# Patient Record
Sex: Male | Born: 1945
Health system: Southern US, Community
[De-identification: ages and names within clinical notes are randomized; demographics above are authoritative.]

## PROBLEM LIST (undated history)

## (undated) DIAGNOSIS — M1990 Unspecified osteoarthritis, unspecified site: Secondary | ICD-10-CM

## (undated) DIAGNOSIS — G8929 Other chronic pain: Secondary | ICD-10-CM

## (undated) DIAGNOSIS — R7303 Prediabetes: Secondary | ICD-10-CM

## (undated) DIAGNOSIS — K3189 Other diseases of stomach and duodenum: Secondary | ICD-10-CM

## (undated) DIAGNOSIS — M542 Cervicalgia: Secondary | ICD-10-CM

## (undated) DIAGNOSIS — J449 Chronic obstructive pulmonary disease, unspecified: Secondary | ICD-10-CM

## (undated) DIAGNOSIS — E785 Hyperlipidemia, unspecified: Secondary | ICD-10-CM

## (undated) DIAGNOSIS — Z87891 Personal history of nicotine dependence: Secondary | ICD-10-CM

## (undated) DIAGNOSIS — K52831 Collagenous colitis: Secondary | ICD-10-CM

## (undated) DIAGNOSIS — K760 Fatty (change of) liver, not elsewhere classified: Secondary | ICD-10-CM

## (undated) DIAGNOSIS — K449 Diaphragmatic hernia without obstruction or gangrene: Secondary | ICD-10-CM

## (undated) DIAGNOSIS — I10 Essential (primary) hypertension: Secondary | ICD-10-CM

## (undated) DIAGNOSIS — I2510 Atherosclerotic heart disease of native coronary artery without angina pectoris: Secondary | ICD-10-CM

## (undated) DIAGNOSIS — IMO0002 Reserved for concepts with insufficient information to code with codable children: Secondary | ICD-10-CM

## (undated) DIAGNOSIS — M25512 Pain in left shoulder: Secondary | ICD-10-CM

## (undated) DIAGNOSIS — E669 Obesity, unspecified: Secondary | ICD-10-CM

## (undated) DIAGNOSIS — K219 Gastro-esophageal reflux disease without esophagitis: Secondary | ICD-10-CM

## (undated) DIAGNOSIS — E66812 Obesity, class 2: Secondary | ICD-10-CM

## (undated) HISTORY — DX: Fatty (change of) liver, not elsewhere classified: K76.0

## (undated) HISTORY — DX: Diaphragmatic hernia without obstruction or gangrene: K44.9

## (undated) HISTORY — DX: Essential (primary) hypertension: I10

## (undated) HISTORY — DX: Obesity, class 2: E66.812

## (undated) HISTORY — DX: Atherosclerotic heart disease of native coronary artery without angina pectoris: I25.10

## (undated) HISTORY — DX: Pain in left shoulder: M25.512

## (undated) HISTORY — PX: COLONOSCOPY W/ POLYPECTOMY: SHX1380

## (undated) HISTORY — DX: Personal history of nicotine dependence: Z87.891

## (undated) HISTORY — DX: Other chronic pain: G89.29

## (undated) HISTORY — DX: Collagenous colitis: K52.831

## (undated) HISTORY — DX: Gastro-esophageal reflux disease without esophagitis: K21.9

## (undated) HISTORY — PX: CARPAL TUNNEL RELEASE: SHX101

## (undated) HISTORY — DX: Prediabetes: R73.03

## (undated) HISTORY — DX: Reserved for concepts with insufficient information to code with codable children: IMO0002

## (undated) HISTORY — DX: Chronic obstructive pulmonary disease, unspecified: J44.9

## (undated) HISTORY — PX: TONSILLECTOMY: SHX5217

## (undated) HISTORY — DX: Hyperlipidemia, unspecified: E78.5

## (undated) HISTORY — PX: CATARACT EXTRACTION W/ INTRAOCULAR LENS IMPLANT: SHX1309

## (undated) HISTORY — DX: Obesity, unspecified: E66.9

---

## 1999-11-21 ENCOUNTER — Ambulatory Visit (HOSPITAL_COMMUNITY): Admission: RE | Admit: 1999-11-21 | Discharge: 1999-11-21 | Payer: Self-pay | Admitting: *Deleted

## 2000-10-21 DIAGNOSIS — K52831 Collagenous colitis: Secondary | ICD-10-CM

## 2000-10-21 HISTORY — DX: Collagenous colitis: K52.831

## 2002-02-23 ENCOUNTER — Encounter: Payer: Self-pay | Admitting: Emergency Medicine

## 2002-02-23 ENCOUNTER — Emergency Department (HOSPITAL_COMMUNITY): Admission: EM | Admit: 2002-02-23 | Discharge: 2002-02-23 | Payer: Self-pay | Admitting: Emergency Medicine

## 2005-08-30 ENCOUNTER — Ambulatory Visit: Payer: Self-pay | Admitting: Internal Medicine

## 2005-09-05 ENCOUNTER — Ambulatory Visit: Payer: Self-pay | Admitting: Internal Medicine

## 2006-04-22 ENCOUNTER — Ambulatory Visit: Payer: Self-pay | Admitting: Internal Medicine

## 2007-02-03 ENCOUNTER — Ambulatory Visit: Payer: Self-pay | Admitting: Internal Medicine

## 2007-02-03 LAB — CONVERTED CEMR LAB
ALT: 16 units/L (ref 0–40)
AST: 15 units/L (ref 0–37)
Albumin: 3.9 g/dL (ref 3.5–5.2)
Alkaline Phosphatase: 63 units/L (ref 39–117)
BUN: 23 mg/dL (ref 6–23)
Basophils Absolute: 0 10*3/uL (ref 0.0–0.1)
Basophils Relative: 0.6 % (ref 0.0–1.0)
Bilirubin Urine: NEGATIVE
Bilirubin, Direct: 0.1 mg/dL (ref 0.0–0.3)
CO2: 31 meq/L (ref 19–32)
Calcium: 9.3 mg/dL (ref 8.4–10.5)
Chloride: 104 meq/L (ref 96–112)
Cholesterol: 219 mg/dL (ref 0–200)
Creatinine, Ser: 0.9 mg/dL (ref 0.4–1.5)
Direct LDL: 161.2 mg/dL
Eosinophils Absolute: 0.1 10*3/uL (ref 0.0–0.6)
Eosinophils Relative: 1.8 % (ref 0.0–5.0)
GFR calc Af Amer: 111 mL/min
GFR calc non Af Amer: 92 mL/min
Glucose, Bld: 93 mg/dL (ref 70–99)
HCT: 42.6 % (ref 39.0–52.0)
HDL: 39.8 mg/dL (ref >39.0)
Hemoglobin, Urine: NEGATIVE
Hemoglobin: 14.6 g/dL (ref 13.0–17.0)
Ketones, ur: NEGATIVE mg/dL
Leukocytes, UA: NEGATIVE
Lymphocytes Relative: 26.7 % (ref 12.0–46.0)
MCHC: 34.2 g/dL (ref 30.0–36.0)
MCV: 85.9 fL (ref 78.0–100.0)
Monocytes Absolute: 0.4 10*3/uL (ref 0.2–0.7)
Monocytes Relative: 6.3 % (ref 3.0–11.0)
Neutro Abs: 4.4 10*3/uL (ref 1.4–7.7)
Neutrophils Relative %: 64.6 % (ref 43.0–77.0)
Nitrite: NEGATIVE
PSA: 0.6 ng/mL (ref 0.10–4.00)
Platelets: 252 10*3/uL (ref 150–400)
Potassium: 4.4 meq/L (ref 3.5–5.1)
RBC: 4.96 M/uL (ref 4.22–5.81)
RDW: 13.2 % (ref 11.5–14.6)
Sodium: 139 meq/L (ref 135–145)
Specific Gravity, Urine: >=1.03 (ref 1.000–1.03)
TSH: 1.58 microintl units/mL (ref 0.35–5.50)
Total Bilirubin: 0.5 mg/dL (ref 0.3–1.2)
Total CHOL/HDL Ratio: 5.5
Total Protein, Urine: NEGATIVE mg/dL
Total Protein: 7 g/dL (ref 6.0–8.3)
Triglycerides: 153 mg/dL — ABNORMAL HIGH (ref 0–149)
Urine Glucose: NEGATIVE mg/dL
Urobilinogen, UA: 0.2 (ref 0.0–1.0)
VLDL: 31 mg/dL (ref 0–40)
WBC: 6.7 10*3/uL (ref 4.5–10.5)
pH: 5.5 (ref 5.0–8.0)

## 2007-08-18 ENCOUNTER — Ambulatory Visit: Payer: Self-pay | Admitting: Internal Medicine

## 2007-08-18 LAB — CONVERTED CEMR LAB
ALT: 21 units/L (ref 0–53)
AST: 19 units/L (ref 0–37)
Albumin: 4.3 g/dL (ref 3.5–5.2)
Alkaline Phosphatase: 58 units/L (ref 39–117)
BUN: 18 mg/dL (ref 6–23)
Basophils Absolute: 0 10*3/uL (ref 0.0–0.1)
Basophils Relative: 0.6 % (ref 0.0–1.0)
Bilirubin Urine: NEGATIVE
Bilirubin, Direct: 0.1 mg/dL (ref 0.0–0.3)
CO2: 31 meq/L (ref 19–32)
Calcium: 9.5 mg/dL (ref 8.4–10.5)
Chloride: 102 meq/L (ref 96–112)
Cholesterol: 173 mg/dL (ref 0–200)
Creatinine, Ser: 0.8 mg/dL (ref 0.4–1.5)
Eosinophils Absolute: 0.1 10*3/uL (ref 0.0–0.6)
Eosinophils Relative: 2.4 % (ref 0.0–5.0)
GFR calc Af Amer: 128 mL/min
GFR calc non Af Amer: 106 mL/min
Glucose, Bld: 86 mg/dL (ref 70–99)
HCT: 43.8 % (ref 39.0–52.0)
HDL: 46.3 mg/dL (ref >39.0)
Hemoglobin, Urine: NEGATIVE
Hemoglobin: 15.2 g/dL (ref 13.0–17.0)
Ketones, ur: NEGATIVE mg/dL
LDL Cholesterol: 105 mg/dL — ABNORMAL HIGH (ref 0–99)
Leukocytes, UA: NEGATIVE
Lymphocytes Relative: 31.7 % (ref 12.0–46.0)
MCHC: 34.7 g/dL (ref 30.0–36.0)
MCV: 88.1 fL (ref 78.0–100.0)
Monocytes Absolute: 0.5 10*3/uL (ref 0.2–0.7)
Monocytes Relative: 8 % (ref 3.0–11.0)
Neutro Abs: 3.6 10*3/uL (ref 1.4–7.7)
Neutrophils Relative %: 57.3 % (ref 43.0–77.0)
Nitrite: NEGATIVE
PSA: 0.54 ng/mL (ref 0.10–4.00)
Platelets: 265 10*3/uL (ref 150–400)
Potassium: 4.7 meq/L (ref 3.5–5.1)
RBC: 4.98 M/uL (ref 4.22–5.81)
RDW: 13.2 % (ref 11.5–14.6)
Sodium: 138 meq/L (ref 135–145)
Specific Gravity, Urine: >=1.03 (ref 1.000–1.03)
TSH: 1.55 microintl units/mL (ref 0.35–5.50)
Total Bilirubin: 0.6 mg/dL (ref 0.3–1.2)
Total CHOL/HDL Ratio: 3.7
Total Protein, Urine: NEGATIVE mg/dL
Total Protein: 7.4 g/dL (ref 6.0–8.3)
Triglycerides: 109 mg/dL (ref 0–149)
Urine Glucose: NEGATIVE mg/dL
Urobilinogen, UA: 0.2 (ref 0.0–1.0)
VLDL: 22 mg/dL (ref 0–40)
WBC: 6.2 10*3/uL (ref 4.5–10.5)
pH: 5.5 (ref 5.0–8.0)

## 2007-08-21 ENCOUNTER — Ambulatory Visit: Payer: Self-pay | Admitting: Internal Medicine

## 2007-08-21 ENCOUNTER — Encounter: Payer: Self-pay | Admitting: Internal Medicine

## 2007-08-21 DIAGNOSIS — K219 Gastro-esophageal reflux disease without esophagitis: Secondary | ICD-10-CM | POA: Insufficient documentation

## 2007-08-21 DIAGNOSIS — I2510 Atherosclerotic heart disease of native coronary artery without angina pectoris: Secondary | ICD-10-CM | POA: Insufficient documentation

## 2007-08-21 DIAGNOSIS — I10 Essential (primary) hypertension: Secondary | ICD-10-CM | POA: Insufficient documentation

## 2007-08-21 DIAGNOSIS — E785 Hyperlipidemia, unspecified: Secondary | ICD-10-CM | POA: Insufficient documentation

## 2007-08-21 HISTORY — DX: Gastro-esophageal reflux disease without esophagitis: K21.9

## 2007-10-21 ENCOUNTER — Ambulatory Visit: Payer: Self-pay | Admitting: Internal Medicine

## 2009-03-23 ENCOUNTER — Encounter: Payer: Self-pay | Admitting: Internal Medicine

## 2009-03-24 ENCOUNTER — Ambulatory Visit: Payer: Self-pay | Admitting: Internal Medicine

## 2009-03-25 LAB — CONVERTED CEMR LAB
ALT: 30 units/L (ref 0–53)
AST: 25 units/L (ref 0–37)
Albumin: 4.1 g/dL (ref 3.5–5.2)
Alkaline Phosphatase: 82 units/L (ref 39–117)
BUN: 18 mg/dL (ref 6–23)
Basophils Absolute: 0.1 10*3/uL (ref 0.0–0.1)
Basophils Relative: 0.7 % (ref 0.0–3.0)
Bilirubin Urine: NEGATIVE
Bilirubin, Direct: 0.1 mg/dL (ref 0.0–0.3)
CO2: 31 meq/L (ref 19–32)
Calcium: 9.1 mg/dL (ref 8.4–10.5)
Chloride: 109 meq/L (ref 96–112)
Cholesterol: 178 mg/dL (ref 0–200)
Creatinine, Ser: 0.9 mg/dL (ref 0.4–1.5)
Eosinophils Absolute: 0.1 10*3/uL (ref 0.0–0.7)
Eosinophils Relative: 1.7 % (ref 0.0–5.0)
GFR calc non Af Amer: 91.32 mL/min (ref >60)
Glucose, Bld: 92 mg/dL (ref 70–99)
HCT: 40.6 % (ref 39.0–52.0)
HDL: 46.2 mg/dL (ref >39.00)
Hemoglobin, Urine: NEGATIVE
Hemoglobin: 14.4 g/dL (ref 13.0–17.0)
Ketones, ur: NEGATIVE mg/dL
LDL Cholesterol: 106 mg/dL — ABNORMAL HIGH (ref 0–99)
Leukocytes, UA: NEGATIVE
Lymphocytes Relative: 26.3 % (ref 12.0–46.0)
Lymphs Abs: 2.2 10*3/uL (ref 0.7–4.0)
MCHC: 35.6 g/dL (ref 30.0–36.0)
MCV: 85.7 fL (ref 78.0–100.0)
Monocytes Absolute: 0.8 10*3/uL (ref 0.1–1.0)
Monocytes Relative: 10.2 % (ref 3.0–12.0)
Neutro Abs: 5 10*3/uL (ref 1.4–7.7)
Neutrophils Relative %: 61.1 % (ref 43.0–77.0)
Nitrite: NEGATIVE
PSA: 0.52 ng/mL (ref 0.10–4.00)
Platelets: 209 10*3/uL (ref 150.0–400.0)
Potassium: 4.4 meq/L (ref 3.5–5.1)
RBC: 4.73 M/uL (ref 4.22–5.81)
RDW: 13.1 % (ref 11.5–14.6)
Sodium: 142 meq/L (ref 135–145)
Specific Gravity, Urine: >=1.03 (ref 1.000–1.030)
TSH: 1.11 microintl units/mL (ref 0.35–5.50)
Total Bilirubin: 0.8 mg/dL (ref 0.3–1.2)
Total CHOL/HDL Ratio: 4
Total Protein, Urine: NEGATIVE mg/dL
Total Protein: 7.5 g/dL (ref 6.0–8.3)
Triglycerides: 127 mg/dL (ref 0.0–149.0)
Urine Glucose: NEGATIVE mg/dL
Urobilinogen, UA: 0.2 (ref 0.0–1.0)
VLDL: 25.4 mg/dL (ref 0.0–40.0)
WBC: 8.2 10*3/uL (ref 4.5–10.5)
pH: 5 (ref 5.0–8.0)

## 2009-04-06 ENCOUNTER — Telehealth (INDEPENDENT_AMBULATORY_CARE_PROVIDER_SITE_OTHER): Payer: Self-pay | Admitting: *Deleted

## 2009-04-10 ENCOUNTER — Ambulatory Visit: Payer: Self-pay

## 2009-04-10 ENCOUNTER — Encounter: Payer: Self-pay | Admitting: Internal Medicine

## 2009-06-22 ENCOUNTER — Telehealth: Payer: Self-pay | Admitting: Internal Medicine

## 2009-10-19 ENCOUNTER — Ambulatory Visit: Payer: Self-pay | Admitting: Internal Medicine

## 2009-10-21 HISTORY — PX: COLONOSCOPY W/ POLYPECTOMY: SHX1380

## 2010-04-13 ENCOUNTER — Ambulatory Visit: Payer: Self-pay | Admitting: Internal Medicine

## 2010-04-13 LAB — CONVERTED CEMR LAB
ALT: 18 units/L (ref 0–53)
AST: 17 units/L (ref 0–37)
Albumin: 4.3 g/dL (ref 3.5–5.2)
Alkaline Phosphatase: 76 units/L (ref 39–117)
BUN: 21 mg/dL (ref 6–23)
Basophils Absolute: 0 10*3/uL (ref 0.0–0.1)
Basophils Relative: 0.6 % (ref 0.0–3.0)
Bilirubin Urine: NEGATIVE
Bilirubin, Direct: 0.1 mg/dL (ref 0.0–0.3)
CO2: 31 meq/L (ref 19–32)
Calcium: 9.1 mg/dL (ref 8.4–10.5)
Chloride: 106 meq/L (ref 96–112)
Cholesterol: 233 mg/dL — ABNORMAL HIGH (ref 0–200)
Creatinine, Ser: 0.9 mg/dL (ref 0.4–1.5)
Direct LDL: 173 mg/dL
Eosinophils Absolute: 0.1 10*3/uL (ref 0.0–0.7)
Eosinophils Relative: 1.7 % (ref 0.0–5.0)
GFR calc non Af Amer: 93.39 mL/min (ref >60)
Glucose, Bld: 94 mg/dL (ref 70–99)
HCT: 42.5 % (ref 39.0–52.0)
HDL: 45 mg/dL (ref >39.00)
Hemoglobin, Urine: NEGATIVE
Hemoglobin: 14.7 g/dL (ref 13.0–17.0)
Ketones, ur: NEGATIVE mg/dL
Leukocytes, UA: NEGATIVE
Lymphocytes Relative: 26.1 % (ref 12.0–46.0)
Lymphs Abs: 1.8 10*3/uL (ref 0.7–4.0)
MCHC: 34.6 g/dL (ref 30.0–36.0)
MCV: 86.8 fL (ref 78.0–100.0)
Monocytes Absolute: 0.5 10*3/uL (ref 0.1–1.0)
Monocytes Relative: 7.3 % (ref 3.0–12.0)
Neutro Abs: 4.5 10*3/uL (ref 1.4–7.7)
Neutrophils Relative %: 64.3 % (ref 43.0–77.0)
Nitrite: NEGATIVE
PSA: 0.68 ng/mL (ref 0.10–4.00)
Platelets: 237 10*3/uL (ref 150.0–400.0)
Potassium: 4.7 meq/L (ref 3.5–5.1)
RBC: 4.89 M/uL (ref 4.22–5.81)
RDW: 14.3 % (ref 11.5–14.6)
Sodium: 141 meq/L (ref 135–145)
Specific Gravity, Urine: >=1.03 (ref 1.000–1.030)
TSH: 1 microintl units/mL (ref 0.35–5.50)
Total Bilirubin: 0.6 mg/dL (ref 0.3–1.2)
Total CHOL/HDL Ratio: 5
Total Protein, Urine: NEGATIVE mg/dL
Total Protein: 7.7 g/dL (ref 6.0–8.3)
Triglycerides: 233 mg/dL — ABNORMAL HIGH (ref 0.0–149.0)
Urine Glucose: NEGATIVE mg/dL
Urobilinogen, UA: 0.2 (ref 0.0–1.0)
VLDL: 46.6 mg/dL — ABNORMAL HIGH (ref 0.0–40.0)
WBC: 7 10*3/uL (ref 4.5–10.5)
pH: 5.5 (ref 5.0–8.0)

## 2010-04-19 ENCOUNTER — Ambulatory Visit: Payer: Self-pay | Admitting: Internal Medicine

## 2010-04-19 DIAGNOSIS — H9190 Unspecified hearing loss, unspecified ear: Secondary | ICD-10-CM | POA: Insufficient documentation

## 2010-04-25 ENCOUNTER — Encounter (INDEPENDENT_AMBULATORY_CARE_PROVIDER_SITE_OTHER): Payer: Self-pay | Admitting: *Deleted

## 2010-05-11 ENCOUNTER — Encounter (INDEPENDENT_AMBULATORY_CARE_PROVIDER_SITE_OTHER): Payer: Self-pay | Admitting: *Deleted

## 2010-05-14 ENCOUNTER — Ambulatory Visit: Payer: Self-pay | Admitting: Internal Medicine

## 2010-05-30 ENCOUNTER — Telehealth: Payer: Self-pay | Admitting: Internal Medicine

## 2010-05-31 ENCOUNTER — Ambulatory Visit: Payer: Self-pay | Admitting: Internal Medicine

## 2010-05-31 LAB — HM COLONOSCOPY

## 2010-06-04 ENCOUNTER — Encounter: Payer: Self-pay | Admitting: Internal Medicine

## 2010-09-21 ENCOUNTER — Encounter: Payer: Self-pay | Admitting: Internal Medicine

## 2010-09-21 ENCOUNTER — Ambulatory Visit: Payer: Self-pay | Admitting: Internal Medicine

## 2010-09-25 ENCOUNTER — Telehealth: Payer: Self-pay | Admitting: Internal Medicine

## 2010-11-08 ENCOUNTER — Telehealth: Payer: Self-pay | Admitting: Family Medicine

## 2010-11-19 ENCOUNTER — Other Ambulatory Visit: Payer: Self-pay | Admitting: Family Medicine

## 2010-11-19 ENCOUNTER — Telehealth: Payer: Self-pay | Admitting: Family Medicine

## 2010-11-19 ENCOUNTER — Encounter: Payer: Self-pay | Admitting: Family Medicine

## 2010-11-19 ENCOUNTER — Ambulatory Visit
Admission: RE | Admit: 2010-11-19 | Discharge: 2010-11-19 | Payer: Self-pay | Source: Home / Self Care | Attending: Family Medicine | Admitting: Family Medicine

## 2010-11-19 LAB — HEPATIC FUNCTION PANEL
ALT: 17 U/L (ref 0–53)
AST: 17 U/L (ref 0–37)
Alkaline Phosphatase: 66 U/L (ref 39–117)
Bilirubin, Direct: 0.1 mg/dL (ref 0.0–0.3)
Total Bilirubin: 0.4 mg/dL (ref 0.3–1.2)

## 2010-11-19 LAB — LIPID PANEL: VLDL: 33.6 mg/dL (ref 0.0–40.0)

## 2010-11-19 LAB — BASIC METABOLIC PANEL
BUN: 18 mg/dL (ref 6–23)
GFR: 90.82 mL/min (ref >60.00)
Glucose, Bld: 91 mg/dL (ref 70–99)
Potassium: 4.3 mEq/L (ref 3.5–5.1)

## 2010-11-20 NOTE — Miscellaneous (Signed)
Summary: screening colon age--ch.  Clinical Lists Changes  Medications: Added new medication of MOVIPREP 100 GM  SOLR (PEG-KCL-NACL-NASULF-NA ASC-C) As directed - Signed Rx of MOVIPREP 100 GM  SOLR (PEG-KCL-NACL-NASULF-NA ASC-C) As directed;  #1 x 0;  Signed;  Entered by: Clide Cliff RN;  Authorized by: Iva Boop MD, [facility];  Method used: [facility]  [place] [address], [place], [place]  [address], Ph: [phone] or [phone], Fax: [phone] Observations: Added new observation of ALLERGY REV: Done (05/14/2010 14:55)    Prescriptions: MOVIPREP 100 GM  SOLR (PEG-KCL-NACL-NASULF-NA ASC-C) As directed  #1 x 0   Entered by:   Clide Cliff RN   Authorized by:   Iva Boop MD, [facility]   Signed by:   Clide Cliff RN on 05/14/2010   Method used:   [facility]        [facility]  [place] [address] (retail)       [address]       [place], [place]  [address]       Ph: [phone] or [phone]       Fax: [phone]   RxID:   [id number]

## 2010-11-20 NOTE — Progress Notes (Signed)
Summary: ? re prep  Phone Note Call from Patient Call back at Home Phone [phone]   Caller: Patient Call For: Leone Payor Reason for Call: Talk to Nurse Summary of Call: Patient is having procedure tomorrow and has questions regarding prep Initial call taken by: Tawni Levy,  May 30, 2010 12:10 PM  Follow-up for Phone Call        Phone call from pt.  He can not pick up the prep until later today.  He asked if he could add ice to the prep after it is mixed.  He said he knows he will drink it better cold than luke warm.  I told him it would be okay.  To make sure he gets the proper amount of water in the prep.  Pt states he understands, no further questions. Follow-up by: Eual Fines RN,  May 30, 2010 12:22 PM

## 2010-11-20 NOTE — Assessment & Plan Note (Signed)
Summary: FU--BP: 160/90--FLUCUATED--145/80-D/T---STC   Vital Signs:  Patient profile:   65 year old male Height:      72 inches Weight:      252.25 pounds BMI:     34.33 O2 Sat:      96 % on Room air Temp:     99.2 degrees F oral Pulse rate:   65 / minute BP sitting:   128 / 72  (left arm) Cuff size:   large  Vitals Entered By: Zella Ball Ewing CMA Duncan Dull) (September 21, 2010 9:39 AM)  O2 Flow:  Room air  CC: BP Elevated/RE   CC:  BP Elevated/RE.  History of Present Illness: here to f/u - BP at home has been 140 to 170, bottom number 74 to 98 daily for months; Pt denies  worsening sob, doe, wheezing, orthopnea, pnd, worsening LE edema, palps, dizziness or syncope Pt denies new neuro symptoms such as headache, facial or extremity weakness  Pt denies polydipsia, polyuria  Overall good compliance with meds, trying to follow low chol, diet, wt stable, little excercise however   had to stop the ASA due to twice weekly nose bleed , but no fever, headache, sinus pain, pressure or other d/c. No ST , cough.   also not sleeping well with waking up 3to 4 times per night;  but Denies worsening depressive symptoms, suicidal ideation, or panic.    also with several episodes of SSCP , the last 3 wks ago, mod to severe, with sweaty, sob , no n/v, palps or dizziness, no radiation, lasted , no recurrence, had some emotional stress at this time, since then non recurrence and no exertional pains.    also with recent mild occasinal myalgias, currentlyon simvastatin  Preventive Screening-Counseling & Management      Drug Use:  no.    Problems Prior to Update: 1)  Epistaxis, Recurrent  (ICD-784.7) 2)  Chest Pain  (ICD-786.50) 3)  Unspecified Hearing Loss  (ICD-389.9) 4)  Wheezing  (ICD-786.07) 5)  Chest Pain  (ICD-786.50) 6)  Family History Colonic Polyps  (ICD-V18.51) 7)  Acute Bronchitis  (ICD-466.0) 8)  Rotator Cuff Injury, Right Shoulder  (ICD-726.10) 9)  Preventive Health Care   (ICD-V70.0) 10)  Collagenous Colitis  (ICD-710.9) 11)  Coronary Artery Disease  (ICD-414.00) 12)  Gerd  (ICD-530.81) 13)  Hypertension  (ICD-401.9) 14)  Hyperlipidemia  (ICD-272.4) 15)  Family History Diabetes 1st Degree Relative  (ICD-V18.0) 16)  Family History of Cad Male 1st Degree Relative <50  (ICD-V17.3) 17)  Family History of Cad Male 1st Degree Relative <60  (ICD-V16.49)  Medications Prior to Update: 1)  Lisinopril 20 Mg Tabs (Lisinopril) .[name].. 1po  Once Daily 2)  Simvastatin 40 Mg  Tabs (Simvastatin) .[name].. 1 By Mouth Once Daily 3)  Ecotrin Low Strength 81 Mg  Tbec (Aspirin) .[name].. 1 Po Qd  Current Medications (verified): 1)  Lisinopril-Hydrochlorothiazide 20-12.5 Mg Tabs (Lisinopril-Hydrochlorothiazide) .[name].. 1 By Mouth Once Daily 2)  Lipitor 20 Mg Tabs (Atorvastatin Calcium) .[name].. 1po Once Daily  - Generic 3)  Ecotrin Low Strength 81 Mg  Tbec (Aspirin) .[name].. 1 Po Qd  Allergies (verified): 1)  ! Penicillin 2)  ! Sulfa  Past History:  Past Medical History: Last updated: 08/21/2007 Hyperlipidemia Hypertension GERD chronic left shoulder pain - ? 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   Head:  normocephalic and atraumatic.   Eyes:  vision  grossly intact, pupils equal, and pupils round.   Ears:  R ear normal and L ear normal.  after ear irrigation Nose:  no external deformity and no nasal discharge.   Mouth:  no gingival abnormalities and pharynx pink and moist.   Neck:  supple and no masses.   Lungs:  normal respiratory effort and normal breath sounds.   Heart:  normal rate and regular rhythm.   Abdomen:  soft, non-tender, and normal bowel sounds.   Msk:  no joint tenderness and no joint swelling.   Extremities:  no edema, no erythema  Neurologic:  cranial nerves II-XII intact and strength normal in all extremities.     Impression & Recommendations:  Problem # 1:  HYPERTENSION (ICD-401.9)  His updated medication list for this problem includes:    Lisinopril-hydrochlorothiazide 20-12.5 Mg Tabs (Lisinopril-hydrochlorothiazide) .[name]... 1 by mouth once daily  BP today: 128/72 Prior BP: 120/62 (04/19/2010)  Labs Reviewed: K+: 4.7 (04/13/2010) Creat: : 0.9 (04/13/2010)   Chol: 233 (04/13/2010)   HDL: 45.00 (04/13/2010)   LDL: 106 (03/24/2009)   TG: 233.0 (04/13/2010) treat as above, f/u any worsening signs or symptoms  - hct added for better control  Problem # 2:  CHEST PAIN (ICD-786.50) atypical , stable overall now, ecg reivewd, pt declines f/u stress test or card eval or cxr Orders: EKG w/ Interpretation ([address])  Problem # 3:  EPISTAXIS, RECURRENT (ICD-784.7)  to ent, then to re-start the asa hopefully after that  Orders: ENT Referral (ENT)  Problem # 4:  HYPERLIPIDEMIA (ICD-272.4)  His updated medication list for this problem includes:    Lipitor 20 Mg Tabs (Atorvastatin calcium) .[name]... 1po once daily  - generic ? recent myalgias related to simvastatin - ok to change, with f/u labs   Labs Reviewed: SGOT: 17 (04/13/2010)   SGPT: 18 (04/13/2010)   HDL:45.00 (04/13/2010), 46.20 (03/24/2009)  LDL:106 (03/24/2009), 105 (08/18/2007)  Chol:233 (04/13/2010), 178 (03/24/2009)  Trig:233.0 (04/13/2010), 127.0  (03/24/2009)  Complete Medication List: 1)  Lisinopril-hydrochlorothiazide 20-12.5 Mg Tabs (Lisinopril-hydrochlorothiazide) .[name].. 1 by mouth once daily 2)  Lipitor 20 Mg Tabs (Atorvastatin calcium) .[name].. 1po once daily  - generic 3)  Ecotrin Low Strength 81 Mg Tbec (Aspirin) .[name].. 1 po qd  Patient Instructions: 1)  stop the lisinopril 2)  start the lisinopril -HCT - 1 per day 3)  You will be contacted about the referral(s) to: ENT 4)  Your EKG was ok today 5)  Continue all previous medications as before this visit , except hold on the Aspirin for now , but you should start again after ENT treatment 6)  stop the simvastatin 7)  start the lipitor 20 mg per day 8)  Please schedule a follow-up appointment in 6 months for CPX with labs Prescriptions: LIPITOR 20 MG TABS (ATORVASTATIN CALCIUM) 1po once daily  - generic  #90 x 3   Entered and Authorized by:  Corwin Levins MD   Signed by:   Corwin Levins MD on 09/21/2010   Method used:   Print then Give to Patient   RxID:   [id number] LISINOPRIL-HYDROCHLOROTHIAZIDE 20-12.5 MG TABS (LISINOPRIL-HYDROCHLOROTHIAZIDE) 1 by mouth once daily  #90 x 3   Entered and Authorized by:   Corwin Levins MD   Signed by:   Corwin Levins MD on 09/21/2010   Method used:   Print then Give to Patient   RxID:   [id number]    Orders Added: 1)  EKG w/ Interpretation [[address]] 2)  ENT Referral [ENT] 3)  Est. Patient Level IV [01027]

## 2010-11-20 NOTE — Progress Notes (Signed)
Summary: Allen [name]   Phone Note Call from Patient   Summary of Call: Lost rx's at last office visit. Req to be called into local pharm & called back when completed. Initial call taken by: Lamar Sprinkles, CMA,  September 25, 2010 10:54 AM  Follow-up for Phone Call        Notified pt spoke with wife [facility] Follow-up by: Orlan Leavens RMA,  September 25, 2010 11:22 AM    Prescriptions: LIPITOR 20 MG TABS (ATORVASTATIN CALCIUM) 1po once daily  - generic  #90 x 3   Entered by:   Lamar Sprinkles, CMA   Authorized by:   Corwin Levins MD   Signed by:   Lamar Sprinkles, CMA on 09/25/2010   Method used:   [facility]        [facility]  [place] [address]       [address]       [place], [place]  [address]       Ph: [phone] or [phone]       Fax: [phone]   RxID:   [id number] LISINOPRIL-HYDROCHLOROTHIAZIDE 20-12.5 MG TABS (LISINOPRIL-HYDROCHLOROTHIAZIDE) 1 by mouth once daily  #90 x 3   Entered by:   Lamar Sprinkles, CMA   Authorized by:   Corwin Levins MD   Signed by:   Lamar Sprinkles, CMA on 09/25/2010   Method used:   [facility]        [facility]  [place] [address] [facility]       [address]       [place], [place]  [address]       Ph: [phone] or [phone]       Fax: [phone]   RxID:   [id number]

## 2010-11-20 NOTE — Procedures (Signed)
Summary: Colonoscopy  Patient: Allen [name] Note: All result statuses are Final unless otherwise noted.  Tests: (1) Colonoscopy (COL)   COL Colonoscopy           [facility]ONE      [facility]     [address]     [place], [place]  [address]           COLONOSCOPY PROCEDURE REPORT           PATIENT:  Allen, [name]  MR#:  [id number]     BIRTHDATE:  May 01, 1946, 63 yrs. old  GENDER:  male     ENDOSCOPIST:  Iva Boop, MD, [facility]     REF. BY:  Oliver Barre, M.D.     PROCEDURE DATE:  05/31/2010     PROCEDURE:  Colonoscopy with biopsy and snare polypectomy     ASA CLASS:  Class II     INDICATIONS:  Elevated Risk Screening, family history of colon     cancer father <65     MEDICATIONS:   Fentanyl 50 mcg IV, Versed 6 mg IV           DESCRIPTION OF PROCEDURE:   After the risks benefits and     alternatives of the procedure were thoroughly explained, informed     consent was obtained.  Digital rectal exam was performed and     revealed no abnormalities and normal prostate.   The [id number]     endoscope was introduced through the anus and advanced to the     cecum, which was identified by both the appendix and ileocecal     valve, without limitations.  The quality of the prep was     excellent, using MoviPrep.  The instrument was then slowly     withdrawn as the colon was fully examined.insertion: 1:55 minutes     Withdrawal: 9:37 minutes     <<PROCEDUREIMAGES>>           FINDINGS:  Two polyps were found in the rectum and sigmoid colon.     They were diminutive. The polyp was removed using cold biopsy     forceps. Polyp was snared without cautery. Retrieval was     successful. Moderate diverticulosis was found in the sigmoid     colon.  This was otherwise a normal examination of the colon.     Retroflexed views in the rectum revealed no abnormalities.    The     scope was then withdrawn from the patient and the procedure     completed.           COMPLICATIONS:  None  ENDOSCOPIC IMPRESSION:     1) Two diminutive polyps in the rectum and sigmoid colon -     removed     2) Moderate diverticulosis in the sigmoid colon     3) Otherwise normal examination, excellent prep           REPEAT EXAM:  In for Colonoscopy, pending biopsy results.           Iva Boop, MD, [facility]           CC:  Corwin Levins, MD     The Patient           n.     Rosalie Doctor:   Iva Boop at 05/31/2010 12:30 PM           Boykin Nearing, [phone]  Note: An exclamation mark (!) indicates a result that  was not dispersed into the flowsheet. Document Creation Date: 05/31/2010 12:31 PM _______________________________________________________________________  (1) Order result status: Final Collection or observation date-time: 05/31/2010 12:22 Requested date-time:  Receipt date-time:  Reported date-time:  Referring Physician:   Ordering Physician: Stan Head [id number]) Specimen Source:  Source: Launa Grill Order Number: [id number] Lab site:   Appended Document: Colonoscopy   Appended Document: Colonoscopy     Procedures Next Due Date:    Colonoscopy: 05/2015

## 2010-11-20 NOTE — Assessment & Plan Note (Signed)
Summary: 6 mos f/u #/cd   Vital Signs:  Patient profile:   65 year old male Height:      72 inches Weight:      248 pounds BMI:     33.76 O2 Sat:      97 % on Room air Temp:     98.4 degrees F oral Pulse rate:   65 / minute BP sitting:   120 / 62  (left arm) Cuff size:   large  Vitals Entered By: Zella Ball Ewing CMA (AAMA) (April 19, 2010 8:25 AM)  O2 Flow:  Room air  Preventive Care Screening     declines pneumonvax;    CC: 6 month followup/RE   CC:  6 month followup/RE.  History of Present Illness: here to f/u; had recent ST - [facility]  now improved;  unfort gained 20 lbs with worse diet and less active; not taking the asa and statin but states he will do  better;  does try to walk some with walk every other day; Pt denies  cp, sob, doe, wheezing, orthopnea, pnd, worsening LE edema, palps, dizziness or syncope .  No hypersomnia.    Problems Prior to Update: 1)  Unspecified Hearing Loss  (ICD-389.9) 2)  Wheezing  (ICD-786.07) 3)  Chest Pain  (ICD-786.50) 4)  Family History Colonic Polyps  (ICD-V18.51) 5)  Acute Bronchitis  (ICD-466.0) 6)  Rotator Cuff Injury, Right Shoulder  (ICD-726.10) 7)  Preventive Health Care  (ICD-V70.0) 8)  Collagenous Colitis  (ICD-710.9) 9)  Coronary Artery Disease  (ICD-414.00) 10)  Gerd  (ICD-530.81) 11)  Hypertension  (ICD-401.9) 12)  Hyperlipidemia  (ICD-272.4) 13)  Family History Diabetes 1st Degree Relative  (ICD-V18.0) 14)  Family History of Cad Male 1st Degree Relative <50  (ICD-V17.3) 15)  Family History of Cad Male 1st Degree Relative <60  (ICD-V16.49)  Medications Prior to Update: 1)  Lisinopril 20 Mg Tabs (Lisinopril) .[name].. 1po  Once Daily 2)  Simvastatin 40 Mg  Tabs (Simvastatin) .[name].. 1 By Mouth Once Daily 3)  Ecotrin Low Strength 81 Mg  Tbec (Aspirin) .[name].. 1 Po Qd 4)  Clarithromycin 500 Mg Tabs (Clarithromycin) .[name].. 1 By Mouth Two Times A Day 5)  Hydrocodone-Homatropine 5-1.5 Mg/60ml Syrp  (Hydrocodone-Homatropine) .[name].. 1 Tsp By Mouth Q 6 Hrs As Needed Cough  Current Medications (verified): 1)  Lisinopril 20 Mg Tabs (Lisinopril) .[name].. 1po  Once Daily 2)  Simvastatin 40 Mg  Tabs (Simvastatin) .[name].. 1 By Mouth Once Daily 3)  Ecotrin Low Strength 81 Mg  Tbec (Aspirin) .[name].. 1 Po Qd  Allergies (verified): 1)  ! Penicillin 2)  ! Sulfa  Past History:  Past Medical History: Last updated: 08/21/2007 Hyperlipidemia Hypertension GERD chronic left shoulder pain - ? 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   Head:  normocephalic and atraumatic.   Eyes:  vision grossly intact, pupils equal, and pupils round.   Ears:  R ear normal and L ear normal.  after ear irrigation Nose:  no external deformity and no nasal discharge.   Mouth:  no gingival abnormalities and pharynx pink and moist.   Neck:  supple and no masses.   Lungs:  normal respiratory effort and normal breath sounds.   Heart:  normal rate and regular rhythm.   Abdomen:  soft, non-tender, and normal bowel sounds.   Msk:  no joint tenderness and no joint swelling.     Extremities:  no edema, no erythema  Neurologic:  cranial nerves II-XII intact and strength normal in all extremities.   Skin:  color normal and no rashes.   Psych:  normally interactive and good eye contact.     Impression & Recommendations:  Problem # 1:  Preventive Health Care (ICD-V70.0)  Overall doing well, age appropriate education and counseling updated and referral for appropriate preventive services done unless declined, immunizations up to date or declined, diet counseling done if overweight, urged to quit smoking if smokes , most recent labs reviewed and current ordered if appropriate, ecg reviewed or declined (interpretation per ECG scanned in the EMR if done); information regarding Medicare Prevention requirements given if appropriate; speciality referrals updated as appropriate   Orders: EKG w/ Interpretation ([address]) Gastroenterology Referral (GI)  Problem # 2:  UNSPECIFIED HEARING LOSS (ICD-389.9) for bilat ear irrigation  Problem # 3:  HYPERLIPIDEMIA (ICD-272.4)  His updated medication list for this problem includes:    Simvastatin 40 Mg Tabs (Simvastatin) .[name]... 1 by mouth once daily  Labs Reviewed: SGOT: 17 (04/13/2010)   SGPT: 18 (04/13/2010)   HDL:45.00 (04/13/2010), 46.20 (03/24/2009)  LDL:106 (03/24/2009), 105 (08/18/2007)  Chol:233 (04/13/2010), 178 (03/24/2009)  Trig:233.0 (04/13/2010), 127.0 (03/24/2009) to re-start the statin , and asa   Problem # 4:  HYPERTENSION (ICD-401.9)  His updated medication list for this problem includes:    Lisinopril 20 Mg Tabs (Lisinopril) .[name]... 1po  once daily  BP today: 120/62 Prior BP: 122/70 (10/19/2009)  Labs Reviewed: K+: 4.7 (04/13/2010) Creat: : 0.9 (04/13/2010)   Chol: 233 (04/13/2010)   HDL: 45.00 (04/13/2010)   LDL: 106 (03/24/2009)   TG: 233.0 (04/13/2010) stable overall by hx and exam, ok to continue meds/tx as is   Complete Medication List: 1)  Lisinopril 20 Mg Tabs (Lisinopril) .[name].. 1po  once daily 2)   Simvastatin 40 Mg Tabs (Simvastatin) .[name].. 1 by mouth once daily 3)  Ecotrin Low Strength 81 Mg Tbec (Aspirin) .[name].. 1 po qd  Patient Instructions: 1)  Plesae remember to take all of your medications 2)  Continue all previous medications as before this visit  3)  Take an Aspirin every day - 81 mg - 1 per day - COATED only 4)  You will be contacted about the referral(s) to: colonoscopy 5)  Please schedule a follow-up appointment in 1 year or sooner if needed Prescriptions: SIMVASTATIN 40 MG  TABS (SIMVASTATIN) 1 by mouth once daily  #90 x 3   Entered and Authorized by:   Corwin Levins MD   Signed by:   Corwin Levins MD on 04/19/2010   Method used:   Print then Give to Patient   RxID:   [id number] LISINOPRIL 20 MG  TABS (LISINOPRIL) 1po  once daily  #90 x 3   Entered and Authorized by:   Corwin Levins MD   Signed by:   Corwin Levins MD on 04/19/2010   Method used:   Print then Give to Patient   RxID:   [id number]

## 2010-11-20 NOTE — Letter (Signed)
Summary: Patient Notice-Hyperplastic Polyps  [facility]  [address] [place], [place] [address]   Phone: [phone]  Fax: [phone]        June 04, 2010 MRN: [id number]    Allen [name] [address] [place], [place]  [address]    Dear [name],  I am pleased to inform you that the colon polyps removed during your recent colonoscopy were found to be hyperplastic.  These types of polyps are NOT pre-cancerous.  Since you have a significant family history of colon cancer, I recommend that you have a repeat colonoscopy examination in 5 years for routine colorectal cancer screening.  Should you develop new or worsening symptoms of abdominal pain, bowel habit changes or bleeding from the rectum or bowels, please schedule an evaluation with either your primary care physician or with me.  Please call us if you are having persistent problems or have questions about your condition that have not been fully answered at this time.   Sincerely,  Iva Boop MD, [facility] This letter has been electronically signed by your physician.  Appended Document: Patient Notice-Hyperplastic Polyps letter mailed

## 2010-11-20 NOTE — Letter (Signed)
Summary: [facility] Instructions  [facility]  [address] [place], [place] [address]   Phone: [phone]  Fax: [phone]       Allen [name]    November 15, 1945    MRN: [id number]        Procedure Day /Date:  05/31/10  Thursday     Arrival Time:  9:30am      Procedure Time:  10:30am     Location of Procedure:                    _x _  [facility] (4th Floor)   PREPARATION FOR COLONOSCOPY WITH MOVIPREP   Starting 5 days prior to your procedure _8/6/11 do not eat nuts, seeds, popcorn, corn, beans, peas,  salads, or any raw vegetables.  Do not take any fiber supplements (e.g. Metamucil, Citrucel, and Benefiber).  THE DAY BEFORE YOUR PROCEDURE         DATE:   05/30/10  DAY:  Wednesday  1.  Drink clear liquids the entire day-NO SOLID FOOD  2.  Do not drink anything colored red or purple.  Avoid juices with pulp.  No orange juice.  3.  Drink at least 64 oz. (8 glasses) of fluid/clear liquids during the day to prevent dehydration and help the prep work efficiently.  CLEAR LIQUIDS INCLUDE: Water Jello Ice Popsicles Tea (sugar ok, no milk/cream) Powdered fruit flavored drinks Coffee (sugar ok, no milk/cream) Gatorade Juice: apple, white grape, white cranberry  Lemonade Clear bullion, consomm, broth Carbonated beverages (any kind) Strained chicken noodle soup Hard Candy                             4.  In the morning, mix first dose of MoviPrep solution:    Empty 1 Pouch A and 1 Pouch B into the disposable container    Add lukewarm drinking water to the top line of the container. Mix to dissolve    Refrigerate (mixed solution should be used within 24 hrs)  5.  Begin drinking the prep at 5:00 p.m. The MoviPrep container is divided by 4 marks.   Every 15 minutes drink the solution down to the next mark (approximately 8 oz) until the full liter is complete.   6.  Follow completed prep with 16 oz of clear liquid of your choice (Nothing red or  purple).  Continue to drink clear liquids until bedtime.  7.  Before going to bed, mix second dose of MoviPrep solution:    Empty 1 Pouch A and 1 Pouch B into the disposable container    Add lukewarm drinking water to the top line of the container. Mix to dissolve    Refrigerate  THE DAY OF YOUR PROCEDURE      DATE:   05/31/10  DAY:  Thursday  Beginning at  5:30am  (5 hours before procedure):         1. Every 15 minutes, drink the solution down to the next mark (approx 8 oz) until the full liter is complete.  2. Follow completed prep with 16 oz. of clear liquid of your choice.    3. You may drink clear liquids until  8:30am  (2 HOURS BEFORE PROCEDURE).   MEDICATION INSTRUCTIONS  Unless otherwise instructed, you should take regular prescription medications with a small sip of water   as early as possible the morning of your procedure.  OTHER INSTRUCTIONS  You will need a responsible adult at least 63 years of age to accompany you and drive you home.   This person must remain in the waiting room during your procedure.  Wear loose fitting clothing that is easily removed.  Leave jewelry and other valuables at home.  However, you may wish to bring a book to read or  an iPod/MP3 player to listen to music as you wait for your procedure to start.  Remove all body piercing jewelry and leave at home.  Total time from sign-in until discharge is approximately 2-3 hours.  You should go home directly after your procedure and rest.  You can resume normal activities the  day after your procedure.  The day of your procedure you should not:   Drive   Make legal decisions   Operate machinery   Drink alcohol   Return to work  You will receive specific instructions about eating, activities and medications before you leave.    The above instructions have been reviewed and explained to me by   Clide Cliff, RN_______________________    I fully understand and  can verbalize these instructions _____________________________ Date _________

## 2010-11-20 NOTE — Letter (Signed)
Summary: Previsit letter  [facility]  [address] [place], [place] [address]   Phone: [phone]  Fax: [phone]       04/25/2010 MRN: [id number]  Allen [name] [address] [place], [place]  [address]  Dear Mr. FOGEL,  Welcome to the Gastroenterology Division at [facility].    You are scheduled to see a nurse for your pre-procedure visit on 05-14-10 at 2:30p.m. on the 3rd floor at [facility], [address]  We ask that you try to arrive at our office 15 minutes prior to your appointment time to allow for check-in.  Your nurse visit will consist of discussing your medical and surgical history, your immediate family medical history, and your medications.    Please bring a complete list of all your medications or, if you prefer, bring the medication bottles and we will list them.  We will need to be aware of both prescribed and over the counter drugs.  We will need to know exact dosage information as well.  If you are on blood thinners (Coumadin, Plavix, Aggrenox, Ticlid, etc.) please call our office today/prior to your appointment, as we need to consult with your physician about holding your medication.   Please be prepared to read and sign documents such as consent forms, a financial agreement, and acknowledgement forms.  If necessary, and with your consent, a friend or relative is welcome to sit-in on the nurse visit with you.  Please bring your insurance card so that we may make a copy of it.  If your insurance requires a referral to see a specialist, please bring your referral form from your primary care physician.  No co-pay is required for this nurse visit.     If you cannot keep your appointment, please call [phone] to cancel or reschedule prior to your appointment date.  This allows [place] the opportunity to schedule an appointment for another patient in need of care.    Thank you for choosing [facility] for your medical  needs.  We appreciate the opportunity to care for you.  Please visit [place] at our website  to learn more about our practice.                     Sincerely.                                                                                                                   The Gastroenterology Division

## 2010-11-22 NOTE — Progress Notes (Signed)
Summary: PCP Change  Phone Note Call from Patient Call back at [phone]   Summary of Call: Patient wants to change PCP from Dr. Oliver Barre to Dr. Nicoletta Ba because of distance.  Initial call taken by: Georga Bora,  November 08, 2010 12:24 PM  Follow-up for Phone Call        yes Follow-up by: Michell Heinrich M.D.,  November 08, 2010 1:02 PM  Additional Follow-up for Phone Call Additional follow up Details #1::        yes Additional Follow-up by: Corwin Levins MD,  November 08, 2010 1:59 PM    Additional Follow-up for Phone Call Additional follow up Details #2::    Left patient a message to schedule appt.  Follow-up by: Georga Bora,  November 09, 2010 12:05 PM

## 2010-11-28 NOTE — Progress Notes (Signed)
Summary: lab results  Phone Note Other Incoming   Summary of Call: Please notify: his blood work today was excellent.  Continue lipitor like he's doing and low cholesterol/low fat diet.  Take new bp med I gave him today and f/u as instructed.  Initial call taken by: Michell Heinrich M.D.,  November 19, 2010 2:34 PM  Follow-up for Phone Call        lab results reviewed with patient and wife.  Encouraged low fat/low cholesterol diet, exercise.  Continue lipitor as prescribed, start new BP and follow up as instructed at visit earlier today. Follow-up by: Francee Piccolo CMA Duncan Dull),  November 19, 2010 4:24 PM
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
## 2010-12-17 ENCOUNTER — Ambulatory Visit: Payer: Self-pay | Admitting: Family Medicine

## 2010-12-24 ENCOUNTER — Encounter: Payer: Self-pay | Admitting: Family Medicine

## 2010-12-24 ENCOUNTER — Ambulatory Visit (INDEPENDENT_AMBULATORY_CARE_PROVIDER_SITE_OTHER): Payer: Self-pay | Admitting: Family Medicine

## 2010-12-24 DIAGNOSIS — J069 Acute upper respiratory infection, unspecified: Secondary | ICD-10-CM

## 2010-12-24 DIAGNOSIS — I10 Essential (primary) hypertension: Secondary | ICD-10-CM

## 2010-12-24 DIAGNOSIS — E785 Hyperlipidemia, unspecified: Secondary | ICD-10-CM

## 2010-12-26 ENCOUNTER — Encounter: Payer: Self-pay | Admitting: Family Medicine

## 2010-12-27 ENCOUNTER — Telehealth: Payer: Self-pay | Admitting: Family Medicine

## 2010-12-27 DIAGNOSIS — F32A Depression, unspecified: Secondary | ICD-10-CM | POA: Insufficient documentation

## 2010-12-27 DIAGNOSIS — F419 Anxiety disorder, unspecified: Secondary | ICD-10-CM | POA: Insufficient documentation

## 2010-12-27 DIAGNOSIS — F329 Major depressive disorder, single episode, unspecified: Secondary | ICD-10-CM | POA: Insufficient documentation

## 2010-12-31 ENCOUNTER — Ambulatory Visit: Payer: BC Managed Care – PPO | Admitting: Professional

## 2011-01-01 NOTE — Progress Notes (Signed)
Summary: Appt notification  Phone Note Other Incoming   Summary of Call: Pt called and requested referral to [facility].  Order submitted. Initial call taken by: Michell Heinrich M.D.,  December 27, 2010 8:28 AM  Follow-up for Phone Call        Victorino Dike from [facility] has notified patient .3.8.2012. Follow-up by: Georga Bora,  December 27, 2010 12:34 PM  New Problems: DEPRESSION (ICD-311)   New Problems: DEPRESSION (ICD-311)
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
## 2011-01-01 NOTE — Miscellaneous (Signed)
  Clinical Lists Changes  Observations: Added new observation of PAST MED HX: Hyperlipidemia Hypertension GERD chronic left shoulder pain - ? etiology Coronary artery disease: per pt report cath @2001  showed one vessel dz (approx 40% occl).  In records, stress testing 03/2009 NEG for ischemia, +hypertensive bp response. 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 Hiatal hernia Collagenous colitis 2002.  Diverticulosis and 2 hyperplastic polyps 05/2010 Cataracts OU: lens implant in left eye (12/24/2010 9:26) Added new observation of PRIMARY MD: Nicoletta Ba, MD (12/24/2010 9:26)       Past History:  Past Medical History: Hyperlipidemia Hypertension GERD chronic left shoulder pain - ? etiology Coronary artery disease: per pt report cath @2001  showed one vessel dz (approx 40% occl).  In records, stress testing 03/2009 NEG for ischemia, +hypertensive bp response. 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 Hiatal hernia Collagenous colitis 2002.  Diverticulosis and 2 hyperplastic polyps 05/2010 Cataracts OU: lens implant in left eye

## 2011-01-07 ENCOUNTER — Ambulatory Visit: Payer: BC Managed Care – PPO | Admitting: Professional

## 2011-02-13 ENCOUNTER — Ambulatory Visit: Payer: BC Managed Care – PPO | Admitting: Licensed Clinical Social Worker

## 2011-02-18 ENCOUNTER — Ambulatory Visit: Payer: BC Managed Care – PPO | Admitting: Licensed Clinical Social Worker

## 2011-03-08 NOTE — Cardiovascular Report (Signed)
[place]  Patient:    Allen [name]                       MRN: [id number] Proc. Date: 11/21/99 Adm. Date:  [id number] Disc. Date: [id number] Attending:  Alric Quan CC:         Veneda Melter, M.D. [facility]             Corwin Levins, M.D. [facility]                        Cardiac Catheterization  PROCEDURES PERFORMED: 1. Left heart catheterization. 2. Left ventriculogram. 3. Intravascular ultrasound of the right coronary artery. 4. Perclose closure of the right femoral artery.  DIAGNOSES: 1. Mild coronary artery disease by angiogram. 2. Normal left ventricular systolic function.  INDICATIONS:  Mr. Legrand is a 65 year old white male with multiple cardiac risk factors who presents with a two-month history of substernal chest discomfort. he patient underwent a Cardiolite stress test showing ischemic in the inferior wall. He presents now for left heart catheterization.  TECHNIQUE:  After informed consent was obtained, the patient was brought to the  cardiac catheterization lab where both groins were sterilely prepped and draped. Lidocaine 1% was used to infiltrate the right groin and 6 French sheath placed nto the right femoral artery using the modified Seldinger technique.  The 6 French L4 and JR4 catheters were then used to engage the left and right coronary arteries. Selective angiography performed in various projections using manual injections f contrast.  A 6 French pigtail catheter was then advanced to the left ventricle nd the left ventriculogram performed using power injections of contrast.  INITIAL FINDINGS: 1. Left main:  The left main trunk is a large vessel with mild irregularities. 2. Left anterior descending:  This is a large caliber vessel that provides a    large diagonal branch in the proximal segment.  The LAD has mild irregularities    of 20-30% in proximal segment. 3. Ramus intermedius:  This is a small caliber  vessel that provides the    anterolateral wall.  This vessel had mild irregularities. 4. Left circumflex artery:  This is a large caliber vessel that provides a small    first and a large second marginal branch in the mid section.  The proximal    AV circumflex had mild irregularities of 20-30%. 5. Right coronary artery:  Right coronary artery is dominant.  This is a large    caliber vessel that provides the posterior descending artery as well as two    posterior ventricular branches in its terminal segment.  The proximal RCA    has moderate stenosis of 30-40% at the proximal bend.  The mid section of the    RCA has moderate diffuse disease narrowing of 30%.  Mild irregularities are    noted in the posterior descending artery and posterior ventricular branches.   LEFT VENTRICULOGRAM:  Normal end-systolic and end-diastolic dimensions. Overall, left ventricular function is well preserved.  Ejection fraction of greater than  65%.  No mitral regurgitation.  LV pressure is 132/15, aortic 135/72, lvedp equals 23.  These findings were reviewed with the patient with the understanding we would pursue medical management for his coronary disease.  Given his history of hyperlipidemia he was previously consented for enrollment in the REVERSAL study and the patient wished to participated in this.  We thus prepared for intravascular  ultrasound  of the right coronary artery.  A 6 [place] guide catheter was used to engage the right coronary artery and 0.014 inch Hi-Torque Floppy wire advanced n the distal RCA.  The IVUS catheter was introduced after the administration of intracoronary nitroglycerin and automated pullback performed.  Initial images showed a significant amount of nonuniform rotational distortion which could not be corrected despite manual flexion of the catheter and repositioning.  This IVUS catheter as well as the guide were withdrawn and a 6 [place] [employer] JR4 guide catheter  used to engage the right coronary artery.  A new IVUS catheter was introduced and repeat intravascular ultrasound performed of the RCA without incident.  The patient had mild chest discomfort with insertion of the IVUS, which resolved at the termination of the case.  This pain was unlike his previous pain. Repeat angiography showed no evidence of distal vessel damage and TIMI-3 flow through the right coronary artery.  The guide catheter was then removed.  A Perclose suture closure device was deployed to the right femoral artery until adequate hemostasis was achieved.  The patient tolerated the procedure well and was transferred to the floor in stable condition. DD:  11/21/99 TD:  11/21/99 Job: [id number]

## 2011-07-10 ENCOUNTER — Encounter: Payer: BC Managed Care – PPO | Admitting: Family Medicine

## 2011-07-24 ENCOUNTER — Encounter: Payer: BC Managed Care – PPO | Admitting: Family Medicine

## 2011-08-08 ENCOUNTER — Encounter: Payer: Self-pay | Admitting: Family Medicine

## 2011-08-08 ENCOUNTER — Ambulatory Visit (INDEPENDENT_AMBULATORY_CARE_PROVIDER_SITE_OTHER): Payer: BC Managed Care – PPO | Admitting: Family Medicine

## 2011-08-08 VITALS — BP 150/70 | HR 65 | Temp 98.3°F | Ht 71.0 in | Wt 260.0 lb

## 2011-08-08 DIAGNOSIS — Z125 Encounter for screening for malignant neoplasm of prostate: Secondary | ICD-10-CM

## 2011-08-08 DIAGNOSIS — R079 Chest pain, unspecified: Secondary | ICD-10-CM

## 2011-08-08 DIAGNOSIS — Z0000 Encounter for general adult medical examination without abnormal findings: Secondary | ICD-10-CM | POA: Insufficient documentation

## 2011-08-08 DIAGNOSIS — Z23 Encounter for immunization: Secondary | ICD-10-CM

## 2011-08-08 DIAGNOSIS — I2510 Atherosclerotic heart disease of native coronary artery without angina pectoris: Secondary | ICD-10-CM

## 2011-08-08 DIAGNOSIS — J209 Acute bronchitis, unspecified: Secondary | ICD-10-CM | POA: Insufficient documentation

## 2011-08-08 HISTORY — DX: Encounter for general adult medical examination without abnormal findings: Z00.00

## 2011-08-08 HISTORY — DX: Chest pain, unspecified: R07.9

## 2011-08-08 HISTORY — DX: Encounter for screening for malignant neoplasm of prostate: Z12.5

## 2011-08-08 LAB — CBC WITH DIFFERENTIAL/PLATELET
Basophils Absolute: 0.1 10*3/uL (ref 0.0–0.1)
Eosinophils Relative: 1.4 % (ref 0.0–5.0)
HCT: 40 % (ref 39.0–52.0)
Hemoglobin: 13.4 g/dL (ref 13.0–17.0)
Lymphs Abs: 1.9 10*3/uL (ref 0.7–4.0)
MCV: 89.2 fl (ref 78.0–100.0)
Monocytes Absolute: 0.6 10*3/uL (ref 0.1–1.0)
Monocytes Relative: 5.8 % (ref 3.0–12.0)
Neutro Abs: 7.3 10*3/uL (ref 1.4–7.7)
Platelets: 224 10*3/uL (ref 150.0–400.0)
RDW: 14.3 % (ref 11.5–14.6)

## 2011-08-08 LAB — COMPREHENSIVE METABOLIC PANEL
Alkaline Phosphatase: 82 U/L (ref 39–117)
CO2: 31 mEq/L (ref 19–32)
Creatinine, Ser: 1 mg/dL (ref 0.4–1.5)
GFR: 84.11 mL/min (ref >60.00)
Glucose, Bld: 101 mg/dL — ABNORMAL HIGH (ref 70–99)
Sodium: 144 mEq/L (ref 135–145)
Total Bilirubin: 0.6 mg/dL (ref 0.3–1.2)
Total Protein: 6.9 g/dL (ref 6.0–8.3)

## 2011-08-08 LAB — LIPID PANEL
HDL: 39.7 mg/dL (ref >39.00)
Triglycerides: 144 mg/dL (ref 0.0–149.0)
VLDL: 28.8 mg/dL (ref 0.0–40.0)

## 2011-08-08 LAB — PSA: PSA: 0.62 ng/mL (ref 0.10–4.00)

## 2011-08-08 MED ORDER — LISINOPRIL-HYDROCHLOROTHIAZIDE 20-25 MG PO TABS: 1.0000 | ORAL_TABLET | Freq: Every day | ORAL | Status: DC

## 2011-08-08 NOTE — Assessment & Plan Note (Signed)
Reviewed age and gender appropriate health maintenance issues (prudent diet, regular exercise, health risks of tobacco and excessive alcohol, use of seatbelts, fire alarms in home, use of sunscreen).  Also reviewed age and gender appropriate health screening as well as vaccine recommendations. Flu vaccine IM today. Will change to generic lisinopril/hctz 40/25, 1 qd -as a more affordable bp med that he can hopefully stay compliant with. Encouraged compliance with lipitor.  Continue ASA 81mg  qd.  Check FLP, CMET, CBC, and TSH today.  PSA today. Reassured him about current viral URI/bronchitis that seems to be resolving appropriately.

## 2011-08-08 NOTE — Patient Instructions (Signed)
Avoid OTC cold med formulas that have decongestants listed as an ingredient. Try robitussin DM or mucinex DM over the counter for your recent cough.

## 2011-08-08 NOTE — Progress Notes (Signed)
Office Note 08/08/2011  CC:  Chief Complaint  Patient presents with  . Annual Exam    chest pain worse with stress, congestion    HPI:  Allen [name] is a 65 y.o. White male who is here for CPE. Admits to lots of noncompliance with bp med b/c of financial issues, trying to make med last.  Says that despite med noncompliance much of the time, his home bp checks are consistently 140-150 over 80s.  Also minimally compliant with diet and lipitor.  Does not exercise at all.  Last 5d has had cough, mild, worse in hs and am, initially he was hoarse but this part resolved.  No ST, no SOB, no wheezing.  No fevers.  Also has occasional pain in left side of chest, more near anterior axillary line, like a pulling ache, always spontaneous/unprovoked, sometimes noticed more when stressed out.  No exertional CP.  No additional sx's like SOB, diaphoresis, nausea, palpitations, or light headedness.    Past Medical History  Diagnosis Date  . Hyperlipidemia   . Hypertension   . GERD (gastroesophageal reflux disease)   . CAD (coronary artery disease)     per pt report cth @ 2001 showed one vessel dz (approx 40% occl).  In records, stress testing 03/2009 NEG  for ischemia, +hypertensive bp response  . Chronic left shoulder pain   . Spinal cord trauma     c-spine  . Hiatal hernia   . Collagenous colitis 2002  . Cataract     bilateral, lens inplant in left eye    Past Surgical History  Procedure Date  . Tonsillectomy   . Carpal tunnel release   . Cataract extraction w/ intraocular lens implant     left  . Colonoscopy w/ polypectomy 2011    mild diverticulosis and 2 hyperplastic polyps (rpt 10 yrs)    Family History  Problem Relation Age of Onset  . Cancer Mother     uterine  . Colon polyps Father     History   Social History  . Marital Status: Married    Spouse Name: N/A    Number of Children: N/A  . Years of Education: N/A   Occupational History  . Not on file.    Social History Main Topics  . Smoking status: Former Smoker -- 35 years    Quit date: 10/21/1997  . Smokeless tobacco: Never Used  . Alcohol Use: No  . Drug Use: No  . Sexually Active: Not on file   Other Topics Concern  . Not on file   Social History Narrative  . No narrative on file   Current meds: Aspirin 81mg  qd Outpatient Prescriptions Prior to Visit  Medication Sig Dispense Refill  . atorvastatin (LIPITOR) 20 MG tablet Take 20 mg by mouth daily.        . Olmesartan-Amlodipine-HCTZ (TRIBENZOR) 40-5-25 MG TABS Take 1 tablet by mouth daily.          Allergies  Allergen Reactions  . Penicillins     REACTION: hives  . Sulfonamide Derivatives     REACTION: hives    ROS Review of Systems  Constitutional: Negative for fever, chills, appetite change and fatigue.  HENT: Negative for ear pain, congestion, sore throat, neck stiffness and dental problem.   Eyes: Negative for discharge, redness and visual disturbance.  Respiratory: Negative for cough (as per HPI), chest tightness, shortness of breath and wheezing.   Cardiovascular: Negative for chest pain (as per HPI), palpitations and leg  swelling.  Gastrointestinal: Negative for nausea, vomiting, abdominal pain, diarrhea and blood in stool.  Genitourinary: Negative for dysuria, urgency, frequency, hematuria, flank pain and difficulty urinating.  Musculoskeletal: Negative for myalgias, back pain, joint swelling and arthralgias.  Skin: Negative for pallor and rash.  Neurological: Negative for dizziness, speech difficulty, weakness and headaches.  Hematological: Negative for adenopathy. Does not bruise/bleed easily.  Psychiatric/Behavioral: Negative for confusion and sleep disturbance. The patient is not nervous/anxious.      PE; Blood pressure 150/70, pulse 65, temperature 98.3 F (36.8 C), temperature source Oral, height 5\' 11"  (1.803 m), weight 260 lb (117.935 kg), SpO2 98.00%. Gen: Alert, well appearing.  Patient is  oriented to person, place, time, and situation. HEENT: Scalp without lesions or hair loss.  Ears: EACs clear, normal epithelium.  TMs with good light reflex and landmarks bilaterally.  Eyes: no injection, icteris, swelling, or exudate.  EOMI, PERRLA. Nose: no drainage or turbinate edema/swelling.  No injection or focal lesion.  Mouth: lips without lesion/swelling.  Oral mucosa pink and moist.  Dentition intact and without obvious caries or gingival swelling.  Oropharynx without erythema, exudate, or swelling.  Neck: supple, ROM full.  Carotids 2+ bilat, without bruit.  No lymphadenopathy, thyromegaly, or mass. Chest: symmetric expansion, nonlabored respirations.  Clear and equal breath sounds in all lung fields.   CV: RRR, no m/r/g.  Peripheral pulses 2+ and symmetric. ABD: soft, NT, ND, BS normal.  No hepatospenomegaly or mass.  No bruits. EXT: no clubbing, cyanosis, or edema.  Genitals normal; both testes normal without tenderness, masses, hydroceles, varicoceles, erythema or swelling. Shaft normal, uncircumcised, meatus normal without discharge. No inguinal hernia noted. No inguinal lymphadenopathy. Rectal exam: negative without mass, lesions or tenderness.  Brown wipings in rectal vault.  Pertinent labs:  12 lead EKG: normal  ASSESSMENT AND PLAN:   Health maintenance examination Reviewed age and gender appropriate health maintenance issues (prudent diet, regular exercise, health risks of tobacco and excessive alcohol, use of seatbelts, fire alarms in home, use of sunscreen).  Also reviewed age and gender appropriate health screening as well as vaccine recommendations. Flu vaccine IM today. Will change to generic lisinopril/hctz 40/25, 1 qd -as a more affordable bp med that he can hopefully stay compliant with. Encouraged compliance with lipitor.  Continue ASA 81mg  qd.  Check FLP, CMET, CBC, and TSH today.  PSA today. Reassured him about current viral URI/bronchitis that seems to be  resolving appropriately.  Chest pain Suspect noncardiac etiology. Reassured patient.  Needs to begin exercising. Call or return if chest pain changes/worsens, or is associated with new symptoms or is triggered by exertion.     FOLLOW UP:  Return in about 3 months (around 11/08/2011) for f/u HTN.

## 2011-08-08 NOTE — Assessment & Plan Note (Signed)
Suspect noncardiac etiology. Reassured patient.  Needs to begin exercising. Call or return if chest pain changes/worsens, or is associated with new symptoms or is triggered by exertion.

## 2011-08-13 ENCOUNTER — Telehealth: Payer: Self-pay | Admitting: Family Medicine

## 2011-08-13 ENCOUNTER — Other Ambulatory Visit: Payer: Self-pay | Admitting: Family Medicine

## 2011-08-13 MED ORDER — DOXYCYCLINE HYCLATE 100 MG PO TABS: 100.0000 mg | ORAL_TABLET | Freq: Two times a day (BID) | ORAL | Status: AC

## 2011-08-13 NOTE — Telephone Encounter (Signed)
I eRx'd an antibiotic to his pharmacy just now. Please tell him that if he gets no improvement after this then he needs to f/u in the office.  thx--PM

## 2011-08-13 NOTE — Telephone Encounter (Signed)
Patient said his cough is no better, can he get an antiobiotic?

## 2011-08-13 NOTE — Telephone Encounter (Signed)
Please advise 

## 2011-08-13 NOTE — Telephone Encounter (Signed)
Patient informed. 

## 2011-10-30 ENCOUNTER — Ambulatory Visit: Payer: BC Managed Care – PPO | Admitting: Licensed Clinical Social Worker

## 2011-11-14 ENCOUNTER — Other Ambulatory Visit: Payer: Self-pay | Admitting: *Deleted

## 2011-11-14 MED ORDER — ATORVASTATIN CALCIUM 20 MG PO TABS: 20.0000 mg | ORAL_TABLET | Freq: Every day | ORAL | Status: DC

## 2011-11-14 NOTE — Telephone Encounter (Signed)
Last seen 07/2101, needs follow up 10/2011.  30 day supply sent.

## 2012-03-05 ENCOUNTER — Ambulatory Visit: Payer: BC Managed Care – PPO | Admitting: Licensed Clinical Social Worker

## 2013-02-17 ENCOUNTER — Ambulatory Visit (INDEPENDENT_AMBULATORY_CARE_PROVIDER_SITE_OTHER): Payer: BC Managed Care – PPO | Admitting: Family Medicine

## 2013-02-17 ENCOUNTER — Encounter: Payer: Self-pay | Admitting: Family Medicine

## 2013-02-17 VITALS — BP 160/90 | HR 77 | Temp 98.3°F | Resp 16 | Wt 271.5 lb

## 2013-02-17 DIAGNOSIS — S8390XA Sprain of unspecified site of unspecified knee, initial encounter: Secondary | ICD-10-CM | POA: Insufficient documentation

## 2013-02-17 DIAGNOSIS — E785 Hyperlipidemia, unspecified: Secondary | ICD-10-CM

## 2013-02-17 DIAGNOSIS — I10 Essential (primary) hypertension: Secondary | ICD-10-CM | POA: Insufficient documentation

## 2013-02-17 DIAGNOSIS — S8392XA Sprain of unspecified site of left knee, initial encounter: Secondary | ICD-10-CM

## 2013-02-17 DIAGNOSIS — IMO0002 Reserved for concepts with insufficient information to code with codable children: Secondary | ICD-10-CM | POA: Insufficient documentation

## 2013-02-17 DIAGNOSIS — R079 Chest pain, unspecified: Secondary | ICD-10-CM

## 2013-02-17 HISTORY — DX: Sprain of unspecified site of unspecified knee, initial encounter: S83.90XA

## 2013-02-17 LAB — COMPREHENSIVE METABOLIC PANEL
BUN: 18 mg/dL (ref 6–23)
CO2: 30 mEq/L (ref 19–32)
Creatinine, Ser: 1 mg/dL (ref 0.4–1.5)
GFR: 83.7 mL/min (ref >60.00)
Glucose, Bld: 106 mg/dL — ABNORMAL HIGH (ref 70–99)
Total Bilirubin: 0.6 mg/dL (ref 0.3–1.2)

## 2013-02-17 LAB — CBC WITH DIFFERENTIAL/PLATELET
Basophils Absolute: 0 10*3/uL (ref 0.0–0.1)
Basophils Relative: 0.5 % (ref 0.0–3.0)
Eosinophils Relative: 2.2 % (ref 0.0–5.0)
HCT: 42.8 % (ref 39.0–52.0)
Hemoglobin: 14.5 g/dL (ref 13.0–17.0)
Lymphs Abs: 1.9 10*3/uL (ref 0.7–4.0)
Monocytes Relative: 6.2 % (ref 3.0–12.0)
Neutro Abs: 5.4 10*3/uL (ref 1.4–7.7)
RDW: 15 % — ABNORMAL HIGH (ref 11.5–14.6)

## 2013-02-17 MED ORDER — TRAMADOL HCL 50 MG PO TABS: 50.0000 mg | ORAL_TABLET | Freq: Three times a day (TID) | ORAL | Status: DC | PRN

## 2013-02-17 MED ORDER — ATORVASTATIN CALCIUM 20 MG PO TABS: 20.0000 mg | ORAL_TABLET | Freq: Every day | ORAL | Status: DC

## 2013-02-17 MED ORDER — LISINOPRIL-HYDROCHLOROTHIAZIDE 20-25 MG PO TABS: 1.0000 | ORAL_TABLET | Freq: Every day | ORAL | Status: DC

## 2013-02-17 NOTE — Assessment & Plan Note (Signed)
Restart atorvastatin 20mg  qd.

## 2013-02-17 NOTE — Progress Notes (Signed)
OFFICE NOTE  02/17/2013  CC:  Chief Complaint  Patient presents with  . Follow-up    Symptomatic Elevated Blood Pressure  . Knee Pain    Pt c/o pain & swelling intermittently in Left Knee x1 mth.     HPI: Patient is a 67 y.o. Caucasian male who is here for 6 mo f/u HTN and hyperlipidemia. Pt admits to noncompliance with med regimen for about 63mo now.   He got sick of the hassle and cost of taking it.  He now wants to get back on his meds. Has had occasional ache in left side of chest w/out any other sx's, not brought on by exertion (no nausea, no diaphoresis, no radiation, no palpitations, no dizziness, no HA's).  Also, he twisted his left knee about 1 mo ago walking down off of a step onto a walkway and it still hurts.   Initially it swelled and was warm but this has all gone away.  He points to a focal area of left knee that hurts with lots of wt bearing, hears a pop at times.  Says it does still swell a small amount if he has to walk on it much.  He has not been checking his bp any.  No chest pain today/now.  Pertinent PMH:  Past Medical History  Diagnosis Date  . Hyperlipidemia   . Hypertension   . GERD (gastroesophageal reflux disease)   . CAD (coronary artery disease)     per pt report cth @ 2001 showed one vessel dz (approx 40% occl).  In records, stress testing 03/2009 NEG  for ischemia, +hypertensive bp response  . Chronic left shoulder pain   . Spinal cord trauma     c-spine  . Hiatal hernia   . Collagenous colitis 2002  . Cataract     bilateral, lens inplant in left eye   Social Hx:  Married, no T/A/Ds.  He and his wife take care of 2 foster kids, 2 adopted kids, and their 2 grandchildren.  MEDS:  Outpatient Prescriptions Prior to Visit  Medication Sig Dispense Refill  . aspirin 81 MG tablet Take 81 mg by mouth daily.        [name] atorvastatin (LIPITOR) 20 MG tablet Take 1 tablet (20 mg total) by mouth daily. Needs appt for further refills  30 tablet  0  .  lisinopril-hydrochlorothiazide (PRINZIDE,ZESTORETIC) 20-25 MG per tablet Take 1 tablet by mouth daily.  30 tablet  6   No facility-administered medications prior to visit.    PE: Blood pressure 160/90, pulse 77, temperature 98.3 F (36.8 C), temperature source Oral, resp. rate 16, weight 271 lb 8 oz (123.152 kg), SpO2 96.00%. Gen: Alert, well appearing, obese white male in NAD.  Patient is oriented to person, place, time, and situation. AFFECT: pleasant, lucid thought and speech. CV: RRR, no m/r/g.   LUNGS: CTA bilat, nonlabored resps, good aeration in all lung fields. EXT: no clubbing, cyanosis, or edema.  Left knee: no erythema, warmth, or swelling.  Moderate pain when knee is put in full extension (pain in medial joint line). Medial joint line TTP.  McMurray's testing elicited lots of pain in medial joint line but no catching and no palpable abnormality in joint line when this was done.  Lachman's negative.  Stress on medial collateral ligament did elicit pain.    LAB: 12 lead EKG today showed NSR, nonspecific T wave changes.  IMPRESSION AND PLAN:  Uncontrolled hypertension Noncompliance with med regimen, diet, exercise. The chest  pains he is having could be from his hypertensiv Restart lisin/hctz 20/25 qd today.  Hold aspirin until bp under control.  Knee sprain and strain Possible meniscal injury vs medial collateral ligament sprain. Ordered knee mri w/out contrast to further evaluate. Knee brace for support ordered, recommended avoidance of NSAIDs of pain for now since bp out of control. I recommended tylenol 1000mg  tid.  I also rx'd tramadol 50mg , take 1-2 q6h prn pain, #30, no RF.  HYPERLIPIDEMIA Restart atorvastatin 20mg  qd.   An After Visit Summary was printed and given to the patient.  FOLLOW UP: 7-10d for HTN

## 2013-02-17 NOTE — Assessment & Plan Note (Signed)
Possible meniscal injury vs medial collateral ligament sprain. Ordered knee mri w/out contrast to further evaluate. Knee brace for support ordered, recommended avoidance of NSAIDs of pain for now since bp out of control. I recommended tylenol 1000mg  tid.  I also rx'd tramadol 50mg , take 1-2 q6h prn pain, #30, no RF.

## 2013-02-17 NOTE — Assessment & Plan Note (Addendum)
Noncompliance with med regimen, diet, exercise. The chest pains he is having could be from his hypertensiv Restart lisin/hctz 20/25 qd today.  Hold aspirin until bp under control.

## 2013-02-18 ENCOUNTER — Encounter: Payer: Self-pay | Admitting: *Deleted

## 2013-02-20 ENCOUNTER — Other Ambulatory Visit (HOSPITAL_BASED_OUTPATIENT_CLINIC_OR_DEPARTMENT_OTHER): Payer: BC Managed Care – PPO

## 2013-02-25 ENCOUNTER — Ambulatory Visit: Payer: BC Managed Care – PPO | Admitting: Family Medicine

## 2013-04-07 ENCOUNTER — Ambulatory Visit (INDEPENDENT_AMBULATORY_CARE_PROVIDER_SITE_OTHER): Payer: BC Managed Care – PPO | Admitting: Family Medicine

## 2013-04-07 ENCOUNTER — Encounter: Payer: Self-pay | Admitting: Family Medicine

## 2013-04-07 ENCOUNTER — Ambulatory Visit (INDEPENDENT_AMBULATORY_CARE_PROVIDER_SITE_OTHER)
Admission: RE | Admit: 2013-04-07 | Discharge: 2013-04-07 | Disposition: A | Discharge status: Home / Self Care | Payer: BC Managed Care – PPO | Source: Ambulatory Visit | Attending: Family Medicine | Admitting: Family Medicine

## 2013-04-07 VITALS — BP 157/82 | HR 74 | Temp 98.5°F | Resp 18 | Ht 71.0 in | Wt 266.0 lb

## 2013-04-07 DIAGNOSIS — I10 Essential (primary) hypertension: Secondary | ICD-10-CM

## 2013-04-07 DIAGNOSIS — Z87891 Personal history of nicotine dependence: Secondary | ICD-10-CM | POA: Insufficient documentation

## 2013-04-07 DIAGNOSIS — E785 Hyperlipidemia, unspecified: Secondary | ICD-10-CM

## 2013-04-07 DIAGNOSIS — J209 Acute bronchitis, unspecified: Secondary | ICD-10-CM | POA: Insufficient documentation

## 2013-04-07 MED ORDER — PREDNISONE 20 MG PO TABS: ORAL_TABLET | ORAL | Status: DC

## 2013-04-07 MED ORDER — AZITHROMYCIN 250 MG PO TABS: ORAL_TABLET | ORAL | Status: DC

## 2013-04-07 NOTE — Progress Notes (Signed)
OFFICE NOTE  04/07/2013  CC:  Chief Complaint  Patient presents with  . URI    X 3 weeks  . Cough  . Shortness of Breath     HPI: Patient is a 67 y.o. Caucasian male who is here for URI/cough sx's x 3 wks.   Onset 3 wks ago or so, initially severe cold sx's and cough with fevers, but this appropriately ran its course except for the cough.  Heat makes it worse.  Some breathlessness. Mornings he feels wheezing some.  Cough usually productive of milky colored phlegm.  OTC cold/cough med ok for people with HBP tried 3-4 d but did no good.     Home bp monitoring shows 140 over <90 consistently.  Pertinent PMH:  Past Medical History  Diagnosis Date  . Hyperlipidemia   . Hypertension   . GERD (gastroesophageal reflux disease)   . CAD (coronary artery disease)     per pt report cth @ 2001 showed one vessel dz (approx 40% occl).  In records, stress testing 03/2009 NEG  for ischemia, +hypertensive bp response  . Chronic left shoulder pain   . Spinal cord trauma     c-spine  . Hiatal hernia   . Collagenous colitis 2002  . Cataract     bilateral, lens inplant in left eye   Past surgical, social, and family history reviewed and no changes noted since last office visit.  MEDS:  Outpatient Prescriptions Prior to Visit  Medication Sig Dispense Refill  . atorvastatin (LIPITOR) 20 MG tablet Take 1 tablet (20 mg total) by mouth daily. Needs appt for further refills  30 tablet  6  . traMADol (ULTRAM) 50 MG tablet Take 1 tablet (50 mg total) by mouth every 8 (eight) hours as needed for pain.  30 tablet  0  . aspirin 81 MG tablet Take 81 mg by mouth daily.        [name] lisinopril-hydrochlorothiazide (PRINZIDE,ZESTORETIC) 20-25 MG per tablet Take 1 tablet by mouth daily.  30 tablet  6   No facility-administered medications prior to visit.    PE: Blood pressure 157/82, pulse 74, temperature 98.5 F (36.9 C), temperature source Oral, resp. rate 18, height 5\' 11"  (1.803 m), weight 266 lb  (120.657 kg), SpO2 97.00%. Gen: Alert, well appearing.  Patient is oriented to person, place, time, and situation. ENT: Ears: EACs clear, normal epithelium.  TMs with good light reflex and landmarks bilaterally.  Eyes: no injection, icteris, swelling, or exudate.  EOMI, PERRLA. Nose: no drainage or turbinate edema/swelling.  No injection or focal lesion.  Mouth: lips without lesion/swelling.  Oral mucosa pink and moist.  Dentition intact and without obvious caries or gingival swelling.  Oropharynx without erythema, exudate, or swelling.  Neck - No masses or thyromegaly or limitation in range of motion CV: RRR, no m/r/g LUNGS: CTA bilat with the exception of mild coarse end-exp rhonchi--brief---sounds like transmitted upper airway noise b/c he is exhaling so forcefully.  No wheezing, no airflow limitation, no post-exhalation coughing. EXT: no clubbing, cyanosis, or edema.   LAB: CXR done today: some hyperlucency in bilat upper lung regions to suggest air trapping, with diffuse bronchitic changes.  No infiltrate.  Heart size ULN.  No effusion. Radiologist's over-read is pending  IMPRESSION AND PLAN:  Acute bronchitis Prolonged sx's: will do 5d course of azithromycin and 40mg  prednisone qd x 5d. No cough suppressant or other symptomatic med recommended at this time.  I do not detect a RAD component to this.  History of tobacco abuse Given patient's long smoking history and current sx's, we'll get a baseline CXR today. Fortunately he gives no history that is suggestive of COPD at this time.  HYPERTENSION Hx of poor control due to medication noncompliance.   Now back under control and staying on meds. May restart ASA 81mg  qd.  HYPERLIPIDEMIA Continue atorvastatin. Next routine f/u he needs a fasting lipid panel (last one was 2012).   An After Visit Summary was printed and given to the patient.  FOLLOW UP: prn for this problem, otherwise 4-6 mo

## 2013-04-07 NOTE — Assessment & Plan Note (Addendum)
Given patient's long smoking history and current sx's, we'll get a baseline CXR today. Fortunately he gives no history that is suggestive of COPD at this time.

## 2013-04-07 NOTE — Assessment & Plan Note (Signed)
Prolonged sx's: will do 5d course of azithromycin and 40mg  prednisone qd x 5d. No cough suppressant or other symptomatic med recommended at this time.  I do not detect a RAD component to this.

## 2013-04-07 NOTE — Assessment & Plan Note (Signed)
Continue atorvastatin. Next routine f/u he needs a fasting lipid panel (last one was 2012).

## 2013-04-07 NOTE — Assessment & Plan Note (Signed)
Hx of poor control due to medication noncompliance.   Now back under control and staying on meds. May restart ASA 81mg  qd.

## 2013-05-25 ENCOUNTER — Encounter (HOSPITAL_COMMUNITY): Payer: Self-pay | Admitting: Pharmacy Technician

## 2013-05-25 NOTE — Patient Instructions (Signed)
Allen [name]  05/25/2013   Your procedure is scheduled on:  06/01/13  Report to [facility] at 0700 AM.  Call this number if you have problems the morning of surgery: [phone]   Remember:   Do not eat food or drink liquids after midnight.   Take these medicines the morning of surgery with A SIP OF WATER: lisinopril   Do not wear jewelry, make-up or nail polish.  Do not wear lotions, powders, or perfumes. You may wear deodorant.  Do not shave 48 hours prior to surgery. Men may shave face and neck.  Do not bring valuables to the hospital.  [facility] is not responsible                   for any belongings or valuables.  Contacts, dentures or bridgework may not be worn into surgery.  Leave suitcase in the car. After surgery it may be brought to your room.  For patients admitted to the hospital, checkout time is 11:00 AM the day of  discharge.   Patients discharged the day of surgery will not be allowed to drive  home.  Name and phone number of your driver: family  Special Instructions: N/A   Please read over the following fact sheets that you were given: Surgical Site Infection Prevention, Anesthesia Post-op Instructions and Care and Recovery After Surgery   PATIENT INSTRUCTIONS POST-ANESTHESIA  IMMEDIATELY FOLLOWING SURGERY:  Do not drive or operate machinery for the first twenty four hours after surgery.  Do not make any important decisions for twenty four hours after surgery or while taking narcotic pain medications or sedatives.  If you develop intractable nausea and vomiting or a severe headache please notify your doctor immediately.  FOLLOW-UP:  Please make an appointment with your surgeon as instructed. You do not need to follow up with anesthesia unless specifically instructed to do so.  WOUND CARE INSTRUCTIONS (if applicable):  Keep a dry clean dressing on the anesthesia/puncture wound site if there is drainage.  Once the wound has quit draining you may leave it open to air.   Generally you should leave the bandage intact for twenty four hours unless there is drainage.  If the epidural site drains for more than 36-48 hours please call the anesthesia department.  QUESTIONS?:  Please feel free to call your physician or the hospital operator if you have any questions, and they will be happy to assist you.      Cataract Surgery  A cataract is a clouding of the lens of the eye. When a lens becomes cloudy, vision is reduced based on the degree and nature of the clouding. Surgery may be needed to improve vision. Surgery removes the cloudy lens and usually replaces it with a substitute lens (intraocular lens, IOL). LET YOUR EYE DOCTOR KNOW ABOUT:  Allergies to food or medicine.  Medicines taken including herbs, eyedrops, over-the-counter medicines, and creams.  Use of steroids (by mouth or creams).  Previous problems with anesthetics or numbing medicine.  History of bleeding problems or blood clots.  Previous surgery.  Other health problems, including diabetes and kidney problems.  Possibility of pregnancy, if this applies. RISKS AND COMPLICATIONS  Infection.  Inflammation of the eyeball (endophthalmitis) that can spread to both eyes (sympathetic ophthalmia).  Poor wound healing.  If an IOL is inserted, it can later fall out of proper position. This is very uncommon.  Clouding of the part of your eye that holds an IOL in place. This  is called an "after-cataract." These are uncommon, but easily treated. BEFORE THE PROCEDURE  Do not eat or drink anything except small amounts of water for 8 to 12 before your surgery, or as directed by your caregiver.  Unless you are told otherwise, continue any eyedrops you have been prescribed.  Talk to your primary caregiver about all other medicines that you take (both prescription and non-prescription). In some cases, you may need to stop or change medicines near the time of your surgery. This is most important if you  are taking blood-thinning medicine.Do not stop medicines unless you are told to do so.  Arrange for someone to drive you to and from the procedure.  Do not put contact lenses in either eye on the day of your surgery. PROCEDURE There is more than one method for safely removing a cataract. Your doctor can explain the differences and help determine which is best for you. Phacoemulsification surgery is the most common form of cataract surgery.  An injection is given behind the eye or eyedrops are given to make this a painless procedure.  A small cut (incision) is made on the edge of the clear, dome-shaped surface that covers the front of the eye (cornea).  A tiny probe is painlessly inserted into the eye. This device gives off ultrasound waves that soften and break up the cloudy center of the lens. This makes it easier for the cloudy lens to be removed by suction.  An IOL may be implanted.  The normal lens of the eye is covered by a clear capsule. Part of that capsule is intentionally left in the eye to support the IOL.  Your surgeon may or may not use stitches to close the incision. There are other forms of cataract surgery that require a larger incision and stiches to close the eye. This approach is taken in cases where the doctor feels that the cataract cannot be easily removed using phacoemulsification. AFTER THE PROCEDURE  When an IOL is implanted, it does not need care. It becomes a permanent part of your eye and cannot be seen or felt.  Your doctor will schedule follow-up exams to check on your progress.  Review your other medicines with your doctor to see which can be resumed after surgery.  Use eyedrops or take medicine as prescribed by your doctor. Document Released: 09/26/2011 Document Revised: 12/30/2011 Document Reviewed: 09/26/2011 [facility] Patient Information 2014 [place], [place]. Cataract A cataract is a clouding of the lens of the eye. When a lens becomes cloudy, vision  is reduced based on the degree and nature of the clouding. Many cataracts reduce vision to some degree. Some cataracts make people more near-sighted as they develop. Other cataracts increase glare. Cataracts that are ignored and become worse can sometimes look white. The white color can be seen through the pupil. CAUSES   Aging. However, cataracts may occur at any age, even in newborns.  Certain drugs.  Trauma to the eye.  Certain diseases such as diabetes.  Specific eye diseases such as chronic inflammation inside the eye or a sudden attack of a rare form of glaucoma.  Inherited or acquired medical problems. SYMPTOMS   Gradual, progressive drop in vision in the affected eye.  Severe, rapid visual loss. This most often happens when trauma is the cause. DIAGNOSIS  To detect a cataract, an eye doctor examines the lens. Cataracts are best diagnosed with an exam of the eyes with the pupils enlarged (dilated) by drops.  TREATMENT  For an  early cataract, vision may improve by using different eyeglasses or stronger lighting. If that does not help your vision, surgery is the only effective treatment. A cataract needs to be surgically removed when vision loss interferes with your everyday activities, such as driving, reading, or watching TV. A cataract may also have to be removed if it prevents examination or treatment of another eye problem. Surgery removes the cloudy lens and usually replaces it with a substitute lens (intraocular lens, IOL).  At a time when both you and your doctor agree, the cataract will be surgically removed. If you have cataracts in both eyes, only one is usually removed at a time. This allows the operated eye to heal and be out of danger from any possible problems after surgery (such as infection or poor wound healing). In rare cases, a cataract may be doing damage to your eye. In these cases, your caregiver may advise surgical removal right away. The vast majority of people  who have cataract surgery have better vision afterward. HOME CARE INSTRUCTIONS  If you are not planning surgery, you may be asked to do the following:  Use different eyeglasses.  Use stronger or brighter lighting.  Ask your eye doctor about reducing your medicine dose or changing medicines if it is thought that a medicine caused your cataract. Changing medicines does not make the cataract go away on its own.  Become familiar with your surroundings. Poor vision can lead to injury. Avoid bumping into things on the affected side. You are at a higher risk for tripping or falling.  Exercise extreme care when driving or operating machinery.  Wear sunglasses if you are sensitive to bright light or experiencing problems with glare. SEEK IMMEDIATE MEDICAL CARE IF:   You have a worsening or sudden vision loss.  You notice redness, swelling, or increasing pain in the eye.  You have a fever. Document Released: 10/07/2005 Document Revised: 12/30/2011 Document Reviewed: 05/31/2011 [facility] Patient Information 2014 [place], [place].

## 2013-05-26 ENCOUNTER — Encounter (HOSPITAL_COMMUNITY)
Admission: RE | Admit: 2013-05-26 | Discharge: 2013-05-26 | Disposition: A | Discharge status: Home / Self Care | Payer: BC Managed Care – PPO | Source: Ambulatory Visit | Attending: Ophthalmology | Admitting: Ophthalmology

## 2013-06-01 ENCOUNTER — Ambulatory Visit (HOSPITAL_COMMUNITY): Admission: RE | Admit: 2013-06-01 | Payer: BC Managed Care – PPO | Source: Ambulatory Visit | Admitting: Ophthalmology

## 2013-06-01 ENCOUNTER — Encounter (HOSPITAL_COMMUNITY): Admission: RE | Payer: Self-pay | Source: Ambulatory Visit

## 2013-06-01 SURGERY — PHACOEMULSIFICATION, CATARACT, WITH IOL INSERTION
Anesthesia: Monitor Anesthesia Care | Laterality: Right

## 2013-06-15 ENCOUNTER — Ambulatory Visit (HOSPITAL_COMMUNITY): Admission: RE | Admit: 2013-06-15 | Payer: BC Managed Care – PPO | Source: Ambulatory Visit | Admitting: Ophthalmology

## 2013-06-15 ENCOUNTER — Encounter (HOSPITAL_COMMUNITY): Admission: RE | Payer: Self-pay | Source: Ambulatory Visit

## 2013-06-15 SURGERY — TREATMENT, USING YAG LASER
Anesthesia: LOCAL | Laterality: Left

## 2013-08-16 ENCOUNTER — Encounter (HOSPITAL_COMMUNITY): Payer: Self-pay | Admitting: Pharmacy Technician

## 2013-08-24 ENCOUNTER — Encounter (HOSPITAL_COMMUNITY): Admission: RE | Admit: 2013-08-24 | Payer: BC Managed Care – PPO | Source: Ambulatory Visit

## 2013-08-26 NOTE — Patient Instructions (Signed)
Your procedure is scheduled on:  08/31/2013  Report to [facility] at    9:30  AM.  Call this number if you have problems the morning of surgery: [phone]   Remember:   Do not eat or drink :After Midnight.    Take these medicines the morning of surgery with A SIP OF WATER: lisinopril   Do not wear jewelry, make-up or nail polish.  Do not wear lotions, powders, or perfumes. You may wear deodorant.  Do not shave 48 hours prior to surgery.  Do not bring valuables to the hospital.  Contacts, dentures or bridgework may not be worn into surgery.  Patients discharged the day of surgery will not be allowed to drive home.  Name and phone number of your driver:    Please read over the following fact sheets that you were given: Pain Booklet, Surgical Site Infection Prevention, Anesthesia Post-op Instructions and Care and Recovery After Surgery  Cataract Surgery  A cataract is a clouding of the lens of the eye. When a lens becomes cloudy, vision is reduced based on the degree and nature of the clouding. Surgery may be needed to improve vision. Surgery removes the cloudy lens and usually replaces it with a substitute lens (intraocular lens, IOL). LET YOUR EYE DOCTOR KNOW ABOUT:  Allergies to food or medicine.   Medicines taken including herbs, eyedrops, over-the-counter medicines, and creams.   Use of steroids (by mouth or creams).   Previous problems with anesthetics or numbing medicine.   History of bleeding problems or blood clots.   Previous surgery.   Other health problems, including diabetes and kidney problems.   Possibility of pregnancy, if this applies.  RISKS AND COMPLICATIONS  Infection.   Inflammation of the eyeball (endophthalmitis) that can spread to both eyes (sympathetic ophthalmia).   Poor wound healing.   If an IOL is inserted, it can later fall out of proper position. This is very uncommon.   Clouding of the part of your eye that holds an IOL in place. This  is called an "after-cataract." These are uncommon, but easily treated.  BEFORE THE PROCEDURE  Do not eat or drink anything except small amounts of water for 8 to 12 before your surgery, or as directed by your caregiver.   Unless you are told otherwise, continue any eyedrops you have been prescribed.   Talk to your primary caregiver about all other medicines that you take (both prescription and non-prescription). In some cases, you may need to stop or change medicines near the time of your surgery. This is most important if you are taking blood-thinning medicine.Do not stop medicines unless you are told to do so.   Arrange for someone to drive you to and from the procedure.   Do not put contact lenses in either eye on the day of your surgery.  PROCEDURE There is more than one method for safely removing a cataract. Your doctor can explain the differences and help determine which is best for you. Phacoemulsification surgery is the most common form of cataract surgery.  An injection is given behind the eye or eyedrops are given to make this a painless procedure.   A small cut (incision) is made on the edge of the clear, dome-shaped surface that covers the front of the eye (cornea).   A tiny probe is painlessly inserted into the eye. This device gives off ultrasound waves that soften and break up the cloudy center of the lens. This makes it easier for  the cloudy lens to be removed by suction.   An IOL may be implanted.   The normal lens of the eye is covered by a clear capsule. Part of that capsule is intentionally left in the eye to support the IOL.   Your surgeon may or may not use stitches to close the incision.  There are other forms of cataract surgery that require a larger incision and stiches to close the eye. This approach is taken in cases where the doctor feels that the cataract cannot be easily removed using phacoemulsification. AFTER THE PROCEDURE  When an IOL is implanted, it  does not need care. It becomes a permanent part of your eye and cannot be seen or felt.   Your doctor will schedule follow-up exams to check on your progress.   Review your other medicines with your doctor to see which can be resumed after surgery.   Use eyedrops or take medicine as prescribed by your doctor.  Document Released: 09/26/2011 Document Reviewed: 09/23/2011 [facility] Patient Information 2012 [place].  .Cataract Surgery Care After Refer to this sheet in the next few weeks. These instructions provide you with information on caring for yourself after your procedure. Your caregiver may also give you more specific instructions. Your treatment has been planned according to current medical practices, but problems sometimes occur. Call your caregiver if you have any problems or questions after your procedure.  HOME CARE INSTRUCTIONS   Avoid strenuous activities as directed by your caregiver.   Ask your caregiver when you can resume driving.   Use eyedrops or other medicines to help healing and control pressure inside your eye as directed by your caregiver.   Only take over-the-counter or prescription medicines for pain, discomfort, or fever as directed by your caregiver.   Do not to touch or rub your eyes.   You may be instructed to use a protective shield during the first few days and nights after surgery. If not, wear sunglasses to protect your eyes. This is to protect the eye from pressure or from being accidentally bumped.   Keep the area around your eye clean and dry. Avoid swimming or allowing water to hit you directly in the face while showering. Keep soap and shampoo out of your eyes.   Do not bend or lift heavy objects. Bending increases pressure in the eye. You can walk, climb stairs, and do light household chores.   Do not put a contact lens into the eye that had surgery until your caregiver says it is okay to do so.   Ask your doctor when you can return to  work. This will depend on the kind of work that you do. If you work in a dusty environment, you may be advised to wear protective eyewear for a period of time.   Ask your caregiver when it will be safe to engage in sexual activity.   Continue with your regular eye exams as directed by your caregiver.  What to expect:  It is normal to feel itching and mild discomfort for a few days after cataract surgery. Some fluid discharge is also common, and your eye may be sensitive to light and touch.   After 1 to 2 days, even moderate discomfort should disappear. In most cases, healing will take about 6 weeks.   If you received an intraocular lens (IOL), you may notice that colors are very bright or have a blue tinge. Also, if you have been in bright sunlight, everything may appear  reddish for a few hours. If you see these color tinges, it is because your lens is clear and no longer cloudy. Within a few months after receiving an IOL, these extra colors should go away. When you have healed, you will probably need new glasses.  SEEK MEDICAL CARE IF:   You have increased bruising around your eye.   You have discomfort not helped by medicine.  SEEK IMMEDIATE MEDICAL CARE IF:   You have a fever.   You have a worsening or sudden vision loss.   You have redness, swelling, or increasing pain in the eye.   You have a thick discharge from the eye that had surgery.  MAKE SURE YOU:  Understand these instructions.   Will watch your condition.   Will get help right away if you are not doing well or get worse.  Document Released: 04/26/2005 Document Revised: 09/26/2011 Document Reviewed: 05/31/2011 [facility] Patient Information 2012 [place].

## 2013-08-27 ENCOUNTER — Encounter (HOSPITAL_COMMUNITY)
Admission: RE | Admit: 2013-08-27 | Discharge: 2013-08-27 | Disposition: A | Discharge status: Home / Self Care | Payer: Medicare Other | Source: Ambulatory Visit | Attending: Ophthalmology | Admitting: Ophthalmology

## 2013-08-27 ENCOUNTER — Encounter (HOSPITAL_COMMUNITY): Payer: Self-pay

## 2013-08-27 DIAGNOSIS — Z01812 Encounter for preprocedural laboratory examination: Secondary | ICD-10-CM | POA: Insufficient documentation

## 2013-08-27 HISTORY — DX: Other chronic pain: G89.29

## 2013-08-27 HISTORY — DX: Cervicalgia: M54.2

## 2013-08-27 LAB — BASIC METABOLIC PANEL
BUN: 19 mg/dL (ref 6–23)
Calcium: 9.5 mg/dL (ref 8.4–10.5)
GFR calc non Af Amer: 87 mL/min — ABNORMAL LOW (ref >90)
Glucose, Bld: 102 mg/dL — ABNORMAL HIGH (ref 70–99)

## 2013-08-30 MED ORDER — CYCLOPENTOLATE-PHENYLEPHRINE OP SOLN OPTIME - NO CHARGE
OPHTHALMIC | Status: AC
  Filled 2013-08-30: qty 2

## 2013-08-30 MED ORDER — KETOROLAC TROMETHAMINE 0.5 % OP SOLN
OPHTHALMIC | Status: AC
  Filled 2013-08-30: qty 5

## 2013-08-30 MED ORDER — TETRACAINE HCL 0.5 % OP SOLN
OPHTHALMIC | Status: AC
  Filled 2013-08-30: qty 2

## 2013-08-31 ENCOUNTER — Ambulatory Visit (HOSPITAL_COMMUNITY): Payer: Medicare Other | Admitting: Anesthesiology

## 2013-08-31 ENCOUNTER — Encounter (HOSPITAL_COMMUNITY): Payer: Self-pay | Admitting: *Deleted

## 2013-08-31 ENCOUNTER — Ambulatory Visit (HOSPITAL_COMMUNITY)
Admission: RE | Admit: 2013-08-31 | Discharge: 2013-08-31 | Disposition: A | Discharge status: Home / Self Care | Payer: Medicare Other | Source: Ambulatory Visit | Attending: Ophthalmology | Admitting: Ophthalmology

## 2013-08-31 ENCOUNTER — Encounter (HOSPITAL_COMMUNITY): Payer: Medicare Other | Admitting: Anesthesiology

## 2013-08-31 ENCOUNTER — Encounter (HOSPITAL_COMMUNITY)
Admission: RE | Disposition: A | Discharge status: Home / Self Care | Payer: Self-pay | Source: Ambulatory Visit | Attending: Ophthalmology

## 2013-08-31 DIAGNOSIS — I10 Essential (primary) hypertension: Secondary | ICD-10-CM | POA: Insufficient documentation

## 2013-08-31 DIAGNOSIS — H2510 Age-related nuclear cataract, unspecified eye: Secondary | ICD-10-CM | POA: Insufficient documentation

## 2013-08-31 HISTORY — PX: CATARACT EXTRACTION W/PHACO: SHX586

## 2013-08-31 SURGERY — PHACOEMULSIFICATION, CATARACT, WITH IOL INSERTION
Anesthesia: Monitor Anesthesia Care | Site: Eye | Laterality: Right | Wound class: Clean

## 2013-08-31 MED ORDER — PHENYLEPHRINE HCL 2.5 % OP SOLN
OPHTHALMIC | Status: AC
  Filled 2013-08-31: qty 15

## 2013-08-31 MED ORDER — TETRACAINE HCL 0.5 % OP SOLN
1.0000 [drp] | OPHTHALMIC | Status: AC
  Administered 2013-08-31 (×3): 1 [drp] via OPHTHALMIC

## 2013-08-31 MED ORDER — LACTATED RINGERS IV SOLN
INTRAVENOUS | Status: DC
  Administered 2013-08-31: 1000 mL via INTRAVENOUS

## 2013-08-31 MED ORDER — FENTANYL CITRATE 0.05 MG/ML IJ SOLN
25.0000 ug | INTRAMUSCULAR | Status: AC
  Administered 2013-08-31 (×2): 25 ug via INTRAVENOUS

## 2013-08-31 MED ORDER — BSS IO SOLN
INTRAOCULAR | Status: DC | PRN
  Administered 2013-08-31: 15 mL via INTRAOCULAR

## 2013-08-31 MED ORDER — EPINEPHRINE HCL 1 MG/ML IJ SOLN
INTRAOCULAR | Status: DC | PRN
  Administered 2013-08-31: 11:00:00

## 2013-08-31 MED ORDER — CYCLOPENTOLATE-PHENYLEPHRINE 0.2-1 % OP SOLN
1.0000 [drp] | OPHTHALMIC | Status: AC
  Administered 2013-08-31 (×3): 1 [drp] via OPHTHALMIC

## 2013-08-31 MED ORDER — EPINEPHRINE HCL 1 MG/ML IJ SOLN
INTRAMUSCULAR | Status: AC
  Filled 2013-08-31: qty 1

## 2013-08-31 MED ORDER — KETOROLAC TROMETHAMINE 0.5 % OP SOLN
1.0000 [drp] | OPHTHALMIC | Status: AC
  Administered 2013-08-31 (×3): 1 [drp] via OPHTHALMIC

## 2013-08-31 MED ORDER — PROVISC 10 MG/ML IO SOLN
INTRAOCULAR | Status: DC | PRN
  Administered 2013-08-31: 0.85 mL via INTRAOCULAR

## 2013-08-31 MED ORDER — MIDAZOLAM HCL 2 MG/2ML IJ SOLN
1.0000 mg | INTRAMUSCULAR | Status: DC | PRN
  Administered 2013-08-31: 2 mg via INTRAVENOUS

## 2013-08-31 MED ORDER — MIDAZOLAM HCL 2 MG/2ML IJ SOLN
INTRAMUSCULAR | Status: AC
  Filled 2013-08-31: qty 2

## 2013-08-31 MED ORDER — FENTANYL CITRATE 0.05 MG/ML IJ SOLN
INTRAMUSCULAR | Status: AC
  Filled 2013-08-31: qty 2

## 2013-08-31 MED ORDER — PHENYLEPHRINE HCL 2.5 % OP SOLN
1.0000 [drp] | OPHTHALMIC | Status: AC
  Administered 2013-08-31 (×3): 1 [drp] via OPHTHALMIC

## 2013-08-31 SURGICAL SUPPLY — 23 items

## 2013-08-31 NOTE — Transfer of Care (Signed)
Immediate Anesthesia Transfer of Care Note  Patient: Allen [name]  Procedure(s) Performed: Procedure(s) with comments: CATARACT EXTRACTION PHACO AND INTRAOCULAR LENS PLACEMENT (IOC) (Right) - CDE:7.85  Patient Location: Short Stay  Anesthesia Type:MAC  Level of Consciousness: awake, alert  and oriented  Airway & Oxygen Therapy: Patient Spontanous Breathing  Post-op Assessment: Report given to PACU RN  Post vital signs: Reviewed and stable  Complications: No apparent anesthesia complications

## 2013-08-31 NOTE — H&P (Signed)
The patient was re examined and there is no change in the patients condition since the original H and P. 

## 2013-08-31 NOTE — Anesthesia Preprocedure Evaluation (Signed)
Anesthesia Evaluation  Patient identified by MRN, date of birth, ID band Patient awake    Reviewed: Allergy & Precautions, H&P , NPO status , Patient's Chart, lab work & pertinent test results  Airway Mallampati: II TM Distance: >3 FB     Dental  (+) Edentulous Upper and Edentulous Lower   Pulmonary neg pulmonary ROS, former smoker,  breath sounds clear to auscultation        Cardiovascular hypertension, Pt. on medications + CAD Rhythm:Regular Rate:Normal     Neuro/Psych PSYCHIATRIC DISORDERS Depression    GI/Hepatic hiatal hernia, GERD-  Controlled and Medicated,  Endo/Other    Renal/GU      Musculoskeletal   Abdominal   Peds  Hematology   Anesthesia Other Findings   Reproductive/Obstetrics                           Anesthesia Physical Anesthesia Plan  ASA: II  Anesthesia Plan: MAC   Post-op Pain Management:    Induction: Intravenous  Airway Management Planned: Nasal Cannula  Additional Equipment:   Intra-op Plan:   Post-operative Plan:   Informed Consent: I have reviewed the patients History and Physical, chart, labs and discussed the procedure including the risks, benefits and alternatives for the proposed anesthesia with the patient or authorized representative who has indicated his/her understanding and acceptance.     Plan Discussed with:   Anesthesia Plan Comments:         Anesthesia Quick Evaluation

## 2013-08-31 NOTE — Op Note (Signed)
Patient brought to the operating room and prepped and draped in the usual manner.  Lid speculum inserted in right eye.  Stab incision made at the twelve o'clock position.  Provisc instilled in the anterior chamber.   A 2.4 mm. Stab incision was made temporally.  An anterior capsulotomy was done with a bent 25 gauge needle.  The nucleus was hydrodissected.  The Phaco tip was inserted in the anterior chamber and the nucleus was emulsified.  CDE was 7.85.  The cortical material was then removed with the I and A tip.  Posterior capsule was the polished.  The anterior chamber was deepened with Provisc.  A 16.5 Diopter Rayner 570C IOL was then inserted in the capsular bag.  Provisc was then removed with the I and A tip.  The wound was then hydrated.  Patient sent to the Recovery Room in good condition with follow up in my office.  Preoperative Diagnosis:  Nuclear Cataract OD  Postoperative Diagnosis:  Same Procedure name: Kelman Phacoemulsification OD with IOL

## 2013-08-31 NOTE — Anesthesia Postprocedure Evaluation (Signed)
  Anesthesia Post-op Note  Patient: Allen [name]  Procedure(s) Performed: Procedure(s) with comments: CATARACT EXTRACTION PHACO AND INTRAOCULAR LENS PLACEMENT (IOC) (Right) - CDE:7.85  Patient Location: Short Stay  Anesthesia Type:MAC  Level of Consciousness: awake, alert  and oriented  Airway and Oxygen Therapy: Patient Spontanous Breathing  Post-op Pain: none  Post-op Assessment: Post-op Vital signs reviewed, Patient's Cardiovascular Status Stable, Respiratory Function Stable, Patent Airway and No signs of Nausea or vomiting  Post-op Vital Signs: Reviewed and stable  Complications: No apparent anesthesia complications

## 2013-09-01 ENCOUNTER — Encounter (HOSPITAL_COMMUNITY): Payer: Self-pay | Admitting: Ophthalmology

## 2013-09-07 ENCOUNTER — Encounter (HOSPITAL_COMMUNITY): Payer: Self-pay

## 2013-09-14 ENCOUNTER — Encounter (HOSPITAL_COMMUNITY)
Admission: RE | Disposition: A | Discharge status: Home / Self Care | Payer: Self-pay | Source: Ambulatory Visit | Attending: Ophthalmology

## 2013-09-14 ENCOUNTER — Encounter (HOSPITAL_COMMUNITY): Payer: Self-pay | Admitting: *Deleted

## 2013-09-14 ENCOUNTER — Ambulatory Visit (HOSPITAL_COMMUNITY)
Admission: RE | Admit: 2013-09-14 | Discharge: 2013-09-14 | Disposition: A | Discharge status: Home / Self Care | Payer: Medicare Other | Source: Ambulatory Visit | Attending: Ophthalmology | Admitting: Ophthalmology

## 2013-09-14 ENCOUNTER — Telehealth: Payer: Self-pay | Admitting: Family Medicine

## 2013-09-14 DIAGNOSIS — H26499 Other secondary cataract, unspecified eye: Secondary | ICD-10-CM | POA: Insufficient documentation

## 2013-09-14 HISTORY — PX: YAG LASER APPLICATION: SHX6189

## 2013-09-14 SURGERY — TREATMENT, USING YAG LASER
Anesthesia: LOCAL | Laterality: Left

## 2013-09-14 MED ORDER — FEXOFENADINE HCL 180 MG PO TABS: ORAL_TABLET | ORAL | Status: DC

## 2013-09-14 MED ORDER — TROPICAMIDE 1 % OP SOLN
1.0000 [drp] | OPHTHALMIC | Status: AC
  Administered 2013-09-14: 1 [drp] via OPHTHALMIC

## 2013-09-14 MED ORDER — TROPICAMIDE 1 % OP SOLN
OPHTHALMIC | Status: AC
  Filled 2013-09-14: qty 3

## 2013-09-14 NOTE — Telephone Encounter (Signed)
I sent in rx for generic allegra 180mg  once daily as needed.

## 2013-09-14 NOTE — Brief Op Note (Signed)
Allen Grealish T. Nile Riggs, MD  Procedure: Yag Capsulotomy  Yag Laser Self Test Completedyes. Procedure: Posterior Capsulotomy, Eye Protection Worn by Staff yes. Laser In Use Sign on Door yes.  Laser: Nd:YAG Spot Size: Fixed Burst Mode: III Power Setting: 3.4 mJ/burst Number of shots: 14 Total energy delivered: 46.9 mJ   The patient tolerated the procedure without difficulty. No complications were encountered.   The patient was discharged home with the instructions to continue all her current glaucoma medications, if any.   Patient instructed to go to office at 0100 for intraocular pressure check.  Patient verbalizes understanding of discharge instructions yes.    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

## 2013-09-14 NOTE — H&P (Signed)
The patient was re examined and there is no change in the patients condition since the original H and P. 

## 2013-09-14 NOTE — Telephone Encounter (Signed)
Patient states he has hives. He has had them off and on all his life. They seem to run in 7 year cycles. No known allergies. Patient is asking if there is an Rx he can get. Please contact patient.

## 2013-09-14 NOTE — Telephone Encounter (Signed)
Please advise 

## 2013-09-17 ENCOUNTER — Telehealth: Payer: Self-pay | Admitting: *Deleted

## 2013-09-17 NOTE — Telephone Encounter (Signed)
Patient left vm requesting a call back.  ?

## 2013-09-17 NOTE — Telephone Encounter (Signed)
This matter has already been taken care of by Dr. Milinda Cave.

## 2013-09-20 ENCOUNTER — Encounter (HOSPITAL_COMMUNITY): Payer: Self-pay | Admitting: Ophthalmology

## 2013-10-16 ENCOUNTER — Encounter: Payer: Self-pay | Admitting: Internal Medicine

## 2013-10-16 ENCOUNTER — Ambulatory Visit (INDEPENDENT_AMBULATORY_CARE_PROVIDER_SITE_OTHER): Payer: Medicare Other | Admitting: Internal Medicine

## 2013-10-16 VITALS — BP 142/70 | HR 70 | Temp 98.9°F | Ht 72.0 in | Wt 264.0 lb

## 2013-10-16 DIAGNOSIS — I10 Essential (primary) hypertension: Secondary | ICD-10-CM

## 2013-10-16 DIAGNOSIS — J209 Acute bronchitis, unspecified: Secondary | ICD-10-CM

## 2013-10-16 DIAGNOSIS — R062 Wheezing: Secondary | ICD-10-CM

## 2013-10-16 MED ORDER — HYDROCODONE-HOMATROPINE 5-1.5 MG/5ML PO SYRP: 5.0000 mL | ORAL_SOLUTION | Freq: Four times a day (QID) | ORAL | Status: DC | PRN

## 2013-10-16 MED ORDER — LEVOFLOXACIN 250 MG PO TABS: 250.0000 mg | ORAL_TABLET | Freq: Every day | ORAL | Status: DC

## 2013-10-16 MED ORDER — METHYLPREDNISOLONE ACETATE 80 MG/ML IJ SUSP
80.0000 mg | Freq: Once | INTRAMUSCULAR | Status: AC
  Administered 2013-10-16: 80 mg via INTRAMUSCULAR

## 2013-10-16 MED ORDER — MOMETASONE FURO-FORMOTEROL FUM 100-5 MCG/ACT IN AERO: 2.0000 | INHALATION_SPRAY | Freq: Two times a day (BID) | RESPIRATORY_TRACT | Status: DC

## 2013-10-16 NOTE — Assessment & Plan Note (Signed)
Mild to mod, for depomedrol IM, and sample dulera bid,  to f/u any worsening symptoms or concerns

## 2013-10-16 NOTE — Progress Notes (Signed)
Subjective:    Patient ID: Allen [name], male    DOB: 1946-10-16, 67 y.o.   MRN: [id number]  HPI Here with acute onset mild to mod 2-3 days ST, HA, general weakness and malaise, with prod cough greenish sputum, but Pt denies chest pain, increased sob or doe, wheezing, orthopnea, PND, increased LE swelling, palpitations, dizziness or syncope, except for onset mild sob and wheezing since yesterday am.   Pt denies polydipsia, polyuria, Pt denies new neurological symptoms such as new headache, or facial or extremity weakness or numbness Past Medical History  Diagnosis Date  . Hyperlipidemia   . Hypertension   . GERD (gastroesophageal reflux disease)   . CAD (coronary artery disease)     per pt report cth @ 2001 showed one vessel dz (approx 40% occl).  In records, stress testing 03/2009 NEG  for ischemia, +hypertensive bp response  . Chronic left shoulder pain   . Spinal cord trauma     c-spine  . Hiatal hernia   . Collagenous colitis 2002  . Cataract     bilateral, lens inplant in left eye  . Chronic neck pain    Past Surgical History  Procedure Laterality Date  . Tonsillectomy    . Carpal tunnel release    . Cataract extraction w/ intraocular lens implant      left  . Colonoscopy w/ polypectomy  2011    mild diverticulosis and 2 hyperplastic polyps (rpt 10 yrs)  . Cataract extraction w/phaco Right 08/31/2013    Procedure: CATARACT EXTRACTION PHACO AND INTRAOCULAR LENS PLACEMENT (IOC);  Surgeon: Loraine Leriche T. Nile Riggs, MD;  Location: [facility];  Service: Ophthalmology;  Laterality: Right;  CDE:7.85  . Yag laser application Left 09/14/2013    Procedure: YAG LASER APPLICATION;  Surgeon: Loraine Leriche T. Nile Riggs, MD;  Location: [facility];  Service: Ophthalmology;  Laterality: Left;    reports that he quit smoking about 15 years ago. His smoking use included Cigarettes. He has a 70 pack-year smoking history. He has never used smokeless tobacco. He reports that he does not drink alcohol or use illicit  drugs. family history includes Cancer in his mother; Colon polyps in his father. Allergies  Allergen Reactions  . Advil [Ibuprofen] Other (See Comments)    Lip swelling  . Penicillins Hives  . Sulfonamide Derivatives Hives   Current Outpatient Prescriptions on File Prior to Visit  Medication Sig Dispense Refill  . atorvastatin (LIPITOR) 20 MG tablet Take 20 mg by mouth daily.      . fexofenadine (ALLEGRA) 180 MG tablet 1 tab po qd prn hives  30 tablet  6  . lisinopril-hydrochlorothiazide (PRINZIDE,ZESTORETIC) 20-25 MG per tablet Take 1 tablet by mouth daily.  30 tablet  6   No current facility-administered medications on file prior to visit.   Review of Systems  Constitutional: Negative for unexpected weight change, or unusual diaphoresis  HENT: Negative for tinnitus.   Eyes: Negative for photophobia and visual disturbance.  Respiratory: Negative for choking and stridor.   Gastrointestinal: Negative for vomiting and blood in stool.  Genitourinary: Negative for hematuria and decreased urine volume.  Musculoskeletal: Negative for acute joint swelling Skin: Negative for color change and wound.  Neurological: Negative for tremors and numbness other than noted  Psychiatric/Behavioral: Negative for decreased concentration or  hyperactivity.       Objective:   Physical Exam BP 142/70  Pulse 70  Temp(Src) 98.9 F (37.2 C) (Oral)  Ht 6' (1.829 m)  Wt 264 lb (119.75  kg)  BMI 35.80 kg/m2  SpO2 96% VS noted, mild ill Constitutional: Pt appears well-developed and well-nourished.  HENT: Head: NCAT.  Right Ear: External ear normal.  Left Ear: External ear normal.  Bilat tm's with mild erythema.  Max sinus areas non tender.  Pharynx with mild erythema, no exudate Eyes: Conjunctivae and EOM are normal. Pupils are equal, round, and reactive to light.  Neck: Normal range of motion. Neck supple.  Cardiovascular: Normal rate and regular rhythm.   Pulmonary/Chest: Effort normal and breath  sounds decresed with few wheeze bilat Neurological: Pt is alert. Not confused  Skin: Skin is warm. No erythema.  Psychiatric: Pt behavior is normal. Thought content normal.     Assessment & Plan:

## 2013-10-16 NOTE — Assessment & Plan Note (Signed)
Mild to mod, for antibx course,  to f/u any worsening symptoms or concerns 

## 2013-10-16 NOTE — Patient Instructions (Signed)
You had the steroid shot today Please take all new medication as prescribed - the antibiotic, and cough medicine as needed Please also use the dulera sample given at one puff twice per day (lasts about 2 weeks) Please continue all other medications as before, and refills have been done if requested. Please have the pharmacy call with any other refills you may need.  Please remember to sign up for My Chart if you have not done so, as this will be important to you in the future with finding out test results, communicating by private email, and scheduling acute appointments online when needed.

## 2013-10-16 NOTE — Assessment & Plan Note (Signed)
stable overall by history and exam, recent data reviewed with pt, and pt to continue medical treatment as before,  to f/u any worsening symptoms or concerns BP Readings from Last 3 Encounters:  10/16/13 142/70  09/14/13 140/78  09/14/13 140/78

## 2013-10-16 NOTE — Progress Notes (Signed)
Pre-visit discussion using our clinic review tool. No additional management support is needed unless otherwise documented below in the visit note.  

## 2014-04-20 DIAGNOSIS — K760 Fatty (change of) liver, not elsewhere classified: Secondary | ICD-10-CM

## 2014-04-20 HISTORY — DX: Fatty (change of) liver, not elsewhere classified: K76.0

## 2014-04-20 HISTORY — PX: CARDIAC CATHETERIZATION: SHX172

## 2014-04-26 ENCOUNTER — Encounter (HOSPITAL_COMMUNITY): Payer: Self-pay | Admitting: Emergency Medicine

## 2014-04-26 ENCOUNTER — Emergency Department (HOSPITAL_COMMUNITY): Payer: Medicare Other

## 2014-04-26 ENCOUNTER — Inpatient Hospital Stay (HOSPITAL_COMMUNITY)
Admission: EM | Admit: 2014-04-26 | Discharge: 2014-04-27 | DRG: 287 | Disposition: A | Discharge status: Home / Self Care | Payer: Medicare Other | Attending: Internal Medicine | Admitting: Internal Medicine

## 2014-04-26 DIAGNOSIS — M542 Cervicalgia: Secondary | ICD-10-CM | POA: Diagnosis present

## 2014-04-26 DIAGNOSIS — I200 Unstable angina: Principal | ICD-10-CM | POA: Diagnosis present

## 2014-04-26 DIAGNOSIS — E785 Hyperlipidemia, unspecified: Secondary | ICD-10-CM | POA: Diagnosis present

## 2014-04-26 DIAGNOSIS — Z8249 Family history of ischemic heart disease and other diseases of the circulatory system: Secondary | ICD-10-CM

## 2014-04-26 DIAGNOSIS — Z87891 Personal history of nicotine dependence: Secondary | ICD-10-CM

## 2014-04-26 DIAGNOSIS — Z882 Allergy status to sulfonamides status: Secondary | ICD-10-CM

## 2014-04-26 DIAGNOSIS — Z886 Allergy status to analgesic agent status: Secondary | ICD-10-CM

## 2014-04-26 DIAGNOSIS — Z880 Allergy status to penicillin: Secondary | ICD-10-CM

## 2014-04-26 DIAGNOSIS — G8929 Other chronic pain: Secondary | ICD-10-CM | POA: Diagnosis present

## 2014-04-26 DIAGNOSIS — Z9119 Patient's noncompliance with other medical treatment and regimen: Secondary | ICD-10-CM

## 2014-04-26 DIAGNOSIS — K219 Gastro-esophageal reflux disease without esophagitis: Secondary | ICD-10-CM | POA: Diagnosis present

## 2014-04-26 DIAGNOSIS — I498 Other specified cardiac arrhythmias: Secondary | ICD-10-CM | POA: Diagnosis present

## 2014-04-26 DIAGNOSIS — I10 Essential (primary) hypertension: Secondary | ICD-10-CM | POA: Diagnosis present

## 2014-04-26 DIAGNOSIS — Z91199 Patient's noncompliance with other medical treatment and regimen due to unspecified reason: Secondary | ICD-10-CM

## 2014-04-26 DIAGNOSIS — I2510 Atherosclerotic heart disease of native coronary artery without angina pectoris: Secondary | ICD-10-CM

## 2014-04-26 LAB — BASIC METABOLIC PANEL
ANION GAP: 13 (ref 5–15)
BUN: 15 mg/dL (ref 6–23)
CALCIUM: 9.2 mg/dL (ref 8.4–10.5)
CO2: 27 meq/L (ref 19–32)
Chloride: 100 mEq/L (ref 96–112)
Creatinine, Ser: 0.82 mg/dL (ref 0.50–1.35)
GFR calc Af Amer: >90 mL/min (ref >90)
Glucose, Bld: 90 mg/dL (ref 70–99)
Potassium: 4.4 mEq/L (ref 3.7–5.3)
Sodium: 140 mEq/L (ref 137–147)

## 2014-04-26 LAB — CBC WITH DIFFERENTIAL/PLATELET
BASOS ABS: 0 10*3/uL (ref 0.0–0.1)
BASOS PCT: 1 % (ref 0–1)
EOS ABS: 0.2 10*3/uL (ref 0.0–0.7)
EOS PCT: 3 % (ref 0–5)
HCT: 43.1 % (ref 39.0–52.0)
Hemoglobin: 14.2 g/dL (ref 13.0–17.0)
LYMPHS PCT: 31 % (ref 12–46)
Lymphs Abs: 2 10*3/uL (ref 0.7–4.0)
MCH: 28.7 pg (ref 26.0–34.0)
MCHC: 32.9 g/dL (ref 30.0–36.0)
MCV: 87.2 fL (ref 78.0–100.0)
Monocytes Absolute: 0.4 10*3/uL (ref 0.1–1.0)
Monocytes Relative: 6 % (ref 3–12)
Neutro Abs: 3.9 10*3/uL (ref 1.7–7.7)
Neutrophils Relative %: 59 % (ref 43–77)
PLATELETS: 217 10*3/uL (ref 150–400)
RBC: 4.94 MIL/uL (ref 4.22–5.81)
RDW: 14.3 % (ref 11.5–15.5)
WBC: 6.5 10*3/uL (ref 4.0–10.5)

## 2014-04-26 LAB — TROPONIN I
Troponin I: <0.3 ng/mL (ref <0.30)
Troponin I: <0.3 ng/mL (ref <0.30)

## 2014-04-26 LAB — I-STAT TROPONIN, ED: Troponin i, poc: 0.02 ng/mL (ref 0.00–0.08)

## 2014-04-26 LAB — D-DIMER, QUANTITATIVE: D-Dimer, Quant: 0.54 ug/mL-FEU — ABNORMAL HIGH (ref 0.00–0.48)

## 2014-04-26 MED ORDER — HEPARIN (PORCINE) IN NACL 100-0.45 UNIT/ML-% IJ SOLN
1800.0000 [IU]/h | INTRAMUSCULAR | Status: DC
  Administered 2014-04-26: 1400 [IU]/h via INTRAVENOUS
  Administered 2014-04-27 (×2): 1800 [IU]/h via INTRAVENOUS
  Filled 2014-04-26 (×2): qty 250

## 2014-04-26 MED ORDER — ASPIRIN 81 MG PO CHEW
81.0000 mg | CHEWABLE_TABLET | ORAL | Status: AC
  Administered 2014-04-27: 81 mg via ORAL
  Filled 2014-04-26: qty 1

## 2014-04-26 MED ORDER — SODIUM CHLORIDE 0.9 % IJ SOLN
3.0000 mL | Freq: Two times a day (BID) | INTRAMUSCULAR | Status: DC
  Administered 2014-04-27: 3 mL via INTRAVENOUS

## 2014-04-26 MED ORDER — SODIUM CHLORIDE 0.9 % IV SOLN: 250.0000 mL | INTRAVENOUS | Status: DC | PRN

## 2014-04-26 MED ORDER — ACETAMINOPHEN 325 MG PO TABS: 650.0000 mg | ORAL_TABLET | ORAL | Status: DC | PRN

## 2014-04-26 MED ORDER — HEPARIN BOLUS VIA INFUSION
4000.0000 [IU] | Freq: Once | INTRAVENOUS | Status: AC
  Administered 2014-04-26: 4000 [IU] via INTRAVENOUS
  Filled 2014-04-26: qty 4000

## 2014-04-26 MED ORDER — NITROGLYCERIN 0.4 MG SL SUBL: 0.4000 mg | SUBLINGUAL_TABLET | SUBLINGUAL | Status: DC | PRN

## 2014-04-26 MED ORDER — SODIUM CHLORIDE 0.9 % IJ SOLN: 3.0000 mL | INTRAMUSCULAR | Status: DC | PRN

## 2014-04-26 MED ORDER — NITROGLYCERIN 2 % TD OINT
0.5000 [in_us] | TOPICAL_OINTMENT | Freq: Four times a day (QID) | TRANSDERMAL | Status: DC
  Administered 2014-04-26: 0.5 [in_us] via TOPICAL
  Filled 2014-04-26: qty 30

## 2014-04-26 MED ORDER — ATORVASTATIN CALCIUM 20 MG PO TABS
20.0000 mg | ORAL_TABLET | Freq: Every day | ORAL | Status: DC
  Administered 2014-04-27: 20 mg via ORAL
  Filled 2014-04-26: qty 1

## 2014-04-26 MED ORDER — HYDRALAZINE HCL 20 MG/ML IJ SOLN
10.0000 mg | INTRAMUSCULAR | Status: DC | PRN
Start: 2014-04-26 — End: 2014-04-27

## 2014-04-26 MED ORDER — ASPIRIN EC 81 MG PO TBEC
81.0000 mg | DELAYED_RELEASE_TABLET | Freq: Every day | ORAL | Status: DC
  Administered 2014-04-27: 81 mg via ORAL
  Filled 2014-04-26: qty 1

## 2014-04-26 MED ORDER — SODIUM CHLORIDE 0.9 % IV SOLN
INTRAVENOUS | Status: DC
  Administered 2014-04-27: 06:00:00 via INTRAVENOUS

## 2014-04-26 MED ORDER — LISINOPRIL-HYDROCHLOROTHIAZIDE 20-25 MG PO TABS: 1.0000 | ORAL_TABLET | Freq: Every day | ORAL | Status: DC

## 2014-04-26 MED ORDER — LISINOPRIL 20 MG PO TABS
20.0000 mg | ORAL_TABLET | Freq: Every day | ORAL | Status: DC
  Administered 2014-04-27: 20 mg via ORAL
  Filled 2014-04-26: qty 1

## 2014-04-26 MED ORDER — IOHEXOL 350 MG/ML SOLN
100.0000 mL | Freq: Once | INTRAVENOUS | Status: AC | PRN
  Administered 2014-04-26: 100 mL via INTRAVENOUS

## 2014-04-26 MED ORDER — ASPIRIN 81 MG PO CHEW
324.0000 mg | CHEWABLE_TABLET | Freq: Once | ORAL | Status: AC
  Administered 2014-04-26: 324 mg via ORAL
  Filled 2014-04-26: qty 4

## 2014-04-26 MED ORDER — HYDROCHLOROTHIAZIDE 25 MG PO TABS
25.0000 mg | ORAL_TABLET | Freq: Every day | ORAL | Status: DC
  Administered 2014-04-27: 25 mg via ORAL
  Filled 2014-04-26: qty 1

## 2014-04-26 MED ORDER — ONDANSETRON HCL 4 MG/2ML IJ SOLN: 4.0000 mg | Freq: Four times a day (QID) | INTRAMUSCULAR | Status: DC | PRN

## 2014-04-26 NOTE — H&P (Signed)
Patient ID: JALEEN FINCH MRN: [id number], DOB/AGE: [birth year]   Admit date: 04/26/2014   Primary Physician: Tammi Sou, MD Primary Cardiologist: None  Pt. Profile:  68 year old Caucasian male with past medical history significant for hypertension, hyperlipidemia, GERD, chronic left shoulder pain, and a history of coronary artery disease presented with CP and dyspnea  Problem List  Past Medical History  Diagnosis Date  . Hyperlipidemia   . Hypertension   . GERD (gastroesophageal reflux disease)   . CAD (coronary artery disease)     per pt report cth @ 2001 showed one vessel dz (approx 40% occl).  In records, stress testing 03/2009 NEG  for ischemia, +hypertensive bp response  . Chronic left shoulder pain   . Spinal cord trauma     c-spine  . Hiatal hernia   . Collagenous colitis 2002  . Cataract     bilateral, lens inplant in left eye  . Chronic neck pain     Past Surgical History  Procedure Laterality Date  . Tonsillectomy    . Carpal tunnel release    . Cataract extraction w/ intraocular lens implant      left  . Colonoscopy w/ polypectomy  2011    mild diverticulosis and 2 hyperplastic polyps (rpt 10 yrs)  . Cataract extraction w/phaco Right 08/31/2013    Procedure: CATARACT EXTRACTION PHACO AND INTRAOCULAR LENS PLACEMENT (IOC);  Surgeon: Elta Guadeloupe [name], MD;  Location: [facility];  Service: Ophthalmology;  Laterality: Right;  CDE:7.85  . Yag laser application Left 32/67/1245    Procedure: YAG LASER APPLICATION;  Surgeon: Elta Guadeloupe [name], MD;  Location: [facility];  Service: Ophthalmology;  Laterality: Left;     Allergies  Allergies  Allergen Reactions  . Advil [Ibuprofen] Other (See Comments)    Lip swelling  . Penicillins Hives  . Sulfonamide Derivatives Hives    HPI  The patient is a 68 year old Caucasian male with past medical history significant for hypertension, hyperlipidemia, GERD, chronic left shoulder pain, and a history of coronary artery  disease. According to the patient, he had a normal stress test and underwent cardiac catheterization on 11/21/1999 which showed no significant disease. He has not seen a cardiologist since that time. The last time he had a stress test was in June 2010 at which time he had negative treadmill stress test. According to the patient, he has not been compliant with his medication. He had to stop taking his Lipitor about 6 months ago and has not taken any blood pressure medication in the last 3 months after he run out of previous prescription. He has been having intermittent left-sided chest pressure since 6 months ago lasting 5-10 minutes each time. He states it is not associated with activity and it can occur both at rest and with exertion. He denies any worsening of chest pain with exertion. However his chest discomfort has been increasing frequency and now occurs 2-3 times a week. According to the patient, he continue to be active at home after recently adopted two kids. He went to the beach a week ago and had to carry heavy boxes, however did not notice any significant chest discomfort at that time. He denies any recent fever, chill, LE edema, orthopnea or PND. He does, however, has a chronic cough which he attributed to his chronic smoking history.   Patient woke up this morning feeling fine. He was sitting in the recliner around 10 am when he started experiencing L sided CP radiating to the back.  He states this is the first time it radiated to the back and this had him concerned. It was also associated with shortness breath, however no dizziness, nausea, presyncope or syncope. The chest pain lasted until he arrived in the ED around 1pm. He denies any exacerbating or alleviating factors including changing body position, deep inspiration or palpation. On arrival to the ED, he was noted to be hypertensive with systolic blood pressure ranging from 160 to 190s. O2 saturation normal.  Initial laboratory shows normal CBC  and BMP with creatinine of 0.82. Troponin negative x2. Elevated d-dimer of 0.54. Chest x-ray showed no acute disease. EKG showed normal sinus rhythm with heart rate of 70s, T wave inversion in the lateral leads. CTA of chest and abdomen were negative for PE, aneurysm or dissection. Cardiology was consulted for CP.    Home Medications  Prior to Admission medications   Medication Sig Start Date End Date Taking? Authorizing Provider  atorvastatin (LIPITOR) 20 MG tablet Take 20 mg by mouth daily. 02/17/13   Tammi Sou, MD  lisinopril-hydrochlorothiazide (PRINZIDE,ZESTORETIC) 20-25 MG per tablet Take 1 tablet by mouth daily. 02/17/13 02/17/14  Tammi Sou, MD  mometasone-formoterol (DULERA) 100-5 MCG/ACT AERO Inhale 2 puffs into the lungs 2 (two) times daily. 10/16/13   Biagio Borg, MD    Family History  Family History  Problem Relation Age of Onset  . Cancer Mother     uterine  . Colon polyps Father     Social History  History   Social History  . Marital Status: Married    Spouse Name: N/A    Number of Children: N/A  . Years of Education: N/A   Occupational History  . Not on file.   Social History Main Topics  . Smoking status: Former Smoker -- 2.00 packs/day for 35 years    Types: Cigarettes    Quit date: 10/21/1997  . Smokeless tobacco: Never Used  . Alcohol Use: No  . Drug Use: No  . Sexual Activity: Yes    Birth Control/ Protection: None   Other Topics Concern  . Not on file   Social History Narrative  . No narrative on file     Review of Systems General:  No chills, fever, night sweats or weight changes.  Cardiovascular:  No edema, orthopnea, palpitations, paroxysmal nocturnal dyspnea. +chest pain, dyspnea on exertion Dermatological: No rash, lesions/masses Respiratory: No dyspnea +chronic cough Urologic: No hematuria, dysuria Abdominal:   No nausea, vomiting, diarrhea, bright red blood per rectum, melena, or hematemesis Neurologic:  No visual  changes, wkns, changes in mental status. All other systems reviewed and are otherwise negative except as noted above.  Physical Exam  Blood pressure 171/82, pulse 60, temperature 98.1 F (36.7 C), temperature source Oral, resp. rate 17, SpO2 100.00%.  General: Pleasant, NAD Psych: Normal affect.  Neuro: Alert and oriented X 3. Moves all extremities spontaneously. HEENT: Normal  Neck: Supple without bruits or JVD. Lungs:  Resp regular and unlabored, CTA. Heart: RRR no s3, s4, or murmurs. Abdomen: Soft, non-tender, non-distended, BS + x 4.  Extremities: No clubbing, cyanosis or edema. DP/PT/Radials 2+ and equal bilaterally.  Labs  Troponin [facility] of Care Test)  Recent Labs  04/26/14 1500  TROPIPOC 0.02    Recent Labs  04/26/14 1746  TROPONINI <0.30   Lab Results  Component Value Date   WBC 6.5 04/26/2014   HGB 14.2 04/26/2014   HCT 43.1 04/26/2014   MCV 87.2 04/26/2014   PLT  217 04/26/2014    Recent Labs Lab 04/26/14 1440  NA 140  K 4.4  CL 100  CO2 27  BUN 15  CREATININE 0.82  CALCIUM 9.2  GLUCOSE 90   Lab Results  Component Value Date   CHOL 191 08/08/2011   HDL 39.70 08/08/2011   LDLCALC 123* 08/08/2011   TRIG 144.0 08/08/2011   Lab Results  Component Value Date   DDIMER 0.54* 04/26/2014     Radiology/Studies  Dg Chest 2 View  04/26/2014   CLINICAL DATA:  Midsternal chest pain extending to back for 2 days.  EXAM: CHEST  2 VIEW  COMPARISON:  04/07/2013.  FINDINGS: No infiltrate, congestive heart failure or pneumothorax.  Heart size within normal limits.  Mild degenerative changes thoracic spine with osteophyte.  No plain film evidence of aortic dissection. If this were of concern, CT imaging may be considered.  Mild right acromioclavicular joint degenerative changes.  IMPRESSION: No acute abnormality detected by plain film exam.  Please see above.   Electronically Signed   By: Chauncey Cruel M.D.   On: 04/26/2014 14:41   Ct Angio Chest Aortic Dissect W &/or  W/o  04/26/2014   CLINICAL DATA:  Midsternal chest pain radiating between the shoulders for 2 days.  EXAM: CT ANGIOGRAPHY CHEST, ABDOMEN AND PELVIS  TECHNIQUE: Multidetector CT imaging through the chest, abdomen and pelvis was performed using the standard protocol during bolus administration of intravenous contrast. Multiplanar reconstructed images and MIPs were obtained and reviewed to evaluate the vascular anatomy.  CONTRAST:  148mL OMNIPAQUE IOHEXOL 350 MG/ML SOLN  COMPARISON:  Chest radiographs 04/26/2014 and 04/07/2013.  FINDINGS: CTA CHEST FINDINGS  Pre contrast images demonstrate no displaced intimal calcifications within thoracic aorta. There is atherosclerosis of the aorta, great vessels and coronary arteries. Post-contrast, there is no evidence of dissection or aneurysm. There is no evidence of mediastinal hematoma.  The pulmonary arteries are well opacified with contrast. There is no evidence of acute pulmonary embolism.  No enlarged mediastinal, hilar or axillary lymph nodes are present. There is no pleural or pericardial effusion.  The lungs are clear aside from a tiny subpleural nodule in the left lower lobe on image 85, likely incidental.  Review of the MIP images confirms the above findings.  CTA ABDOMEN AND PELVIS FINDINGS  The abdominal aorta is normal in caliber without evidence of aneurysm or dissection. There is diffuse atherosclerosis of the aorta, its branches and the iliac arteries. The celiac trunk, superior and inferior mesenteric arteries are widely patent. The renal arteries are duplicated bilaterally but patent. There is no evidence of retroperitoneal hematoma.  The liver demonstrates low density consistent with steatosis. No focal hepatic abnormalities are identified. The spleen, gallbladder, pancreas, adrenal glands and kidneys appear normal.  The stomach, small bowel and appendix appear normal. There are diffuse diverticular changes of the sigmoid colon without surrounding  inflammation. The bladder, prostate gland and seminal vesicles appear normal. There is symmetric prominent fat within both inguinal canals.  There are mild degenerative changes within the spine. Unilateral left-sided pars defect is noted at L5.  Review of the MIP images confirms the above findings.  IMPRESSION: 1. No evidence of aortic dissection or aneurysm. 2. Diffuse atherosclerosis as described. No evidence of large vessel occlusion. 3. Hepatic steatosis and sigmoid diverticulosis. No acute abdominal pelvic findings.   Electronically Signed   By: Camie Patience M.D.   On: 04/26/2014 19:06   Ct Cta Abd/pel W/cm &/or W/o Cm  04/26/2014  CLINICAL DATA:  Midsternal chest pain radiating between the shoulders for 2 days.  EXAM: CT ANGIOGRAPHY CHEST, ABDOMEN AND PELVIS  TECHNIQUE: Multidetector CT imaging through the chest, abdomen and pelvis was performed using the standard protocol during bolus administration of intravenous contrast. Multiplanar reconstructed images and MIPs were obtained and reviewed to evaluate the vascular anatomy.  CONTRAST:  133mL OMNIPAQUE IOHEXOL 350 MG/ML SOLN  COMPARISON:  Chest radiographs 04/26/2014 and 04/07/2013.  FINDINGS: CTA CHEST FINDINGS  Pre contrast images demonstrate no displaced intimal calcifications within thoracic aorta. There is atherosclerosis of the aorta, great vessels and coronary arteries. Post-contrast, there is no evidence of dissection or aneurysm. There is no evidence of mediastinal hematoma.  The pulmonary arteries are well opacified with contrast. There is no evidence of acute pulmonary embolism.  No enlarged mediastinal, hilar or axillary lymph nodes are present. There is no pleural or pericardial effusion.  The lungs are clear aside from a tiny subpleural nodule in the left lower lobe on image 85, likely incidental.  Review of the MIP images confirms the above findings.  CTA ABDOMEN AND PELVIS FINDINGS  The abdominal aorta is normal in caliber without evidence  of aneurysm or dissection. There is diffuse atherosclerosis of the aorta, its branches and the iliac arteries. The celiac trunk, superior and inferior mesenteric arteries are widely patent. The renal arteries are duplicated bilaterally but patent. There is no evidence of retroperitoneal hematoma.  The liver demonstrates low density consistent with steatosis. No focal hepatic abnormalities are identified. The spleen, gallbladder, pancreas, adrenal glands and kidneys appear normal.  The stomach, small bowel and appendix appear normal. There are diffuse diverticular changes of the sigmoid colon without surrounding inflammation. The bladder, prostate gland and seminal vesicles appear normal. There is symmetric prominent fat within both inguinal canals.  There are mild degenerative changes within the spine. Unilateral left-sided pars defect is noted at L5.  Review of the MIP images confirms the above findings.  IMPRESSION: 1. No evidence of aortic dissection or aneurysm. 2. Diffuse atherosclerosis as described. No evidence of large vessel occlusion. 3. Hepatic steatosis and sigmoid diverticulosis. No acute abdominal pelvic findings.   Electronically Signed   By: Camie Patience M.D.   On: 04/26/2014 19:06    ECG  EKG NSR with heart rate of 70s, T wave inversion in the lateral leads  ASSESSMENT AND PLAN  1. Chest pain/unstable angina  - significant family h/o early coronary dx: mother had 1st MI at age 8s, father first MI in 63s  - consider patient's risk factors and previous known CAD, plan for cardiac cath  2. CAD  - cath 11/21/1999 RCA dominant, EF>65%, 20-30% prox LAD, 20-30% prox LCx, 30-40% prox RCA, 30% diffuse RCA stenosis.  3. Hypertension: uncontrolled due to noncompliance  - will need better follow up and BP control  - add BB given h/o CAD  4. Hyperlipidemia 5. GERD 6. Chronic left shoulder pain 7. Former smoker 8. Noncompliance  - stopped statin 23mo ago, stopped antihypertensive 3 mo  ago 9. Elevated d-dimer: negative CTA of abd and chest   Signed, Almyra Deforest, PA-C 04/26/2014, 7:56 PM  Patient seen and examined with Almyra Deforest, PA-C. We discussed all aspects of the encounter. I agree with the assessment and plan as stated above.   CP concerning for [place]. Will admit. Rule out MI with serial enzymes. Start heparin, asa, b-blocker and statin. Cath tomorrow. Risks/indications discussed. Will work on BP control as well.   Benay Spice 8:36  PM

## 2014-04-26 NOTE — ED Notes (Signed)
States he ran out of BP medications x3 weeks ago and has not returned to PCP to get prescription refill.

## 2014-04-26 NOTE — ED Notes (Signed)
Attempted report 

## 2014-04-26 NOTE — ED Provider Notes (Signed)
CSN: [id number]     Arrival date & time 04/26/14  1355 History   First MD Initiated Contact with Patient 04/26/14 1724     Chief Complaint  Patient presents with  . Chest Pain     (Consider location/radiation/quality/duration/timing/severity/associated sxs/prior Treatment) HPI Comments: Patient presents with 2 day history of left-sided chest pain that is constant and radiates to his back intermittently. Is associated with shortness of breath and nausea and generalized weakness. He admits he is having pain for several months become worse in the past several days. He states his blood pressure medications for several months. Patient denies any cardiac history but says he had a catheterization 15 years ago that showed 40% occlusion. He had negative stress test in 2010. He does not have any stents. His cardiologist. The pain is not exertional. Nothing makes it better or worse. It radiates to his upper back intermittently and is associated with shortness of breath and diaphoresis.  The history is provided by the patient.    Past Medical History  Diagnosis Date  . Hyperlipidemia   . Hypertension   . GERD (gastroesophageal reflux disease)   . CAD (coronary artery disease)     per pt report cth @ 2001 showed one vessel dz (approx 40% occl).  In records, stress testing 03/2009 NEG  for ischemia, +hypertensive bp response  . Chronic left shoulder pain   . Spinal cord trauma     c-spine  . Hiatal hernia   . Collagenous colitis 2002  . Cataract     bilateral, lens inplant in left eye  . Chronic neck pain    Past Surgical History  Procedure Laterality Date  . Tonsillectomy    . Carpal tunnel release    . Cataract extraction w/ intraocular lens implant      left  . Colonoscopy w/ polypectomy  2011    mild diverticulosis and 2 hyperplastic polyps (rpt 10 yrs)  . Cataract extraction w/phaco Right 08/31/2013    Procedure: CATARACT EXTRACTION PHACO AND INTRAOCULAR LENS PLACEMENT (IOC);  Surgeon:  Elta Guadeloupe [name], MD;  Location: [facility];  Service: Ophthalmology;  Laterality: Right;  CDE:7.85  . Yag laser application Left 78/29/5621    Procedure: YAG LASER APPLICATION;  Surgeon: Elta Guadeloupe [name], MD;  Location: [facility];  Service: Ophthalmology;  Laterality: Left;   Family History  Problem Relation Age of Onset  . Cancer Mother     uterine  . Colon polyps Father   . Heart attack Mother     started in her 39s, died at age 78  . Heart attack Father     started in his 45s   History  Substance Use Topics  . Smoking status: Former Smoker -- 2.00 packs/day for 35 years    Types: Cigarettes    Quit date: 10/21/1997  . Smokeless tobacco: Never Used  . Alcohol Use: No    Review of Systems  Constitutional: Negative for fever, activity change and appetite change.  Respiratory: Positive for chest tightness and shortness of breath.   Cardiovascular: Positive for chest pain.  Gastrointestinal: Negative for nausea, vomiting and abdominal pain.  Genitourinary: Negative for dysuria and hematuria.  Musculoskeletal: Positive for back pain. Negative for arthralgias and myalgias.  Skin: Negative for rash.  Neurological: Positive for dizziness and light-headedness. Negative for weakness and headaches.  A complete 10 system review of systems was obtained and all systems are negative except as noted in the HPI and PMH.  Allergies  Advil; Penicillins; and Sulfonamide derivatives  Home Medications   Prior to Admission medications   Medication Sig Start Date End Date Taking? Authorizing Provider  atorvastatin (LIPITOR) 20 MG tablet Take 20 mg by mouth daily. 02/17/13   Tammi Sou, MD  lisinopril-hydrochlorothiazide (PRINZIDE,ZESTORETIC) 20-25 MG per tablet Take 1 tablet by mouth daily. 02/17/13 02/17/14  Tammi Sou, MD  mometasone-formoterol (DULERA) 100-5 MCG/ACT AERO Inhale 2 puffs into the lungs 2 (two) times daily. 10/16/13   Biagio Borg, MD   BP 164/78  Pulse 62  Temp(Src)  97.6 F (36.4 C) (Oral)  Resp 22  Ht 6' (1.829 m)  Wt 254 lb 8 oz (115.44 kg)  BMI 34.51 kg/m2  SpO2 98% Physical Exam  Nursing note and vitals reviewed. Constitutional: He is oriented to person, place, and time. He appears well-developed and well-nourished. No distress.  Hypertensive  HENT:  Head: Normocephalic and atraumatic.  Mouth/Throat: Oropharynx is clear and moist. No oropharyngeal exudate.  Eyes: Conjunctivae and EOM are normal. Pupils are equal, round, and reactive to light.  Neck: Normal range of motion. Neck supple.  No meningismus.  Cardiovascular: Normal rate, regular rhythm, normal heart sounds and intact distal pulses.   No murmur heard. Pulmonary/Chest: Effort normal and breath sounds normal. No respiratory distress.  Chest wall nontender  Abdominal: Soft. There is no tenderness. There is no rebound and no guarding.  Musculoskeletal: Normal range of motion. He exhibits no edema and no tenderness.  Neurological: He is alert and oriented to person, place, and time. No cranial nerve deficit. He exhibits normal muscle tone. Coordination normal.  No ataxia on finger to nose bilaterally. No pronator drift. 5/5 strength throughout. CN 2-12 intact. Negative Romberg. Equal grip strength. Sensation intact. Gait is normal.   Skin: Skin is warm.  Psychiatric: He has a normal mood and affect. His behavior is normal.    ED Course  Procedures (including critical care time) Labs Review Labs Reviewed  D-DIMER, QUANTITATIVE - Abnormal; Notable for the following:    D-Dimer, Quant 0.54 (*)    All other components within normal limits  CBC WITH DIFFERENTIAL  BASIC METABOLIC PANEL  TROPONIN I  TROPONIN I  HEPARIN LEVEL (UNFRACTIONATED)  CBC  TROPONIN I  TROPONIN I  TSH  COMPREHENSIVE METABOLIC PANEL  LIPID PANEL  I-STAT TROPOININ, ED    Imaging Review Dg Chest 2 View  04/26/2014   CLINICAL DATA:  Midsternal chest pain extending to back for 2 days.  EXAM: CHEST  2 VIEW   COMPARISON:  04/07/2013.  FINDINGS: No infiltrate, congestive heart failure or pneumothorax.  Heart size within normal limits.  Mild degenerative changes thoracic spine with osteophyte.  No plain film evidence of aortic dissection. If this were of concern, CT imaging may be considered.  Mild right acromioclavicular joint degenerative changes.  IMPRESSION: No acute abnormality detected by plain film exam.  Please see above.   Electronically Signed   By: Chauncey Cruel M.D.   On: 04/26/2014 14:41   Ct Angio Chest Aortic Dissect W &/or W/o  04/26/2014   CLINICAL DATA:  Midsternal chest pain radiating between the shoulders for 2 days.  EXAM: CT ANGIOGRAPHY CHEST, ABDOMEN AND PELVIS  TECHNIQUE: Multidetector CT imaging through the chest, abdomen and pelvis was performed using the standard protocol during bolus administration of intravenous contrast. Multiplanar reconstructed images and MIPs were obtained and reviewed to evaluate the vascular anatomy.  CONTRAST:  129mL OMNIPAQUE IOHEXOL 350 MG/ML SOLN  COMPARISON:  Chest radiographs 04/26/2014 and 04/07/2013.  FINDINGS: CTA CHEST FINDINGS  Pre contrast images demonstrate no displaced intimal calcifications within thoracic aorta. There is atherosclerosis of the aorta, great vessels and coronary arteries. Post-contrast, there is no evidence of dissection or aneurysm. There is no evidence of mediastinal hematoma.  The pulmonary arteries are well opacified with contrast. There is no evidence of acute pulmonary embolism.  No enlarged mediastinal, hilar or axillary lymph nodes are present. There is no pleural or pericardial effusion.  The lungs are clear aside from a tiny subpleural nodule in the left lower lobe on image 85, likely incidental.  Review of the MIP images confirms the above findings.  CTA ABDOMEN AND PELVIS FINDINGS  The abdominal aorta is normal in caliber without evidence of aneurysm or dissection. There is diffuse atherosclerosis of the aorta, its branches and  the iliac arteries. The celiac trunk, superior and inferior mesenteric arteries are widely patent. The renal arteries are duplicated bilaterally but patent. There is no evidence of retroperitoneal hematoma.  The liver demonstrates low density consistent with steatosis. No focal hepatic abnormalities are identified. The spleen, gallbladder, pancreas, adrenal glands and kidneys appear normal.  The stomach, small bowel and appendix appear normal. There are diffuse diverticular changes of the sigmoid colon without surrounding inflammation. The bladder, prostate gland and seminal vesicles appear normal. There is symmetric prominent fat within both inguinal canals.  There are mild degenerative changes within the spine. Unilateral left-sided pars defect is noted at L5.  Review of the MIP images confirms the above findings.  IMPRESSION: 1. No evidence of aortic dissection or aneurysm. 2. Diffuse atherosclerosis as described. No evidence of large vessel occlusion. 3. Hepatic steatosis and sigmoid diverticulosis. No acute abdominal pelvic findings.   Electronically Signed   By: Camie Patience M.D.   On: 04/26/2014 19:06   Ct Cta Abd/pel W/cm &/or W/o Cm  04/26/2014   CLINICAL DATA:  Midsternal chest pain radiating between the shoulders for 2 days.  EXAM: CT ANGIOGRAPHY CHEST, ABDOMEN AND PELVIS  TECHNIQUE: Multidetector CT imaging through the chest, abdomen and pelvis was performed using the standard protocol during bolus administration of intravenous contrast. Multiplanar reconstructed images and MIPs were obtained and reviewed to evaluate the vascular anatomy.  CONTRAST:  136mL OMNIPAQUE IOHEXOL 350 MG/ML SOLN  COMPARISON:  Chest radiographs 04/26/2014 and 04/07/2013.  FINDINGS: CTA CHEST FINDINGS  Pre contrast images demonstrate no displaced intimal calcifications within thoracic aorta. There is atherosclerosis of the aorta, great vessels and coronary arteries. Post-contrast, there is no evidence of dissection or aneurysm.  There is no evidence of mediastinal hematoma.  The pulmonary arteries are well opacified with contrast. There is no evidence of acute pulmonary embolism.  No enlarged mediastinal, hilar or axillary lymph nodes are present. There is no pleural or pericardial effusion.  The lungs are clear aside from a tiny subpleural nodule in the left lower lobe on image 85, likely incidental.  Review of the MIP images confirms the above findings.  CTA ABDOMEN AND PELVIS FINDINGS  The abdominal aorta is normal in caliber without evidence of aneurysm or dissection. There is diffuse atherosclerosis of the aorta, its branches and the iliac arteries. The celiac trunk, superior and inferior mesenteric arteries are widely patent. The renal arteries are duplicated bilaterally but patent. There is no evidence of retroperitoneal hematoma.  The liver demonstrates low density consistent with steatosis. No focal hepatic abnormalities are identified. The spleen, gallbladder, pancreas, adrenal glands and kidneys appear normal.  The stomach,  small bowel and appendix appear normal. There are diffuse diverticular changes of the sigmoid colon without surrounding inflammation. The bladder, prostate gland and seminal vesicles appear normal. There is symmetric prominent fat within both inguinal canals.  There are mild degenerative changes within the spine. Unilateral left-sided pars defect is noted at L5.  Review of the MIP images confirms the above findings.  IMPRESSION: 1. No evidence of aortic dissection or aneurysm. 2. Diffuse atherosclerosis as described. No evidence of large vessel occlusion. 3. Hepatic steatosis and sigmoid diverticulosis. No acute abdominal pelvic findings.   Electronically Signed   By: Camie Patience M.D.   On: 04/26/2014 19:06     EKG Interpretation   Date/Time:  Tuesday April 26 2014 14:04:55 EDT Ventricular Rate:  71 PR Interval:  144 QRS Duration: 112 QT Interval:  404 QTC Calculation: 439 R Axis:   26 Text  Interpretation:  Normal sinus rhythm Nonspecific T wave abnormality  Abnormal ECG No previous ECGs available Confirmed by Doyline  [phone]) on 04/26/2014 5:24:59 PM      MDM   Final diagnoses:  Unstable angina   2 history of intermittent substernal chest pain radiating to the upper back. EKG with T wave inversions laterally, no comparison.  Noncompliant with BP meds. Chest x-ray negative. Troponin negative.  Patient chest pain-free in the ED.  With pain that radiates to the back, aortic dissection was ruled out by CT scan.  Concerning for angina. Patient has known coronary disease. Discussed with Dr. Marlou Porch of cardiology who will evaluate.  Cardiology planning admission for catheterization tomorrow.  Patient has remained chest pain free in the ED.  Ezequiel Essex, MD 04/27/14 [phone]

## 2014-04-26 NOTE — ED Notes (Signed)
Pt presents to department for evaluation of midsternal chest pain radiating to back between shoulders. 6/10 pain upon arrival. Ongoing x2 days, states pain became worse today. Respirations unlabored. Also states lightheadedness and generalized weakness. Pt is alert and oriented x4.

## 2014-04-26 NOTE — Progress Notes (Signed)
ANTICOAGULATION CONSULT NOTE - Initial Consult  Pharmacy Consult for heparin Indication: chest pain/ACS  Allergies  Allergen Reactions  . Advil [Ibuprofen] Other (See Comments)    Lip swelling  . Penicillins Hives  . Sulfonamide Derivatives Hives    Patient Measurements:   Heparin Dosing Weight: 103kg  Vital Signs: Temp: 98.1 F (36.7 C) (07/07 1900) Temp src: Oral (07/07 1900) BP: 177/68 mmHg (07/07 2000) Pulse Rate: 65 (07/07 2000)  Labs:  Recent Labs  04/26/14 1440 04/26/14 1746  HGB 14.2  --   HCT 43.1  --   PLT 217  --   CREATININE 0.82  --   TROPONINI  --  <0.30    The CrCl is unknown because both a height and weight (above a minimum accepted value) are required for this calculation.   Medical History: Past Medical History  Diagnosis Date  . Hyperlipidemia   . Hypertension   . GERD (gastroesophageal reflux disease)   . CAD (coronary artery disease)     per pt report cth @ 2001 showed one vessel dz (approx 40% occl).  In records, stress testing 03/2009 NEG  for ischemia, +hypertensive bp response  . Chronic left shoulder pain   . Spinal cord trauma     c-spine  . Hiatal hernia   . Collagenous colitis 2002  . Cataract     bilateral, lens inplant in left eye  . Chronic neck pain     Medications:  Infusions:  . heparin    . heparin      Assessment: 70 yom presented to the ED with CP. To start IV heparin for anticoagulation. Baseline CBC is WNL and he is not on any anticoagulation PTA.   Goal of Therapy:  Heparin level 0.3-0.7 units/ml Monitor platelets by anticoagulation protocol: Yes   Plan:  1. Heparin bolus 4000 units IV x 1 2. Heparin gtt 1400 units/hr 3. Check a 6 hour heparin level 4. Daily heparin level and CBC  Jamella Grayer, Rande Lawman 04/26/2014,8:49 PM

## 2014-04-26 NOTE — ED Notes (Signed)
Patient to ED with C/O chest pain that radiated to his back that has been ongoing for 2 weeks.  Patient states that pain is intermittent.  He also states that he has not taken his BP meds as he should.  Denies pain at this time.

## 2014-04-27 ENCOUNTER — Encounter (HOSPITAL_COMMUNITY)
Admission: EM | Disposition: A | Discharge status: Home / Self Care | Payer: Self-pay | Source: Home / Self Care | Attending: Internal Medicine

## 2014-04-27 DIAGNOSIS — E785 Hyperlipidemia, unspecified: Secondary | ICD-10-CM

## 2014-04-27 DIAGNOSIS — I2510 Atherosclerotic heart disease of native coronary artery without angina pectoris: Secondary | ICD-10-CM

## 2014-04-27 HISTORY — PX: LEFT HEART CATHETERIZATION WITH CORONARY ANGIOGRAM: SHX5451

## 2014-04-27 LAB — COMPREHENSIVE METABOLIC PANEL
ALBUMIN: 3.4 g/dL — AB (ref 3.5–5.2)
ALT: 12 U/L (ref 0–53)
AST: 13 U/L (ref 0–37)
Alkaline Phosphatase: 74 U/L (ref 39–117)
Anion gap: 13 (ref 5–15)
BILIRUBIN TOTAL: 0.3 mg/dL (ref 0.3–1.2)
BUN: 16 mg/dL (ref 6–23)
CHLORIDE: 102 meq/L (ref 96–112)
CO2: 23 meq/L (ref 19–32)
CREATININE: 0.89 mg/dL (ref 0.50–1.35)
Calcium: 9 mg/dL (ref 8.4–10.5)
GFR calc Af Amer: >90 mL/min (ref >90)
GFR calc non Af Amer: 88 mL/min — ABNORMAL LOW (ref >90)
Glucose, Bld: 106 mg/dL — ABNORMAL HIGH (ref 70–99)
POTASSIUM: 4.4 meq/L (ref 3.7–5.3)
SODIUM: 138 meq/L (ref 137–147)
Total Protein: 6.8 g/dL (ref 6.0–8.3)

## 2014-04-27 LAB — LIPID PANEL
CHOL/HDL RATIO: 6.1 ratio
Cholesterol: 239 mg/dL — ABNORMAL HIGH (ref 0–200)
HDL: 39 mg/dL — ABNORMAL LOW (ref >39)
LDL CALC: 148 mg/dL — AB (ref 0–99)
Triglycerides: 262 mg/dL — ABNORMAL HIGH (ref <150)
VLDL: 52 mg/dL — AB (ref 0–40)

## 2014-04-27 LAB — PROTIME-INR
INR: 1.05 (ref 0.00–1.49)
Prothrombin Time: 13.7 seconds (ref 11.6–15.2)

## 2014-04-27 LAB — CBC
HCT: 40.6 % (ref 39.0–52.0)
HEMOGLOBIN: 13.2 g/dL (ref 13.0–17.0)
MCH: 28.9 pg (ref 26.0–34.0)
MCHC: 32.5 g/dL (ref 30.0–36.0)
MCV: 88.8 fL (ref 78.0–100.0)
Platelets: 194 10*3/uL (ref 150–400)
RBC: 4.57 MIL/uL (ref 4.22–5.81)
RDW: 14.4 % (ref 11.5–15.5)
WBC: 6.2 10*3/uL (ref 4.0–10.5)

## 2014-04-27 LAB — HEPARIN LEVEL (UNFRACTIONATED): HEPARIN UNFRACTIONATED: 0.15 [IU]/mL — AB (ref 0.30–0.70)

## 2014-04-27 LAB — TSH: TSH: 2.19 u[IU]/mL (ref 0.350–4.500)

## 2014-04-27 LAB — TROPONIN I

## 2014-04-27 SURGERY — LEFT HEART CATHETERIZATION WITH CORONARY ANGIOGRAM
Anesthesia: LOCAL

## 2014-04-27 MED ORDER — FENTANYL CITRATE 0.05 MG/ML IJ SOLN
INTRAMUSCULAR | Status: AC
  Filled 2014-04-27: qty 2

## 2014-04-27 MED ORDER — SODIUM CHLORIDE 0.9 % IV SOLN: 1.0000 mL/kg/h | INTRAVENOUS | Status: DC

## 2014-04-27 MED ORDER — MIDAZOLAM HCL 2 MG/2ML IJ SOLN
INTRAMUSCULAR | Status: AC
  Filled 2014-04-27: qty 2

## 2014-04-27 MED ORDER — LISINOPRIL-HYDROCHLOROTHIAZIDE 20-25 MG PO TABS: 1.0000 | ORAL_TABLET | Freq: Every day | ORAL | Status: DC

## 2014-04-27 MED ORDER — PNEUMOCOCCAL VAC POLYVALENT 25 MCG/0.5ML IJ INJ: 0.5000 mL | INJECTION | INTRAMUSCULAR | Status: DC

## 2014-04-27 MED ORDER — NITROGLYCERIN 0.2 MG/ML ON CALL CATH LAB
INTRAVENOUS | Status: AC
  Filled 2014-04-27: qty 1

## 2014-04-27 MED ORDER — ATORVASTATIN CALCIUM 20 MG PO TABS: 20.0000 mg | ORAL_TABLET | Freq: Every day | ORAL | Status: DC

## 2014-04-27 MED ORDER — HEPARIN BOLUS VIA INFUSION
3000.0000 [IU] | Freq: Once | INTRAVENOUS | Status: AC
  Administered 2014-04-27: 3000 [IU] via INTRAVENOUS
  Filled 2014-04-27: qty 3000

## 2014-04-27 MED ORDER — HEPARIN (PORCINE) IN NACL 2-0.9 UNIT/ML-% IJ SOLN
INTRAMUSCULAR | Status: AC
  Filled 2014-04-27: qty 1000

## 2014-04-27 MED ORDER — HEPARIN SODIUM (PORCINE) 1000 UNIT/ML IJ SOLN
INTRAMUSCULAR | Status: AC
  Filled 2014-04-27: qty 1

## 2014-04-27 MED ORDER — ASPIRIN 81 MG PO TABS: 81.0000 mg | ORAL_TABLET | Freq: Every day | ORAL | Status: DC

## 2014-04-27 MED ORDER — LIDOCAINE HCL (PF) 1 % IJ SOLN
INTRAMUSCULAR | Status: AC
  Filled 2014-04-27: qty 30

## 2014-04-27 NOTE — Interval H&P Note (Signed)
History and Physical Interval Note:  04/27/2014 11:14 AM  Allen [name]  has presented today for surgery, with the diagnosis of chest pain  The various methods of treatment have been discussed with the patient and family. After consideration of risks, benefits and other options for treatment, the patient has consented to  Procedure(s): LEFT HEART CATHETERIZATION WITH CORONARY ANGIOGRAM (N/A) as a surgical intervention .  The patient's history has been reviewed, patient examined, no change in status, stable for surgery.  I have reviewed the patient's chart and labs.  Questions were answered to the patient's satisfaction.    Cath Lab Visit (complete for each Cath Lab visit)  Clinical Evaluation Leading to the Procedure:   ACS: Yes.    Non-ACS:    Anginal Classification: CCS IV  Anti-ischemic medical therapy: No Therapy  Non-Invasive Test Results: No non-invasive testing performed  Prior CABG: No previous CABG       Sherren Mocha

## 2014-04-27 NOTE — Discharge Instructions (Signed)
PLEASE REMEMBER TO BRING ALL OF YOUR MEDICATIONS TO EACH OF YOUR FOLLOW-UP OFFICE VISITS. ° °PLEASE ATTEND ALL SCHEDULED FOLLOW-UP APPOINTMENTS.  ° °Activity: Increase activity slowly as tolerated. You may shower, but no soaking baths (or swimming) for 1 week. No driving for 2 days. No lifting over 5 lbs for 1 week. No sexual activity for 1 week.  ° °You May Return to Work: in 1 week (if applicable) ° °Wound Care: You may wash cath site gently with soap and water. Keep cath site clean and dry. If you notice pain, swelling, bleeding or pus at your cath site, please call [phone]. ° ° ° °Cardiac Cath Site Care °Refer to this sheet in the next few weeks. These instructions provide you with information on caring for yourself after your procedure. Your caregiver may also give you more specific instructions. Your treatment has been planned according to current medical practices, but problems sometimes occur. Call your caregiver if you have any problems or questions after your procedure. °HOME CARE INSTRUCTIONS °· You may shower 24 hours after the procedure. Remove the bandage (dressing) and gently wash the site with plain soap and water. Gently pat the site dry.  °· Do not apply powder or lotion to the site.  °· Do not sit in a bathtub, swimming pool, or whirlpool for 5 to 7 days.  °· No bending, squatting, or lifting anything over 10 pounds (4.5 kg) as directed by your caregiver.  °· Inspect the site at least twice daily.  °· Do not drive home if you are discharged the same day of the procedure. Have someone else drive you.  °· You may drive 24 hours after the procedure unless otherwise instructed by your caregiver.  °What to expect: °· Any bruising will usually fade within 1 to 2 weeks.  °· Blood that collects in the tissue (hematoma) may be painful to the touch. It should usually decrease in size and tenderness within 1 to 2 weeks.  °SEEK IMMEDIATE MEDICAL CARE IF: °· You have unusual pain at the site or down the  affected limb.  °· You have redness, warmth, swelling, or pain at the site.  °· You have drainage (other than a small amount of blood on the dressing).  °· You have chills.  °· You have a fever or persistent symptoms for more than 72 hours.  °· You have a fever and your symptoms suddenly get worse.  °· Your leg becomes pale, cool, tingly, or numb.  °· You have heavy bleeding from the site. Hold pressure on the site.  °Document Released: 11/09/2010 Document Revised: 09/26/2011 Document Reviewed: 11/09/2010 °[employer]® Patient Information ©2012 [employer]. ° °

## 2014-04-27 NOTE — Progress Notes (Signed)
ANTICOAGULATION CONSULT NOTE - Follow Up Consult  Pharmacy Consult for heparin Indication: chest pain/ACS  Labs:  Recent Labs  04/26/14 1440 04/26/14 1746 04/26/14 2253 04/27/14 0357 04/27/14 0406 04/27/14 0415  HGB 14.2  --   --   --  13.2  --   HCT 43.1  --   --   --  40.6  --   PLT 217  --   --   --  194  --   LABPROT  --   --   --   --   --  13.7  INR  --   --   --   --   --  1.05  HEPARINUNFRC  --   --   --  0.15*  --   --   CREATININE 0.82  --   --   --   --   --   TROPONINI  --  <0.30 <0.30  --   --   --     Assessment: 68yo old male subtherapeutic on heparin with initial dosing for CP/USAP.  Goal of Therapy:  Heparin level 0.3-0.7 units/ml   Plan:  Will rebolus with heparin 3000 units and increase gtt by 3-4 units/kg/hr to 1800 units/hr and check level in 6hr.  Wynona Neat, PharmD, BCPS  04/27/2014,4:44 AM

## 2014-04-27 NOTE — Discharge Summary (Signed)
CARDIOLOGY DISCHARGE SUMMARY   Patient ID: Allen [name] MRN: [id number] DOB/AGE: 1946-05-01 68 y.o.  Admit date: 04/26/2014 Discharge date: 04/27/2014  PCP: Tammi Sou, MD Primary Cardiologist: Dr. Radford Pax  Primary Discharge Diagnosis: Intermediate coronary syndrome  Secondary Discharge Diagnosis:    Unstable angina    HTN    Hyperlipidemia  Procedures: Left Heart Cath, Selective Coronary Angiography, LV angiography, CT of the chest, abdomen and pelvis   Hospital Course: KIAAN OVERHOLSER is a 68 y.o. male with a history of non-obstructive CAD by remote cath. He had chest pain concerning for unstable angina. He came to the hospital, where he was admitted for further evaluation and treatment.   His cardiac enzymes were negative for MI. His symptoms were concerning for an ischemic cause, so cardiac catheterization was performed on 07/08.  Results are below, he had minimal non-obstructive disease with normal LV systolic function. Dr. Burt Knack reviewed all data and recommended ASA 81 mg, restarting his BP medication and a low-dose statin with a low cholesterol diet. He had been out of his BP and cholesterol medications prior to admission. He is given a 1 month prescription for these and is to follow up with Dr. Anitra Lauth for additional management.   Labs:   Lab Results  Component Value Date   WBC 6.2 04/27/2014   HGB 13.2 04/27/2014   HCT 40.6 04/27/2014   MCV 88.8 04/27/2014   PLT 194 04/27/2014     Recent Labs Lab 04/27/14 0406  NA 138  K 4.4  CL 102  CO2 23  BUN 16  CREATININE 0.89  CALCIUM 9.0  PROT 6.8  BILITOT 0.3  ALKPHOS 74  ALT 12  AST 13  GLUCOSE 106*    Recent Labs  04/26/14 1746 04/26/14 2253 04/27/14 1232  TROPONINI <0.30 <0.30 <0.30   Lipid Panel     Component Value Date/Time   CHOL 239* 04/27/2014 0406   TRIG 262* 04/27/2014 0406   HDL 39* 04/27/2014 0406   CHOLHDL 6.1 04/27/2014 0406   VLDL 52* 04/27/2014 0406   LDLCALC 148* 04/27/2014 0406     Recent Labs  04/27/14 0415  INR 1.05      Radiology: Dg Chest 2 View 04/26/2014   CLINICAL DATA:  Midsternal chest pain extending to back for 2 days.  EXAM: CHEST  2 VIEW  COMPARISON:  04/07/2013.  FINDINGS: No infiltrate, congestive heart failure or pneumothorax.  Heart size within normal limits.  Mild degenerative changes thoracic spine with osteophyte.  No plain film evidence of aortic dissection. If this were of concern, CT imaging may be considered.  Mild right acromioclavicular joint degenerative changes.  IMPRESSION: No acute abnormality detected by plain film exam.  Please see above.   Electronically Signed   By: Chauncey Cruel M.D.   On: 04/26/2014 14:41   Ct Cta Abd/pel W/cm &/or W/o Cm 04/26/2014   CLINICAL DATA:  Midsternal chest pain radiating between the shoulders for 2 days.  EXAM: CT ANGIOGRAPHY CHEST, ABDOMEN AND PELVIS  TECHNIQUE: Multidetector CT imaging through the chest, abdomen and pelvis was performed using the standard protocol during bolus administration of intravenous contrast. Multiplanar reconstructed images and MIPs were obtained and reviewed to evaluate the vascular anatomy.  CONTRAST:  138mL OMNIPAQUE IOHEXOL 350 MG/ML SOLN  COMPARISON:  Chest radiographs 04/26/2014 and 04/07/2013.  FINDINGS: CTA CHEST FINDINGS  Pre contrast images demonstrate no displaced intimal calcifications within thoracic aorta. There is atherosclerosis of the aorta, great vessels and coronary  arteries. Post-contrast, there is no evidence of dissection or aneurysm. There is no evidence of mediastinal hematoma.  The pulmonary arteries are well opacified with contrast. There is no evidence of acute pulmonary embolism.  No enlarged mediastinal, hilar or axillary lymph nodes are present. There is no pleural or pericardial effusion.  The lungs are clear aside from a tiny subpleural nodule in the left lower lobe on image 85, likely incidental.  Review of the MIP images confirms the above findings.    CTA  ABDOMEN AND PELVIS FINDINGS  The abdominal aorta is normal in caliber without evidence of aneurysm or dissection. There is diffuse atherosclerosis of the aorta, its branches and the iliac arteries. The celiac trunk, superior and inferior mesenteric arteries are widely patent. The renal arteries are duplicated bilaterally but patent. There is no evidence of retroperitoneal hematoma.  The liver demonstrates low density consistent with steatosis. No focal hepatic abnormalities are identified. The spleen, gallbladder, pancreas, adrenal glands and kidneys appear normal.  The stomach, small bowel and appendix appear normal. There are diffuse diverticular changes of the sigmoid colon without surrounding inflammation. The bladder, prostate gland and seminal vesicles appear normal. There is symmetric prominent fat within both inguinal canals.  There are mild degenerative changes within the spine. Unilateral left-sided pars defect is noted at L5.  Review of the MIP images confirms the above findings.  IMPRESSION: 1. No evidence of aortic dissection or aneurysm. 2. Diffuse atherosclerosis as described. No evidence of large vessel occlusion. 3. Hepatic steatosis and sigmoid diverticulosis. No acute abdominal pelvic findings.   Electronically Signed   By: Camie Patience M.D.   On: 04/26/2014 19:06    Cardiac Cath: 04/27/2014 Left mainstem: The left main stem is widely patent with no obstructive disease identified. There is minor calcification noted. The vessel divides into the LAD and left circumflex.  Left anterior descending (LAD): The LAD is patent. The proximal vessel has mild nonobstructive disease noted. The diagonal branches are patent. The LAD reaches the left ventricular apex and has no significant obstructive disease throughout its distribution.  Left circumflex (LCx): The left circumflex is patent. There is a single obtuse marginal branch without significant disease. The OM branch is large in caliber. There are  diffuse irregularities but no evidence of high-grade stenosis noted.  Right coronary artery (RCA): The RCA is heavily calcified. The vessel has an irregular 40-50% stenosis in its midportion. The distal vessel also has a focal 40-50% stenosis. There were no severe stenoses noted. The PDA and PLA branches are patent  Left ventriculography: Left ventricular systolic function is normal, LVEF is estimated at 55%, there is no significant mitral regurgitation  Final Conclusions:  1. Minimal nonobstructive CAD in the LAD and left circumflex.  2. Mild to moderate nonobstructive CAD involving the right coronary artery  3. Normal LV systolic function  Recommendations: Recommend continued medical therapy. The patient has been off of his statin drug for over 6 months. He should be on low-dose aspirin and a daily statin drug. And I suspect he is having noncardiac chest pain as there are no high-grade stenoses present and he presented with resting chest discomfort.  EKG: 04/27/2014 SR, no acute ischemic changes Vent. rate 60 BPM PR interval 180 ms QRS duration 114 ms QT/QTc 432/432 ms P-R-T axes 64 13 61  FOLLOW UP PLANS AND APPOINTMENTS Allergies  Allergen Reactions  . Advil [Ibuprofen] Other (See Comments)    Lip swelling  . Penicillins Hives  . Sulfonamide Derivatives Hives  Medication List         aspirin 81 MG tablet  Take 1 tablet (81 mg total) by mouth daily.     atorvastatin 20 MG tablet  Commonly known as:  LIPITOR  Take 1 tablet (20 mg total) by mouth daily.     lisinopril-hydrochlorothiazide 20-25 MG per tablet  Commonly known as:  PRINZIDE,ZESTORETIC  Take 1 tablet by mouth daily.     mometasone-formoterol 100-5 MCG/ACT Aero  Commonly known as:  DULERA  Inhale 2 puffs into the lungs 2 (two) times daily.        Discharge Instructions   Diet - low sodium heart healthy    Complete by:  As directed      Increase activity slowly    Complete by:  As directed            Follow-up Information   Schedule an appointment as soon as possible for a visit with Tammi Sou, MD.   Specialty:  Family Medicine   Contact information:   [address] [place] [address] [phone]       Follow up with [facility]. (As needed)    Contact information:   [place] [phone]       BRING ALL MEDICATIONS WITH YOU TO FOLLOW UP APPOINTMENTS  Time spent with patient to include physician time: 44 min Signed: Rosaria Ferries, PA-C 04/27/2014, 4:57 PM Co-Sign MD

## 2014-04-27 NOTE — CV Procedure (Signed)
    Cardiac Catheterization Procedure Note  Name: Allen [name] MRN: [id number] DOB: 02-12-1946  Procedure: Left Heart Cath, Selective Coronary Angiography, LV angiography  Indication: Chest pain c/w USAP. The patient has a history of nonobstructive CAD back in 2001 when he underwent cardiac catheterization. He had resting chest pain yesterday concerning for unstable angina and cardiac catheterization was recommended for definitive evaluation.   Procedural Details: The right wrist was prepped, draped, and anesthetized with 1% lidocaine. Using the modified Seldinger technique, a 5 French sheath was introduced into the right radial artery. 3 mg of verapamil was administered through the sheath, weight-based unfractionated heparin was administered intravenously. Standard Judkins catheters were used for selective coronary angiography and left ventriculography. Catheter exchanges were performed over an exchange length guidewire. There were no immediate procedural complications. A TR band was used for radial hemostasis at the completion of the procedure.  The patient was transferred to the post catheterization recovery area for further monitoring.  Procedural Findings: Hemodynamics: AO 156/82 LV 156/19  Coronary angiography: Coronary dominance: right  Left mainstem: The left main stem is widely patent with no obstructive disease identified. There is minor calcification noted. The vessel divides into the LAD and left circumflex.  Left anterior descending (LAD): The LAD is patent. The proximal vessel has mild nonobstructive disease noted. The diagonal branches are patent. The LAD reaches the left ventricular apex and has no significant obstructive disease throughout its distribution.  Left circumflex (LCx): The left circumflex is patent. There is a single obtuse marginal branch without significant disease. The OM branch is large in caliber. There are diffuse irregularities but no evidence of  high-grade stenosis noted.  Right coronary artery (RCA): The RCA is heavily calcified. The vessel has an irregular 40-50% stenosis in its midportion. The distal vessel also has a focal 40-50% stenosis. There were no severe stenoses noted. The PDA and PLA branches are patent  Left ventriculography: Left ventricular systolic function is normal, LVEF is estimated at 55%, there is no significant mitral regurgitation   Final Conclusions:   1. Minimal nonobstructive CAD in the LAD and left circumflex. 2. Mild to moderate nonobstructive CAD involving the right coronary artery 3. Normal LV systolic function  Recommendations: Recommend continued medical therapy. The patient has been off of his statin drug for over 6 months. He should be on low-dose aspirin and a daily statin drug. And I suspect he is having noncardiac chest pain as there are no high-grade stenoses present and he presented with resting chest discomfort.  Sherren Mocha 04/27/2014, 11:43 AM

## 2014-04-27 NOTE — Progress Notes (Signed)
Patient Profile: 68 year old Caucasian male with past medical history significant for hypertension, hyperlipidemia, GERD, chronic left shoulder pain, and a history of coronary artery disease presented with CP and dyspnea.  Past Medical History  Diagnosis Date  . Hyperlipidemia   . Hypertension   . GERD (gastroesophageal reflux disease)   . CAD (coronary artery disease)     per pt report cth @ 2001 showed one vessel dz (approx 40% occl).  In records, stress testing 03/2009 NEG  for ischemia, +hypertensive bp response  . Chronic left shoulder pain   . Spinal cord trauma     c-spine  . Hiatal hernia   . Collagenous colitis 2002  . Cataract     bilateral, lens inplant in left eye  . Chronic neck pain     Subjective: He denies further CP. No SOB.   Objective: Vital signs in last 24 hours: Temp:  [97.6 F (36.4 C)-98.7 F (37.1 C)] 98.2 F (36.8 C) (07/08 0421) Pulse Rate:  [57-72] 59 (07/08 0421) Resp:  [15-23] 20 (07/08 0421) BP: (153-198)/(68-112) 153/74 mmHg (07/08 0421) SpO2:  [97 %-100 %] 97 % (07/08 0421) Weight:  [254 lb 8 oz (115.44 kg)-254 lb 14.4 oz (115.622 kg)] 254 lb 14.4 oz (115.622 kg) (07/08 0421) Last BM Date: 04/25/14  Intake/Output from previous day:   Intake/Output this shift:    Medications Current Facility-Administered Medications  Medication Dose Route Frequency Provider Last Rate Last Dose  . 0.9 %  sodium chloride infusion  250 mL Intravenous PRN Rogelia Mire, NP      . 0.9 %  sodium chloride infusion  250 mL Intravenous PRN Rogelia Mire, NP      . 0.9 %  sodium chloride infusion   Intravenous Continuous Rogelia Mire, NP 100 mL/hr at 04/27/14 0608    . acetaminophen (TYLENOL) tablet 650 mg  650 mg Oral Q4H PRN Rogelia Mire, NP      . aspirin EC tablet 81 mg  81 mg Oral Daily Rogelia Mire, NP      . atorvastatin (LIPITOR) tablet 20 mg  20 mg Oral Daily Rogelia Mire, NP      . heparin ADULT infusion 100  units/mL (25000 units/250 mL)  1,800 Units/hr Intravenous Continuous Rogue Bussing, [facility] 18 mL/hr at 04/27/14 0607 1,800 Units/hr at 04/27/14 0607  . hydrALAZINE (APRESOLINE) injection 10 mg  10 mg Intravenous Q4H PRN Rogelia Mire, NP      . hydrochlorothiazide (HYDRODIURIL) tablet 25 mg  25 mg Oral Daily Jolaine Artist, MD      . lisinopril (PRINIVIL,ZESTRIL) tablet 20 mg  20 mg Oral Daily Jolaine Artist, MD      . nitroGLYCERIN (NITROGLYN) 2 % ointment 0.5 inch  0.5 inch Topical 4 times per day Rogelia Mire, NP   0.5 inch at 04/26/14 2327  . nitroGLYCERIN (NITROSTAT) SL tablet 0.4 mg  0.4 mg Sublingual Q5 Min x 3 PRN Rogelia Mire, NP      . ondansetron [facility]) injection 4 mg  4 mg Intravenous Q6H PRN Rogelia Mire, NP      . Derrill Memo ON 04/28/2014] pneumococcal 23 valent vaccine (PNU-IMMUNE) injection 0.5 mL  0.5 mL Intramuscular Tomorrow-1000 Shaune Pascal Bensimhon, MD      . sodium chloride 0.9 % injection 3 mL  3 mL Intravenous Q12H Rogelia Mire, NP      . sodium chloride 0.9 % injection 3 mL  3 mL Intravenous  PRN Rogelia Mire, NP      . sodium chloride 0.9 % injection 3 mL  3 mL Intravenous Q12H Rogelia Mire, NP      . sodium chloride 0.9 % injection 3 mL  3 mL Intravenous PRN Rogelia Mire, NP        PE: General appearance: alert, cooperative, no distress and moderately obese Lungs: clear to auscultation bilaterally Heart: regular rate and rhythm, S1, S2 normal, no murmur, click, rub or gallop Extremities: no LEE Pulses: 2+ and symmetric Skin: warm and dry Neurologic: Grossly normal  Lab Results:   Recent Labs  04/26/14 1440 04/27/14 0406  WBC 6.5 6.2  HGB 14.2 13.2  HCT 43.1 40.6  PLT 217 194   BMET  Recent Labs  04/26/14 1440 04/27/14 0406  NA 140 138  K 4.4 4.4  CL 100 102  CO2 27 23  GLUCOSE 90 106*  BUN 15 16  CREATININE 0.82 0.89  CALCIUM 9.2 9.0   PT/INR  Recent Labs  04/27/14 0415    LABPROT 13.7  INR 1.05   Cholesterol  Recent Labs  04/27/14 0406  CHOL 239*   Cardiac Panel (last 3 results)  Recent Labs  04/26/14 1746 04/26/14 2253  TROPONINI <0.30 <0.30    Studies/Results: CT of chest 04/26/14 IMPRESSION:  1. No evidence of aortic dissection or aneurysm.  2. Diffuse atherosclerosis as described. No evidence of large vessel  occlusion.  3. Hepatic steatosis and sigmoid diverticulosis. No acute abdominal  pelvic findings.   The pulmonary arteries are well opacified with contrast. There is no  evidence of acute pulmonary embolism.   Assessment/Plan  Active Problems:   Intermediate coronary syndrome   Unstable angina  1. Chest Pain: Currently CP free on IV heparin and nitro patch. Cardiac enzymes negative x 2. CT of chest unremarkable. Plan for diagnostic LHC +/- PCI today. Renal function is stable. INR is WNL.  2. HTN: moderately hypertensive at 153/74. Continue lisinopril and HCTZ. No BB due to bradycardia.   3. HLD: he has been noncompliant with statin medication for the last 6 months. Lipid panel reveals elevated LDL of 148 and high triglycerides at 262. Continue Lipitor.    LOS: 1 day    Brittainy M. Rosita Fire, PA-C 04/27/2014 8:12 AM

## 2014-04-27 NOTE — Care Management Note (Signed)
    Page 1 of 1   04/27/2014     3:39:30 PM CARE MANAGEMENT NOTE 04/27/2014  Patient:  Allen [name], Allen [name]   Account Number:  [id number]  Date Initiated:  04/27/2014  Documentation initiated by:  GRAVES-BIGELOW,Jaevion Goto  Subjective/Objective Assessment:   Pt admitted for cp. S/p Left Heart Cath, Selective Coronary Angiography, LV angiography.     Action/Plan:   No needs from CM at this time.   Anticipated DC Date:  04/28/2014   Anticipated DC Plan:  [facility]  CM consult      Choice offered to / List presented to:             Status of service:  Completed, signed off Medicare Important Message given?  NA - LOS <3 / Initial given by admissions (If response is "NO", the following Medicare IM given date fields will be blank) Date Medicare IM given:   Medicare IM given by:   Date Additional Medicare IM given:   Additional Medicare IM given by:    Discharge Disposition:  HOME/SELF CARE  Per UR Regulation:  Reviewed for med. necessity/level of care/duration of stay  If discussed at [place] of Stay Meetings, dates discussed:    Comments:

## 2014-04-27 NOTE — Progress Notes (Signed)
Patient seen and examined and agree with note as outlined by Lyda Jester, PA-C.  Admitted with CP and history of nonobstructive ASCAD in the past.  For left heart cath today to define coronary anatomy.

## 2014-04-27 NOTE — Progress Notes (Signed)
Right radial TR band removed without complications.  2x2 and tegaderm applied.  Patient given radial site instructions.  Will continue to monitor.  Sanda Linger

## 2014-04-29 ENCOUNTER — Encounter: Payer: Self-pay | Admitting: Family Medicine

## 2014-04-29 ENCOUNTER — Ambulatory Visit (HOSPITAL_BASED_OUTPATIENT_CLINIC_OR_DEPARTMENT_OTHER): Payer: Medicare Other

## 2014-04-29 ENCOUNTER — Ambulatory Visit (INDEPENDENT_AMBULATORY_CARE_PROVIDER_SITE_OTHER): Payer: Medicare Other | Admitting: Family Medicine

## 2014-04-29 ENCOUNTER — Ambulatory Visit (HOSPITAL_BASED_OUTPATIENT_CLINIC_OR_DEPARTMENT_OTHER)
Admission: RE | Admit: 2014-04-29 | Discharge: 2014-04-29 | Disposition: A | Discharge status: Home / Self Care | Payer: Medicare Other | Source: Ambulatory Visit | Attending: Family Medicine | Admitting: Family Medicine

## 2014-04-29 VITALS — BP 129/82 | HR 77 | Temp 97.9°F | Resp 18 | Ht 71.0 in | Wt 254.0 lb

## 2014-04-29 DIAGNOSIS — Z125 Encounter for screening for malignant neoplasm of prostate: Secondary | ICD-10-CM

## 2014-04-29 DIAGNOSIS — E010 Iodine-deficiency related diffuse (endemic) goiter: Secondary | ICD-10-CM

## 2014-04-29 DIAGNOSIS — I10 Essential (primary) hypertension: Secondary | ICD-10-CM

## 2014-04-29 DIAGNOSIS — E049 Nontoxic goiter, unspecified: Secondary | ICD-10-CM | POA: Insufficient documentation

## 2014-04-29 DIAGNOSIS — Z0389 Encounter for observation for other suspected diseases and conditions ruled out: Secondary | ICD-10-CM

## 2014-04-29 DIAGNOSIS — M654 Radial styloid tenosynovitis [de Quervain]: Secondary | ICD-10-CM

## 2014-04-29 DIAGNOSIS — E785 Hyperlipidemia, unspecified: Secondary | ICD-10-CM

## 2014-04-29 DIAGNOSIS — I2510 Atherosclerotic heart disease of native coronary artery without angina pectoris: Secondary | ICD-10-CM

## 2014-04-29 DIAGNOSIS — Z0000 Encounter for general adult medical examination without abnormal findings: Secondary | ICD-10-CM

## 2014-04-29 LAB — TSH: TSH: 1.74 u[IU]/mL (ref 0.35–4.50)

## 2014-04-29 LAB — T4, FREE: FREE T4: 0.73 ng/dL (ref 0.60–1.60)

## 2014-04-29 LAB — PSA, MEDICARE: PSA: 0.87 ng/mL (ref 0.10–4.00)

## 2014-04-29 NOTE — Patient Instructions (Signed)
Allen [name]: give pt directions/address to med center [facility]

## 2014-04-29 NOTE — Progress Notes (Signed)
Office Note 05/01/2014  CC:  Chief Complaint  Patient presents with  . Hospitalization Follow-up   HPI:  Allen [name] is a 68 y.o. White male who is here to f/u recent hospitalization for chest pain and for annual CPE. Reviewed hosp records today.  He did not rule in for MI.   Cath nonobst dz, EF normal, med mgmt restarted: statin and ASA + antihypertensive. Still has some left sided CP that comes and goes with lifting or different positioning of his upper body, admits to doing lots of lifting for yard sales lately.  Only other complaint is pain in right thumb intermittently.  No injury or swelling.  Dental: has dentures. Eyes: just had cataract surgery as recently as 08/2013.  He walks daily, eats low carb diet.  Wt is down.  Past Medical History  Diagnosis Date  . Hyperlipidemia   . Hypertension   . GERD (gastroesophageal reflux disease)   . CAD (coronary artery disease)     per pt report cth @ 2001 showed one vessel dz (approx 40% occl).  In records, stress testing 03/2009 [facility]  for ischemia, +hypertensive bp response.  CP admission 04/2014: cath was fine, EF normal  . Chronic left shoulder pain   . Spinal cord trauma     c-spine  . Hiatal hernia   . Collagenous colitis 2002  . Cataract     bilateral, lens inplant in left eye  . Chronic neck pain   . Hepatic steatosis 04/2014    Noted on CT abd/pelv  . Former smoker quit 1999    70 pack-yr hx    Past Surgical History  Procedure Laterality Date  . Tonsillectomy    . Carpal tunnel release    . Cataract extraction w/ intraocular lens implant      left  . Colonoscopy w/ polypectomy  2011    mild diverticulosis and 2 hyperplastic polyps (rpt 10 yrs)  . Cataract extraction w/phaco Right 08/31/2013    Procedure: CATARACT EXTRACTION PHACO AND INTRAOCULAR LENS PLACEMENT (IOC);  Surgeon: Elta Guadeloupe [name], MD;  Location: [facility];  Service: Ophthalmology;  Laterality: Right;  CDE:7.85  . Yag laser application Left  08/67/6195    Procedure: YAG LASER APPLICATION;  Surgeon: Elta Guadeloupe [name], MD;  Location: [facility];  Service: Ophthalmology;  Laterality: Left;  . Cardiac catheterization  04/2014    nonobstructive in LAD and circumflex--med mgmt    Family History  Problem Relation Age of Onset  . Cancer Mother     uterine  . Colon polyps Father   . Heart attack Mother     started in her 68s, died at age 78  . Heart attack Father     started in his 63s    History   Social History  . Marital Status: Married    Spouse Name: N/A    Number of Children: N/A  . Years of Education: N/A   Occupational History  . Not on file.   Social History Main Topics  . Smoking status: Former Smoker -- 2.00 packs/day for 35 years    Types: Cigarettes    Quit date: 10/21/1997  . Smokeless tobacco: Never Used  . Alcohol Use: No  . Drug Use: No  . Sexual Activity: Yes    Birth Control/ Protection: None   Other Topics Concern  . Not on file   Social History Narrative   Married, 6 kids (2 adopted).   Retired Engineer, structural.   Former smoker.  No signif alc.     No drugs.         MEDS: not on dulera listed below Outpatient Prescriptions Prior to Visit  Medication Sig Dispense Refill  . aspirin 81 MG tablet Take 1 tablet (81 mg total) by mouth daily.      [name] atorvastatin (LIPITOR) 20 MG tablet Take 1 tablet (20 mg total) by mouth daily.  30 tablet  0  . lisinopril-hydrochlorothiazide (PRINZIDE,ZESTORETIC) 20-25 MG per tablet Take 1 tablet by mouth daily.  30 tablet  0  . mometasone-formoterol (DULERA) 100-5 MCG/ACT AERO Inhale 2 puffs into the lungs 2 (two) times daily.  1 Inhaler  0   No facility-administered medications prior to visit.    Allergies  Allergen Reactions  . Advil [Ibuprofen] Other (See Comments)    Lip swelling  . Penicillins Hives  . Sulfonamide Derivatives Hives   ROS Review of Systems  Constitutional: Negative for fever, chills, appetite change and fatigue.  HENT: Negative for  congestion, dental problem, ear pain and sore throat.   Eyes: Negative for discharge, redness and visual disturbance.  Respiratory: Negative for cough, chest tightness, shortness of breath and wheezing.   Cardiovascular: Negative for chest pain, palpitations and leg swelling.  Gastrointestinal: Negative for nausea, vomiting, abdominal pain, diarrhea and blood in stool.  Genitourinary: Negative for dysuria, urgency, frequency, hematuria, flank pain and difficulty urinating.       Nocturia 3-4 x/night  Musculoskeletal: Negative for arthralgias, back pain, joint swelling, myalgias and neck stiffness.  Skin: Negative for pallor and rash.  Neurological: Negative for dizziness, speech difficulty, weakness and headaches.  Hematological: Negative for adenopathy. Does not bruise/bleed easily.  Psychiatric/Behavioral: Negative for confusion and sleep disturbance. The patient is not nervous/anxious.     PE; Blood pressure 129/82, pulse 77, temperature 97.9 F (36.6 C), temperature source Temporal, resp. rate 18, height 5\' 11"  (1.803 m), weight 254 lb (115.214 kg), SpO2 94.00%. Gen: Alert, well appearing.  Patient is oriented to person, place, time, and situation. AFFECT: pleasant, lucid thought and speech. ENT: Ears: EACs clear, normal epithelium.  TMs with good light reflex and landmarks bilaterally.  Eyes: no injection, icteris, swelling, or exudate.  EOMI, PERRLA. Nose: no drainage or turbinate edema/swelling.  No injection or focal lesion.  Mouth: lips without lesion/swelling.  Oral mucosa pink and moist.  Dentition intact and without obvious caries or gingival swelling.  Oropharynx without erythema, exudate, or swelling.  Neck: supple/nontender.  No LAD or mass.  I do feel fullness in the region of the right lobe of the thyroid that feels like distinct thyroid lobe enlargement.  No induration or tenderness.  Carotid pulses 2+ bilaterally, without bruits. CV: RRR, no m/r/g.   LUNGS: CTA bilat,  nonlabored resps, good aeration in all lung fields. ABD: soft, NT, ND, BS normal.  No hepatospenomegaly or mass.  No bruits. EXT: no clubbing, cyanosis, or edema.  Musculoskeletal: no joint swelling, erythema, warmth, or tenderness.  ROM of all joints intact. Right thumb with tenderness along extensor and abductor tendon complex as it crosses the radial styloid.  No thumb joint tenderness, erythema, or warmth.   Skin - no sores or suspicious lesions or rashes or color changes Rectal exam: negative without mass, lesions or tenderness, PROSTATE EXAM: smooth and symmetric without nodules or tenderness.   Pertinent labs:  None today In hosp recently: Lab Results  Component Value Date   WBC 6.2 04/27/2014   HGB 13.2 04/27/2014   HCT 40.6 04/27/2014  MCV 88.8 04/27/2014   PLT 194 04/27/2014   Lab Results  Component Value Date   CHOL 239* 04/27/2014   HDL 39* 04/27/2014   LDLCALC 148* 04/27/2014   LDLDIRECT 173.0 04/13/2010   TRIG 262* 04/27/2014   CHOLHDL 6.1 04/27/2014     Chemistry      Component Value Date/Time   NA 138 04/27/2014 0406   K 4.4 04/27/2014 0406   CL 102 04/27/2014 0406   CO2 23 04/27/2014 0406   BUN 16 04/27/2014 0406   CREATININE 0.89 04/27/2014 0406      Component Value Date/Time   CALCIUM 9.0 04/27/2014 0406   ALKPHOS 74 04/27/2014 0406   AST 13 04/27/2014 0406   ALT 12 04/27/2014 0406   BILITOT 0.3 04/27/2014 0406     Lab Results  Component Value Date   TSH 1.74 04/29/2014    ASSESSMENT AND PLAN:   HYPERTENSION The current medical regimen is effective;  continue present plan and medications.   HYPERLIPIDEMIA The current medical regimen is effective;  continue present plan and medications.   CORONARY ARTERY DISEASE Nonobstructive. Risk factor modification. Increase exercise, eat heart-healthy diet, continue ASA, statin, bp control.  De Quervain's tenosynovitis, right Thumb spica splint rx given to patient.  Thyromegaly Thyroid/neck soft tissue ultrasound ordered. Check  thyroid lab panel.  Health maintenance examination Reviewed age and gender appropriate health maintenance issues (prudent diet, regular exercise, health risks of tobacco and excessive alcohol, use of seatbelts, fire alarms in home, use of sunscreen).  Also reviewed age and gender appropriate health screening as well as vaccine recommendations. DRE normal today, PSA drawn.   An After Visit Summary was printed and given to the patient.  FOLLOW UP:  Return in about 3 months (around 07/30/2014) for routine chronic illness f/u--fasting.

## 2014-04-29 NOTE — Progress Notes (Signed)
Quick Note:  Please notify patient that his thyroid appeared normal on ultrasound. Tell him that the tissue that I was feeling on his neck must have been redundant skin and there was no abnormality found. Reassured patient that everything is fine. ______

## 2014-04-29 NOTE — Progress Notes (Signed)
Pre visit review using our clinic review tool, if applicable. No additional management support is needed unless otherwise documented below in the visit note. 

## 2014-04-30 LAB — T3: T3, Total: 145.5 ng/dL (ref 80.0–204.0)

## 2014-05-01 ENCOUNTER — Encounter: Payer: Self-pay | Admitting: Family Medicine

## 2014-05-01 DIAGNOSIS — E010 Iodine-deficiency related diffuse (endemic) goiter: Secondary | ICD-10-CM | POA: Insufficient documentation

## 2014-05-01 DIAGNOSIS — M654 Radial styloid tenosynovitis [de Quervain]: Secondary | ICD-10-CM | POA: Insufficient documentation

## 2014-05-01 NOTE — Assessment & Plan Note (Signed)
The current medical regimen is effective;  continue present plan and medications.  

## 2014-05-01 NOTE — Assessment & Plan Note (Signed)
Thyroid/neck soft tissue ultrasound ordered. Check thyroid lab panel.

## 2014-05-01 NOTE — Assessment & Plan Note (Signed)
Nonobstructive. Risk factor modification. Increase exercise, eat heart-healthy diet, continue ASA, statin, bp control.

## 2014-05-01 NOTE — Assessment & Plan Note (Signed)
Thumb spica splint rx given to patient.

## 2014-05-01 NOTE — Assessment & Plan Note (Signed)
>>  ASSESSMENT AND PLAN FOR CAD S/P PCI WRITTEN ON 05/01/2014 10:15 PM BY MCGOWEN, PHILIP H, MD  Nonobstructive. Risk factor modification. Increase exercise, eat heart-healthy diet, continue ASA, statin, bp control.

## 2014-05-01 NOTE — Assessment & Plan Note (Signed)
Reviewed age and gender appropriate health maintenance issues (prudent diet, regular exercise, health risks of tobacco and excessive alcohol, use of seatbelts, fire alarms in home, use of sunscreen).  Also reviewed age and gender appropriate health screening as well as vaccine recommendations. DRE normal today, PSA drawn.

## 2014-05-02 LAB — THYROID PEROXIDASE ANTIBODY: Thyroperoxidase Ab SerPl-aCnc: <10 [IU]/mL

## 2014-06-06 ENCOUNTER — Other Ambulatory Visit: Payer: Self-pay

## 2014-06-06 MED ORDER — ATORVASTATIN CALCIUM 20 MG PO TABS: 20.0000 mg | ORAL_TABLET | Freq: Every day | ORAL | Status: DC

## 2014-06-06 MED ORDER — LISINOPRIL-HYDROCHLOROTHIAZIDE 20-25 MG PO TABS: 1.0000 | ORAL_TABLET | Freq: Every day | ORAL | Status: DC

## 2014-07-29 ENCOUNTER — Ambulatory Visit: Payer: Medicare Other | Admitting: Family Medicine

## 2014-08-02 ENCOUNTER — Encounter: Payer: Self-pay | Admitting: Family Medicine

## 2014-08-02 ENCOUNTER — Ambulatory Visit (INDEPENDENT_AMBULATORY_CARE_PROVIDER_SITE_OTHER): Payer: Medicare Other | Admitting: Family Medicine

## 2014-08-02 VITALS — BP 127/79 | HR 79 | Temp 98.2°F | Resp 18 | Ht 71.0 in | Wt 264.0 lb

## 2014-08-02 DIAGNOSIS — M75102 Unspecified rotator cuff tear or rupture of left shoulder, not specified as traumatic: Secondary | ICD-10-CM

## 2014-08-02 DIAGNOSIS — E785 Hyperlipidemia, unspecified: Secondary | ICD-10-CM

## 2014-08-02 DIAGNOSIS — M7542 Impingement syndrome of left shoulder: Secondary | ICD-10-CM | POA: Insufficient documentation

## 2014-08-02 DIAGNOSIS — M654 Radial styloid tenosynovitis [de Quervain]: Secondary | ICD-10-CM

## 2014-08-02 DIAGNOSIS — Z23 Encounter for immunization: Secondary | ICD-10-CM

## 2014-08-02 DIAGNOSIS — I10 Essential (primary) hypertension: Secondary | ICD-10-CM

## 2014-08-02 MED ORDER — METHYLPREDNISOLONE ACETATE 40 MG/ML IJ SUSP: 40.0000 mg | Freq: Once | INTRAMUSCULAR | Status: DC

## 2014-08-02 NOTE — Progress Notes (Signed)
OFFICE NOTE  08/02/2014  CC:  Chief Complaint  Patient presents with  . Follow-up    not fasting   HPI: Patient is a 68 y.o. Caucasian male who is here for 3 mo f/u HTN, hyperlipidemia. Feeling good overall.  Compliant with meds.  Home bp monitoring is normal. Admits to frustration with diet, says he can do a diet like Atkins diet and get a lot of weight off but then he starts eating "normal" again and the weight comes back.    His DeQuervains tenosyn of right thumb is improved 60% since I last saw him, has been wearing a [employer] wrist/thumb brace that is getting worn out/uncomfortable.  Asks for splint here today.  Left shoulder with pain, onset about 2 mo ago, gradually worsening, hurts with reaching out/abducting--anterolateral shoulder region. No radiation of pain, no paresthesias, no neck pain.  No injury prior to onset of pain.  Pertinent PMH:  Past medical, surgical, social, and family history reviewed and no changes are noted since last office visit.  MEDS:  Outpatient Prescriptions Prior to Visit  Medication Sig Dispense Refill  . aspirin 81 MG tablet Take 1 tablet (81 mg total) by mouth daily.      [name] atorvastatin (LIPITOR) 20 MG tablet Take 1 tablet (20 mg total) by mouth daily.  30 tablet  3  . lisinopril-hydrochlorothiazide (PRINZIDE,ZESTORETIC) 20-25 MG per tablet Take 1 tablet by mouth daily.  30 tablet  3  . mometasone-formoterol (DULERA) 100-5 MCG/ACT AERO Inhale 2 puffs into the lungs 2 (two) times daily.  1 Inhaler  0   No facility-administered medications prior to visit.    PE: Blood pressure 127/79, pulse 79, temperature 98.2 F (36.8 C), temperature source Temporal, resp. rate 18, height 5\' 11"  (1.803 m), weight 264 lb (119.75 kg), SpO2 97.00%. Gen: Alert, well appearing.  Patient is oriented to person, place, time, and situation. L thumb mildly TTP along extensor/abduc tendon over proximal thumb region.  He cannot do finkelstein's testing due to  pain. Left shoulder: TTP along lateral aspect of acromion.  No AC joint TTP. He has limited aBduction in left shoulder due to pain starting at about 20-30 deg of aBduction. ER/IR are fine.    IMPRESSION AND PLAN:  1) HTN;The current medical regimen is effective;  continue present plan and medications.  2) Hyperlipidemia: tolerating statin, compliant with med now. Not fasting today.  Will recheck FLP just prior to next f/u in 6 mo.  3) DeQuervain's tenosynovitis: continue splint: thumb spica splint dispensed/fitted to pt today in office. If not back to 90-100 % in right thumb in 3 mo then we'll do steroid injection.  4) Left RC tendonitis/impingement syndrome: he is not an NSAID candidate due to allergy. Decided on steroid injection today.  5) Preventative health care: flu and prevnar vaccines given today.   Procedure: Therapeutic left shoulder injection.  The patient's clinical condition is marked by substantial pain and/or significant functional disability.  Other conservative therapy has not provided relief, is contraindicated, or not appropriate.  There is a reasonable likelihood that injection will significantly improve the patient's pain and/or functional disability. Cleaned skin with alcohol swab, used posterolateral approach, Injected 1 ml of 40mg /ml depo medrol + 2 ml of 2% lidocaine w/out epi into subacromial space without resistance.  No immediate complications.  Patient tolerated procedure well.  Post-injection care discussed, including 20 min of icing 1-2 times in the next 4-8 hours, frequent non weight-bearing ROM exercises over the next few days,  and general pain medication management.  An After Visit Summary was printed and given to the patient.  FOLLOW UP: 6 mo, fasting labs the week prior.

## 2014-08-02 NOTE — Progress Notes (Signed)
Pre visit review using our clinic review tool, if applicable. No additional management support is needed unless otherwise documented below in the visit note. 

## 2014-08-03 ENCOUNTER — Telehealth: Payer: Self-pay | Admitting: Family Medicine

## 2014-08-03 NOTE — Telephone Encounter (Signed)
[facility] MAILED  °

## 2014-09-29 ENCOUNTER — Encounter (HOSPITAL_COMMUNITY): Payer: Self-pay | Admitting: Cardiovascular Disease

## 2014-12-14 ENCOUNTER — Other Ambulatory Visit: Payer: Self-pay | Admitting: Family Medicine

## 2014-12-14 MED ORDER — LISINOPRIL-HYDROCHLOROTHIAZIDE 20-25 MG PO TABS: 1.0000 | ORAL_TABLET | Freq: Every day | ORAL | Status: DC

## 2014-12-16 ENCOUNTER — Ambulatory Visit (INDEPENDENT_AMBULATORY_CARE_PROVIDER_SITE_OTHER): Payer: Self-pay | Admitting: Family Medicine

## 2014-12-16 ENCOUNTER — Encounter: Payer: Self-pay | Admitting: Family Medicine

## 2014-12-16 VITALS — BP 143/82 | HR 70 | Temp 98.0°F | Resp 18 | Ht 71.0 in | Wt 271.0 lb

## 2014-12-16 DIAGNOSIS — K2100 Gastro-esophageal reflux disease with esophagitis, without bleeding: Secondary | ICD-10-CM

## 2014-12-16 DIAGNOSIS — R1310 Dysphagia, unspecified: Secondary | ICD-10-CM

## 2014-12-16 DIAGNOSIS — I10 Essential (primary) hypertension: Secondary | ICD-10-CM

## 2014-12-16 DIAGNOSIS — G4733 Obstructive sleep apnea (adult) (pediatric): Secondary | ICD-10-CM

## 2014-12-16 DIAGNOSIS — E785 Hyperlipidemia, unspecified: Secondary | ICD-10-CM

## 2014-12-16 LAB — COMPREHENSIVE METABOLIC PANEL
ALBUMIN: 4.5 g/dL (ref 3.5–5.2)
ALK PHOS: 93 U/L (ref 39–117)
ALT: 17 U/L (ref 0–53)
AST: 16 U/L (ref 0–37)
BUN: 20 mg/dL (ref 6–23)
CO2: 32 mEq/L (ref 19–32)
Calcium: 9.8 mg/dL (ref 8.4–10.5)
Chloride: 99 mEq/L (ref 96–112)
Creatinine, Ser: 0.98 mg/dL (ref 0.40–1.50)
GFR: 81.27 mL/min (ref >60.00)
Glucose, Bld: 104 mg/dL — ABNORMAL HIGH (ref 70–99)
Potassium: 4.8 mEq/L (ref 3.5–5.1)
SODIUM: 137 meq/L (ref 135–145)
Total Bilirubin: 0.5 mg/dL (ref 0.2–1.2)
Total Protein: 7.4 g/dL (ref 6.0–8.3)

## 2014-12-16 LAB — LDL CHOLESTEROL, DIRECT: LDL DIRECT: 117 mg/dL

## 2014-12-16 LAB — LIPID PANEL
Cholesterol: 172 mg/dL (ref 0–200)
HDL: 39.4 mg/dL (ref >39.00)
NonHDL: 132.6
Total CHOL/HDL Ratio: 4
Triglycerides: 258 mg/dL — ABNORMAL HIGH (ref 0.0–149.0)
VLDL: 51.6 mg/dL — ABNORMAL HIGH (ref 0.0–40.0)

## 2014-12-16 MED ORDER — OMEPRAZOLE 40 MG PO CPDR: 40.0000 mg | DELAYED_RELEASE_CAPSULE | Freq: Every day | ORAL | Status: DC

## 2014-12-16 NOTE — Progress Notes (Signed)
Pre visit review using our clinic review tool, if applicable. No additional management support is needed unless otherwise documented below in the visit note. 

## 2014-12-16 NOTE — Progress Notes (Signed)
OFFICE NOTE  12/16/2014  CC:  Chief Complaint  Patient presents with  . difficulty swallowing  . Snoring   HPI: Patient is a 69 y.o. Caucasian male who is here for difficulty swallowing. Reports 2-3 hx of intermittent dysphagia, seems to be getting worse lately: occurs at least weekly in which some food gets temporarily stuck and he has to forcefully drink water to get it to wash down.  Occ has to even go regurgitate food b/c water won't wash it down.  No aspiration or odynophagia. Describes long hx of GERD but no meds for this.  Has never had upper endoscopy.  Separate issue today: long hx of snoring, wife has mentioned he stops breathing at times during sleep.  Has never consulted an MD about this. Also describes excessive somnolence during daytime for "long time".  Also reports he gets up 3-4 times a night to urinate.  Of note, says shoulder injection I did last visit helped for about 2-3 weeks only.    Pertinent PMH:  Past medical, surgical, social, and family history reviewed and no changes are noted since last office visit.  MEDS:  Outpatient Prescriptions Prior to Visit  Medication Sig Dispense Refill  . aspirin 81 MG tablet Take 1 tablet (81 mg total) by mouth daily.    [name] atorvastatin (LIPITOR) 20 MG tablet Take 1 tablet (20 mg total) by mouth daily. 30 tablet 3  . lisinopril-hydrochlorothiazide (PRINZIDE,ZESTORETIC) 20-25 MG per tablet Take 1 tablet by mouth daily. 30 tablet 3  . mometasone-formoterol (DULERA) 100-5 MCG/ACT AERO Inhale 2 puffs into the lungs 2 (two) times daily. (Patient not taking: Reported on 12/16/2014) 1 Inhaler 0   No facility-administered medications prior to visit.    PE: Blood pressure 143/82, pulse 70, temperature 98 F (36.7 C), temperature source Temporal, resp. rate 18, height 5\' 11"  (1.803 m), weight 271 lb (122.925 kg), SpO2 95 %. Gen: Alert, well appearing.  Patient is oriented to person, place, time, and  situation. Obese-appearing. SJG:GEZM: no injection, icteris, swelling, or exudate.  EOMI, PERRLA. Mouth: lips without lesion/swelling.  Oral mucosa pink and moist. Oropharynx without erythema, exudate, or swelling.  NECK: unchanged asymmetry of right thyroid gland region, without palpable nodule or gland.  Nontender. CV: RRR, no m/r/g.   LUNGS: CTA bilat, nonlabored resps, good aeration in all lung fields. EXT: no clubbing, cyanosis, or edema.    IMPRESSION AND PLAN:  1) GERD, long hx--not treated.  Now with increasing dysphagia. Start omeprazole 40mg  qd, GERD diet handout given, and will ask GI to see him for consideration of upper endoscopy.  2) Snoring/apnea in sleep: suspicion of OSA. Pulm referral today.  3) Hyperlipidemia and HTN: he is fasting today and we'll get CMET and FLP. Continue current meds.  An After Visit Summary was printed and given to the patient.  FOLLOW UP: 25mo to f/u current issues and re-eval shoulder pain.

## 2014-12-19 ENCOUNTER — Other Ambulatory Visit: Payer: Self-pay | Admitting: *Deleted

## 2014-12-19 MED ORDER — ATORVASTATIN CALCIUM 40 MG PO TABS: 40.0000 mg | ORAL_TABLET | Freq: Every day | ORAL | Status: DC

## 2014-12-21 ENCOUNTER — Encounter: Payer: Self-pay | Admitting: Internal Medicine

## 2015-01-02 ENCOUNTER — Encounter: Payer: Self-pay | Admitting: Family Medicine

## 2015-01-02 ENCOUNTER — Telehealth: Payer: Self-pay | Admitting: Family Medicine

## 2015-01-02 ENCOUNTER — Ambulatory Visit (INDEPENDENT_AMBULATORY_CARE_PROVIDER_SITE_OTHER): Payer: Medicare Other | Admitting: Family Medicine

## 2015-01-02 VITALS — BP 154/85 | HR 87 | Temp 99.8°F | Resp 22 | Ht 71.0 in | Wt 271.0 lb

## 2015-01-02 DIAGNOSIS — J069 Acute upper respiratory infection, unspecified: Secondary | ICD-10-CM

## 2015-01-02 DIAGNOSIS — J441 Chronic obstructive pulmonary disease with (acute) exacerbation: Secondary | ICD-10-CM | POA: Diagnosis not present

## 2015-01-02 MED ORDER — PREDNISONE 20 MG PO TABS: ORAL_TABLET | ORAL | Status: DC

## 2015-01-02 MED ORDER — IPRATROPIUM BROMIDE 0.02 % IN SOLN
0.5000 mg | Freq: Once | RESPIRATORY_TRACT | Status: AC
  Administered 2015-01-02: 0.5 mg via RESPIRATORY_TRACT

## 2015-01-02 MED ORDER — ALBUTEROL SULFATE HFA 108 (90 BASE) MCG/ACT IN AERS: 2.0000 | INHALATION_SPRAY | Freq: Four times a day (QID) | RESPIRATORY_TRACT | Status: DC | PRN

## 2015-01-02 MED ORDER — ALBUTEROL SULFATE (2.5 MG/3ML) 0.083% IN NEBU: 2.5000 mg | INHALATION_SOLUTION | RESPIRATORY_TRACT | Status: DC | PRN

## 2015-01-02 MED ORDER — ALBUTEROL SULFATE (2.5 MG/3ML) 0.083% IN NEBU
2.5000 mg | INHALATION_SOLUTION | Freq: Once | RESPIRATORY_TRACT | Status: AC
  Administered 2015-01-02: 2.5 mg via RESPIRATORY_TRACT

## 2015-01-02 MED ORDER — MOMETASONE FURO-FORMOTEROL FUM 100-5 MCG/ACT IN AERO: 2.0000 | INHALATION_SPRAY | Freq: Two times a day (BID) | RESPIRATORY_TRACT | Status: DC

## 2015-01-02 NOTE — Progress Notes (Signed)
OFFICE NOTE  01/02/2015  CC:  Chief Complaint  Patient presents with  . Fever    101 temp last night  . Shortness of Breath  . Cough     HPI: Patient is a 69 y.o. Caucasian male who is here for resp illness. He is a former smoker, quit 1999.  Has not been taking his symbicort due to "feeling fine lately" + financial reasons. Onset 2-3 days ago of bad coughing + SOB with activity/exertion.  At rest he felt ok.  T 101 last night. No HA, ST, or body aches.  Cough is prod of greenish/gooey phlegm--different than his usual. Recently around son in law who was diagnosed +flu a few days ago.  He did get flu vaccine this season. No OTC cough med used.    Pertinent PMH:  Past medical, surgical, social, and family history reviewed and no changes are noted since last office visit.  MEDS:  Outpatient Prescriptions Prior to Visit  Medication Sig Dispense Refill  . aspirin 81 MG tablet Take 1 tablet (81 mg total) by mouth daily.    [name] atorvastatin (LIPITOR) 40 MG tablet Take 1 tablet (40 mg total) by mouth daily. 30 tablet 6  . lisinopril-hydrochlorothiazide (PRINZIDE,ZESTORETIC) 20-25 MG per tablet Take 1 tablet by mouth daily. 30 tablet 3  . omeprazole (PRILOSEC) 40 MG capsule Take 1 capsule (40 mg total) by mouth daily. 30 capsule 3  . mometasone-formoterol (DULERA) 100-5 MCG/ACT AERO Inhale 2 puffs into the lungs 2 (two) times daily. (Patient not taking: Reported on 12/16/2014) 1 Inhaler 0   No facility-administered medications prior to visit.    PE: Blood pressure 154/85, pulse 87, temperature 99.8 F (37.7 C), temperature source Temporal, resp. rate 22, height 5\' 11"  (1.803 m), weight 271 lb (122.925 kg), SpO2 94 %. VS: noted--normal. Gen: alert, NAD, NONTOXIC APPEARING. HEENT: eyes without injection, drainage, or swelling.  Ears: EACs clear, TMs with normal light reflex and landmarks.  Nose: Clear rhinorrhea, with some dried, crusty exudate adherent to mildly injected mucosa.  No  purulent d/c.  No paranasal sinus TTP.  No facial swelling.  Throat and mouth without focal lesion.  No pharyngial swelling, erythema, or exudate.   Neck: supple, no LAD.   LUNGS: Pre-neb: mild diffuse exp coarse wheeze with mild prolongation of exp phase and mild impairment of airflow on exhalation.  Post-neb: CTA bilat, nonlabored resps.  Exp phase not prolonged. CV: RRR, no m/r/g. EXT: no c/c/e SKIN: no rash  IMPRESSION AND PLAN:  COPD exacerbation, viral URI. I don't think his recent exposure to flu has anything to do with this illness. Improved signif with alb/atr neb in office today. Will tx with prednisone 40 x 5, 20 x 5 + albuterol HFA for q4h rescue. Get him back on dulera 2 p bid and don't stop taking until I see him again.  An After Visit Summary was printed and given to the patient.  FOLLOW UP: 3 mo f/u COPD

## 2015-01-02 NOTE — Progress Notes (Signed)
Pre visit review using our clinic review tool, if applicable. No additional management support is needed unless otherwise documented below in the visit note. 

## 2015-01-02 NOTE — Telephone Encounter (Signed)
Pt's daughter called wanting to know if pt is being dx with COPD.  Apparently it was on his AVS and they have never actually heard him being diagnosed with COPD.  Please advise.

## 2015-01-03 NOTE — Telephone Encounter (Signed)
Please advise 

## 2015-01-03 NOTE — Telephone Encounter (Signed)
Patient's wife called back & left a VM on front desk line asking about COPD dx. She wants to know if he should be followed by Pulmonary doctor if he has COPD. Please call her back.

## 2015-01-05 MED ORDER — DOXYCYCLINE HYCLATE 100 MG PO TABS: 100.0000 mg | ORAL_TABLET | Freq: Two times a day (BID) | ORAL | Status: DC

## 2015-01-05 NOTE — Telephone Encounter (Signed)
I called pt back today (daughter not on dpr). He is improved but still with lots of cough/chest congestion and he requests trial of adding an antibiotic. I agreed to rx doxycycline 100 mg bid x 10d. He understands my reasoning regarding recent dx of COPD that I put in his chart--clinical dx based on hx of smoking and his problems with recurrent bronchitis/wheezing illnesses over the last years.

## 2015-01-06 ENCOUNTER — Telehealth: Payer: Self-pay | Admitting: Family Medicine

## 2015-01-06 DIAGNOSIS — J411 Mucopurulent chronic bronchitis: Secondary | ICD-10-CM

## 2015-01-06 DIAGNOSIS — F17201 Nicotine dependence, unspecified, in remission: Secondary | ICD-10-CM

## 2015-01-06 NOTE — Telephone Encounter (Signed)
Patient requests referral to [facility] Pulmonary Dr. Gwenette Greet.

## 2015-01-06 NOTE — Telephone Encounter (Signed)
Please advise 

## 2015-01-06 NOTE — Telephone Encounter (Signed)
Referral to Dr. Gwenette Greet ordered as per pt request.

## 2015-01-10 ENCOUNTER — Other Ambulatory Visit: Payer: Self-pay | Admitting: Family Medicine

## 2015-01-10 DIAGNOSIS — J411 Mucopurulent chronic bronchitis: Secondary | ICD-10-CM

## 2015-01-10 DIAGNOSIS — Z720 Tobacco use: Secondary | ICD-10-CM

## 2015-01-11 NOTE — Telephone Encounter (Signed)
Patient appt 02/07/15

## 2015-01-20 DIAGNOSIS — R7303 Prediabetes: Secondary | ICD-10-CM

## 2015-01-20 HISTORY — DX: Prediabetes: R73.03

## 2015-01-24 ENCOUNTER — Other Ambulatory Visit (INDEPENDENT_AMBULATORY_CARE_PROVIDER_SITE_OTHER): Payer: Medicare Other

## 2015-01-24 DIAGNOSIS — E785 Hyperlipidemia, unspecified: Secondary | ICD-10-CM | POA: Diagnosis not present

## 2015-01-24 DIAGNOSIS — R7301 Impaired fasting glucose: Secondary | ICD-10-CM

## 2015-01-24 LAB — LIPID PANEL
CHOL/HDL RATIO: 4
Cholesterol: 184 mg/dL (ref 0–200)
HDL: 43.6 mg/dL (ref >39.00)
LDL Cholesterol: 101 mg/dL — ABNORMAL HIGH (ref 0–99)
NonHDL: 140.4
TRIGLYCERIDES: 197 mg/dL — AB (ref 0.0–149.0)
VLDL: 39.4 mg/dL (ref 0.0–40.0)

## 2015-01-24 LAB — HEMOGLOBIN A1C: Hgb A1c MFr Bld: 6.1 % (ref 4.6–6.5)

## 2015-01-26 ENCOUNTER — Encounter: Payer: Self-pay | Admitting: Family Medicine

## 2015-01-31 ENCOUNTER — Encounter: Payer: Self-pay | Admitting: Family Medicine

## 2015-01-31 ENCOUNTER — Ambulatory Visit (INDEPENDENT_AMBULATORY_CARE_PROVIDER_SITE_OTHER): Payer: Medicare Other | Admitting: Family Medicine

## 2015-01-31 VITALS — BP 110/60 | HR 79 | Temp 98.7°F | Ht 71.0 in | Wt 268.0 lb

## 2015-01-31 DIAGNOSIS — J449 Chronic obstructive pulmonary disease, unspecified: Secondary | ICD-10-CM

## 2015-01-31 DIAGNOSIS — M25512 Pain in left shoulder: Secondary | ICD-10-CM | POA: Diagnosis not present

## 2015-01-31 DIAGNOSIS — M12512 Traumatic arthropathy, left shoulder: Secondary | ICD-10-CM | POA: Diagnosis not present

## 2015-01-31 DIAGNOSIS — E785 Hyperlipidemia, unspecified: Secondary | ICD-10-CM | POA: Diagnosis not present

## 2015-01-31 DIAGNOSIS — G8929 Other chronic pain: Secondary | ICD-10-CM

## 2015-01-31 DIAGNOSIS — M12812 Other specific arthropathies, not elsewhere classified, left shoulder: Secondary | ICD-10-CM

## 2015-01-31 DIAGNOSIS — R7303 Prediabetes: Secondary | ICD-10-CM

## 2015-01-31 DIAGNOSIS — R7309 Other abnormal glucose: Secondary | ICD-10-CM

## 2015-01-31 MED ORDER — ALBUTEROL SULFATE (2.5 MG/3ML) 0.083% IN NEBU: 2.5000 mg | INHALATION_SOLUTION | Freq: Four times a day (QID) | RESPIRATORY_TRACT | Status: DC | PRN

## 2015-01-31 MED ORDER — OMEPRAZOLE 40 MG PO CPDR: 40.0000 mg | DELAYED_RELEASE_CAPSULE | Freq: Every day | ORAL | Status: DC

## 2015-01-31 NOTE — Progress Notes (Signed)
Pre visit review using our clinic review tool, if applicable. No additional management support is needed unless otherwise documented below in the visit note. 

## 2015-01-31 NOTE — Progress Notes (Signed)
OFFICE VISIT  02/04/2015   CC:  Chief Complaint  Patient presents with  . Follow-up   HPI:    Patient is a 69 y.o. Caucasian male who presents accompanied by his wife for f/u COPD as well as discuss L shoulder pain.   Pt with history of recurrent coughing/wheezing illnesses, long hx of tobacco abuse (although he quit smoking >15 yrs ago).  Wife and patient with questions today regarding my recent diagnoses of COPD in this pt.  Last visit I rx'd symbicort and instructed him to take it 2 p bid but he has only been using it prn and complains it doesn't help much. Has albuterol but uses it rarely since getting over his last COPD flare.  Says breathing feels "pretty good" lately, with minimal cough and SOB with exertion.  No fevers and no purulent sputum.  We discussed recent lab results which showed prediabetic-range A1c and mildly elevated trigs.  Pt has chronic left shoulder pain; no hx of shoulder trauma.  Comes in spurts where it hurts worse for several weeks, a condition he finds himself in currently: reaching out or back with left arm makes pain worse, as does sleeping on left shoulder.  At rest the shoulder does not hurt. He reports that a steroid injection in L shoulder in the past helped for only about 3 weeks.  However, he notes that when he was on steroids recently for COPD his shoulder pain improved.  Denies any feeling of shoulder "catching" or "clicking".    Past Medical History  Diagnosis Date  . Hyperlipidemia   . Hypertension   . GERD (gastroesophageal reflux disease)   . CAD (coronary artery disease)     per pt report cth @ 2001 showed one vessel dz (approx 40% occl).  In records, stress testing 03/2009 NEG  for ischemia, +hypertensive bp response.  CP admission 04/2014: cath was fine, EF normal  . Chronic left shoulder pain   . Spinal cord trauma     c-spine  . Hiatal hernia   . Collagenous colitis 2002  . Cataract     bilateral, lens inplant in left eye  . Chronic neck  pain   . Hepatic steatosis 04/2014    Noted on CT abd/pelv  . Former smoker quit 1999    70 pack-yr hx  . COPD (chronic obstructive pulmonary disease)   . Prediabetes 01/2015    A1c 6.1%    Past Surgical History  Procedure Laterality Date  . Tonsillectomy    . Carpal tunnel release    . Cataract extraction w/ intraocular lens implant      left  . Colonoscopy w/ polypectomy  2011    mild diverticulosis and 2 hyperplastic polyps (rpt 10 yrs)  . Cataract extraction w/phaco Right 08/31/2013    Procedure: CATARACT EXTRACTION PHACO AND INTRAOCULAR LENS PLACEMENT (IOC);  Surgeon: Elta Guadeloupe [name], MD;  Location: [facility];  Service: Ophthalmology;  Laterality: Right;  CDE:7.85  . Yag laser application Left 18/56/3149    Procedure: YAG LASER APPLICATION;  Surgeon: Elta Guadeloupe [name], MD;  Location: [facility];  Service: Ophthalmology;  Laterality: Left;  . Cardiac catheterization  04/2014    nonobstructive in LAD and circumflex--med mgmt  . Left heart catheterization with coronary angiogram N/A 04/27/2014    Procedure: LEFT HEART CATHETERIZATION WITH CORONARY ANGIOGRAM;  Surgeon: Blane Ohara, MD;  Location: [facility] CATH LAB;  Service: Cardiovascular;  Laterality: N/A;    Outpatient Prescriptions Prior to Visit  Medication Sig  Dispense Refill  . albuterol (VENTOLIN HFA) 108 (90 BASE) MCG/ACT inhaler Inhale 2 puffs into the lungs every 6 (six) hours as needed for wheezing or shortness of breath. 1 Inhaler 0  . aspirin 81 MG tablet Take 1 tablet (81 mg total) by mouth daily.    [name] atorvastatin (LIPITOR) 40 MG tablet Take 1 tablet (40 mg total) by mouth daily. 30 tablet 6  . lisinopril-hydrochlorothiazide (PRINZIDE,ZESTORETIC) 20-25 MG per tablet Take 1 tablet by mouth daily. 30 tablet 3  . mometasone-formoterol (DULERA) 100-5 MCG/ACT AERO Inhale 2 puffs into the lungs 2 (two) times daily. 1 Inhaler 6  . omeprazole (PRILOSEC) 40 MG capsule Take 1 capsule (40 mg total) by mouth daily. 30 capsule 3  .  doxycycline (VIBRA-TABS) 100 MG tablet Take 1 tablet (100 mg total) by mouth 2 (two) times daily. (Patient not taking: Reported on 01/31/2015) 20 tablet 0  . predniSONE (DELTASONE) 20 MG tablet 2 tabs po qd x 5d, then 1 tab po qd x 5d (Patient not taking: Reported on 01/31/2015) 15 tablet 0   No facility-administered medications prior to visit.    Allergies  Allergen Reactions  . Advil [Ibuprofen] Other (See Comments)    Lip swelling  . Penicillins Hives  . Sulfonamide Derivatives Hives    ROS As per HPI  PE: Blood pressure 110/60, pulse 79, temperature 98.7 F (37.1 C), temperature source Oral, height _0  (1.803 m), weight 268 lb (121.564 kg), SpO2 96 %. Gen: Alert, well appearing, obese-appearing WM in NAD.  Patient is oriented to person, place, time, and situation. CV: RRR, no m/r/g.   LUNGS: CTA bilat, nonlabored resps, good aeration in all lung fields. Left shoulder TTP over AC joint.  Also with TTP around edge of acromion esp posterolaterally, +limited ROM of L shoulder in aBduction due to pain.  +Impingement signs.  Pain with resisted IR/ER left shoulder.  + O'brien's testing and +scarf sign.  LABS:  Lab Results  Component Value Date   HGBA1C 6.1 01/24/2015     Chemistry      Component Value Date/Time   NA 137 12/16/2014 1028   K 4.8 12/16/2014 1028   CL 99 12/16/2014 1028   CO2 32 12/16/2014 1028   BUN 20 12/16/2014 1028   CREATININE 0.98 12/16/2014 1028      Component Value Date/Time   CALCIUM 9.8 12/16/2014 1028   ALKPHOS 93 12/16/2014 1028   AST 16 12/16/2014 1028   ALT 17 12/16/2014 1028   BILITOT 0.5 12/16/2014 1028     Lab Results  Component Value Date   CHOL 184 01/24/2015   HDL 43.60 01/24/2015   LDLCALC 101* 01/24/2015   LDLDIRECT 117.0 12/16/2014   TRIG 197.0* 01/24/2015   CHOLHDL 4 01/24/2015    IMPRESSION AND PLAN:  1) COPD suspected; pt and wife would like pulm eval/opinion and they have already scheduled an appt with a [place] pulm MD  and just need my referral ordered.  They'll discuss our past concern regarding OSA with the pulm MD as well.  Re-instructed pt regarding symbicort: take 2 puffs bid.  Albuterol rx'd for q6h rescue use.  2) Chronic L shoulder pain; suspect rotator cuff tendonopathy--possibly partial tear + suspicion of labrum tear. Refer to ortho ([facility]) today.  3) Metabolic syndrome: the most recent parameter he has met for this diagnosis is prediabetes status as per his A1c.  Refer to Nicky Pugh, FNP, for nutrition/TLC counseling.  Continue lipitor at current dosing.  An  After Visit Summary was printed and given to the patient.  FOLLOW UP: Return in about 4 months (around 06/02/2015) for routine chronic illness f/u; pls make appt with Nicky Pugh for nutrition/TLC for prediabetes/obes.

## 2015-02-01 ENCOUNTER — Telehealth: Payer: Self-pay | Admitting: Family Medicine

## 2015-02-01 NOTE — Telephone Encounter (Signed)
Relevant patient education mailed to patient.  

## 2015-02-02 ENCOUNTER — Institutional Professional Consult (permissible substitution): Payer: Medicare Other | Admitting: Nurse Practitioner

## 2015-02-07 ENCOUNTER — Ambulatory Visit (INDEPENDENT_AMBULATORY_CARE_PROVIDER_SITE_OTHER): Payer: Medicare Other | Admitting: Pulmonary Disease

## 2015-02-07 ENCOUNTER — Encounter: Payer: Self-pay | Admitting: Pulmonary Disease

## 2015-02-07 VITALS — BP 110/60 | HR 68 | Temp 98.6°F | Ht 72.0 in | Wt 271.2 lb

## 2015-02-07 DIAGNOSIS — J439 Emphysema, unspecified: Secondary | ICD-10-CM | POA: Insufficient documentation

## 2015-02-07 DIAGNOSIS — R0600 Dyspnea, unspecified: Secondary | ICD-10-CM

## 2015-02-07 DIAGNOSIS — J438 Other emphysema: Secondary | ICD-10-CM | POA: Diagnosis not present

## 2015-02-07 NOTE — Progress Notes (Signed)
   Subjective:    Patient ID: Allen [name], male    DOB: 1946-03-13, 69 y.o.   MRN: [id number]  HPI The patient is a 69 year old male who I've been asked to see for dyspnea on exertion. He tells me that he has had breathing issues for about 5 years, but has worsened over the last one year. He describes dyspnea at 6-8 blocks at a moderate pace on flat ground, but will get winded walking up one flight of stairs. He also get short of breath bringing groceries in from the car. He tells me that he gets recurrent chest infections each fall, but does not have 2-3 episodes a year. He has a history of smoking 2 packs per day for 35 years, but has not smoked since 1999. He had a chest x-ray and chest CT last summer that were unremarkable. He has never had pulmonary function studies. The patient describes hoarseness and a tickle in his throat that are persistent, and will have a morning cough with white foamy mucus. He denies any history of heart disease or recurrent lower extremity edema. Although his weight has been stable over the last one year, he has gained 40 pounds over the last 5 years. He has no history of asthma as a child or during in adulthood. He has been on an ACE inhibitor for 2-3 years. He has been taking dulera sporadically for 2 months, and has seen no change in his breathing. He does feel that albuterol by nebulization does help his breathing.   Review of Systems  Constitutional: Negative for fever and unexpected weight change.  HENT: Positive for postnasal drip. Negative for congestion, dental problem, ear pain, nosebleeds, rhinorrhea, sinus pressure, sneezing, sore throat and trouble swallowing.   Eyes: Negative for redness and itching.  Respiratory: Positive for shortness of breath. Negative for cough, chest tightness and wheezing.   Cardiovascular: Negative for palpitations and leg swelling.  Gastrointestinal: Negative for nausea and vomiting.  Genitourinary: Negative for dysuria.    Musculoskeletal: Negative for joint swelling.  Skin: Negative for rash.  Neurological: Negative for headaches.  Hematological: Does not bruise/bleed easily.  Psychiatric/Behavioral: Negative for dysphoric mood. The patient is not nervous/anxious.        Objective:   Physical Exam Constitutional:  Obese male, no acute distress  HENT:  Nares patent without discharge, +septal deviation to the left  Oropharynx without exudate, palate and uvula are elongated.  Eyes:  Perrla, eomi, no scleral icterus  Neck:  No JVD, no TMG  Cardiovascular:  Normal rate, regular rhythm, no rubs or gallops.  No murmurs        Intact distal pulses  Pulmonary :  Normal breath sounds, no stridor or respiratory distress   No rales, rhonchi, or wheezing  Abdominal:  Soft, nondistended, bowel sounds present.  No tenderness noted.   Musculoskeletal:  No lower extremity edema noted.  Lymph Nodes:  No cervical lymphadenopathy noted  Skin:  No cyanosis noted  Neurologic:  Alert, appropriate, moves all 4 extremities without obvious deficit.         Assessment & Plan:

## 2015-02-07 NOTE — Assessment & Plan Note (Signed)
The patient has very mild airflow obstruction on his spirometry today, and I suspect this is related to emphysema. Is no history to suggest underlying asthma. I would like for him to discontinue his dulera, and start on stiolto 2 puffs each morning to see if this helps. After 4 weeks, he can then decide if he wishes to stay on this for maintenance, or just use albuterol as needed and work aggressively on weight loss and conditioning. I suspect the majority of his breathing issues are related to his weight, but he also has upper airway symptoms that may be from his ACE inhibitor. I have asked him to get with his primary care physician to change this medication as a trial.

## 2015-02-07 NOTE — Patient Instructions (Addendum)
Please call you primary doctor to see about a substitute for your lisinopril. I will also send a note. Start on stiolto, 2 inhalations each am everyday no matter how you feel.  Please call me in 4 weeks with response. Work on weight loss and conditioning.  Will arrange for followup once I hear back with how things are going.

## 2015-02-08 NOTE — Telephone Encounter (Signed)
Patient went to Pulmonary appt & the doctor recommended he switch from the Lisinopril since it can restrict your breathing. Patient asked that medicine be generic so it won't cost too much. Thanks

## 2015-02-08 NOTE — Telephone Encounter (Signed)
Please advise 

## 2015-02-09 MED ORDER — VALSARTAN-HYDROCHLOROTHIAZIDE 160-25 MG PO TABS: 1.0000 | ORAL_TABLET | Freq: Every day | ORAL | Status: DC

## 2015-02-09 NOTE — Telephone Encounter (Signed)
Patient aware.

## 2015-02-09 NOTE — Telephone Encounter (Signed)
OK, stop lisinopril/hctz tab he is currently taking. I sent in rx for replacement med: generic for diovan HCT. Ask him to monitor home bp once a day x 1 week after starting new med and if bp ok then he can change monitoring to 1 time per week.-thx

## 2015-02-13 ENCOUNTER — Institutional Professional Consult (permissible substitution): Payer: Self-pay | Admitting: Pulmonary Disease

## 2015-02-15 ENCOUNTER — Encounter: Payer: Self-pay | Admitting: Family Medicine

## 2015-02-17 ENCOUNTER — Encounter: Payer: Self-pay | Admitting: Internal Medicine

## 2015-02-17 ENCOUNTER — Ambulatory Visit: Payer: Self-pay | Admitting: Internal Medicine

## 2015-02-17 ENCOUNTER — Ambulatory Visit (INDEPENDENT_AMBULATORY_CARE_PROVIDER_SITE_OTHER): Payer: Medicare Other | Admitting: Internal Medicine

## 2015-02-17 VITALS — BP 110/70 | HR 80 | Ht 71.0 in | Wt 274.0 lb

## 2015-02-17 DIAGNOSIS — K219 Gastro-esophageal reflux disease without esophagitis: Secondary | ICD-10-CM

## 2015-02-17 DIAGNOSIS — R1314 Dysphagia, pharyngoesophageal phase: Secondary | ICD-10-CM | POA: Diagnosis not present

## 2015-02-17 DIAGNOSIS — R1319 Other dysphagia: Secondary | ICD-10-CM

## 2015-02-17 DIAGNOSIS — R1310 Dysphagia, unspecified: Secondary | ICD-10-CM

## 2015-02-17 MED ORDER — OMEPRAZOLE 40 MG PO CPDR: 40.0000 mg | DELAYED_RELEASE_CAPSULE | Freq: Every day | ORAL | Status: DC

## 2015-02-17 NOTE — Patient Instructions (Signed)
We have sent the following medications to your pharmacy for you to pick up at your convenience: Omeprazole  You have been scheduled for an endoscopy. Please follow written instructions given to you at your visit today. If you use inhalers (even only as needed), please bring them with you on the day of your procedure.   I appreciate the opportunity to care for you. Silvano Rusk, M.D., [facility]

## 2015-02-17 NOTE — Progress Notes (Signed)
Referred by: Tammi Sou, MD [address] [place], [place] [address]  Subjective:    Patient ID: Allen [name], male    DOB: Mar 16, 1946, 69 y.o.   MRN: [id number] Cc: trouble swallowing HPI 69 yo married wm, retired Barrister's clerk, here with c/o mult-year hx of intermittent solid dysphagia. Rice, met, bread stick and senses in suprasternal notch and regurgitates at times. Otherwise waits for food to pass. Has been on omeprazole and improved but did not refill Rx. Weight has increased. No anemia/bleeding. GI ROS o/w negative. 2011 screening colonoscopy w/o colon neoplaisa.  Allergies  Allergen Reactions  . Advil [Ibuprofen] Other (See Comments)    Lip swelling  . Penicillins Hives  . Sulfonamide Derivatives Hives   Outpatient Prescriptions Prior to Visit  Medication Sig Dispense Refill  . albuterol (PROVENTIL) (2.5 MG/3ML) 0.083% nebulizer solution Take 3 mLs (2.5 mg total) by nebulization every 6 (six) hours as needed for wheezing or shortness of breath. 75 mL 1  . albuterol (VENTOLIN HFA) 108 (90 BASE) MCG/ACT inhaler Inhale 2 puffs into the lungs every 6 (six) hours as needed for wheezing or shortness of breath. 1 Inhaler 0  . aspirin 81 MG tablet Take 1 tablet (81 mg total) by mouth daily.    [name] atorvastatin (LIPITOR) 40 MG tablet Take 1 tablet (40 mg total) by mouth daily. 30 tablet 6  . mometasone-formoterol (DULERA) 100-5 MCG/ACT AERO Inhale 2 puffs into the lungs 2 (two) times daily. (Patient taking differently: Inhale 2 puffs into the lungs as needed. ) 1 Inhaler 6  . valsartan-hydrochlorothiazide (DIOVAN-HCT) 160-25 MG per tablet Take 1 tablet by mouth daily. 30 tablet 6  . omeprazole (PRILOSEC) 40 MG capsule Take 1 capsule (40 mg total) by mouth daily. 30 capsule 6   No facility-administered medications prior to visit.   Past Medical History  Diagnosis Date  . Hyperlipidemia   . Hypertension   . GERD (gastroesophageal reflux disease)   . CAD  (coronary artery disease)     per pt report cth @ 2001 showed one vessel dz (approx 40% occl).  In records, stress testing 03/2009 NEG  for ischemia, +hypertensive bp response.  CP admission 04/2014: cath was fine, EF normal  . Chronic left shoulder pain   . Spinal cord trauma     c-spine  . Hiatal hernia   . Collagenous colitis 2002  . Cataract     bilateral, lens inplant in left eye  . Chronic neck pain   . Hepatic steatosis 04/2014    Noted on CT abd/pelv  . Former smoker quit 1999    70 pack-yr hx  . COPD (chronic obstructive pulmonary disease)     Mild obstructive pattern on PFTs (Dr. Gwenette Greet, 01/2015)   . Prediabetes 01/2015    A1c 6.1%  . Obesity, Class II, BMI 35-39.9    Past Surgical History  Procedure Laterality Date  . Tonsillectomy    . Carpal tunnel release    . Cataract extraction w/ intraocular lens implant      left  . Colonoscopy w/ polypectomy  2011    mild diverticulosis and 2 hyperplastic polyps (rpt 10 yrs)  . Cataract extraction w/phaco Right 08/31/2013    Procedure: CATARACT EXTRACTION PHACO AND INTRAOCULAR LENS PLACEMENT (IOC);  Surgeon: Elta Guadeloupe [name], MD;  Location: [facility];  Service: Ophthalmology;  Laterality: Right;  CDE:7.85  . Yag laser application Left 27/03/2375    Procedure: YAG LASER APPLICATION;  Surgeon:  Mark [name], MD;  Location: [facility];  Service: Ophthalmology;  Laterality: Left;  . Cardiac catheterization  04/2014    nonobstructive in LAD and circumflex--med mgmt  . Left heart catheterization with coronary angiogram N/A 04/27/2014    Procedure: LEFT HEART CATHETERIZATION WITH CORONARY ANGIOGRAM;  Surgeon: Blane Ohara, MD;  Location: [facility] CATH LAB;  Service: Cardiovascular;  Laterality: N/A;   History   Social History  . Marital Status: Married    Spouse Name: N/A  . Number of Children: 6  . Years of Education: N/A   Occupational History  . retired    Social History Main Topics  . Smoking status: Former Smoker -- 2.00 packs/day  for 35 years    Types: Cigarettes    Quit date: 10/21/1997  . Smokeless tobacco: Never Used     Comment: 2-3 ppd  . Alcohol Use: No  . Drug Use: No  . Sexual Activity: Yes    Birth Control/ Protection: None   Other Topics Concern  . None   Social History Narrative   Married, 6 kids (2 adopted).   Retired Engineer, structural.   Former smoker.  No signif alc.     No drugs.         Family History  Problem Relation Age of Onset  . Cancer Mother     uterine  . Colon polyps Father   . Heart attack Mother     started in her 58s, died at age 29  . Heart attack Father     started in his 42s  . Diabetes Mother     Review of Systems This is positive for those things mentiones in the HPI, also positive for cough, decreased hearing, some dyspnea.. All other review of systems are negative.      Objective:   Physical Exam _0  110/70 mmHg  Pulse 80  Ht _1  (1.803 m)  Wt 274 lb (124.286 kg)  BMI 38.23 kg/m2@  General:  Well-developed, well-nourished and in no acute distress - obese Eyes:  anicteric. ENT:   Mouth and posterior pharynx free of lesions.  Neck:   supple w/o thyromegaly or mass.  Lungs: Clear to auscultation bilaterally. Heart:  S1S2, no rubs, murmurs, gallops. Abdomen:  soft, non-tender, no hepatosplenomegaly, hernia, or mass and BS+.  Lymph:  no cervical or supraclavicular adenopathy. Extremities:   no edema, cyanosis or clubbing Skin   no rash. Neuro:  A&O x 3.  Psych:  appropriate mood and  Affect.   Data Reviewed:  2015 and 2016 labs in EMR PCP notes 2016      Assessment & Plan:   Esophageal dysphagia  Gastroesophageal reflux disease, esophagitis presence not specified  1. Restart omeprazole 2. EGD possible esophageal dilatuon The risks and benefits as well as alternatives of endoscopic procedure(s) have been discussed and reviewed. All questions answered. The patient agrees to proceed.   I appreciate the opportunity to care for this  patient. [id number] H, MD

## 2015-02-19 ENCOUNTER — Encounter: Payer: Self-pay | Admitting: Internal Medicine

## 2015-02-24 ENCOUNTER — Other Ambulatory Visit: Payer: Self-pay | Admitting: Internal Medicine

## 2015-02-24 ENCOUNTER — Ambulatory Visit (AMBULATORY_SURGERY_CENTER): Payer: Medicare Other | Admitting: Internal Medicine

## 2015-02-24 ENCOUNTER — Encounter: Payer: Self-pay | Admitting: Internal Medicine

## 2015-02-24 VITALS — BP 133/76 | HR 72 | Temp 97.3°F | Resp 18 | Ht 71.0 in | Wt 274.0 lb

## 2015-02-24 DIAGNOSIS — K3189 Other diseases of stomach and duodenum: Secondary | ICD-10-CM

## 2015-02-24 DIAGNOSIS — K319 Disease of stomach and duodenum, unspecified: Secondary | ICD-10-CM | POA: Diagnosis not present

## 2015-02-24 DIAGNOSIS — R1314 Dysphagia, pharyngoesophageal phase: Secondary | ICD-10-CM

## 2015-02-24 DIAGNOSIS — R1319 Other dysphagia: Secondary | ICD-10-CM

## 2015-02-24 DIAGNOSIS — R1310 Dysphagia, unspecified: Secondary | ICD-10-CM

## 2015-02-24 HISTORY — PX: ESOPHAGOGASTRODUODENOSCOPY: SHX1529

## 2015-02-24 MED ORDER — SODIUM CHLORIDE 0.9 % IV SOLN
500.0000 mL | INTRAVENOUS | Status: DC
Start: 2015-02-24 — End: 2015-02-24

## 2015-02-24 NOTE — Op Note (Signed)
[place]  [address]. [place], [address]   ENDOSCOPY PROCEDURE REPORT  PATIENT: Allen, [name]  MR#: [id number] BIRTHDATE: April 10, 1946 , 48  yrs. old GENDER: male ENDOSCOPIST: Gatha Mayer, MD, [facility] PROCEDURE DATE:  02/24/2015 PROCEDURE:  EGD w/ biopsy and Maloney dilation of esophagus ASA CLASS:     Class III INDICATIONS:  dysphagia. MEDICATIONS: Propofol 200 mg IV, Monitored anesthesia care, and Lidocaine 200 mg IV TOPICAL ANESTHETIC: none  DESCRIPTION OF PROCEDURE: After the risks benefits and alternatives of the procedure were thoroughly explained, informed consent was obtained.  The [id number] endoscope was introduced through the mouth and advanced to the second portion of the duodenum , Without limitations.  The instrument was slowly withdrawn as the mucosa was fully examined.    1) Normal esophagus - given dysphagia - dilated w/ 51 Fr Maloney. easily passed, no heme and no tear seen on re-inspection. 2) 15-20 mm submucosal lesion in antrum, greater curve.  Firm. Tunnel biopsies taken. 3) Otherwise normal EGD.  Retroflexed views revealed no abnormalities.     The scope was then withdrawn from the patient and the procedure completed.  COMPLICATIONS: There were no immediate complications.  ENDOSCOPIC IMPRESSION: 1) Normal esophagus - given dysphagia - dilated w/ 79 Fr Maloney. easily passed, no heme and no tear seen on re-inspection. 2) 15-20 mm submucosal lesion in antrum, greater curve.  Firm. Tunnel biopsies taken. 3) Otherwise normal EGD  RECOMMENDATIONS: 1.  Clear liquids until 1230, then soft foods rest of day.  Resume prior diet tomorrow. 2.  Continue PPI 3.  Office will call with results   eSigned:  Gatha Mayer, MD, [facility] 02/24/2015 11:27 AM    CC: Crissie Sickles, MD and The Patient

## 2015-02-24 NOTE — Progress Notes (Signed)
Report to PACU, RN, vss, BBS= Clear.  

## 2015-02-24 NOTE — Patient Instructions (Addendum)
   I dilated the esophagus though it looked normal. I hope that helps you swallow better.  I found a small area of abnormality in the stomach - looks like its in the wall. I took biopsies. May be a tumor of the muscle layer.  Will call with results.  I appreciate the opportunity to care for you. Gatha Mayer, MD, FACG   YOU HAD AN ENDOSCOPIC PROCEDURE TODAY AT [facility]:   Refer to the procedure report that was given to you for any specific questions about what was found during the examination.  If the procedure report does not answer your questions, please call your gastroenterologist to clarify.  If you requested that your care partner not be given the details of your procedure findings, then the procedure report has been included in a sealed envelope for you to review at your convenience later.  YOU SHOULD EXPECT: Some feelings of bloating in the abdomen. Passage of more gas than usual.  Walking can help get rid of the air that was put into your GI tract during the procedure and reduce the bloating.   Please Note:  You might notice some irritation and congestion in your nose or some drainage.  This is from the oxygen used during your procedure.  There is no need for concern and it should clear up in a day or so.  SYMPTOMS TO REPORT IMMEDIATELY:    Following upper endoscopy (EGD)  Vomiting of blood or coffee ground material  New chest pain or pain under the shoulder blades  Painful or persistently difficult swallowing  New shortness of breath  Fever of 100F or higher  Black, tarry-looking stools  For urgent or emergent issues, a gastroenterologist can be reached at any hour by calling [phone].   DIET: Just clear liquids until 12:30pm, then you may have a soft diet for the rest of the day today.  Tomorrow, you may have a regular diet. Drink plenty of fluids but you should avoid alcoholic beverages for 24 hours.  ACTIVITY:  You should plan to take  it easy for the rest of today and you should NOT DRIVE or use heavy machinery until tomorrow (because of the sedation medicines used during the test).    FOLLOW UP: Our staff will call the number listed on your records the next business day following your procedure to check on you and address any questions or concerns that you may have regarding the information given to you following your procedure. If we do not reach you, we will leave a message.  However, if you are feeling well and you are not experiencing any problems, there is no need to return our call.  We will assume that you have returned to your regular daily activities without incident.  If any biopsies were taken you will be contacted by phone or by letter within the next 1-3 weeks.  Please call us at [phone] if you have not heard about the biopsies in 3 weeks.    SIGNATURES/CONFIDENTIALITY: You and/or your care partner have signed paperwork which will be entered into your electronic medical record.  These signatures attest to the fact that that the information above on your After Visit Summary has been reviewed and is understood.  Full responsibility of the confidentiality of this discharge information lies with you and/or your care-partner.  Read all of the handouts given to you by your recovery room nurse.

## 2015-02-24 NOTE — Progress Notes (Signed)
Called to room to assist during endoscopic procedure.  Patient ID and intended procedure confirmed with present staff. Received instructions for my participation in the procedure from the performing physician.  

## 2015-02-27 ENCOUNTER — Telehealth: Payer: Self-pay | Admitting: *Deleted

## 2015-02-27 NOTE — Telephone Encounter (Signed)
  Follow up Call-  Call back number 02/24/2015  Post procedure Call Back phone  # [phone]  Permission to leave phone message Yes     Patient questions:  Do you have a fever, pain , or abdominal swelling? No. Pain Score  0 *  Have you tolerated food without any problems? Yes.    Have you been able to return to your normal activities? Yes.    Do you have any questions about your discharge instructions: Diet   No. Medications  No. Follow up visit  No.  Do you have questions or concerns about your Care? No.  Actions: * If pain score is 4 or above: No action needed, pain <4.

## 2015-03-02 ENCOUNTER — Telehealth: Payer: Self-pay | Admitting: Internal Medicine

## 2015-03-02 NOTE — Telephone Encounter (Signed)
Pathology is still pending. He is notified we will call or mail a letter when the results are available

## 2015-03-03 ENCOUNTER — Encounter: Payer: Self-pay | Admitting: Internal Medicine

## 2015-03-03 ENCOUNTER — Other Ambulatory Visit: Payer: Self-pay

## 2015-03-03 ENCOUNTER — Telehealth: Payer: Self-pay

## 2015-03-03 DIAGNOSIS — K3189 Other diseases of stomach and duodenum: Secondary | ICD-10-CM

## 2015-03-03 HISTORY — DX: Other diseases of stomach and duodenum: K31.89

## 2015-03-03 NOTE — Progress Notes (Signed)
Quick Note:  Biopsies not deep enough so an EUS makes sense  I think I explained this to him at EGD  Dr. Ardis Hughs agrees - see below  Milus Banister, MD Gatha Mayer, MD Cc: Barron Alvine, CMA        I think EUS is reasonable for this.  If you let him know, we can call to set up.   Patty,  He needs upper EUS, radial +/- linear, next available EUS Thursday with MAC for gastric mass. Thanks       Previous Messages     NO RECALL OR [place]   ______

## 2015-03-03 NOTE — Telephone Encounter (Signed)
-----   Message from Milus Banister, MD sent at 03/03/2015  7:31 AM EDT ----- Regarding: RE: ? EUS I think EUS is reasonable for this.   If you let him know, we can call to set up.  Symon Norwood, He needs upper EUS, radial +/- linear, next available EUS Thursday with MAC for gastric mass.  Thanks   ----- Message -----    From: Gatha Mayer, MD    Sent: 03/03/2015   6:46 AM      To: Milus Banister, MD Subject: ? EUS                                          Please review his EGD re: if you think EUS reasonable seems like it is and could give definitive info for small submucosal lesion

## 2015-03-03 NOTE — Telephone Encounter (Signed)
EUS scheduled, pt instructed and medications reviewed.  Patient instructions mailed to home.  Patient to call with any questions or concerns.  

## 2015-03-06 ENCOUNTER — Institutional Professional Consult (permissible substitution): Payer: Self-pay | Admitting: Pulmonary Disease

## 2015-03-07 ENCOUNTER — Institutional Professional Consult (permissible substitution): Payer: Self-pay | Admitting: Pulmonary Disease

## 2015-03-09 ENCOUNTER — Encounter (HOSPITAL_COMMUNITY): Payer: Self-pay | Admitting: *Deleted

## 2015-03-09 ENCOUNTER — Encounter (HOSPITAL_COMMUNITY)
Admission: RE | Disposition: A | Discharge status: Home / Self Care | Payer: Self-pay | Source: Ambulatory Visit | Attending: Gastroenterology

## 2015-03-09 ENCOUNTER — Ambulatory Visit (HOSPITAL_COMMUNITY): Payer: Medicare Other | Admitting: Certified Registered Nurse Anesthetist

## 2015-03-09 ENCOUNTER — Ambulatory Visit (HOSPITAL_COMMUNITY)
Admission: RE | Admit: 2015-03-09 | Discharge: 2015-03-09 | Disposition: A | Discharge status: Home / Self Care | Payer: Medicare Other | Source: Ambulatory Visit | Attending: Gastroenterology | Admitting: Gastroenterology

## 2015-03-09 DIAGNOSIS — Z87891 Personal history of nicotine dependence: Secondary | ICD-10-CM | POA: Diagnosis not present

## 2015-03-09 DIAGNOSIS — K3189 Other diseases of stomach and duodenum: Secondary | ICD-10-CM

## 2015-03-09 DIAGNOSIS — K317 Polyp of stomach and duodenum: Secondary | ICD-10-CM | POA: Diagnosis present

## 2015-03-09 DIAGNOSIS — K319 Disease of stomach and duodenum, unspecified: Secondary | ICD-10-CM

## 2015-03-09 DIAGNOSIS — J449 Chronic obstructive pulmonary disease, unspecified: Secondary | ICD-10-CM | POA: Diagnosis not present

## 2015-03-09 DIAGNOSIS — Z7982 Long term (current) use of aspirin: Secondary | ICD-10-CM | POA: Insufficient documentation

## 2015-03-09 DIAGNOSIS — Z882 Allergy status to sulfonamides status: Secondary | ICD-10-CM | POA: Diagnosis not present

## 2015-03-09 DIAGNOSIS — R1319 Other dysphagia: Secondary | ICD-10-CM | POA: Insufficient documentation

## 2015-03-09 DIAGNOSIS — Z79899 Other long term (current) drug therapy: Secondary | ICD-10-CM | POA: Diagnosis not present

## 2015-03-09 DIAGNOSIS — Z880 Allergy status to penicillin: Secondary | ICD-10-CM | POA: Diagnosis not present

## 2015-03-09 DIAGNOSIS — Z6835 Body mass index (BMI) 35.0-35.9, adult: Secondary | ICD-10-CM | POA: Insufficient documentation

## 2015-03-09 DIAGNOSIS — K219 Gastro-esophageal reflux disease without esophagitis: Secondary | ICD-10-CM | POA: Insufficient documentation

## 2015-03-09 DIAGNOSIS — I2510 Atherosclerotic heart disease of native coronary artery without angina pectoris: Secondary | ICD-10-CM | POA: Diagnosis not present

## 2015-03-09 DIAGNOSIS — I10 Essential (primary) hypertension: Secondary | ICD-10-CM | POA: Insufficient documentation

## 2015-03-09 DIAGNOSIS — Z886 Allergy status to analgesic agent status: Secondary | ICD-10-CM | POA: Insufficient documentation

## 2015-03-09 DIAGNOSIS — E785 Hyperlipidemia, unspecified: Secondary | ICD-10-CM | POA: Diagnosis not present

## 2015-03-09 DIAGNOSIS — F329 Major depressive disorder, single episode, unspecified: Secondary | ICD-10-CM | POA: Insufficient documentation

## 2015-03-09 DIAGNOSIS — E669 Obesity, unspecified: Secondary | ICD-10-CM | POA: Insufficient documentation

## 2015-03-09 HISTORY — PX: EUS: SHX5427

## 2015-03-09 HISTORY — DX: Unspecified osteoarthritis, unspecified site: M19.90

## 2015-03-09 HISTORY — DX: Other diseases of stomach and duodenum: K31.89

## 2015-03-09 SURGERY — UPPER ENDOSCOPIC ULTRASOUND (EUS) RADIAL
Anesthesia: Monitor Anesthesia Care

## 2015-03-09 MED ORDER — PROPOFOL 10 MG/ML IV BOLUS
INTRAVENOUS | Status: DC | PRN
  Administered 2015-03-09: 20 mg via INTRAVENOUS

## 2015-03-09 MED ORDER — ONDANSETRON HCL 4 MG/2ML IJ SOLN
INTRAMUSCULAR | Status: AC
  Filled 2015-03-09: qty 2

## 2015-03-09 MED ORDER — PROPOFOL 10 MG/ML IV BOLUS
INTRAVENOUS | Status: AC
  Filled 2015-03-09: qty 20

## 2015-03-09 MED ORDER — LIDOCAINE HCL (CARDIAC) 20 MG/ML IV SOLN
INTRAVENOUS | Status: AC
  Filled 2015-03-09: qty 5

## 2015-03-09 MED ORDER — ONDANSETRON HCL 4 MG/2ML IJ SOLN
INTRAMUSCULAR | Status: DC | PRN
  Administered 2015-03-09: 4 mg via INTRAVENOUS

## 2015-03-09 MED ORDER — SODIUM CHLORIDE 0.9 % IV SOLN: INTRAVENOUS | Status: DC

## 2015-03-09 MED ORDER — PROPOFOL INFUSION 10 MG/ML OPTIME
INTRAVENOUS | Status: DC | PRN
  Administered 2015-03-09: 180 ug/kg/min via INTRAVENOUS

## 2015-03-09 MED ORDER — LACTATED RINGERS IV SOLN
INTRAVENOUS | Status: DC
  Administered 2015-03-09: 1000 mL via INTRAVENOUS

## 2015-03-09 MED ORDER — LIDOCAINE HCL (CARDIAC) 20 MG/ML IV SOLN
INTRAVENOUS | Status: DC | PRN
  Administered 2015-03-09: 100 mg via INTRAVENOUS

## 2015-03-09 NOTE — Transfer of Care (Signed)
Immediate Anesthesia Transfer of Care Note  Patient: Allen [name]  Procedure(s) Performed: Procedure(s) with comments: UPPER ENDOSCOPIC ULTRASOUND (EUS) RADIAL (N/A) - R/L  Patient Location: endoscopy  Anesthesia Type:MAC  Level of Consciousness:  sedated, patient cooperative and responds to stimulation  Airway & Oxygen Therapy:Patient Spontanous Breathing and Patient connected to face mask oxgen  Post-op Assessment:  Report given to endoscopy RN and Post -op Vital signs reviewed and stable  Post vital signs:  Reviewed and stable  Last Vitals:  Filed Vitals:   03/09/15 0956  BP: 169/91  Pulse: 75  Temp: 36.8 C  Resp: 10    Complications: No apparent anesthesia complications

## 2015-03-09 NOTE — Op Note (Signed)
[facility] [place], [address]   ENDOSCOPIC ULTRASOUND PROCEDURE REPORT  PATIENT: Allen [name], Allen [name]  MR#: [id number] BIRTHDATE: [birth year]  GENDER: male ENDOSCOPIST: Milus Banister, MD REFERRED BY:  Gatha Mayer, M.D, [facility] PROCEDURE DATE:  03/09/2015 PROCEDURE:   Upper EUS, EGD with snare polypectomy ASA CLASS:      Class II INDICATIONS:   1.  recently noted gastric submucosal lesion (Dr. Carlean Purl EGD), tunnel biopsies showed 'reactive gastropathy'. MEDICATIONS: Monitored anesthesia care  DESCRIPTION OF PROCEDURE:   After the risks benefits and alternatives of the procedure were  explained, informed consent was obtained. The patient was then placed in the left, lateral, decubitus postion and IV sedation was administered. Throughout the procedure, the patients blood pressure, pulse and oxygen saturations were monitored continuously.  Under direct visualization, the PENTAX EUS SCOPE  endoscope was introduced through the mouth  and advanced to the second portion of the duodenum .  Water was used as necessary to provide an acoustic interface.  Upon completion of the imaging, water was removed and the patient was sent to the recovery room in satisfactory condition.  Endoscopic findings: 1. Gastric submucosal lesion noted in distal stomach, approximately 1cm across. 2. UGI otherwise normal  EUS findings: 1. The lesion above correlated with a round, clearly demarcated, hypoechoic mass that was confined to the deep mucosal layers. The lesion measured 7.58mm across.  This did NOT appear to involve the muscularis propria layer and so was deemed appropriate for endoscopic removal attempt. 2.  Limited views of pancreas, spleen, liver were all normal.  Following EUS evaluation, the lesion was removed with snare/cautery and sent to pathology.  RECOMMENDATIONS: This is most consistent with a subcentimeter carcinoid.  Await final pathology. No aspirin  or NSAIDs for 2 weeks.  _______________________________ eSignedMilus Banister, MD 03/09/2015 10:53 AM

## 2015-03-09 NOTE — Anesthesia Preprocedure Evaluation (Signed)
Anesthesia Evaluation  Patient identified by MRN, date of birth, ID band Patient awake    Reviewed: Allergy & Precautions, H&P , NPO status , Patient's Chart, lab work & pertinent test results  Airway Mallampati: II  TM Distance: >3 FB Neck ROM: full    Dental  (+) Edentulous Upper, Edentulous Lower, Dental Advisory Given   Pulmonary COPD COPD inhaler, former smoker,  breath sounds clear to auscultation  Pulmonary exam normal       Cardiovascular hypertension, + CAD Normal cardiovascular examRhythm:regular Rate:Normal     Neuro/Psych Depression Numbness right side from broken neck negative neurological ROS  negative psych ROS   GI/Hepatic negative GI ROS, Neg liver ROS, GERD-  Medicated and Controlled,  Endo/Other  negative endocrine ROS  Renal/GU negative Renal ROS  negative genitourinary   Musculoskeletal   Abdominal   Peds  Hematology negative hematology ROS (+)   Anesthesia Other Findings   Reproductive/Obstetrics negative OB ROS                             Anesthesia Physical Anesthesia Plan  ASA: III  Anesthesia Plan: MAC   Post-op Pain Management:    Induction:   Airway Management Planned:   Additional Equipment:   Intra-op Plan:   Post-operative Plan:   Informed Consent: I have reviewed the patients History and Physical, chart, labs and discussed the procedure including the risks, benefits and alternatives for the proposed anesthesia with the patient or authorized representative who has indicated his/her understanding and acceptance.   Dental Advisory Given  Plan Discussed with: CRNA and Surgeon  Anesthesia Plan Comments:         Anesthesia Quick Evaluation

## 2015-03-09 NOTE — Interval H&P Note (Signed)
History and Physical Interval Note:  03/09/2015 10:04 AM  Allen [name]  has presented today for surgery, with the diagnosis of gastric mass  The various methods of treatment have been discussed with the patient and family. After consideration of risks, benefits and other options for treatment, the patient has consented to  Procedure(s) with comments: UPPER ENDOSCOPIC ULTRASOUND (EUS) RADIAL (N/A) - R/L as a surgical intervention .  The patient's history has been reviewed, patient examined, no change in status, stable for surgery.  I have reviewed the patient's chart and labs.  Questions were answered to the patient's satisfaction.     Milus Banister

## 2015-03-09 NOTE — Discharge Instructions (Signed)

## 2015-03-09 NOTE — H&P (View-Only) (Signed)
Referred by: Tammi Sou, MD [address] [place], [place] [address]  Subjective:    Patient ID: Allen [name], male    DOB: Mar 16, 1946, 69 y.o.   MRN: [id number] Cc: trouble swallowing HPI 69 yo married wm, retired Barrister's clerk, here with c/o mult-year hx of intermittent solid dysphagia. Rice, met, bread stick and senses in suprasternal notch and regurgitates at times. Otherwise waits for food to pass. Has been on omeprazole and improved but did not refill Rx. Weight has increased. No anemia/bleeding. GI ROS o/w negative. 2011 screening colonoscopy w/o colon neoplaisa.  Allergies  Allergen Reactions  . Advil [Ibuprofen] Other (See Comments)    Lip swelling  . Penicillins Hives  . Sulfonamide Derivatives Hives   Outpatient Prescriptions Prior to Visit  Medication Sig Dispense Refill  . albuterol (PROVENTIL) (2.5 MG/3ML) 0.083% nebulizer solution Take 3 mLs (2.5 mg total) by nebulization every 6 (six) hours as needed for wheezing or shortness of breath. 75 mL 1  . albuterol (VENTOLIN HFA) 108 (90 BASE) MCG/ACT inhaler Inhale 2 puffs into the lungs every 6 (six) hours as needed for wheezing or shortness of breath. 1 Inhaler 0  . aspirin 81 MG tablet Take 1 tablet (81 mg total) by mouth daily.    [name] atorvastatin (LIPITOR) 40 MG tablet Take 1 tablet (40 mg total) by mouth daily. 30 tablet 6  . mometasone-formoterol (DULERA) 100-5 MCG/ACT AERO Inhale 2 puffs into the lungs 2 (two) times daily. (Patient taking differently: Inhale 2 puffs into the lungs as needed. ) 1 Inhaler 6  . valsartan-hydrochlorothiazide (DIOVAN-HCT) 160-25 MG per tablet Take 1 tablet by mouth daily. 30 tablet 6  . omeprazole (PRILOSEC) 40 MG capsule Take 1 capsule (40 mg total) by mouth daily. 30 capsule 6   No facility-administered medications prior to visit.   Past Medical History  Diagnosis Date  . Hyperlipidemia   . Hypertension   . GERD (gastroesophageal reflux disease)   . CAD  (coronary artery disease)     per pt report cth @ 2001 showed one vessel dz (approx 40% occl).  In records, stress testing 03/2009 NEG  for ischemia, +hypertensive bp response.  CP admission 04/2014: cath was fine, EF normal  . Chronic left shoulder pain   . Spinal cord trauma     c-spine  . Hiatal hernia   . Collagenous colitis 2002  . Cataract     bilateral, lens inplant in left eye  . Chronic neck pain   . Hepatic steatosis 04/2014    Noted on CT abd/pelv  . Former smoker quit 1999    70 pack-yr hx  . COPD (chronic obstructive pulmonary disease)     Mild obstructive pattern on PFTs (Dr. Gwenette Greet, 01/2015)   . Prediabetes 01/2015    A1c 6.1%  . Obesity, Class II, BMI 35-39.9    Past Surgical History  Procedure Laterality Date  . Tonsillectomy    . Carpal tunnel release    . Cataract extraction w/ intraocular lens implant      left  . Colonoscopy w/ polypectomy  2011    mild diverticulosis and 2 hyperplastic polyps (rpt 10 yrs)  . Cataract extraction w/phaco Right 08/31/2013    Procedure: CATARACT EXTRACTION PHACO AND INTRAOCULAR LENS PLACEMENT (IOC);  Surgeon: Elta Guadeloupe [name], MD;  Location: [facility];  Service: Ophthalmology;  Laterality: Right;  CDE:7.85  . Yag laser application Left 27/03/2375    Procedure: YAG LASER APPLICATION;  Surgeon:  Mark [name], MD;  Location: [facility];  Service: Ophthalmology;  Laterality: Left;  . Cardiac catheterization  04/2014    nonobstructive in LAD and circumflex--med mgmt  . Left heart catheterization with coronary angiogram N/A 04/27/2014    Procedure: LEFT HEART CATHETERIZATION WITH CORONARY ANGIOGRAM;  Surgeon: Blane Ohara, MD;  Location: [facility] CATH LAB;  Service: Cardiovascular;  Laterality: N/A;   History   Social History  . Marital Status: Married    Spouse Name: N/A  . Number of Children: 6  . Years of Education: N/A   Occupational History  . retired    Social History Main Topics  . Smoking status: Former Smoker -- 2.00 packs/day  for 35 years    Types: Cigarettes    Quit date: 10/21/1997  . Smokeless tobacco: Never Used     Comment: 2-3 ppd  . Alcohol Use: No  . Drug Use: No  . Sexual Activity: Yes    Birth Control/ Protection: None   Other Topics Concern  . None   Social History Narrative   Married, 6 kids (2 adopted).   Retired Engineer, structural.   Former smoker.  No signif alc.     No drugs.         Family History  Problem Relation Age of Onset  . Cancer Mother     uterine  . Colon polyps Father   . Heart attack Mother     started in her 58s, died at age 29  . Heart attack Father     started in his 42s  . Diabetes Mother     Review of Systems This is positive for those things mentiones in the HPI, also positive for cough, decreased hearing, some dyspnea.. All other review of systems are negative.      Objective:   Physical Exam _0  110/70 mmHg  Pulse 80  Ht _1  (1.803 m)  Wt 274 lb (124.286 kg)  BMI 38.23 kg/m2@  General:  Well-developed, well-nourished and in no acute distress - obese Eyes:  anicteric. ENT:   Mouth and posterior pharynx free of lesions.  Neck:   supple w/o thyromegaly or mass.  Lungs: Clear to auscultation bilaterally. Heart:  S1S2, no rubs, murmurs, gallops. Abdomen:  soft, non-tender, no hepatosplenomegaly, hernia, or mass and BS+.  Lymph:  no cervical or supraclavicular adenopathy. Extremities:   no edema, cyanosis or clubbing Skin   no rash. Neuro:  A&O x 3.  Psych:  appropriate mood and  Affect.   Data Reviewed:  2015 and 2016 labs in EMR PCP notes 2016      Assessment & Plan:   Esophageal dysphagia  Gastroesophageal reflux disease, esophagitis presence not specified  1. Restart omeprazole 2. EGD possible esophageal dilatuon The risks and benefits as well as alternatives of endoscopic procedure(s) have been discussed and reviewed. All questions answered. The patient agrees to proceed.   I appreciate the opportunity to care for this  patient. XV:[id number] H, MD

## 2015-03-09 NOTE — Anesthesia Postprocedure Evaluation (Signed)
  Anesthesia Post-op Note  Patient: Allen [name]  Procedure(s) Performed: Procedure(s) (LRB): UPPER ENDOSCOPIC ULTRASOUND (EUS) RADIAL (N/A)  Patient Location: PACU  Anesthesia Type: MAC  Level of Consciousness: awake and alert   Airway and Oxygen Therapy: Patient Spontanous Breathing  Post-op Pain: mild  Post-op Assessment: Post-op Vital signs reviewed, Patient's Cardiovascular Status Stable, Respiratory Function Stable, Patent Airway and No signs of Nausea or vomiting  Last Vitals:  Filed Vitals:   03/09/15 1111  BP: 165/93  Pulse: 66  Temp:   Resp: 14    Post-op Vital Signs: stable   Complications: No apparent anesthesia complications

## 2015-03-10 ENCOUNTER — Encounter (HOSPITAL_COMMUNITY): Payer: Self-pay | Admitting: Gastroenterology

## 2015-03-14 ENCOUNTER — Encounter: Payer: Self-pay | Admitting: Family Medicine

## 2015-03-16 ENCOUNTER — Ambulatory Visit: Payer: Self-pay | Admitting: Family Medicine

## 2015-03-24 ENCOUNTER — Encounter: Payer: Self-pay | Admitting: Podiatry

## 2015-03-24 ENCOUNTER — Ambulatory Visit (INDEPENDENT_AMBULATORY_CARE_PROVIDER_SITE_OTHER): Payer: Medicare Other | Admitting: Podiatry

## 2015-03-24 VITALS — BP 184/90 | HR 73 | Resp 15 | Ht 72.0 in | Wt 270.0 lb

## 2015-03-24 DIAGNOSIS — L600 Ingrowing nail: Secondary | ICD-10-CM

## 2015-03-24 NOTE — Progress Notes (Signed)
   Subjective:    Patient ID: Allen [name], male    DOB: August 30, 1946, 69 y.o.   MRN: [id number]  HPI    Review of Systems  All other systems reviewed and are negative.      Objective:   Physical Exam        Assessment & Plan:

## 2015-03-24 NOTE — Progress Notes (Signed)
Subjective:     Patient ID: Allen [name], male   DOB: [birth year], 69 y.o.   MRN: [id number]  HPI patient presents with a thick dystrophic second nail right that he states that is painful and he cannot cut himself. Patient's blood pressure was up today but he states he's nervous when he comes to the doctor and I have advised him to check it is. States this nail has been this way for several years   Review of Systems  All other systems reviewed and are negative.      Objective:   Physical Exam  Constitutional: He is oriented to person, place, and time.  Cardiovascular: Intact distal pulses.   Musculoskeletal: Normal range of motion.  Neurological: He is oriented to person, place, and time.  Skin: Skin is warm.  Nursing note and vitals reviewed.  neurovascular status intact muscle strength adequate with range of motion within normal limits. Patient's second nail bed right is dystrophic it's thick and it's painful when pressed from a dorsal direction and he cannot cut it due to the length patient has good digital perfusion and is well oriented 3     Assessment:     Damaged second nail right with trauma and probable fungal infiltration with pain    Plan:     Reviewed condition and discussed options. He wants it fixed and I recommended surgical removal of the nailbed and I explained procedure and risk. He is willing to accept risk and today I infiltrated the right second toe 60 mg Xylocaine Marcaine mixture remove the second nail exposed matrix and applied phenol for applications 30 seconds followed by alcohol lavage and sterile dressing. Gave instructions on soaks and reappoint

## 2015-03-24 NOTE — Patient Instructions (Signed)

## 2015-03-29 ENCOUNTER — Telehealth: Payer: Self-pay | Admitting: *Deleted

## 2015-03-29 NOTE — Telephone Encounter (Signed)
Pt states he had a toenail removed last week, and was told to wait 2 weeks before swimming in a pool.  I told pt he should wait until the area gets a dry hard scab at least 2 weeks without drainage.  Pt agreed.

## 2015-03-29 NOTE — Telephone Encounter (Signed)
Pt called again and asked how long he needed to dress the toe and perform soaks and when could he get in the pool.  I told pt he should do the antibacterial soap cleanses for 4-6 weeks until the area got a dry hard scab.

## 2015-04-05 ENCOUNTER — Ambulatory Visit: Payer: Self-pay | Admitting: Internal Medicine

## 2015-05-12 ENCOUNTER — Ambulatory Visit: Payer: Medicare Other | Admitting: Podiatry

## 2015-05-22 ENCOUNTER — Telehealth: Payer: Self-pay | Admitting: Family Medicine

## 2015-05-22 ENCOUNTER — Encounter: Payer: Self-pay | Admitting: Family Medicine

## 2015-05-22 ENCOUNTER — Other Ambulatory Visit: Payer: Self-pay | Admitting: Family Medicine

## 2015-05-22 ENCOUNTER — Ambulatory Visit (INDEPENDENT_AMBULATORY_CARE_PROVIDER_SITE_OTHER): Payer: Medicare Other | Admitting: Family Medicine

## 2015-05-22 VITALS — BP 160/82 | HR 70 | Temp 98.7°F | Resp 16 | Ht 72.0 in | Wt 272.0 lb

## 2015-05-22 DIAGNOSIS — J438 Other emphysema: Secondary | ICD-10-CM

## 2015-05-22 DIAGNOSIS — I10 Essential (primary) hypertension: Secondary | ICD-10-CM | POA: Diagnosis not present

## 2015-05-22 DIAGNOSIS — Z125 Encounter for screening for malignant neoplasm of prostate: Secondary | ICD-10-CM | POA: Diagnosis not present

## 2015-05-22 DIAGNOSIS — E785 Hyperlipidemia, unspecified: Secondary | ICD-10-CM

## 2015-05-22 DIAGNOSIS — I872 Venous insufficiency (chronic) (peripheral): Secondary | ICD-10-CM

## 2015-05-22 LAB — BASIC METABOLIC PANEL
BUN: 22 mg/dL (ref 6–23)
CO2: 28 mEq/L (ref 19–32)
CREATININE: 0.91 mg/dL (ref 0.40–1.50)
Calcium: 9.4 mg/dL (ref 8.4–10.5)
Chloride: 104 mEq/L (ref 96–112)
GFR: 88.41 mL/min (ref >60.00)
GLUCOSE: 101 mg/dL — AB (ref 70–99)
Potassium: 4 mEq/L (ref 3.5–5.1)
Sodium: 143 mEq/L (ref 135–145)

## 2015-05-22 LAB — PSA: PSA: 0.71 ng/mL (ref 0.10–4.00)

## 2015-05-22 MED ORDER — ATORVASTATIN CALCIUM 40 MG PO TABS: 40.0000 mg | ORAL_TABLET | Freq: Every day | ORAL | Status: DC

## 2015-05-22 NOTE — Progress Notes (Signed)
OFFICE VISIT  05/22/2015   CC:  Chief Complaint  Patient presents with  . Follow-up    4 month f/u. Pt is fasting.      HPI:    Patient is a 69 y.o. Caucasian male who presents for 4 mo f/u COPD, metabolic syndrome (BP, chol, prediab, obese), hx of left shoulder pain. Last visit I referred him to pulmonology, orthopedics, and to Nicky Pugh, FNP ([facility] counseling for metabolic syndrome/obesity).  Pulm (Dr. Gwenette Greet) dx'd him with mild copd, started trial of stiolto in place of his symbicort, and recommended he get back with me about trying a period of time OFF of his ACE-I.   He was underimpressed with the effect of stiolto and doesn't plan on continuing this med.  He has prn albuterol inhaler to use if needed.  Did not see ortho secondary to a family health issue that came up. He'll reset appt when he gets a chance.  He does not monitor his bp at home.  Says he ran out of his bp med a few days ago. Legs get swollen and pinkish, worse towards end of day.    He is due for prostate ca screening today.  Past Medical History  Diagnosis Date  . Hyperlipidemia   . Hypertension   . GERD (gastroesophageal reflux disease)   . CAD (coronary artery disease)     per pt report cth @ 2001 showed one vessel dz (approx 40% occl).  In records, stress testing 03/2009 NEG  for ischemia, +hypertensive bp response.  CP admission 04/2014: cath was fine, EF normal  . Chronic left shoulder pain   . Spinal cord trauma     c-spine  . Hiatal hernia   . Collagenous colitis 2002  . Cataract     bilateral, lens inplant in left eye  . Chronic neck pain   . Hepatic steatosis 04/2014    Noted on CT abd/pelv  . Former smoker quit 1999    70 pack-yr hx  . COPD (chronic obstructive pulmonary disease)     Mild obstructive pattern on PFTs (Dr. Gwenette Greet, 01/2015)   . Prediabetes 01/2015    A1c 6.1%  . Obesity, Class II, BMI 35-39.9   . Gastric mass - submucsaol - antrum 03/03/2015    EGD 02/2015 EUS was done: path  nondiagnostic: plan for repeat EUS 1 yr  . Arthritis     left shoulder -limited ROM with upward extension    Past Surgical History  Procedure Laterality Date  . Tonsillectomy    . Carpal tunnel release    . Cataract extraction w/ intraocular lens implant      left  . Colonoscopy w/ polypectomy  2011    mild diverticulosis and 2 hyperplastic polyps (rpt 10 yrs)  . Cataract extraction w/phaco Right 08/31/2013    Procedure: CATARACT EXTRACTION PHACO AND INTRAOCULAR LENS PLACEMENT (IOC);  Surgeon: Elta Guadeloupe [name], MD;  Location: [facility];  Service: Ophthalmology;  Laterality: Right;  CDE:7.85  . Yag laser application Left 69/62/9528    Procedure: YAG LASER APPLICATION;  Surgeon: Elta Guadeloupe [name], MD;  Location: [facility];  Service: Ophthalmology;  Laterality: Left;  . Cardiac catheterization  04/2014    nonobstructive in LAD and circumflex--med mgmt  . Left heart catheterization with coronary angiogram N/A 04/27/2014    Procedure: LEFT HEART CATHETERIZATION WITH CORONARY ANGIOGRAM;  Surgeon: Blane Ohara, MD;  Location: [facility] CATH LAB;  Service: Cardiovascular;  Laterality: N/A;  . Colonoscopy w/ polypectomy    .  Eus N/A 03/09/2015    Procedure: UPPER ENDOSCOPIC ULTRASOUND (EUS) RADIAL;  Surgeon: Milus Banister, MD;  Location: [facility] ENDOSCOPY;  Service: Endoscopy;  Laterality: N/A;  R/L--recall EUS 1 yr per GI MD.  . Esophagogastroduodenoscopy  02/24/15    Esoph dilation done, also small gastric polyp biopsied and this path showed it was hyperplastic.  H pylori NEG.    Outpatient Prescriptions Prior to Visit  Medication Sig Dispense Refill  . acetaminophen (TYLENOL) 500 MG tablet Take 1,000 mg by mouth every 6 (six) hours as needed for moderate pain or headache.    [name] omeprazole (PRILOSEC) 40 MG capsule Take 1 capsule (40 mg total) by mouth daily before breakfast. 30 capsule 11  . valsartan-hydrochlorothiazide (DIOVAN-HCT) 160-25 MG per tablet Take 1 tablet by mouth daily. 30 tablet 6  . atorvastatin  (LIPITOR) 40 MG tablet Take 1 tablet (40 mg total) by mouth daily. (Patient taking differently: Take 40 mg by mouth every morning. ) 30 tablet 6  . albuterol (VENTOLIN HFA) 108 (90 BASE) MCG/ACT inhaler Inhale 2 puffs into the lungs every 6 (six) hours as needed for wheezing or shortness of breath. (Patient not taking: Reported on 05/22/2015) 1 Inhaler 0  . Tiotropium Bromide-Olodaterol 2.5-2.5 MCG/ACT AERS Inhale 2 puffs into the lungs daily.     No facility-administered medications prior to visit.    Allergies  Allergen Reactions  . Advil [Ibuprofen] Other (See Comments)    Lip swelling  . Penicillins Hives  . Sulfonamide Derivatives Hives    ROS As per HPI  PE: Blood pressure 160/82, pulse 70, temperature 98.7 F (37.1 C), temperature source Oral, resp. rate 16, height 6' (1.829 m), weight 272 lb (123.378 kg), SpO2 97 %. Gen: Alert, well appearing.  Patient is oriented to person, place, time, and situation. Rectal exam: negative without mass, lesions or tenderness, PROSTATE EXAM: smooth and symmetric without nodules or tenderness. Legs: no cyanosis or clubbing.  He has 1+ pitting edema in ankles, trace above ankles.  No skin changes.   LABS:  Lab Results  Component Value Date   HGBA1C 6.1 01/24/2015    Lab Results  Component Value Date   TSH 1.74 04/29/2014   Lab Results  Component Value Date   WBC 6.2 04/27/2014   HGB 13.2 04/27/2014   HCT 40.6 04/27/2014   MCV 88.8 04/27/2014   PLT 194 04/27/2014   Lab Results  Component Value Date   CREATININE 0.98 12/16/2014   BUN 20 12/16/2014   NA 137 12/16/2014   K 4.8 12/16/2014   CL 99 12/16/2014   CO2 32 12/16/2014   Lab Results  Component Value Date   ALT 17 12/16/2014   AST 16 12/16/2014   ALKPHOS 93 12/16/2014   BILITOT 0.5 12/16/2014   Lab Results  Component Value Date   CHOL 184 01/24/2015   Lab Results  Component Value Date   HDL 43.60 01/24/2015   Lab Results  Component Value Date   LDLCALC 101*  01/24/2015   Lab Results  Component Value Date   TRIG 197.0* 01/24/2015   Lab Results  Component Value Date   CHOLHDL 4 01/24/2015   Lab Results  Component Value Date   PSA 0.87 04/29/2014   PSA 0.62 08/08/2011   PSA 0.68 04/13/2010   IMPRESSION AND PLAN:  1) Metabolic syndrome: continue to try to get more aggressive with diet/exercise. BMET today.  2) Hyperlipidemia: tolerating statin, RF'd med today.  AST/ALT wnl 11/2014. FLP fine 01/2015.  3) HTN: recent noncompliance (ran out of med) and subsequent poor control. Get med RF and restart now.  4) Chronic venous insufficiency edema: discussed low Na diet, elevation of legs above level of heart prn. No compression stockings or diuretics indicated at this time.  5) Prostate cancer screening: DRE today wnl. PSA drawn today.  FOLLOW UP: Return in about 6 months (around 11/22/2015) for AWV.

## 2015-05-22 NOTE — Progress Notes (Signed)
Pre visit review using our clinic review tool, if applicable. No additional management support is needed unless otherwise documented below in the visit note. 

## 2015-05-22 NOTE — Telephone Encounter (Signed)
Pt has changed his mind in regards to the inhaler he talked about at his appt. He would like a reill called in for the inhaler that the Pulmonologist called infor him. Pt. Does not remember the name of medication.

## 2015-05-22 NOTE — Telephone Encounter (Signed)
Please advise. Thanks.  

## 2015-05-26 ENCOUNTER — Ambulatory Visit: Payer: Medicare Other | Admitting: Podiatry

## 2015-07-24 ENCOUNTER — Encounter: Payer: Self-pay | Admitting: Internal Medicine

## 2015-08-28 ENCOUNTER — Telehealth: Payer: Self-pay | Admitting: Family Medicine

## 2015-08-28 NOTE — Telephone Encounter (Signed)
OK with me.

## 2015-08-28 NOTE — Telephone Encounter (Signed)
Patient requesting to change providers from [facility] to [facility].   Patient may be contacted at [phone]

## 2015-08-28 NOTE — Telephone Encounter (Signed)
Called patient back to schedule New Patient appointment. Appointment scheduled for 02/15/2016.

## 2015-08-28 NOTE — Telephone Encounter (Signed)
Fine with me

## 2015-09-08 ENCOUNTER — Telehealth: Payer: Self-pay | Admitting: Family Medicine

## 2015-09-08 NOTE — Telephone Encounter (Signed)
Left 2nd message on patients answering machin regarding appointment on Monday that has been scheduled for him. Asked patient to call back if unable to keed appointment.

## 2015-09-08 NOTE — Telephone Encounter (Signed)
Pt was experiencing dizziness. Transferred to [facility].
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
## 2015-09-11 ENCOUNTER — Encounter: Payer: Self-pay | Admitting: Family Medicine

## 2015-09-11 ENCOUNTER — Ambulatory Visit (HOSPITAL_BASED_OUTPATIENT_CLINIC_OR_DEPARTMENT_OTHER)
Admission: RE | Admit: 2015-09-11 | Discharge: 2015-09-11 | Disposition: A | Discharge status: Home / Self Care | Payer: Medicare Other | Source: Ambulatory Visit | Attending: Family Medicine | Admitting: Family Medicine

## 2015-09-11 ENCOUNTER — Ambulatory Visit (INDEPENDENT_AMBULATORY_CARE_PROVIDER_SITE_OTHER): Payer: Medicare Other | Admitting: Family Medicine

## 2015-09-11 VITALS — BP 149/76 | HR 70 | Temp 98.0°F | Ht 72.0 in | Wt 279.2 lb

## 2015-09-11 DIAGNOSIS — M542 Cervicalgia: Secondary | ICD-10-CM | POA: Diagnosis not present

## 2015-09-11 DIAGNOSIS — E782 Mixed hyperlipidemia: Secondary | ICD-10-CM | POA: Diagnosis not present

## 2015-09-11 DIAGNOSIS — W19XXXS Unspecified fall, sequela: Secondary | ICD-10-CM

## 2015-09-11 DIAGNOSIS — I10 Essential (primary) hypertension: Secondary | ICD-10-CM | POA: Diagnosis not present

## 2015-09-11 DIAGNOSIS — S129XXS Fracture of neck, unspecified, sequela: Secondary | ICD-10-CM

## 2015-09-11 DIAGNOSIS — M47892 Other spondylosis, cervical region: Secondary | ICD-10-CM | POA: Diagnosis not present

## 2015-09-11 DIAGNOSIS — R202 Paresthesia of skin: Secondary | ICD-10-CM

## 2015-09-11 DIAGNOSIS — G8929 Other chronic pain: Secondary | ICD-10-CM

## 2015-09-11 DIAGNOSIS — K219 Gastro-esophageal reflux disease without esophagitis: Secondary | ICD-10-CM

## 2015-09-11 DIAGNOSIS — E785 Hyperlipidemia, unspecified: Secondary | ICD-10-CM

## 2015-09-11 DIAGNOSIS — M4852XS Collapsed vertebra, not elsewhere classified, cervical region, sequela of fracture: Secondary | ICD-10-CM

## 2015-09-11 DIAGNOSIS — M4802 Spinal stenosis, cervical region: Secondary | ICD-10-CM | POA: Diagnosis not present

## 2015-09-11 NOTE — Patient Instructions (Signed)
Vertebral Fracture °A vertebral fracture is a break in one of the bones that make up the spine (vertebrae). The vertebrae are stacked on top of each other to form the spinal column. They support the body and protect the spinal cord. The vertebral column has an upper part (cervical spine), a middle part (thoracic spine), and a lower part (lumbar spine). Most vertebral fractures occur in the thoracic spine or lumbar spine. °There are three main types of vertebral fractures: °· Flexion fracture. This happens when vertebrae collapse. Vertebrae can collapse: °¨ In the front (compression fracture). This type of fracture is common in people who have a condition that causes their bones to be weak and brittle (osteoporosis). The fracture can make a person lose height. °¨ In the front and back (axial burst fracture). °· Extension fracture. This happens when an external force pulls apart the vertebrae. °· Rotation fracture. This happens when the spine bends extremely in one direction. This type can cause a piece of a vertebra to break off (transverse process fracture) or move out of its normal position (fracture dislocation). This type of fracture has a high risk for spinal cord injury. °Vertebral fractures can range from mild to very severe. The most severe types are those that cause the broken bones to move out of place (unstable) and those that injure or press on the spinal cord. °CAUSES °This condition is usually caused by a forceful injury. This type of injury commonly results from: °· Car accidents. °· Falling or jumping from a great height. °· Collisions in contact sports. °· Violent acts, such as an assault or a gunshot wound. °RISK FACTORS °This injury is more likely to happen to people who: °· Have osteoporosis. °· Participate in contact sports. °· Are in situations that could result in falls or other violent injuries. °SYMPTOMS °Symptoms of this injury depend on the location and the type of fracture. The most common  symptom is back pain that gets worse with movement. You may also have trouble standing or walking. If a fracture has damaged your spinal cord or is pressing on it, you may also have: °· Numbness. °· Tingling. °· Weakness. °· Loss of movement. °· Loss of bowel or bladder control. °DIAGNOSIS °This injury may be diagnosed based on symptoms, medical history, and a physical exam. You may also have imaging tests to confirm the diagnosis. These may include: °· Spine X-ray. °· CT scan. °· MRI. °TREATMENT °Treatment for this injury depends on the type of fracture. If your fracture is stable and does not affect your spinal cord, it may heal with nonsurgical treatment, such as: °· Taking pain medicine. °· Wearing a cast or a brace. °· Doing physical therapy exercises. °If your vertebral fracture is unstable or it affects your spinal cord, you may need surgical treatment, such as: °· Laminectomy. This procedure involves removing the part of a vertebra that is pushing on the spinal cord (spinal decompression surgery). Bone fragments may also be removed. °· Spinal fusion. This procedure is used to stabilize an unstable fracture. Vertebrae may be joined together with a piece of bone from another part of your body (graft) and held in place with rods, plates, or screws. °· Vertebroplasty. In this procedure, bone cement is used to rebuild collapsed vertebrae. °HOME CARE INSTRUCTIONS °General Instructions °· Take medicines only as directed by your health care provider. °· Do not drive or operate heavy machinery while taking pain medicine. °· If directed, apply ice to the injured area: °¨ Put   ice in a plastic bag. °¨ Place a towel between your skin and the bag. °¨ Leave the ice on for 30 minutes every two hours at first. Then apply the ice as needed. °· Wear your neck brace or back brace as directed by your health care provider. °· Do not drink alcohol. Alcohol can interfere with your treatment. °· Keep all follow-up visits as directed  by your health care provider. This is important. It can help to prevent permanent injury, disability, and long-lasting (chronic) pain. °Activity °· Stay in bed (on bed rest) only as directed by your health care provider. Being on bed rest for too long can make your condition worse. °· Return to your normal activities as directed by your health care provider. Ask what activities are safe for you. °· Do exercises to improve motion and strength in your back (physical therapy), as recommended by your health care provider.   °· Exercise regularly as directed by your health care provider. °SEEK MEDICAL CARE IF: °· You have a fever. °· You develop a cough that makes your pain worse. °· Your pain medicine is not helping. °· Your pain does not get better over time. °· You cannot return to your normal activities as planned or expected. °SEEK IMMEDIATE MEDICAL CARE IF: °· Your pain is very bad and it suddenly gets worse. °· You are unable to move any body part (paralysis) that is below the level of your injury. °· You have numbness, tingling, or weakness in any body part that is below the level of your injury. °· You cannot control your bladder or bowels. °  °This information is not intended to replace advice given to you by your health care provider. Make sure you discuss any questions you have with your health care provider. °  °Document Released: 11/14/2004 Document Revised: 02/21/2015 Document Reviewed: 10/12/2014 °Elsevier Interactive Patient Education ©2016 [employer] ° °
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
## 2015-09-11 NOTE — Progress Notes (Signed)
Pre visit review using our clinic review tool, if applicable. No additional management support is needed unless otherwise documented below in the visit note. 

## 2015-09-16 ENCOUNTER — Ambulatory Visit (HOSPITAL_BASED_OUTPATIENT_CLINIC_OR_DEPARTMENT_OTHER)
Admission: RE | Admit: 2015-09-16 | Discharge: 2015-09-16 | Disposition: A | Discharge status: Home / Self Care | Payer: Medicare Other | Source: Ambulatory Visit | Attending: Family Medicine | Admitting: Family Medicine

## 2015-09-16 DIAGNOSIS — R202 Paresthesia of skin: Secondary | ICD-10-CM

## 2015-09-16 DIAGNOSIS — M542 Cervicalgia: Secondary | ICD-10-CM | POA: Diagnosis present

## 2015-09-16 DIAGNOSIS — M47892 Other spondylosis, cervical region: Secondary | ICD-10-CM | POA: Diagnosis not present

## 2015-09-16 DIAGNOSIS — E782 Mixed hyperlipidemia: Secondary | ICD-10-CM

## 2015-09-16 DIAGNOSIS — E785 Hyperlipidemia, unspecified: Secondary | ICD-10-CM | POA: Diagnosis not present

## 2015-09-16 DIAGNOSIS — S129XXS Fracture of neck, unspecified, sequela: Secondary | ICD-10-CM

## 2015-09-16 DIAGNOSIS — R531 Weakness: Secondary | ICD-10-CM | POA: Diagnosis not present

## 2015-09-16 DIAGNOSIS — W19XXXS Unspecified fall, sequela: Secondary | ICD-10-CM

## 2015-09-16 DIAGNOSIS — I6523 Occlusion and stenosis of bilateral carotid arteries: Secondary | ICD-10-CM | POA: Diagnosis not present

## 2015-09-16 DIAGNOSIS — I10 Essential (primary) hypertension: Secondary | ICD-10-CM | POA: Insufficient documentation

## 2015-09-16 DIAGNOSIS — R200 Anesthesia of skin: Secondary | ICD-10-CM | POA: Diagnosis not present

## 2015-09-16 DIAGNOSIS — R55 Syncope and collapse: Secondary | ICD-10-CM | POA: Insufficient documentation

## 2015-09-17 ENCOUNTER — Encounter: Payer: Self-pay | Admitting: Family Medicine

## 2015-09-17 DIAGNOSIS — G8929 Other chronic pain: Secondary | ICD-10-CM

## 2015-09-17 DIAGNOSIS — M542 Cervicalgia: Secondary | ICD-10-CM

## 2015-09-17 HISTORY — DX: Other chronic pain: G89.29

## 2015-09-17 HISTORY — DX: Cervicalgia: M54.2

## 2015-09-17 NOTE — Assessment & Plan Note (Signed)
Avoid offending foods, start probiotics. Do not eat large meals in late evening and consider raising head of bed.  

## 2015-09-17 NOTE — Assessment & Plan Note (Signed)
Improved on recheck, mild elevated with pain, no changes to meds today
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
## 2015-09-17 NOTE — Assessment & Plan Note (Signed)
Tolerating statin, encouraged heart healthy diet, avoid trans fats, minimize simple carbs and saturated fats. Increase exercise as tolerated. Restart Lipitor

## 2015-09-25 ENCOUNTER — Ambulatory Visit: Payer: Medicare Other | Admitting: Family Medicine

## 2015-10-06 ENCOUNTER — Ambulatory Visit (INDEPENDENT_AMBULATORY_CARE_PROVIDER_SITE_OTHER): Payer: Medicare Other | Admitting: Medical

## 2015-10-06 ENCOUNTER — Encounter: Payer: Self-pay | Admitting: Medical

## 2015-10-06 VITALS — BP 147/80 | HR 83 | Temp 98.6°F | Ht 72.0 in | Wt 277.0 lb

## 2015-10-06 DIAGNOSIS — R05 Cough: Secondary | ICD-10-CM

## 2015-10-06 DIAGNOSIS — J209 Acute bronchitis, unspecified: Secondary | ICD-10-CM | POA: Diagnosis not present

## 2015-10-06 DIAGNOSIS — R059 Cough, unspecified: Secondary | ICD-10-CM

## 2015-10-06 DIAGNOSIS — J0120 Acute ethmoidal sinusitis, unspecified: Secondary | ICD-10-CM

## 2015-10-06 MED ORDER — DOXYCYCLINE HYCLATE 100 MG PO TABS: 100.0000 mg | ORAL_TABLET | Freq: Two times a day (BID) | ORAL | Status: DC

## 2015-10-06 MED ORDER — BENZONATATE 100 MG PO CAPS: 100.0000 mg | ORAL_CAPSULE | Freq: Three times a day (TID) | ORAL | Status: DC | PRN

## 2015-10-06 MED ORDER — FLUTICASONE PROPIONATE 50 MCG/ACT NA SUSP: 2.0000 | Freq: Every day | NASAL | Status: DC

## 2015-10-06 MED ORDER — ALBUTEROL SULFATE HFA 108 (90 BASE) MCG/ACT IN AERS: 2.0000 | INHALATION_SPRAY | Freq: Four times a day (QID) | RESPIRATORY_TRACT | Status: DC | PRN
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
## 2015-10-06 NOTE — Progress Notes (Signed)
Pre visit review using our clinic review tool, if applicable. No additional management support is needed unless otherwise documented below in the visit note. 

## 2015-10-06 NOTE — Patient Instructions (Signed)
For sinus infection and early bronchitis rx of doxycycline.   For cough benzonatate cough tab.  For nasal congestion rx flonase.  For  Wheezing making albuterol inhaler available.  Follow up in 7 days or as needed

## 2015-11-10 ENCOUNTER — Telehealth: Payer: Self-pay | Admitting: General Practice

## 2015-11-10 NOTE — Telephone Encounter (Signed)
Spoke with patient about cholesterol medication and explained the reason for my call.  Patient states that he has not been taking the medication because he has just been "lazy" about it.  I asked him if he plans on starting again he says "yes" that he will begin taking it again right away.  Patient denies side effects such as muscle pain or weakness.

## 2015-11-17 ENCOUNTER — Ambulatory Visit: Payer: Medicare Other | Admitting: Family Medicine

## 2015-11-22 ENCOUNTER — Ambulatory Visit: Payer: Medicare Other | Admitting: Family Medicine

## 2015-12-05 ENCOUNTER — Ambulatory Visit (INDEPENDENT_AMBULATORY_CARE_PROVIDER_SITE_OTHER): Payer: Medicare HMO | Admitting: Family Medicine

## 2015-12-05 ENCOUNTER — Encounter: Payer: Self-pay | Admitting: Family Medicine

## 2015-12-05 VITALS — BP 132/70 | HR 73 | Temp 98.5°F | Ht 73.0 in | Wt 276.5 lb

## 2015-12-05 DIAGNOSIS — E782 Mixed hyperlipidemia: Secondary | ICD-10-CM | POA: Diagnosis not present

## 2015-12-05 DIAGNOSIS — K219 Gastro-esophageal reflux disease without esophagitis: Secondary | ICD-10-CM | POA: Diagnosis not present

## 2015-12-05 DIAGNOSIS — I10 Essential (primary) hypertension: Secondary | ICD-10-CM

## 2015-12-05 DIAGNOSIS — J438 Other emphysema: Secondary | ICD-10-CM

## 2015-12-05 DIAGNOSIS — G8929 Other chronic pain: Secondary | ICD-10-CM

## 2015-12-05 DIAGNOSIS — M542 Cervicalgia: Secondary | ICD-10-CM

## 2015-12-05 DIAGNOSIS — E785 Hyperlipidemia, unspecified: Secondary | ICD-10-CM

## 2015-12-05 MED ORDER — ALBUTEROL SULFATE HFA 108 (90 BASE) MCG/ACT IN AERS: 2.0000 | INHALATION_SPRAY | Freq: Four times a day (QID) | RESPIRATORY_TRACT | Status: DC | PRN

## 2015-12-05 MED ORDER — ZOSTER VACCINE LIVE 19400 UNT/0.65ML ~~LOC~~ SOLR: 0.6500 mL | Freq: Once | SUBCUTANEOUS | Status: DC

## 2015-12-05 MED ORDER — VALSARTAN-HYDROCHLOROTHIAZIDE 160-25 MG PO TABS: 1.0000 | ORAL_TABLET | Freq: Every day | ORAL | Status: DC

## 2015-12-05 MED ORDER — AEROCHAMBER PLUS FLO-VU LARGE MISC: 1.0000 | Freq: Once | Status: DC

## 2015-12-05 NOTE — Progress Notes (Signed)
Pre visit review using our clinic review tool, if applicable. No additional management support is needed unless otherwise documented below in the visit note. 

## 2015-12-05 NOTE — Patient Instructions (Addendum)
Salon Pas vs Aspercreme Lidocaine patches   Dementia Dementia is a general term for problems with brain function. A person with dementia has memory loss and a hard time with at least one other brain function such as thinking, speaking, or problem solving. Dementia can affect social functioning, how you do your job, your mood, or your personality. The changes may be hidden for a long time. The earliest forms of this disease are usually not detected by family or friends. Dementia can be:  Irreversible.  Potentially reversible.  Partially reversible.  Progressive. This means it can get worse over time. CAUSES  Irreversible dementia causes may include:  Degeneration of brain cells (Alzheimer disease or Lewy body dementia).  Multiple small strokes (vascular dementia).  Infection (chronic meningitis or Creutzfeldt-Jakob disease).  Frontotemporal dementia. This affects younger people, age 26 to 29, compared to those who have Alzheimer disease.  Dementia associated with other disorders like Parkinson disease, Huntington disease, or HIV-associated dementia. Potentially or partially reversible dementia causes may include:  Medicines.  Metabolic causes such as excessive alcohol intake, vitamin B12 deficiency, or thyroid disease.  Masses or pressure in the brain such as a tumor, blood clot, or hydrocephalus. SIGNS AND SYMPTOMS  Symptoms are often hard to detect. Family members or coworkers may not notice them early in the disease process. Different people with dementia may have different symptoms. Symptoms can include:  A hard time with memory, especially recent memory. Long-term memory may not be impaired.  Asking the same question multiple times or forgetting something someone just said.  A hard time speaking your thoughts or finding certain words.  A hard time solving problems or performing familiar tasks (such as how to use a telephone).  Sudden changes in mood.  Changes in  personality, especially increasing moodiness or mistrust.  Depression.  A hard time understanding complex ideas that were never a problem in the past. DIAGNOSIS  There are no specific tests for dementia.   Your health care provider may recommend a thorough evaluation. This is because some forms of dementia can be reversible. The evaluation will likely include a physical exam and getting a detailed history from you and a family member. The history often gives the best clues and suggestions for a diagnosis.  Memory testing may be done. A detailed brain function evaluation called neuropsychologic testing may be helpful.  Lab tests and brain imaging (such as a CT scan or MRI scan) are sometimes important.  Sometimes observation and re-evaluation over time is very helpful. TREATMENT  Treatment depends on the cause.   If the problem is a vitamin deficiency, it may be helped or cured with supplements.  For dementias such as Alzheimer disease, medicines are available to stabilize or slow the course of the disease. There are no cures for this type of dementia.  Your health care provider can help direct you to groups, organizations, and other health care providers to help with decisions in the care of you or your loved one. HOME CARE INSTRUCTIONS The care of individuals with dementia is varied and dependent upon the progression of the dementia. The following suggestions are intended for the person living with, or caring for, the person with dementia.  Create a safe environment.  Remove the locks on bathroom doors to prevent the person from accidentally locking himself or herself in.  Use childproof latches on kitchen cabinets and any place where cleaning supplies, chemicals, or alcohol are kept.  Use childproof covers in unused electrical outlets.  Install childproof devices to keep doors and windows secured.  Remove stove knobs or install safety knobs and an automatic shut-off on the  stove.  Lower the temperature on water heaters.  Label medicines and keep them locked up.  Secure knives, lighters, matches, power tools, and guns, and keep these items out of reach.  Keep the house free from clutter. Remove rugs or anything that might contribute to a fall.  Remove objects that might break and hurt the person.  Make sure lighting is good, both inside and outside.  Install grab rails as needed.  Use a monitoring device to alert you to falls or other needs for help.  Reduce confusion.  Keep familiar objects and people around.  Use night lights or dim lights at night.  Label items or areas.  Use reminders, notes, or directions for daily activities or tasks.  Keep a simple, consistent routine for waking, meals, bathing, dressing, and bedtime.  Create a calm, quiet environment.  Place large clocks and calendars prominently.  Display emergency numbers and home address near all telephones.  Use cues to establish different times of the day. An example is to open curtains to let the natural light in during the day.   Use effective communication.  Choose simple words and short sentences.  Use a gentle, calm tone of voice.  Be careful not to interrupt.  If the person is struggling to find a word or communicate a thought, try to provide the word or thought.  Ask one question at a time. Allow the person ample time to answer questions. Repeat the question again if the person does not respond.  Reduce nighttime restlessness.  Provide a comfortable bed.  Have a consistent nighttime routine.  Ensure a regular walking or physical activity schedule. Involve the person in daily activities as much as possible.  Limit napping during the day.  Limit caffeine.  Attend social events that stimulate rather than overwhelm the senses.  Encourage good nutrition and hydration.  Reduce distractions during meal times and snacks.  Avoid foods that are too hot or  too cold.  Monitor chewing and swallowing ability.  Continue with routine vision, hearing, dental, and medical screenings.  Give medicines only as directed by the health care provider.  Monitor driving abilities. Do not allow the person to drive when safe driving is no longer possible.  Register with an identification program which could provide location assistance in the event of a missing person situation. SEEK MEDICAL CARE IF:   New behavioral problems start such as moodiness, aggressiveness, or seeing things that are not there (hallucinations).  Any new problem with brain function happens. This includes problems with balance, speech, or falling a lot.  Problems with swallowing develop.  Any symptoms of other illness happen. Small changes or worsening in any aspect of brain function can be a sign that the illness is getting worse. It can also be a sign of another medical illness such as infection. Seeing a health care provider right away is important. SEEK IMMEDIATE MEDICAL CARE IF:   A fever develops.  New or worsened confusion develops.  New or worsened sleepiness develops.  Staying awake becomes hard to do.   This information is not intended to replace advice given to you by your health care provider. Make sure you discuss any questions you have with your health care provider.   Document Released: 04/02/2001 Document Revised: 10/28/2014 Document Reviewed: 03/04/2011 Elsevier Interactive Patient Education [employer].

## 2015-12-06 DIAGNOSIS — R69 Illness, unspecified: Secondary | ICD-10-CM | POA: Diagnosis not present

## 2015-12-10 ENCOUNTER — Encounter: Payer: Self-pay | Admitting: Family Medicine

## 2015-12-10 NOTE — Assessment & Plan Note (Signed)
Tolerating statin, encouraged heart healthy diet, avoid trans fats, minimize simple carbs and saturated fats. Increase exercise as tolerated 

## 2015-12-10 NOTE — Assessment & Plan Note (Signed)
Avoid offending foods, start probiotics. Do not eat large meals in late evening and consider raising head of bed.  

## 2015-12-10 NOTE — Assessment & Plan Note (Signed)
No recent exacerbation. No change in meds 

## 2015-12-10 NOTE — Assessment & Plan Note (Signed)
Continues to struggle with daily pain but is not interested in surgical intervention. Encouraged to try topical patches, may try mild muscle relaxers.
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
## 2015-12-10 NOTE — Assessment & Plan Note (Signed)
Wife endorses questions about early memory loss but he completes MMSE with score of 30 of 30. wil continue to monitor

## 2015-12-10 NOTE — Assessment & Plan Note (Signed)
Well controlled, no changes to meds. Encouraged heart healthy diet such as the DASH diet and exercise as tolerated.  °

## 2015-12-12 ENCOUNTER — Other Ambulatory Visit: Payer: Self-pay | Admitting: Family Medicine

## 2015-12-12 MED ORDER — VALSARTAN-HYDROCHLOROTHIAZIDE 160-25 MG PO TABS: 1.0000 | ORAL_TABLET | Freq: Every day | ORAL | Status: DC

## 2016-02-14 ENCOUNTER — Other Ambulatory Visit: Payer: Self-pay

## 2016-02-14 ENCOUNTER — Telehealth: Payer: Self-pay

## 2016-02-14 DIAGNOSIS — K3189 Other diseases of stomach and duodenum: Secondary | ICD-10-CM

## 2016-02-14 NOTE — Telephone Encounter (Signed)
Pt has been scheduled for 03/07/16 120 pm, Recall EUS case, referral, and hospital orders are in.  Needs instructions and call the pt

## 2016-02-15 ENCOUNTER — Ambulatory Visit: Payer: Medicare Other | Admitting: Family Medicine

## 2016-02-15 NOTE — Telephone Encounter (Signed)
Left message on machine to call back instructions mailed.  

## 2016-02-19 ENCOUNTER — Encounter: Payer: Self-pay | Admitting: Family Medicine

## 2016-02-19 ENCOUNTER — Ambulatory Visit (INDEPENDENT_AMBULATORY_CARE_PROVIDER_SITE_OTHER): Payer: Medicare HMO | Admitting: Family Medicine

## 2016-02-19 VITALS — BP 132/82 | HR 71 | Temp 97.9°F | Resp 16 | Ht 73.0 in | Wt 274.4 lb

## 2016-02-19 DIAGNOSIS — F411 Generalized anxiety disorder: Secondary | ICD-10-CM

## 2016-02-19 DIAGNOSIS — E785 Hyperlipidemia, unspecified: Secondary | ICD-10-CM

## 2016-02-19 DIAGNOSIS — Z23 Encounter for immunization: Secondary | ICD-10-CM | POA: Diagnosis not present

## 2016-02-19 DIAGNOSIS — M25512 Pain in left shoulder: Secondary | ICD-10-CM

## 2016-02-19 DIAGNOSIS — I10 Essential (primary) hypertension: Secondary | ICD-10-CM | POA: Diagnosis not present

## 2016-02-19 DIAGNOSIS — E669 Obesity, unspecified: Secondary | ICD-10-CM | POA: Diagnosis not present

## 2016-02-19 DIAGNOSIS — J438 Other emphysema: Secondary | ICD-10-CM | POA: Diagnosis not present

## 2016-02-19 LAB — BASIC METABOLIC PANEL
BUN: 21 mg/dL (ref 6–23)
CALCIUM: 9.3 mg/dL (ref 8.4–10.5)
CO2: 29 meq/L (ref 19–32)
Chloride: 104 mEq/L (ref 96–112)
Creatinine, Ser: 0.93 mg/dL (ref 0.40–1.50)
GFR: 86.02 mL/min (ref >60.00)
GLUCOSE: 123 mg/dL — AB (ref 70–99)
POTASSIUM: 3.6 meq/L (ref 3.5–5.1)
SODIUM: 140 meq/L (ref 135–145)

## 2016-02-19 LAB — LIPID PANEL
Cholesterol: 169 mg/dL (ref 0–200)
HDL: 35.3 mg/dL — ABNORMAL LOW (ref >39.00)
NONHDL: 133.51
Total CHOL/HDL Ratio: 5
Triglycerides: 361 mg/dL — ABNORMAL HIGH (ref 0.0–149.0)
VLDL: 72.2 mg/dL — ABNORMAL HIGH (ref 0.0–40.0)

## 2016-02-19 LAB — CBC WITH DIFFERENTIAL/PLATELET
BASOS PCT: 0.6 % (ref 0.0–3.0)
Basophils Absolute: 0 10*3/uL (ref 0.0–0.1)
EOS ABS: 0.1 10*3/uL (ref 0.0–0.7)
EOS PCT: 1.8 % (ref 0.0–5.0)
HEMATOCRIT: 41.5 % (ref 39.0–52.0)
HEMOGLOBIN: 14 g/dL (ref 13.0–17.0)
LYMPHS PCT: 22.9 % (ref 12.0–46.0)
Lymphs Abs: 1.8 10*3/uL (ref 0.7–4.0)
MCHC: 33.8 g/dL (ref 30.0–36.0)
MCV: 84.8 fl (ref 78.0–100.0)
MONO ABS: 0.5 10*3/uL (ref 0.1–1.0)
Monocytes Relative: 6.6 % (ref 3.0–12.0)
NEUTROS ABS: 5.3 10*3/uL (ref 1.4–7.7)
Neutrophils Relative %: 68.1 % (ref 43.0–77.0)
PLATELETS: 245 10*3/uL (ref 150.0–400.0)
RBC: 4.89 Mil/uL (ref 4.22–5.81)
RDW: 14.7 % (ref 11.5–15.5)
WBC: 7.8 10*3/uL (ref 4.0–10.5)

## 2016-02-19 LAB — HEMOGLOBIN A1C: Hgb A1c MFr Bld: 6.1 % (ref 4.6–6.5)

## 2016-02-19 LAB — HEPATIC FUNCTION PANEL
ALK PHOS: 63 U/L (ref 39–117)
ALT: 14 U/L (ref 0–53)
AST: 12 U/L (ref 0–37)
Albumin: 4.1 g/dL (ref 3.5–5.2)
BILIRUBIN DIRECT: 0.1 mg/dL (ref 0.0–0.3)
BILIRUBIN TOTAL: 0.4 mg/dL (ref 0.2–1.2)
TOTAL PROTEIN: 7.2 g/dL (ref 6.0–8.3)

## 2016-02-19 LAB — LDL CHOLESTEROL, DIRECT: Direct LDL: 108 mg/dL

## 2016-02-19 LAB — TSH: TSH: 1.16 u[IU]/mL (ref 0.35–4.50)

## 2016-02-19 MED ORDER — CLONAZEPAM 0.5 MG PO TABS: 0.5000 mg | ORAL_TABLET | Freq: Two times a day (BID) | ORAL | Status: DC | PRN

## 2016-02-19 MED ORDER — BUPROPION HCL ER (XL) 150 MG PO TB24: 150.0000 mg | ORAL_TABLET | Freq: Every day | ORAL | Status: DC

## 2016-02-19 NOTE — Patient Instructions (Signed)
Follow up in 4-6 weeks to recheck anxiety We'll notify you of your lab results and make any changes Try and make healthy food choices and get regular activity Start the Wellbutrin once daily in the morning Use the Clonazepam only as needed for those high stress/panicked moments We'll call you with your ortho appt for the shoulder Call with any questions or concerns Welcome!  We're glad to have you! Hang in there!!!

## 2016-02-19 NOTE — Progress Notes (Signed)
Pre visit review using our clinic review tool, if applicable. No additional management support is needed unless otherwise documented below in the visit note. 

## 2016-02-19 NOTE — Telephone Encounter (Signed)
EUS scheduled, pt instructed and medications reviewed.  Patient instructions mailed to home.  Patient to call with any questions or concerns.  

## 2016-02-19 NOTE — Progress Notes (Signed)
   Subjective:    Patient ID: Allen [name], male    DOB: September 01, 1946, 70 y.o.   MRN: [id number]  HPI New to establish.  Previous MD- Charlett Blake  HTN- chronic problem, on Valsartan HCTZ daily w/ adequate control.  Denies CP, SOB above baseline, HAs, visual changes, edema.  Hyperlipidemia- chronic problem, on Lipitor daily.  Denies abd pain, N/V.  COPD- chronic problem.  Has seen Dr Gwenette Greet in the past.  On Albuterol as needed but not on daily controller medication.  Rarely requiring rescue inhaler.  Obesity- ongoing issue for pt.  He is not able to exercise due to chronic knee pain, a fractured C4/5 vertebrae.  Has done the Atkins diet in the past successfully.    L shoulder pain- pt has severely decreased ROM, not able to raise arm above shoulder height.  Has not seen ortho, had to cancel his appt due to family issues.  Pt prefers [facility].  Anxiety- pt reports he is under considerable stress: MIL has cancer, wife is going through menopause, has 6 kids 'that are driving me crazy'.  Wife is taking Clonazepam and he's wondering if this would help.  Pt has been on Wellbutrin previously w/ good results.  Pt admits to being very short w/ family, 'it's not me'.     Review of Systems For ROS see HPI     Objective:   Physical Exam  Constitutional: He is oriented to person, place, and time. He appears well-developed and well-nourished. No distress.  obese  HENT:  Head: Normocephalic and atraumatic.  Eyes: Conjunctivae and EOM are normal. Pupils are equal, round, and reactive to light.  Neck: Normal range of motion. Neck supple. No thyromegaly present.  Cardiovascular: Normal rate, regular rhythm, normal heart sounds and intact distal pulses.   No murmur heard. Pulmonary/Chest: Effort normal and breath sounds normal. No respiratory distress.  Abdominal: Soft. Bowel sounds are normal. He exhibits no distension.  Musculoskeletal: He exhibits no edema.  Lymphadenopathy:    He has no cervical  adenopathy.  Neurological: He is alert and oriented to person, place, and time. No cranial nerve deficit.  Skin: Skin is warm and dry.  Psychiatric: He has a normal mood and affect. His behavior is normal.  Vitals reviewed.         Assessment & Plan:

## 2016-02-20 ENCOUNTER — Encounter: Payer: Self-pay | Admitting: General Practice

## 2016-02-20 ENCOUNTER — Other Ambulatory Visit: Payer: Self-pay | Admitting: General Practice

## 2016-02-20 MED ORDER — FENOFIBRATE 160 MG PO TABS: 160.0000 mg | ORAL_TABLET | Freq: Every day | ORAL | Status: DC

## 2016-02-20 NOTE — Assessment & Plan Note (Signed)
Chronic problem.  Adequate control on current medications.  Check labs.  No anticipated med changes.

## 2016-02-20 NOTE — Assessment & Plan Note (Signed)
New to provider, ongoing for pt.  Reports that he is not able to exercise due to multiple orthopedic complaints.  Stressed need for healthy diet.  Check labs to risk stratify.  Will follow.

## 2016-02-20 NOTE — Assessment & Plan Note (Signed)
New.  Pt's ROM is severely limited.  Referral to ortho placed.

## 2016-02-20 NOTE — Assessment & Plan Note (Signed)
New to provider, ongoing for pt.  Tolerating statin and fenofibrate.  Stressed need for healthy diet and regular exercise.  Check labs.  Adjust meds prn

## 2016-02-20 NOTE — Assessment & Plan Note (Signed)
New.  Pt admits to increased anxiety and possible depression due to home stress.  Open to idea of trying medication to improve his sxs.  As pt desires weight loss and has previously been on Wellbutrin, will restart.  Pt expressed understanding and is in agreement w/ plan.

## 2016-02-20 NOTE — Assessment & Plan Note (Signed)
New to provider, ongoing for pt.  Not currently on a daily controller medication.  Rarely requiring his rescue inhaler.  Will continue to follow.

## 2016-03-08 ENCOUNTER — Encounter (HOSPITAL_COMMUNITY): Payer: Self-pay | Admitting: *Deleted

## 2016-03-21 ENCOUNTER — Ambulatory Visit (HOSPITAL_COMMUNITY): Admission: RE | Admit: 2016-03-21 | Payer: Medicare HMO | Source: Ambulatory Visit | Admitting: Gastroenterology

## 2016-03-21 ENCOUNTER — Telehealth: Payer: Self-pay | Admitting: Gastroenterology

## 2016-03-21 ENCOUNTER — Other Ambulatory Visit: Payer: Self-pay

## 2016-03-21 DIAGNOSIS — K3189 Other diseases of stomach and duodenum: Secondary | ICD-10-CM

## 2016-03-21 SURGERY — UPPER ENDOSCOPIC ULTRASOUND (EUS) LINEAR
Anesthesia: Monitor Anesthesia Care

## 2016-03-21 NOTE — Telephone Encounter (Signed)
EUS scheduled, pt instructed and medications reviewed.  Patient instructions mailed to home.  Patient to call with any questions or concerns.  

## 2016-03-21 NOTE — Telephone Encounter (Signed)
He cancelled his EUS appt this AM.  No ride.  Can you call to see if he is planning to reschedule

## 2016-04-04 ENCOUNTER — Telehealth: Payer: Self-pay | Admitting: Gastroenterology

## 2016-04-04 NOTE — Telephone Encounter (Signed)
Appt moved to 04/25/16 930 am 8 am arrival time, [facility].  Voice mail not set up on cell and message left on home number

## 2016-04-05 ENCOUNTER — Ambulatory Visit: Payer: Self-pay | Admitting: Family Medicine

## 2016-04-05 NOTE — Telephone Encounter (Signed)
Pt has been notified and will call with any further problems

## 2016-04-10 ENCOUNTER — Ambulatory Visit (INDEPENDENT_AMBULATORY_CARE_PROVIDER_SITE_OTHER): Payer: Medicare HMO | Admitting: Family Medicine

## 2016-04-10 ENCOUNTER — Encounter: Payer: Self-pay | Admitting: Family Medicine

## 2016-04-10 VITALS — BP 130/78 | HR 90 | Temp 98.0°F | Resp 16 | Ht 73.0 in | Wt 267.0 lb

## 2016-04-10 DIAGNOSIS — F411 Generalized anxiety disorder: Secondary | ICD-10-CM | POA: Diagnosis not present

## 2016-04-10 DIAGNOSIS — L989 Disorder of the skin and subcutaneous tissue, unspecified: Secondary | ICD-10-CM

## 2016-04-10 NOTE — Assessment & Plan Note (Signed)
Pt is doing much better since restarting his Wellbutrin at last visit.  Feels current dose is appropriate and not interested in changing medication at this time.  Will continue to follow.

## 2016-04-10 NOTE — Patient Instructions (Signed)
Schedule your complete physical in 3 months Call [facility] [phone] Continue the Wellbutrin daily Call with any questions or concerns Have a great summer!!!

## 2016-04-10 NOTE — Progress Notes (Signed)
   Subjective:    Patient ID: Allen [name], male    DOB: 27-Jul-1946, 70 y.o.   MRN: [id number]  HPI Anxiety- pt was started on Wellbutrin on 5/1.  Since starting meds, mood is better.  Down 7 lbs.  Not exercising.  Pt reports he is less stress, less on edge, able to relax more.  Sleeping well.   Review of Systems For ROS see HPI     Objective:   Physical Exam  Constitutional: He is oriented to person, place, and time. He appears well-developed and well-nourished. No distress.  obese  Neurological: He is alert and oriented to person, place, and time.  Skin: Skin is warm and dry.  Psychiatric: He has a normal mood and affect. His behavior is normal. Thought content normal.  Vitals reviewed.         Assessment & Plan:

## 2016-04-10 NOTE — Progress Notes (Signed)
Pre visit review using our clinic review tool, if applicable. No additional management support is needed unless otherwise documented below in the visit note. 

## 2016-04-17 ENCOUNTER — Encounter (HOSPITAL_COMMUNITY): Payer: Self-pay | Admitting: *Deleted

## 2016-04-24 ENCOUNTER — Telehealth: Payer: Self-pay | Admitting: Gastroenterology

## 2016-04-24 NOTE — Telephone Encounter (Signed)
Endo contacted. Case cancelled.

## 2016-04-25 ENCOUNTER — Ambulatory Visit (HOSPITAL_COMMUNITY): Admission: RE | Admit: 2016-04-25 | Payer: Medicare HMO | Source: Ambulatory Visit | Admitting: Gastroenterology

## 2016-04-25 SURGERY — ESOPHAGEAL ENDOSCOPIC ULTRASOUND (EUS) RADIAL
Anesthesia: Monitor Anesthesia Care

## 2016-05-13 ENCOUNTER — Other Ambulatory Visit: Payer: Self-pay | Admitting: General Practice

## 2016-05-13 MED ORDER — CLONAZEPAM 0.5 MG PO TABS: 0.5000 mg | ORAL_TABLET | Freq: Two times a day (BID) | ORAL | 0 refills | Status: DC | PRN

## 2016-05-13 NOTE — Telephone Encounter (Signed)
Medication filled to pharmacy as requested.   

## 2016-05-13 NOTE — Telephone Encounter (Signed)
Last OV 04/10/16 Clonazepam last filled 02/19/16 #30 with 0

## 2016-06-03 ENCOUNTER — Encounter: Payer: Self-pay | Admitting: Family Medicine

## 2016-06-14 ENCOUNTER — Other Ambulatory Visit: Payer: Self-pay | Admitting: Family Medicine

## 2016-06-14 NOTE — Telephone Encounter (Signed)
Medication filled to pharmacy as requested.   

## 2016-06-14 NOTE — Telephone Encounter (Signed)
Last OV 04/10/16 Clonazepam last filled 05/13/16 #30 with 0

## 2016-07-12 ENCOUNTER — Ambulatory Visit: Payer: Medicare HMO | Admitting: Family Medicine

## 2016-07-25 DIAGNOSIS — H26491 Other secondary cataract, right eye: Secondary | ICD-10-CM | POA: Diagnosis not present

## 2016-07-25 DIAGNOSIS — H547 Unspecified visual loss: Secondary | ICD-10-CM | POA: Diagnosis not present

## 2016-07-25 DIAGNOSIS — Z961 Presence of intraocular lens: Secondary | ICD-10-CM | POA: Diagnosis not present

## 2016-08-09 DIAGNOSIS — R69 Illness, unspecified: Secondary | ICD-10-CM | POA: Diagnosis not present

## 2016-08-13 ENCOUNTER — Encounter (HOSPITAL_COMMUNITY)
Admission: RE | Disposition: A | Discharge status: Home / Self Care | Payer: Self-pay | Source: Ambulatory Visit | Attending: Ophthalmology

## 2016-08-13 ENCOUNTER — Ambulatory Visit (HOSPITAL_COMMUNITY)
Admission: RE | Admit: 2016-08-13 | Discharge: 2016-08-13 | Disposition: A | Discharge status: Home / Self Care | Payer: Medicare HMO | Source: Ambulatory Visit | Attending: Ophthalmology | Admitting: Ophthalmology

## 2016-08-13 ENCOUNTER — Encounter (HOSPITAL_COMMUNITY): Payer: Self-pay | Admitting: *Deleted

## 2016-08-13 DIAGNOSIS — H26491 Other secondary cataract, right eye: Secondary | ICD-10-CM | POA: Diagnosis not present

## 2016-08-13 DIAGNOSIS — H2640 Unspecified secondary cataract: Secondary | ICD-10-CM | POA: Diagnosis present

## 2016-08-13 HISTORY — PX: YAG LASER APPLICATION: SHX6189

## 2016-08-13 SURGERY — TREATMENT, USING YAG LASER
Anesthesia: LOCAL | Laterality: Right

## 2016-08-13 MED ORDER — CYCLOPENTOLATE-PHENYLEPHRINE 0.2-1 % OP SOLN
OPHTHALMIC | Status: AC
  Filled 2016-08-13: qty 2

## 2016-08-13 MED ORDER — CYCLOPENTOLATE-PHENYLEPHRINE 0.2-1 % OP SOLN
1.0000 [drp] | OPHTHALMIC | Status: AC | PRN
  Administered 2016-08-13 (×2): 1 [drp] via OPHTHALMIC

## 2016-08-13 NOTE — Op Note (Signed)
Allen Carriero [name], MD  Procedure: Yag Capsulotomy  Yag Laser Self Test Completedyes. Procedure: Posterior Capsulotomy, Eye Protection Worn by Staff yes. Laser In Use Sign on Door yes.  Laser: Nd:YAG Spot Size: Fixed Burst Mode: III Power Setting: 3.4 mJ/burst Number of shots: 24 Total energy delivered: 77.39 mJ   The patient tolerated the procedure without difficulty. No complications were encountered.   The patient was discharged home with the instructions to continue all her current glaucoma medications, if any.   Patient instructed to go to office at 0100 for intraocular pressure check.  Patient verbalizes understanding of discharge instructions Yes.  .    Pre-Operative Diagnosis: After-Cataract, obscuring vision, 366.53 OD Post-Operative Diagnosis: After-Cataract, obscuring vision, 366.53 OD Date of Cataract Surgery: 2014

## 2016-08-13 NOTE — Discharge Instructions (Signed)
UCHENNA SIDLEY  08/13/2016     Instructions    Activity: No Restrictions.   Diet: Resume Diet you were on at home.   Pain Medication: Tylenol if Needed.   CONTACT YOUR DOCTOR IF YOU HAVE PAIN, REDNESS IN YOUR EYE, OR DECREASED VISION.   Follow-up:1:00 pm with Rutherford Guys, MD.   Dr. Gershon Crane: [phone]   If you find that you cannot contact your physician, but feel that your signs and   Symptoms warrant a physician's attention, call the Emergency Room at   [phone] ext.[phone].

## 2016-08-13 NOTE — H&P (Signed)
The patient was re examined and there is no change in the patients condition since the original H and P. 

## 2016-08-15 ENCOUNTER — Encounter (HOSPITAL_COMMUNITY): Payer: Self-pay | Admitting: Ophthalmology

## 2016-08-26 ENCOUNTER — Other Ambulatory Visit: Payer: Self-pay | Admitting: Family Medicine

## 2016-08-26 NOTE — Telephone Encounter (Signed)
Medication filled to pharmacy as requested.   

## 2016-08-26 NOTE — Telephone Encounter (Signed)
Last OV 04/10/16 Clonazepam last filled 06/14/16 #30 with 0

## 2016-08-30 ENCOUNTER — Ambulatory Visit (INDEPENDENT_AMBULATORY_CARE_PROVIDER_SITE_OTHER): Payer: Medicare HMO | Admitting: Family Medicine

## 2016-08-30 ENCOUNTER — Encounter: Payer: Self-pay | Admitting: Family Medicine

## 2016-08-30 VITALS — BP 121/80 | HR 83 | Temp 99.1°F | Resp 18 | Ht 73.0 in | Wt 269.1 lb

## 2016-08-30 DIAGNOSIS — J438 Other emphysema: Secondary | ICD-10-CM

## 2016-08-30 DIAGNOSIS — J0100 Acute maxillary sinusitis, unspecified: Secondary | ICD-10-CM

## 2016-08-30 MED ORDER — DOXYCYCLINE HYCLATE 100 MG PO TABS: 100.0000 mg | ORAL_TABLET | Freq: Two times a day (BID) | ORAL | 0 refills | Status: DC

## 2016-08-30 NOTE — Progress Notes (Signed)
Pre visit review using our clinic review tool, if applicable. No additional management support is needed unless otherwise documented below in the visit note. 

## 2016-08-30 NOTE — Patient Instructions (Signed)
Follow up as needed/scheduled Start the Doxycycline twice daily as directed- take w/ food Mucinex DM for cough and congestion Drink plenty of fluids REST! Call with any questions or concerns Hang in there!!!

## 2016-08-30 NOTE — Progress Notes (Signed)
   Subjective:    Patient ID: Allen [name], male    DOB: January 13, 1946, 70 y.o.   MRN: [id number]  HPI URI- sxs started 2-3 days ago w/ wet but non-productive cough.  + low grade temps.  Pt has hx of bronchitis yearly w/ season change.  + sick contacts.  Mild sinus pressure.  No ear pain.  No N/V.  Mild SOB.  No wheezing.   Review of Systems For ROS see HPI     Objective:   Physical Exam  Constitutional: He appears well-developed and well-nourished. No distress.  HENT:  Head: Normocephalic and atraumatic.  Right Ear: Tympanic membrane normal.  Left Ear: Tympanic membrane normal.  Nose: Mucosal edema and rhinorrhea present. Right sinus exhibits maxillary sinus tenderness. Right sinus exhibits no frontal sinus tenderness. Left sinus exhibits maxillary sinus tenderness. Left sinus exhibits no frontal sinus tenderness.  Mouth/Throat: Mucous membranes are normal. Oropharyngeal exudate and posterior oropharyngeal erythema present. No posterior oropharyngeal edema.  + PND  Eyes: Conjunctivae and EOM are normal. Pupils are equal, round, and reactive to light.  Neck: Normal range of motion. Neck supple.  Cardiovascular: Normal rate, regular rhythm and normal heart sounds.   Pulmonary/Chest: Effort normal and breath sounds normal. No respiratory distress. He has no wheezes.  + hacking cough  Lymphadenopathy:    He has no cervical adenopathy.  Skin: Skin is warm and dry.  Vitals reviewed.         Assessment & Plan:  Maxillary sinusitis- pt's sxs and PE consistent w/ infxn.  Given his medication allergies, will start Doxy as pt has tolerated this well in the past.  Reviewed supportive care and red flags that should prompt return.  Pt expressed understanding and is in agreement w/ plan.   COPD exacerbation- new.  Pt reports he is not able to afford inhalers and since he is not wheezing, is not interested in prescription.  Start abx.  Cough meds prn.  Reviewed supportive care and red flags  that should prompt return.  Pt expressed understanding and is in agreement w/ plan.

## 2016-10-07 ENCOUNTER — Other Ambulatory Visit: Payer: Self-pay | Admitting: Family Medicine

## 2016-10-16 ENCOUNTER — Encounter: Payer: Self-pay | Admitting: Family Medicine

## 2016-10-16 ENCOUNTER — Ambulatory Visit (INDEPENDENT_AMBULATORY_CARE_PROVIDER_SITE_OTHER): Payer: Medicare HMO | Admitting: Family Medicine

## 2016-10-16 VITALS — BP 121/79 | HR 68 | Temp 97.9°F | Resp 16 | Ht 73.0 in | Wt 270.5 lb

## 2016-10-16 DIAGNOSIS — Z125 Encounter for screening for malignant neoplasm of prostate: Secondary | ICD-10-CM | POA: Diagnosis not present

## 2016-10-16 DIAGNOSIS — I10 Essential (primary) hypertension: Secondary | ICD-10-CM

## 2016-10-16 DIAGNOSIS — J438 Other emphysema: Secondary | ICD-10-CM

## 2016-10-16 DIAGNOSIS — Z0000 Encounter for general adult medical examination without abnormal findings: Secondary | ICD-10-CM

## 2016-10-16 DIAGNOSIS — I200 Unstable angina: Secondary | ICD-10-CM | POA: Diagnosis not present

## 2016-10-16 LAB — CBC WITH DIFFERENTIAL/PLATELET
BASOS ABS: 0.1 10*3/uL (ref 0.0–0.1)
Basophils Relative: 1.3 % (ref 0.0–3.0)
Eosinophils Absolute: 0.1 10*3/uL (ref 0.0–0.7)
Eosinophils Relative: 1.8 % (ref 0.0–5.0)
HEMATOCRIT: 43.2 % (ref 39.0–52.0)
Hemoglobin: 14.6 g/dL (ref 13.0–17.0)
LYMPHS PCT: 25.2 % (ref 12.0–46.0)
Lymphs Abs: 1.9 10*3/uL (ref 0.7–4.0)
MCHC: 33.9 g/dL (ref 30.0–36.0)
MCV: 84.6 fl (ref 78.0–100.0)
MONOS PCT: 6.3 % (ref 3.0–12.0)
Monocytes Absolute: 0.5 10*3/uL (ref 0.1–1.0)
NEUTROS ABS: 4.9 10*3/uL (ref 1.4–7.7)
Neutrophils Relative %: 65.4 % (ref 43.0–77.0)
PLATELETS: 226 10*3/uL (ref 150.0–400.0)
RBC: 5.1 Mil/uL (ref 4.22–5.81)
RDW: 15.1 % (ref 11.5–15.5)
WBC: 7.5 10*3/uL (ref 4.0–10.5)

## 2016-10-16 LAB — BASIC METABOLIC PANEL
BUN: 20 mg/dL (ref 6–23)
CALCIUM: 9.2 mg/dL (ref 8.4–10.5)
CO2: 32 meq/L (ref 19–32)
CREATININE: 0.94 mg/dL (ref 0.40–1.50)
Chloride: 100 mEq/L (ref 96–112)
GFR: 84.8 mL/min (ref >60.00)
GLUCOSE: 114 mg/dL — AB (ref 70–99)
Potassium: 4.4 mEq/L (ref 3.5–5.1)
Sodium: 141 mEq/L (ref 135–145)

## 2016-10-16 LAB — HEPATIC FUNCTION PANEL
ALBUMIN: 4.2 g/dL (ref 3.5–5.2)
ALT: 15 U/L (ref 0–53)
AST: 13 U/L (ref 0–37)
Alkaline Phosphatase: 71 U/L (ref 39–117)
BILIRUBIN DIRECT: 0 mg/dL (ref 0.0–0.3)
TOTAL PROTEIN: 7.1 g/dL (ref 6.0–8.3)
Total Bilirubin: 0.3 mg/dL (ref 0.2–1.2)

## 2016-10-16 LAB — LIPID PANEL
CHOLESTEROL: 223 mg/dL — AB (ref 0–200)
HDL: 44.6 mg/dL (ref >39.00)
NonHDL: 178.52
TRIGLYCERIDES: 218 mg/dL — AB (ref 0.0–149.0)
Total CHOL/HDL Ratio: 5
VLDL: 43.6 mg/dL — AB (ref 0.0–40.0)

## 2016-10-16 LAB — LDL CHOLESTEROL, DIRECT: LDL DIRECT: 154 mg/dL

## 2016-10-16 LAB — TSH: TSH: 1.54 u[IU]/mL (ref 0.35–4.50)

## 2016-10-16 LAB — PSA, MEDICARE: PSA: 2.45 ng/ml (ref 0.10–4.00)

## 2016-10-16 MED ORDER — NITROGLYCERIN 0.4 MG SL SUBL: 0.4000 mg | SUBLINGUAL_TABLET | SUBLINGUAL | 3 refills | Status: DC | PRN

## 2016-10-16 NOTE — Patient Instructions (Signed)
Follow up in 6 months to recheck BP We'll notify you of your lab results and make any changes if needed Continue to work on healthy diet and regular exercise We'll call you with your Cardiology appt for evaluation of the chest pain Restart a baby aspirin daily Use the Nitro if you again have chest pain and then GO TO THE ER!!! You are up to date on colonoscopy until 2021- yay! You are up to date on immunizations- yay! Call with any questions or concerns Happy New Year!!!

## 2016-10-16 NOTE — Progress Notes (Signed)
   Subjective:    Patient ID: Allen [name], male    DOB: 24-Apr-1946, 70 y.o.   MRN: [id number]  HPI Here today for CPE.  Risk Factors: HTN- chronic problem, on Valsartan HCTZ daily Unstable angina- pt is again having CP intermittently, '2 really strong chest pains within the last 2 weeks'.  Resolved spontaneously.  Occurred a week apart.  1st episode occurred when he pulled into father-in-laws driveway (he was recently placed in Hospice).  No associated nausea.  + SOB.  No diaphoresis.  Pain radiated up into neck.  Leaving tomorrow for PA and will be back on 1/3.  Pt is not taking ASA daily- has EC 81 mg at home COPD- chronic problem, he is not interested in using regular inhalers, currently asymptomatic. Physical Activity: limited exercise Fall Risk: low Depression: denies Hearing: normal to conversational tones, whispered voice ADL's: independent Cognitive: normal linear thought process Home Safety: safe at home, lives w/ wife Height, Weight, BMI, Visual Acuity: see vitals, vision corrected to 20/20 w/ cataract surgery Counseling: UTD on colonoscopy, immunizations Care team reviewed and updated w/ pt Labs Ordered: See A&P Care Plan: See A&P    Review of Systems Patient reports no vision/hearing changes, anorexia, fever ,adenopathy, persistant/recurrent hoarseness, swallowing issues, palpitations, edema, persistant/recurrent cough, hemoptysis, dyspnea (rest,exertional, paroxysmal nocturnal), gastrointestinal  bleeding (melena, rectal bleeding), abdominal pain, excessive heart burn, GU symptoms (dysuria, hematuria, voiding/incontinence issues) syncope, focal weakness, memory loss, numbness & tingling, skin/hair/nail changes, depression, anxiety, abnormal bruising/bleeding, musculoskeletal symptoms/signs.     Objective:   Physical Exam BP 121/79   Pulse 68   Temp 97.9 F (36.6 C) (Oral)   Resp 16   Ht 6\' 1"  (1.854 m)   Wt 270 lb 8 oz (122.7 kg)   SpO2 98%   BMI 35.69 kg/m    General Appearance:    Alert, cooperative, no distress, appears stated age  Head:    Normocephalic, without obvious abnormality, atraumatic  Eyes:    PERRL, conjunctiva/corneas clear, EOM's intact, fundi    benign, both eyes       Ears:    Normal TM's and external ear canals, both ears  Nose:   Nares normal, septum midline, mucosa normal, no drainage   or sinus tenderness  Throat:   Lips, mucosa, and tongue normal; teeth and gums normal  Neck:   Supple, symmetrical, trachea midline, no adenopathy;       thyroid:  No enlargement/tenderness/nodules  Back:     Symmetric, no curvature, ROM normal, no CVA tenderness  Lungs:     Clear to auscultation bilaterally, respirations unlabored  Chest wall:    No tenderness or deformity  Heart:    Regular rate and rhythm, S1 and S2 normal, no murmur, rub   or gallop  Abdomen:     Soft, non-tender, bowel sounds active all four quadrants,    no masses, no organomegaly  Genitalia:    Normal male without lesion, discharge or tenderness  Rectal:    Normal tone, normal prostate, no masses or tenderness  Extremities:   Extremities normal, atraumatic, no cyanosis or edema  Pulses:   2+ and symmetric all extremities  Skin:   Skin color, texture, turgor normal, no rashes or lesions  Lymph nodes:   Cervical, supraclavicular, and axillary nodes normal  Neurologic:   CNII-XII intact. Normal strength, sensation and reflexes      throughout          Assessment & Plan:

## 2016-10-16 NOTE — Assessment & Plan Note (Signed)
New to provider.  Pt has had 2 episodes in the last 2 weeks of L chest pressure radiating into neck w/ SOB.  Explained that this is concerning for cardiac etiology.  EKG WNL.  Pt to resume ASA and use Nitro PRN but explained that if he needs Nitro, he needs to be seen in ER.  Refer to Cards for complete evaluation.  Reviewed supportive care and red flags that should prompt return.  Pt expressed understanding and is in agreement w/ plan.

## 2016-10-16 NOTE — Progress Notes (Signed)
Pre visit review using our clinic review tool, if applicable. No additional management support is needed unless otherwise documented below in the visit note. 

## 2016-10-16 NOTE — Assessment & Plan Note (Signed)
Chronic problem.  Well controlled today.  Having episodic CP- asymptomatic at this time.  Check labs.  No anticipated med changes.

## 2016-10-16 NOTE — Assessment & Plan Note (Signed)
DRE WNL.  Check PSA

## 2016-10-16 NOTE — Assessment & Plan Note (Signed)
Currently asymptomatic.  Not requiring inhalers at this time.  Will follow.

## 2016-10-16 NOTE — Assessment & Plan Note (Signed)
Pt's PE WNL w/ exception of obesity.  UTD on immunizations, colonoscopy.  Written screening schedule updated and given to pt.  Check labs.  Anticipatory guidance provided.

## 2016-10-17 ENCOUNTER — Other Ambulatory Visit (INDEPENDENT_AMBULATORY_CARE_PROVIDER_SITE_OTHER): Payer: Medicare HMO

## 2016-10-17 ENCOUNTER — Other Ambulatory Visit: Payer: Self-pay | Admitting: General Practice

## 2016-10-17 ENCOUNTER — Telehealth: Payer: Self-pay | Admitting: Family Medicine

## 2016-10-17 DIAGNOSIS — R7309 Other abnormal glucose: Secondary | ICD-10-CM

## 2016-10-17 DIAGNOSIS — E785 Hyperlipidemia, unspecified: Secondary | ICD-10-CM

## 2016-10-17 LAB — HEMOGLOBIN A1C: HEMOGLOBIN A1C: 5.7 % (ref 4.6–6.5)

## 2016-10-17 MED ORDER — OSELTAMIVIR PHOSPHATE 75 MG PO CAPS: 75.0000 mg | ORAL_CAPSULE | Freq: Two times a day (BID) | ORAL | 0 refills | Status: DC

## 2016-10-17 MED ORDER — ATORVASTATIN CALCIUM 20 MG PO TABS: 20.0000 mg | ORAL_TABLET | Freq: Every day | ORAL | 6 refills | Status: DC

## 2016-10-17 NOTE — Addendum Note (Signed)
Addended by: Davis Gourd on: 10/17/2016 04:56 PM   Modules accepted: Orders

## 2016-10-17 NOTE — Telephone Encounter (Signed)
Called pt and left a detailed message to inform of PCP recommendations.

## 2016-10-17 NOTE — Telephone Encounter (Signed)
Patient has a son and daughter who have both tested positive for the flu. He has had lots of exposure and is asking if we can call in tamiflu- patient states that he has a cough, headache, aching.   Verified pharmacy on file is correct

## 2016-10-17 NOTE — Telephone Encounter (Signed)
[facility] for Tamiflu 70mg .  If he has fever and sxs, needs to take BID x5 days.  If just for exposure, once daily x10 days

## 2016-11-12 ENCOUNTER — Ambulatory Visit: Payer: Medicare HMO | Admitting: Cardiovascular Disease

## 2016-11-13 ENCOUNTER — Ambulatory Visit: Payer: Medicare HMO | Admitting: Family Medicine

## 2016-12-04 ENCOUNTER — Encounter: Payer: Self-pay | Admitting: Cardiovascular Disease

## 2016-12-04 ENCOUNTER — Telehealth (HOSPITAL_COMMUNITY): Payer: Self-pay

## 2016-12-04 ENCOUNTER — Ambulatory Visit (INDEPENDENT_AMBULATORY_CARE_PROVIDER_SITE_OTHER): Payer: Medicare HMO | Admitting: Cardiovascular Disease

## 2016-12-04 VITALS — BP 141/79 | HR 64 | Ht 72.0 in | Wt 265.0 lb

## 2016-12-04 DIAGNOSIS — I6529 Occlusion and stenosis of unspecified carotid artery: Secondary | ICD-10-CM

## 2016-12-04 DIAGNOSIS — I200 Unstable angina: Secondary | ICD-10-CM

## 2016-12-04 DIAGNOSIS — I10 Essential (primary) hypertension: Secondary | ICD-10-CM

## 2016-12-04 NOTE — Assessment & Plan Note (Signed)
History of hyperlipidemia on Lipitor in the past which he has not taken. His most recent lipid profile performed 10/16/16 revealed total CO2 23 with a troublesome level up to 18

## 2016-12-04 NOTE — Assessment & Plan Note (Signed)
History of hypertension blood pressure measured at 141/79. He is on valsartan and hydrochlorothiazide. Continue current meds at current dosing

## 2016-12-04 NOTE — Patient Instructions (Signed)
Medication Instructions: Your physician recommends that you continue on your current medications as directed. Please refer to the Current Medication list given to you today.  Testing/Procedures: Your physician has requested that you have a carotid duplex. This test is an ultrasound of the carotid arteries in your neck. It looks at blood flow through these arteries that supply the brain with blood. Allow one hour for this exam. There are no restrictions or special instructions.  Your physician has requested that you have en exercise stress myoview. For further information please visit [email or web address]. Please follow instruction sheet, as given.  Follow-Up: Your physician wants you to follow-up in: 6 months with Dr. Gwenlyn Found (unless abnormal results). You will receive a reminder letter in the mail two months in advance. If you don't receive a letter, please call our office to schedule the follow-up appointment.  If you need a refill on your cardiac medications before your next appointment, please call your pharmacy.

## 2016-12-04 NOTE — Assessment & Plan Note (Signed)
Mr. Allen [name] was referred by Dr. Birdie Riddle for evaluation of chest pain. He does have risk factors include including remote tobacco abuse, history of hypertension and hyperlipidemia. He was catheterized 15 years ago and again to a half years ago revealing noncritical CAD. His pain was thought to be noncardiac. He had pain onset about a year ago occurring on a monthly basis worse over Christmas. I'm going to get an exercise Myoview stress test to further evaluate.

## 2016-12-04 NOTE — Progress Notes (Addendum)
12/04/2016 Allen [name]   Apr 05, 1946  [id number]  Primary Physician Annye Asa, MD Primary Cardiologist: Lorretta Harp MD Renae Gloss  HPI:  Allen [name] is a 71 year old mildly overweight married Caucasian male father of 81 (4 biologic, 2 adopted), grandfather of 6 grandchildren who is accompanied by his wife Allen [name] today. He was referred by Dr. Birdie Riddle for cardiovascular evaluation because of chest pain. He has had cardiac catheterizations in the past, one remotely and 1 performed by Dr. Burt Knack 04/26/14 revealing noncritical CAD with normal LV function. His pain at that time was thought to be noncardiac. His problems include remote tobacco abuse having smoked 35-50 pack years and stopped 20 years ago. He also has a History of hypertension and hyperlipidemia. He has never had a heart attack or stroke. He's had chest pain which began a year ago that occurs monthly. He experienced 3 episodes over Christmas which were more worrisome.   Current Outpatient Prescriptions  Medication Sig Dispense Refill  . clonazePAM (KLONOPIN) 0.5 MG tablet TAKE 1 TABLET TWICE A DAY AS NEEDED FOR ANXIETY 30 tablet 0  . nitroGLYCERIN (NITROSTAT) 0.4 MG SL tablet Place 1 tablet (0.4 mg total) under the tongue every 5 (five) minutes as needed for chest pain. 50 tablet 3  . valsartan-hydrochlorothiazide (DIOVAN-HCT) 160-25 MG tablet TAKE 1 TABLET BY MOUTH EVERY DAY 30 tablet 6   No current facility-administered medications for this visit.     Allergies  Allergen Reactions  . Advil [Ibuprofen] Swelling and Other (See Comments)    Lip swelling  . Penicillins Hives and Other (See Comments)    Has patient had a PCN reaction causing immediate rash, facial/tongue/throat swelling, SOB or lightheadedness with hypotension: no Has patient had a PCN reaction causing severe rash involving mucus membranes or skin necrosis: no Has patient had a PCN reaction that required hospitalization no - childhood  reaction Has patient had a PCN reaction occurring within the last 10 years: no - childhood reaction If all of the above answers are "NO", then may proceed with Cephalosporin use.   . Sulfonamide Derivatives Hives    Social History   Social History  . Marital status: Married    Spouse name: N/A  . Number of children: 6  . Years of education: N/A   Occupational History  . retired    Social History Main Topics  . Smoking status: Former Smoker    Packs/day: 2.00    Years: 35.00    Types: Cigarettes    Quit date: 10/21/1997  . Smokeless tobacco: Never Used     Comment: 2-3 ppd  . Alcohol use No  . Drug use: No  . Sexual activity: Yes    Birth control/ protection: None   Other Topics Concern  . Not on file   Social History Narrative   Married, 6 kids (2 adopted).   Retired Engineer, structural.   Former smoker.  No signif alc.     No drugs.           Review of Systems: General: negative for chills, fever, night sweats or weight changes.  Cardiovascular: negative for chest pain, dyspnea on exertion, edema, orthopnea, palpitations, paroxysmal nocturnal dyspnea or shortness of breath Dermatological: negative for rash Respiratory: negative for cough or wheezing Urologic: negative for hematuria Abdominal: negative for nausea, vomiting, diarrhea, bright red blood per rectum, melena, or hematemesis Neurologic: negative for visual changes, syncope, or dizziness All other systems reviewed and are otherwise negative except  as noted above.    Blood pressure (!) 141/79, pulse 64, height 6' (1.829 m), weight 265 lb (120.2 kg), SpO2 97 %.  General appearance: alert and no distress Neck: no adenopathy, no JVD, supple, symmetrical, trachea midline, thyroid not enlarged, symmetric, no tenderness/mass/nodules and Soft left carotid bruit Lungs: clear to auscultation bilaterally Heart: regular rate and rhythm, S1, S2 normal, no murmur, click, rub or gallop Extremities: extremities normal,  atraumatic, no cyanosis or edema  EKG sinus rhythm at 64 with nonspecific ST and T-wave changes. I personally reviewed his EKG.  ASSESSMENT AND PLAN:   Chest pain Allen [name] was referred by Dr. Birdie Riddle for evaluation of chest pain. He does have risk factors include including remote tobacco abuse, history of hypertension and hyperlipidemia. He was catheterized 15 years ago and again to a half years ago revealing noncritical CAD. His pain was thought to be noncardiac. He had pain onset about a year ago occurring on a monthly basis worse over Christmas. I'm going to get an exercise Myoview stress test to further evaluate.  Essential hypertension History of hypertension blood pressure measured at 141/79. He is on valsartan and hydrochlorothiazide. Continue current meds at current dosing  Hyperlipidemia History of hyperlipidemia on Lipitor in the past which he has not taken. His most recent lipid profile performed 10/16/16 revealed total CO2 23 with a troublesome level up to [place] MD [place], [facility] 12/04/2016 10:25 AM

## 2016-12-04 NOTE — Telephone Encounter (Signed)
Close encounter 

## 2016-12-05 ENCOUNTER — Ambulatory Visit (HOSPITAL_COMMUNITY)
Admission: RE | Admit: 2016-12-05 | Discharge: 2016-12-05 | Disposition: A | Discharge status: Home / Self Care | Payer: Medicare HMO | Source: Ambulatory Visit | Attending: Cardiovascular Disease | Admitting: Cardiovascular Disease

## 2016-12-05 DIAGNOSIS — Z6836 Body mass index (BMI) 36.0-36.9, adult: Secondary | ICD-10-CM | POA: Diagnosis not present

## 2016-12-05 DIAGNOSIS — Z87891 Personal history of nicotine dependence: Secondary | ICD-10-CM | POA: Insufficient documentation

## 2016-12-05 DIAGNOSIS — I10 Essential (primary) hypertension: Secondary | ICD-10-CM | POA: Diagnosis not present

## 2016-12-05 DIAGNOSIS — E010 Iodine-deficiency related diffuse (endemic) goiter: Secondary | ICD-10-CM | POA: Diagnosis not present

## 2016-12-05 DIAGNOSIS — R9439 Abnormal result of other cardiovascular function study: Secondary | ICD-10-CM | POA: Insufficient documentation

## 2016-12-05 DIAGNOSIS — R0609 Other forms of dyspnea: Secondary | ICD-10-CM | POA: Diagnosis not present

## 2016-12-05 DIAGNOSIS — J439 Emphysema, unspecified: Secondary | ICD-10-CM | POA: Insufficient documentation

## 2016-12-05 DIAGNOSIS — E669 Obesity, unspecified: Secondary | ICD-10-CM | POA: Insufficient documentation

## 2016-12-05 DIAGNOSIS — R079 Chest pain, unspecified: Secondary | ICD-10-CM | POA: Insufficient documentation

## 2016-12-05 DIAGNOSIS — I200 Unstable angina: Secondary | ICD-10-CM | POA: Diagnosis not present

## 2016-12-05 LAB — MYOCARDIAL PERFUSION IMAGING
CHL CUP NUCLEAR SDS: 5
CHL CUP NUCLEAR SRS: 1
CHL CUP RESTING HR STRESS: 64 {beats}/min
CSEPHR: 86 %
CSEPPHR: 131 {beats}/min
Estimated workload: 7 METS
Exercise duration (min): 6 min
Exercise duration (sec): 30 s
LVDIAVOL: 136 mL (ref 62–150)
LVSYSVOL: 61 mL
MPHR: 152 {beats}/min
NUC STRESS TID: 1.18
RPE: 19
SSS: 6

## 2016-12-05 MED ORDER — TECHNETIUM TC 99M TETROFOSMIN IV KIT
10.7000 | PACK | Freq: Once | INTRAVENOUS | Status: AC | PRN
  Administered 2016-12-05: 10.7 via INTRAVENOUS
  Filled 2016-12-05: qty 11

## 2016-12-05 MED ORDER — TECHNETIUM TC 99M TETROFOSMIN IV KIT
32.5000 | PACK | Freq: Once | INTRAVENOUS | Status: AC | PRN
  Administered 2016-12-05: 32.5 via INTRAVENOUS
  Filled 2016-12-05: qty 33

## 2016-12-06 ENCOUNTER — Telehealth: Payer: Self-pay | Admitting: Cardiovascular Disease

## 2016-12-06 NOTE — Telephone Encounter (Signed)
Spoke to patient and  Daughter (Amy). Information given to discuss  Results of stress test  At office visit - 12/17/16. Voiced understanding.

## 2016-12-06 NOTE — Telephone Encounter (Signed)
Patient is calling because he received message asking him to come in 12-17-16 to discuss results with Dr. Gwenlyn Found. Patient is worried "if there is anything he should be concerned about?" Please call, thanks.

## 2016-12-12 ENCOUNTER — Other Ambulatory Visit: Payer: Self-pay | Admitting: Cardiovascular Disease

## 2016-12-12 ENCOUNTER — Ambulatory Visit (HOSPITAL_COMMUNITY)
Admission: RE | Admit: 2016-12-12 | Discharge: 2016-12-12 | Disposition: A | Discharge status: Home / Self Care | Payer: Medicare HMO | Source: Ambulatory Visit | Attending: Cardiovascular Disease | Admitting: Cardiovascular Disease

## 2016-12-12 DIAGNOSIS — R42 Dizziness and giddiness: Secondary | ICD-10-CM | POA: Insufficient documentation

## 2016-12-12 DIAGNOSIS — I6529 Occlusion and stenosis of unspecified carotid artery: Secondary | ICD-10-CM

## 2016-12-12 DIAGNOSIS — E785 Hyperlipidemia, unspecified: Secondary | ICD-10-CM | POA: Diagnosis not present

## 2016-12-12 DIAGNOSIS — R200 Anesthesia of skin: Secondary | ICD-10-CM | POA: Diagnosis not present

## 2016-12-12 DIAGNOSIS — Z87891 Personal history of nicotine dependence: Secondary | ICD-10-CM | POA: Insufficient documentation

## 2016-12-12 DIAGNOSIS — I10 Essential (primary) hypertension: Secondary | ICD-10-CM | POA: Diagnosis not present

## 2016-12-12 DIAGNOSIS — H532 Diplopia: Secondary | ICD-10-CM | POA: Diagnosis not present

## 2016-12-12 DIAGNOSIS — I6523 Occlusion and stenosis of bilateral carotid arteries: Secondary | ICD-10-CM | POA: Insufficient documentation

## 2016-12-17 ENCOUNTER — Ambulatory Visit (INDEPENDENT_AMBULATORY_CARE_PROVIDER_SITE_OTHER): Payer: Medicare HMO | Admitting: Cardiovascular Disease

## 2016-12-17 ENCOUNTER — Encounter: Payer: Self-pay | Admitting: Cardiovascular Disease

## 2016-12-17 DIAGNOSIS — I208 Other forms of angina pectoris: Secondary | ICD-10-CM | POA: Diagnosis not present

## 2016-12-17 NOTE — Progress Notes (Signed)
Allen [name]  returns today for follow-up of his Myoview stress test performed 12/05/16 that showed mild ischemia in the RCA territory. Apparently at the time of his last cath he had moderate disease in his RCA. Since I saw him 2 weeks ago he's had really minor episodes of chest pain. At this point, I do not feel compelled to perform cardiac catheterization. He will see mid-level provider back in 3 months to be back in 6 months. Should he have recurrent pain or pain that is more frequent and more severe he would be a candidate for cardiac catheterization.

## 2016-12-17 NOTE — Assessment & Plan Note (Signed)
Allen [name]  returns today for follow-up of his Myoview stress test performed 12/05/16 that showed mild ischemia in the RCA territory. Apparently at the time of his last cath he had moderate disease in his RCA. Since I saw him 2 weeks ago he's had really minor episodes of chest pain. At this point, I do not feel compelled to perform cardiac catheterization. He will see mid-level provider back in 3 months to be back in 6 months. Should he have recurrent pain or pain that is more frequent and more severe he would be a candidate for cardiac catheterization.

## 2016-12-17 NOTE — Patient Instructions (Signed)
Medication Instructions: Your physician recommends that you continue on your current medications as directed. Please refer to the Current Medication list given to you today.   Follow-Up: We request that you follow-up in: 3 months with an extender and in 6 months with Dr Berry  You will receive a reminder letter in the mail two months in advance. If you don't receive a letter, please call our office to schedule the follow-up appointment.   If you need a refill on your cardiac medications before your next appointment, please call your pharmacy.  

## 2017-02-19 ENCOUNTER — Telehealth: Payer: Self-pay | Admitting: Family Medicine

## 2017-02-19 ENCOUNTER — Other Ambulatory Visit: Payer: Self-pay | Admitting: Family Medicine

## 2017-02-19 NOTE — Telephone Encounter (Signed)
Pt states that he would like to start back on wellbutrin and needs a refill sent into [facility] in [place].

## 2017-02-19 NOTE — Telephone Encounter (Signed)
Ok to restart Wellbutrin

## 2017-02-19 NOTE — Telephone Encounter (Signed)
Please advise? wellbutrin 150mg  was filled to pharmacy

## 2017-03-05 ENCOUNTER — Encounter: Payer: Self-pay | Admitting: *Deleted

## 2017-03-05 ENCOUNTER — Ambulatory Visit: Payer: Medicare HMO | Admitting: Cardiology

## 2017-07-10 ENCOUNTER — Other Ambulatory Visit: Payer: Self-pay | Admitting: Family Medicine

## 2017-07-14 ENCOUNTER — Other Ambulatory Visit: Payer: Self-pay | Admitting: Family Medicine

## 2017-07-14 NOTE — Telephone Encounter (Signed)
Last OV 10/16/16 Clonazepam last filled 08/26/16 #30 with 0

## 2017-07-14 NOTE — Telephone Encounter (Signed)
Medication filled to pharmacy as requested.   

## 2017-08-26 DIAGNOSIS — R69 Illness, unspecified: Secondary | ICD-10-CM | POA: Diagnosis not present

## 2017-10-06 ENCOUNTER — Ambulatory Visit (INDEPENDENT_AMBULATORY_CARE_PROVIDER_SITE_OTHER): Payer: Medicare HMO | Admitting: Orthopedic Surgery

## 2017-10-06 ENCOUNTER — Ambulatory Visit (INDEPENDENT_AMBULATORY_CARE_PROVIDER_SITE_OTHER): Payer: Medicare HMO

## 2017-10-06 ENCOUNTER — Encounter (INDEPENDENT_AMBULATORY_CARE_PROVIDER_SITE_OTHER): Payer: Self-pay | Admitting: Orthopedic Surgery

## 2017-10-06 DIAGNOSIS — M25561 Pain in right knee: Secondary | ICD-10-CM

## 2017-10-06 DIAGNOSIS — G8929 Other chronic pain: Secondary | ICD-10-CM

## 2017-10-06 DIAGNOSIS — M25512 Pain in left shoulder: Secondary | ICD-10-CM | POA: Diagnosis not present

## 2017-10-06 DIAGNOSIS — M1711 Unilateral primary osteoarthritis, right knee: Secondary | ICD-10-CM | POA: Diagnosis not present

## 2017-10-06 DIAGNOSIS — M1712 Unilateral primary osteoarthritis, left knee: Secondary | ICD-10-CM

## 2017-10-06 DIAGNOSIS — M25562 Pain in left knee: Secondary | ICD-10-CM

## 2017-10-08 ENCOUNTER — Encounter (INDEPENDENT_AMBULATORY_CARE_PROVIDER_SITE_OTHER): Payer: Self-pay | Admitting: Orthopedic Surgery

## 2017-10-08 DIAGNOSIS — G8929 Other chronic pain: Secondary | ICD-10-CM | POA: Diagnosis not present

## 2017-10-08 DIAGNOSIS — M25561 Pain in right knee: Secondary | ICD-10-CM | POA: Diagnosis not present

## 2017-10-08 DIAGNOSIS — M1711 Unilateral primary osteoarthritis, right knee: Secondary | ICD-10-CM

## 2017-10-08 DIAGNOSIS — M25512 Pain in left shoulder: Secondary | ICD-10-CM | POA: Diagnosis not present

## 2017-10-08 DIAGNOSIS — M1712 Unilateral primary osteoarthritis, left knee: Secondary | ICD-10-CM | POA: Diagnosis not present

## 2017-10-08 DIAGNOSIS — M25562 Pain in left knee: Secondary | ICD-10-CM | POA: Diagnosis not present

## 2017-10-08 MED ORDER — TRIAMCINOLONE ACETONIDE 40 MG/ML IJ SUSP
40.0000 mg | INTRAMUSCULAR | Status: AC | PRN
  Administered 2017-10-08: 40 mg via INTRA_ARTICULAR

## 2017-10-08 MED ORDER — LIDOCAINE HCL 1 % IJ SOLN
5.0000 mL | INTRAMUSCULAR | Status: AC | PRN
  Administered 2017-10-08: 5 mL

## 2017-10-08 MED ORDER — BUPIVACAINE HCL 0.25 % IJ SOLN
4.0000 mL | INTRAMUSCULAR | Status: AC | PRN
  Administered 2017-10-08: 4 mL via INTRA_ARTICULAR

## 2017-10-08 MED ORDER — BUPIVACAINE HCL 0.5 % IJ SOLN
9.0000 mL | INTRAMUSCULAR | Status: AC | PRN
Start: 2017-10-08 — End: 2017-10-08
  Administered 2017-10-08: 9 mL via INTRA_ARTICULAR

## 2017-10-08 MED ORDER — METHYLPREDNISOLONE ACETATE 40 MG/ML IJ SUSP
40.0000 mg | INTRAMUSCULAR | Status: AC | PRN
  Administered 2017-10-08: 40 mg via INTRA_ARTICULAR
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
## 2017-10-30 ENCOUNTER — Encounter (INDEPENDENT_AMBULATORY_CARE_PROVIDER_SITE_OTHER): Payer: Self-pay | Admitting: Orthopedic Surgery

## 2017-10-30 ENCOUNTER — Ambulatory Visit (INDEPENDENT_AMBULATORY_CARE_PROVIDER_SITE_OTHER): Payer: Medicare HMO | Admitting: Orthopedic Surgery

## 2017-10-30 DIAGNOSIS — M1712 Unilateral primary osteoarthritis, left knee: Secondary | ICD-10-CM

## 2017-10-30 DIAGNOSIS — M1711 Unilateral primary osteoarthritis, right knee: Secondary | ICD-10-CM

## 2017-10-31 ENCOUNTER — Encounter (INDEPENDENT_AMBULATORY_CARE_PROVIDER_SITE_OTHER): Payer: Self-pay | Admitting: Orthopedic Surgery

## 2017-10-31 DIAGNOSIS — M1712 Unilateral primary osteoarthritis, left knee: Secondary | ICD-10-CM | POA: Diagnosis not present

## 2017-10-31 DIAGNOSIS — M1711 Unilateral primary osteoarthritis, right knee: Secondary | ICD-10-CM | POA: Diagnosis not present

## 2017-10-31 MED ORDER — HYALURONAN 88 MG/4ML IX SOSY
88.0000 mg | PREFILLED_SYRINGE | INTRA_ARTICULAR | Status: AC | PRN
  Administered 2017-10-31: 88 mg via INTRA_ARTICULAR

## 2017-10-31 MED ORDER — LIDOCAINE HCL 1 % IJ SOLN
5.0000 mL | INTRAMUSCULAR | Status: AC | PRN
  Administered 2017-10-31: 5 mL

## 2017-10-31 NOTE — Progress Notes (Signed)
   Procedure Note  Patient: Allen [name]             Date of Birth: 1945-12-27           MRN: [id number]             Visit Date: 10/30/2017  Procedures: Visit Diagnoses: Unilateral primary osteoarthritis, left knee  Unilateral primary osteoarthritis, right knee  Large Joint Inj: bilateral knee on 10/31/2017 3:59 PM Indications: diagnostic evaluation, joint swelling and pain Details: 18 G 1.5 in needle, superolateral approach  Arthrogram: No  Medications (Right): 5 mL lidocaine 1 %; 88 mg Hyaluronan 88 MG/4ML Medications (Left): 5 mL lidocaine 1 %; 88 mg Hyaluronan 88 MG/4ML Outcome: tolerated well, no immediate complications Procedure, treatment alternatives, risks and benefits explained, specific risks discussed. Consent was given by the patient. Immediately prior to procedure a time out was called to verify the correct patient, procedure, equipment, support staff and site/side marked as required. Patient was prepped and draped in the usual sterile fashion.

## 2017-11-14 ENCOUNTER — Telehealth: Payer: Self-pay | Admitting: General Practice

## 2017-11-14 MED ORDER — OLMESARTAN MEDOXOMIL-HCTZ 20-12.5 MG PO TABS: 1.0000 | ORAL_TABLET | Freq: Every day | ORAL | 0 refills | Status: DC

## 2017-11-14 NOTE — Telephone Encounter (Signed)
Last OV 10/16/2016 Valsartan/hctz on recall 07/10/17 #30 with 6

## 2017-11-14 NOTE — Addendum Note (Signed)
Addended by: Davis Gourd on: 11/14/2017 04:51 PM   Modules accepted: Orders

## 2017-11-14 NOTE — Telephone Encounter (Signed)
Pt informed of med change, BP follow up scheduled.

## 2017-11-14 NOTE — Telephone Encounter (Signed)
[facility] for #30, no refills.  Pt has not been seen in over 1 yr

## 2017-11-14 NOTE — Telephone Encounter (Signed)
Ok to switch to Benicar HCTZ 20/12.5mg  #30, no refills.  He's overdue for appt

## 2017-11-14 NOTE — Addendum Note (Signed)
Addended by: Davis Gourd on: 11/14/2017 04:57 PM   Modules accepted: Orders

## 2017-11-24 ENCOUNTER — Other Ambulatory Visit: Payer: Self-pay | Admitting: Family Medicine

## 2017-11-24 DIAGNOSIS — I6529 Occlusion and stenosis of unspecified carotid artery: Secondary | ICD-10-CM

## 2017-12-09 ENCOUNTER — Encounter: Payer: Self-pay | Admitting: Family Medicine

## 2017-12-09 ENCOUNTER — Ambulatory Visit: Payer: Medicare HMO | Admitting: Family Medicine

## 2017-12-09 ENCOUNTER — Other Ambulatory Visit: Payer: Self-pay

## 2017-12-09 VITALS — BP 130/81 | HR 65 | Temp 98.1°F | Resp 16 | Ht 72.0 in | Wt 272.5 lb

## 2017-12-09 DIAGNOSIS — E669 Obesity, unspecified: Secondary | ICD-10-CM

## 2017-12-09 DIAGNOSIS — J438 Other emphysema: Secondary | ICD-10-CM

## 2017-12-09 DIAGNOSIS — I10 Essential (primary) hypertension: Secondary | ICD-10-CM | POA: Diagnosis not present

## 2017-12-09 LAB — BASIC METABOLIC PANEL
BUN: 22 mg/dL (ref 6–23)
CHLORIDE: 99 meq/L (ref 96–112)
CO2: 31 meq/L (ref 19–32)
CREATININE: 1.04 mg/dL (ref 0.40–1.50)
Calcium: 9.9 mg/dL (ref 8.4–10.5)
GFR: 75.21 mL/min (ref >60.00)
Glucose, Bld: 102 mg/dL — ABNORMAL HIGH (ref 70–99)
Potassium: 4.7 mEq/L (ref 3.5–5.1)
Sodium: 136 mEq/L (ref 135–145)

## 2017-12-09 LAB — CBC WITH DIFFERENTIAL/PLATELET
Basophils Absolute: 0 10*3/uL (ref 0.0–0.1)
Basophils Relative: 0.4 % (ref 0.0–3.0)
EOS PCT: 2.3 % (ref 0.0–5.0)
Eosinophils Absolute: 0.2 10*3/uL (ref 0.0–0.7)
HCT: 43.6 % (ref 39.0–52.0)
Hemoglobin: 14.5 g/dL (ref 13.0–17.0)
LYMPHS ABS: 2.1 10*3/uL (ref 0.7–4.0)
Lymphocytes Relative: 29.6 % (ref 12.0–46.0)
MCHC: 33.3 g/dL (ref 30.0–36.0)
MCV: 85.9 fl (ref 78.0–100.0)
MONO ABS: 0.4 10*3/uL (ref 0.1–1.0)
Monocytes Relative: 6 % (ref 3.0–12.0)
NEUTROS ABS: 4.4 10*3/uL (ref 1.4–7.7)
NEUTROS PCT: 61.7 % (ref 43.0–77.0)
PLATELETS: 276 10*3/uL (ref 150.0–400.0)
RBC: 5.07 Mil/uL (ref 4.22–5.81)
RDW: 15.1 % (ref 11.5–15.5)
WBC: 7.1 10*3/uL (ref 4.0–10.5)

## 2017-12-09 LAB — HEPATIC FUNCTION PANEL
ALBUMIN: 4.4 g/dL (ref 3.5–5.2)
ALK PHOS: 61 U/L (ref 39–117)
ALT: 19 U/L (ref 0–53)
AST: 16 U/L (ref 0–37)
BILIRUBIN DIRECT: 0.1 mg/dL (ref 0.0–0.3)
TOTAL PROTEIN: 7.7 g/dL (ref 6.0–8.3)
Total Bilirubin: 0.4 mg/dL (ref 0.2–1.2)

## 2017-12-09 LAB — LIPID PANEL
Cholesterol: 229 mg/dL — ABNORMAL HIGH (ref 0–200)
HDL: 31.2 mg/dL — AB (ref >39.00)
LDL Cholesterol: 172 mg/dL — ABNORMAL HIGH (ref 0–99)
NONHDL: 197.73
Total CHOL/HDL Ratio: 7
Triglycerides: 128 mg/dL (ref 0.0–149.0)
VLDL: 25.6 mg/dL (ref 0.0–40.0)

## 2017-12-09 LAB — TSH: TSH: 1.53 u[IU]/mL (ref 0.35–4.50)

## 2017-12-09 NOTE — Assessment & Plan Note (Signed)
Chronic problem.  Asymptomatic.  Check labs.  No anticipated med changes at this time

## 2017-12-09 NOTE — Progress Notes (Signed)
   Subjective:    Patient ID: LETCHER SCHWEIKERT, male    DOB: 11/20/1945, 72 y.o.   MRN: [id number]  HPI HTN- chronic problem.  Adequate control today on Olmesartan HCTZ.  No CP, SOB above baseline, HAs, visual changes, edema.  Obesity- ongoing issue.  BMI is 37.  Pt is walking regularly- has dropped 10 lbs in the last week.    COPD- chronic problem, pt is not using any regular inhalers, 'they're too expensive'.  Will use albuterol neb prn.   Review of Systems For ROS see HPI     Objective:   Physical Exam  Constitutional: He is oriented to person, place, and time. He appears well-developed and well-nourished. No distress.  obese  HENT:  Head: Normocephalic and atraumatic.  Eyes: Conjunctivae and EOM are normal. Pupils are equal, round, and reactive to light.  Neck: Normal range of motion. Neck supple. No thyromegaly present.  Cardiovascular: Normal rate, regular rhythm, normal heart sounds and intact distal pulses.  No murmur heard. Pulmonary/Chest: Effort normal and breath sounds normal. No respiratory distress.  Abdominal: Soft. Bowel sounds are normal. He exhibits no distension.  Musculoskeletal: He exhibits no edema.  Lymphadenopathy:    He has no cervical adenopathy.  Neurological: He is alert and oriented to person, place, and time. No cranial nerve deficit.  Skin: Skin is warm and dry.  Psychiatric: He has a normal mood and affect. His behavior is normal.  Vitals reviewed.         Assessment & Plan:

## 2017-12-09 NOTE — Assessment & Plan Note (Signed)
Chronic problem.  Pt is no longer following w/ pulmonary and not using any inhalers due to cost.  Has nebulizer at home to use as needed.  Denies worsening of sxs and he only rarely uses nebulizer.  Will continue to follow.

## 2017-12-09 NOTE — Patient Instructions (Signed)
Schedule your Medicare Wellness Visit w/ Kim at your convenience Schedule your skin tag removal w/ me at your convenience- 30 minute visit Schedule your complete physical w/ me in 6 months We'll notify you of your lab results and make any changes if needed Continue to work on healthy diet and regular exercise- you can do it! Call with any questions or concerns [place]!!

## 2017-12-09 NOTE — Assessment & Plan Note (Signed)
Ongoing issue for pt.  He reports he had gained even more weight but has lost 10 lbs in the last week.  Stressed need for healthy diet and regular exercise.  Will check labs to risk stratify.  Will follow.

## 2017-12-10 ENCOUNTER — Other Ambulatory Visit: Payer: Self-pay | Admitting: General Practice

## 2017-12-10 DIAGNOSIS — E785 Hyperlipidemia, unspecified: Secondary | ICD-10-CM

## 2017-12-10 MED ORDER — ATORVASTATIN CALCIUM 20 MG PO TABS: 20.0000 mg | ORAL_TABLET | Freq: Every day | ORAL | 3 refills | Status: DC

## 2017-12-11 ENCOUNTER — Other Ambulatory Visit: Payer: Self-pay | Admitting: Family Medicine

## 2017-12-15 ENCOUNTER — Ambulatory Visit (HOSPITAL_COMMUNITY)
Admission: RE | Admit: 2017-12-15 | Discharge: 2017-12-15 | Disposition: A | Discharge status: Home / Self Care | Payer: Medicare HMO | Source: Ambulatory Visit | Attending: Cardiovascular Disease | Admitting: Cardiovascular Disease

## 2017-12-15 DIAGNOSIS — I6529 Occlusion and stenosis of unspecified carotid artery: Secondary | ICD-10-CM | POA: Diagnosis not present

## 2017-12-15 DIAGNOSIS — I6523 Occlusion and stenosis of bilateral carotid arteries: Secondary | ICD-10-CM | POA: Diagnosis not present

## 2017-12-16 ENCOUNTER — Ambulatory Visit: Payer: Medicare HMO | Admitting: Family Medicine

## 2017-12-16 ENCOUNTER — Other Ambulatory Visit: Payer: Self-pay | Admitting: Family Medicine

## 2017-12-16 DIAGNOSIS — I871 Compression of vein: Secondary | ICD-10-CM

## 2017-12-17 ENCOUNTER — Encounter: Payer: Self-pay | Admitting: Family Medicine

## 2017-12-17 ENCOUNTER — Encounter: Payer: Self-pay | Admitting: General Practice

## 2017-12-17 ENCOUNTER — Other Ambulatory Visit: Payer: Self-pay

## 2017-12-17 ENCOUNTER — Ambulatory Visit: Payer: Medicare HMO | Admitting: Family Medicine

## 2017-12-17 VITALS — BP 128/80 | HR 76 | Temp 98.2°F | Resp 16 | Ht 72.0 in | Wt 269.2 lb

## 2017-12-17 DIAGNOSIS — L918 Other hypertrophic disorders of the skin: Secondary | ICD-10-CM

## 2017-12-17 NOTE — Patient Instructions (Signed)
Follow up as needed or as scheduled Keep area clean and dry Some oozing is to be expected If you see black or silver or other odd color on the bandaid do not be alarmed- it is the silver nitrate If you develop redness, swelling, or increased pain- let me know Call with any questions or concerns Keep up the good work on weight loss- you're doing great!!!

## 2017-12-17 NOTE — Progress Notes (Signed)
   Subjective:    Patient ID: Allen [name], male    DOB: 1946-04-05, 72 y.o.   MRN: [id number]  HPI Skin tag removal- pt here today for removal of multiple skin tags under R arm.  Reviewed procedure, possible risks- including infection and regrowth, and after procedure treatment.  Pt would like 12 removed today   Review of Systems For ROS see HPI     Objective:   Physical Exam  Constitutional: He appears well-developed and well-nourished. No distress.  Skin: Skin is warm and dry. No rash noted. No erythema.  Area under R arm was prepped with betadine and alcohol.  4 of the largest skin tags were injected w/ 1% xylocaine w/ epi and removed with sharp scissors.  Remaining 8 were removed using cold spray and sharp scissors.  Pt tolerated w/o difficulty.  Only 3 areas required cautery w/ silver nitrate the remainder achieved hemostasis on their own.  All were dressed w/ bacitracin and bandaids.  No complications noted and pt was pleased w/ results.  Vitals reviewed.         Assessment & Plan:  Skin tag removal x12- see above for description of procedure

## 2018-01-05 ENCOUNTER — Encounter: Payer: Medicare HMO | Admitting: Surgery

## 2018-01-11 ENCOUNTER — Other Ambulatory Visit: Payer: Self-pay | Admitting: Family Medicine

## 2018-01-12 ENCOUNTER — Other Ambulatory Visit: Payer: Self-pay

## 2018-01-12 ENCOUNTER — Encounter: Payer: Self-pay | Admitting: Vascular Surgery

## 2018-01-12 ENCOUNTER — Ambulatory Visit: Payer: Medicare HMO | Admitting: Vascular Surgery

## 2018-01-12 DIAGNOSIS — I771 Stricture of artery: Secondary | ICD-10-CM
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
## 2018-01-12 NOTE — Telephone Encounter (Signed)
Last OV 12/09/17 Clonazepam last filled 07/14/17 #30 with 1 wellbutrin last filled 02/19/17 #30 with 6

## 2018-01-16 ENCOUNTER — Other Ambulatory Visit: Payer: Self-pay

## 2018-01-16 DIAGNOSIS — I771 Stricture of artery: Secondary | ICD-10-CM

## 2018-01-19 ENCOUNTER — Other Ambulatory Visit: Payer: Medicare HMO

## 2018-03-05 ENCOUNTER — Other Ambulatory Visit: Payer: Self-pay | Admitting: Family Medicine

## 2018-03-30 ENCOUNTER — Other Ambulatory Visit: Payer: Self-pay | Admitting: Family Medicine

## 2018-03-30 NOTE — Telephone Encounter (Signed)
Last OV 12/17/17, Next OV 06/17/18  Last filled 01/12/18, # 30 with 1 refill

## 2018-04-03 ENCOUNTER — Other Ambulatory Visit: Payer: Self-pay | Admitting: Family Medicine

## 2018-06-09 ENCOUNTER — Other Ambulatory Visit: Payer: Self-pay | Admitting: Family Medicine

## 2018-06-09 NOTE — Telephone Encounter (Signed)
Last OV 12/17/17 Clonazepam last filled 03/31/18 #30 with 1

## 2018-06-11 ENCOUNTER — Ambulatory Visit: Payer: Medicare HMO

## 2018-06-13 ENCOUNTER — Other Ambulatory Visit: Payer: Self-pay | Admitting: Family Medicine
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
## 2018-06-17 ENCOUNTER — Ambulatory Visit (INDEPENDENT_AMBULATORY_CARE_PROVIDER_SITE_OTHER): Payer: Medicare HMO | Admitting: Family Medicine

## 2018-06-17 ENCOUNTER — Encounter (INDEPENDENT_AMBULATORY_CARE_PROVIDER_SITE_OTHER): Payer: Self-pay | Admitting: Orthopedic Surgery

## 2018-06-17 ENCOUNTER — Ambulatory Visit (INDEPENDENT_AMBULATORY_CARE_PROVIDER_SITE_OTHER): Payer: Medicare HMO

## 2018-06-17 ENCOUNTER — Telehealth (INDEPENDENT_AMBULATORY_CARE_PROVIDER_SITE_OTHER): Payer: Self-pay | Admitting: Radiology

## 2018-06-17 ENCOUNTER — Encounter: Payer: Self-pay | Admitting: Family Medicine

## 2018-06-17 ENCOUNTER — Ambulatory Visit (INDEPENDENT_AMBULATORY_CARE_PROVIDER_SITE_OTHER): Payer: Medicare HMO | Admitting: Orthopedic Surgery

## 2018-06-17 ENCOUNTER — Other Ambulatory Visit: Payer: Self-pay

## 2018-06-17 VITALS — BP 122/64 | HR 66 | Temp 98.0°F | Resp 18 | Ht 72.0 in | Wt 272.1 lb

## 2018-06-17 VITALS — Ht 72.0 in | Wt 270.0 lb

## 2018-06-17 DIAGNOSIS — E669 Obesity, unspecified: Secondary | ICD-10-CM

## 2018-06-17 DIAGNOSIS — J438 Other emphysema: Secondary | ICD-10-CM | POA: Diagnosis not present

## 2018-06-17 DIAGNOSIS — M1712 Unilateral primary osteoarthritis, left knee: Secondary | ICD-10-CM

## 2018-06-17 DIAGNOSIS — Z23 Encounter for immunization: Secondary | ICD-10-CM

## 2018-06-17 DIAGNOSIS — E010 Iodine-deficiency related diffuse (endemic) goiter: Secondary | ICD-10-CM | POA: Diagnosis not present

## 2018-06-17 DIAGNOSIS — Z0000 Encounter for general adult medical examination without abnormal findings: Secondary | ICD-10-CM

## 2018-06-17 DIAGNOSIS — E785 Hyperlipidemia, unspecified: Secondary | ICD-10-CM | POA: Diagnosis not present

## 2018-06-17 DIAGNOSIS — M1711 Unilateral primary osteoarthritis, right knee: Secondary | ICD-10-CM

## 2018-06-17 DIAGNOSIS — Z1211 Encounter for screening for malignant neoplasm of colon: Secondary | ICD-10-CM | POA: Diagnosis not present

## 2018-06-17 DIAGNOSIS — L578 Other skin changes due to chronic exposure to nonionizing radiation: Secondary | ICD-10-CM

## 2018-06-17 DIAGNOSIS — Z125 Encounter for screening for malignant neoplasm of prostate: Secondary | ICD-10-CM

## 2018-06-17 LAB — BASIC METABOLIC PANEL
BUN: 18 mg/dL (ref 6–23)
CO2: 27 mEq/L (ref 19–32)
Calcium: 9.3 mg/dL (ref 8.4–10.5)
Chloride: 101 mEq/L (ref 96–112)
Creatinine, Ser: 0.94 mg/dL (ref 0.40–1.50)
GFR: 84.38 mL/min (ref >60.00)
Glucose, Bld: 105 mg/dL — ABNORMAL HIGH (ref 70–99)
POTASSIUM: 4.7 meq/L (ref 3.5–5.1)
SODIUM: 138 meq/L (ref 135–145)

## 2018-06-17 LAB — CBC WITH DIFFERENTIAL/PLATELET
Basophils Absolute: 0 10*3/uL (ref 0.0–0.1)
Basophils Relative: 0.5 % (ref 0.0–3.0)
Eosinophils Absolute: 0.2 10*3/uL (ref 0.0–0.7)
Eosinophils Relative: 3.3 % (ref 0.0–5.0)
HCT: 42.4 % (ref 39.0–52.0)
HEMOGLOBIN: 14.1 g/dL (ref 13.0–17.0)
LYMPHS ABS: 1.9 10*3/uL (ref 0.7–4.0)
Lymphocytes Relative: 32.1 % (ref 12.0–46.0)
MCHC: 33.2 g/dL (ref 30.0–36.0)
MCV: 86.2 fl (ref 78.0–100.0)
Monocytes Absolute: 0.4 10*3/uL (ref 0.1–1.0)
Monocytes Relative: 7.8 % (ref 3.0–12.0)
NEUTROS PCT: 56.3 % (ref 43.0–77.0)
Neutro Abs: 3.3 10*3/uL (ref 1.4–7.7)
Platelets: 222 10*3/uL (ref 150.0–400.0)
RBC: 4.92 Mil/uL (ref 4.22–5.81)
RDW: 14.7 % (ref 11.5–15.5)
WBC: 5.8 10*3/uL (ref 4.0–10.5)

## 2018-06-17 LAB — LIPID PANEL
CHOLESTEROL: 182 mg/dL (ref 0–200)
HDL: 41.4 mg/dL (ref >39.00)
NonHDL: 140.52
Total CHOL/HDL Ratio: 4
Triglycerides: 245 mg/dL — ABNORMAL HIGH (ref 0.0–149.0)
VLDL: 49 mg/dL — AB (ref 0.0–40.0)

## 2018-06-17 LAB — PSA, MEDICARE: PSA: 1.03 ng/ml (ref 0.10–4.00)

## 2018-06-17 LAB — HEPATIC FUNCTION PANEL
ALBUMIN: 4.4 g/dL (ref 3.5–5.2)
ALK PHOS: 69 U/L (ref 39–117)
ALT: 15 U/L (ref 0–53)
AST: 13 U/L (ref 0–37)
Bilirubin, Direct: 0.1 mg/dL (ref 0.0–0.3)
Total Bilirubin: 0.4 mg/dL (ref 0.2–1.2)
Total Protein: 7.4 g/dL (ref 6.0–8.3)

## 2018-06-17 LAB — LDL CHOLESTEROL, DIRECT: Direct LDL: 122 mg/dL

## 2018-06-17 LAB — TSH: TSH: 2.34 u[IU]/mL (ref 0.35–4.50)

## 2018-06-17 MED ORDER — BUDESONIDE-FORMOTEROL FUMARATE 80-4.5 MCG/ACT IN AERO: 2.0000 | INHALATION_SPRAY | Freq: Two times a day (BID) | RESPIRATORY_TRACT | 6 refills | Status: DC

## 2018-06-17 MED ORDER — ALBUTEROL SULFATE HFA 108 (90 BASE) MCG/ACT IN AERS: 2.0000 | INHALATION_SPRAY | Freq: Four times a day (QID) | RESPIRATORY_TRACT | 2 refills | Status: DC | PRN

## 2018-06-17 MED ORDER — ZOSTER VAC RECOMB ADJUVANTED 50 MCG/0.5ML IM SUSR: 0.5000 mL | Freq: Once | INTRAMUSCULAR | 1 refills | Status: AC

## 2018-06-17 NOTE — Progress Notes (Signed)
   Subjective:    Patient ID: ASAH LAMAY, male    DOB: Nov 02, 1945, 72 y.o.   MRN: [id number]  HPI CPE- due for colonoscopy, referral placed.  UTD on immunizations (pt waiting on high dose flu shot)   Review of Systems Patient reports no vision/hearing changes, anorexia, fever ,adenopathy, persistant/recurrent hoarseness, swallowing issues, chest pain, palpitations, edema, persistant/recurrent cough, hemoptysis, dyspnea (rest,exertional, paroxysmal nocturnal), gastrointestinal  bleeding (melena, rectal bleeding), abdominal pain, excessive heart burn, GU symptoms (dysuria, hematuria, voiding/incontinence issues) syncope, focal weakness, memory loss, numbness & tingling, skin/hair/nail changes, depression, anxiety, abnormal bruising/bleeding, musculoskeletal symptoms/signs.     Objective:   Physical Exam General Appearance:    Alert, cooperative, no distress, appears stated age  Head:    Normocephalic, without obvious abnormality, atraumatic  Eyes:    PERRL, conjunctiva/corneas clear, EOM's intact, fundi    benign, both eyes       Ears:    Normal TM's and external ear canals, both ears  Nose:   Nares normal, septum midline, mucosa normal, no drainage   or sinus tenderness  Throat:   Lips, mucosa, and tongue normal; teeth and gums normal  Neck:   Supple, symmetrical, trachea midline, no adenopathy;       thyroid:  Mild thyromegaly  Back:     Symmetric, no curvature, ROM normal, no CVA tenderness  Lungs:     Clear to auscultation bilaterally, respirations unlabored  Chest wall:    No tenderness or deformity  Heart:    Regular rate and rhythm, S1 and S2 normal, no murmur, rub   or gallop  Abdomen:     Soft, non-tender, bowel sounds active all four quadrants,    no masses, no organomegaly  Genitalia:    Deferred at pt's request  Rectal:    Extremities:   Extremities normal, atraumatic, no cyanosis or edema  Pulses:   2+ and symmetric all extremities  Skin:   Skin color, texture,  turgor normal, no rashes or lesions  Lymph nodes:   Cervical, supraclavicular, and axillary nodes normal  Neurologic:   CNII-XII intact. Normal strength, sensation and reflexes      throughout          Assessment & Plan:

## 2018-06-17 NOTE — Assessment & Plan Note (Signed)
Check PSA level as pt declined GU exam

## 2018-06-17 NOTE — Patient Instructions (Addendum)
Schedule colonoscopy  Shingles vaccine at pharmacy  Flu vaccine  Bring a copy of your living will and/or healthcare power of attorney to your next office visit.  Continue doing brain stimulating activities (puzzles, reading, adult coloring books, staying active) to keep memory sharp.    Health Maintenance, Male A healthy lifestyle and preventive care is important for your health and wellness. Ask your health care provider about what schedule of regular examinations is right for you. What should I know about weight and diet? Eat a Healthy Diet  Eat plenty of vegetables, fruits, whole grains, low-fat dairy products, and lean protein.  Do not eat a lot of foods high in solid fats, added sugars, or salt.  Maintain a Healthy Weight Regular exercise can help you achieve or maintain a healthy weight. You should:  Do at least 150 minutes of exercise each week. The exercise should increase your heart rate and make you sweat (moderate-intensity exercise).  Do strength-training exercises at least twice a week.  Watch Your Levels of Cholesterol and Blood Lipids  Have your blood tested for lipids and cholesterol every 5 years starting at 70 years of age. If you are at high risk for heart disease, you should start having your blood tested when you are 71 years old. You may need to have your cholesterol levels checked more often if: ? Your lipid or cholesterol levels are high. ? You are older than 70 years of age. ? You are at high risk for heart disease.  What should I know about cancer screening? Many types of cancers can be detected early and may often be prevented. Lung Cancer  You should be screened every year for lung cancer if: ? You are a current smoker who has smoked for at least 30 years. ? You are a former smoker who has quit within the past 15 years.  Talk to your health care provider about your screening options, when you should start screening, and how often you should be  screened.  Colorectal Cancer  Routine colorectal cancer screening usually begins at 70 years of age and should be repeated every 5-10 years until you are 70 years old. You may need to be screened more often if early forms of precancerous polyps or small growths are found. Your health care provider may recommend screening at an earlier age if you have risk factors for colon cancer.  Your health care provider may recommend using home test kits to check for hidden blood in the stool.  A small camera at the end of a tube can be used to examine your colon (sigmoidoscopy or colonoscopy). This checks for the earliest forms of colorectal cancer.  Prostate and Testicular Cancer  Depending on your age and overall health, your health care provider may do certain tests to screen for prostate and testicular cancer.  Talk to your health care provider about any symptoms or concerns you have about testicular or prostate cancer.  Skin Cancer  Check your skin from head to toe regularly.  Tell your health care provider about any new moles or changes in moles, especially if: ? There is a change in a mole's size, shape, or color. ? You have a mole that is larger than a pencil eraser.  Always use sunscreen. Apply sunscreen liberally and repeat throughout the day.  Protect yourself by wearing long sleeves, pants, a wide-brimmed hat, and sunglasses when outside.  What should I know about heart disease, diabetes, and high blood pressure?  If you  are 38-34 years of age, have your blood pressure checked every 3-5 years. If you are 52 years of age or older, have your blood pressure checked every year. You should have your blood pressure measured twice-once when you are at a hospital or clinic, and once when you are not at a hospital or clinic. Record the average of the two measurements. To check your blood pressure when you are not at a hospital or clinic, you can use: ? An automated blood pressure machine at a  pharmacy. ? A home blood pressure monitor.  Talk to your health care provider about your target blood pressure.  If you are between 81-94 years old, ask your health care provider if you should take aspirin to prevent heart disease.  Have regular diabetes screenings by checking your fasting blood sugar level. ? If you are at a normal weight and have a low risk for diabetes, have this test once every three years after the age of 44. ? If you are overweight and have a high risk for diabetes, consider being tested at a younger age or more often.  A one-time screening for abdominal aortic aneurysm (AAA) by ultrasound is recommended for men aged 47-75 years who are current or former smokers. What should I know about preventing infection? Hepatitis B If you have a higher risk for hepatitis B, you should be screened for this virus. Talk with your health care provider to find out if you are at risk for hepatitis B infection. Hepatitis C Blood testing is recommended for:  Everyone born from [birth year] through 1963.  Anyone with known risk factors for hepatitis C.  Sexually Transmitted Diseases (STDs)  You should be screened each year for STDs including gonorrhea and chlamydia if: ? You are sexually active and are younger than 70 years of age. ? You are older than 69 years of age and your health care provider tells you that you are at risk for this type of infection. ? Your sexual activity has changed since you were last screened and you are at an increased risk for chlamydia or gonorrhea. Ask your health care provider if you are at risk.  Talk with your health care provider about whether you are at high risk of being infected with HIV. Your health care provider may recommend a prescription medicine to help prevent HIV infection.  What else can I do?  Schedule regular health, dental, and eye exams.  Stay current with your vaccines (immunizations).  Do not use any tobacco products, such as  cigarettes, chewing tobacco, and e-cigarettes. If you need help quitting, ask your health care provider.  Limit alcohol intake to no more than 2 drinks per day. One drink equals 12 ounces of beer, 5 ounces of wine, or 1 ounces of hard liquor.  Do not use street drugs.  Do not share needles.  Ask your health care provider for help if you need support or information about quitting drugs.  Tell your health care provider if you often feel depressed.  Tell your health care provider if you have ever been abused or do not feel safe at home. This information is not intended to replace advice given to you by your health care provider. Make sure you discuss any questions you have with your health care provider. Document Released: 04/04/2008 Document Revised: 06/05/2016 Document Reviewed: 07/11/2015 Elsevier Interactive Patient Education  [employer].

## 2018-06-17 NOTE — Assessment & Plan Note (Signed)
Chronic problem.  Pt does not use a daily controller medication but feels that there are times that would be helpful.  Prescription for Symbicort sent to pharmacy along w/ albuterol rescue inhaler.  Pt expressed understanding and is in agreement w/ plan.

## 2018-06-17 NOTE — Telephone Encounter (Signed)
Dr. Marlou Sa would like to seek approval for Bilateral Euflexxa injections. Thanks.

## 2018-06-17 NOTE — Patient Instructions (Signed)
Follow up in 6 months to recheck cholesterol We'll notify you of your lab results and make any changes if needed Continue to work on healthy diet and regular exercise- you can do it! We'll call you with your GI and Dermatology referrals We'll call you with your thyroid ultrasound appt I sent a daily inhaler (Symbicort) and a rescue inhaler (Albuterol) Call with any questions or concerns Happy Labor Day!
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
## 2018-06-17 NOTE — Assessment & Plan Note (Signed)
Pt's PE WNL w/ exception of obesity and mild thyromegaly.  GI referral placed.  Pt declined GU exam.  Check labs.  Anticipatory guidance provided.

## 2018-06-17 NOTE — Assessment & Plan Note (Signed)
Pt feels thyroid is enlarging.  Has not had imaging in 4 yrs.  Will repeat US and determine if things have changed.

## 2018-06-17 NOTE — Assessment & Plan Note (Signed)
Chronic problem.  Tolerating statin w/o difficulty.  Check labs.  Adjust meds prn  

## 2018-06-18 ENCOUNTER — Encounter: Payer: Self-pay | Admitting: General Practice

## 2018-06-23 NOTE — Telephone Encounter (Signed)
Noted  

## 2018-06-25 ENCOUNTER — Other Ambulatory Visit: Payer: Medicare HMO

## 2018-06-29 ENCOUNTER — Telehealth (INDEPENDENT_AMBULATORY_CARE_PROVIDER_SITE_OTHER): Payer: Self-pay

## 2018-06-29 NOTE — Telephone Encounter (Signed)
Submitted VOB for Euflexxa series, bilateral knee. °

## 2018-07-06 ENCOUNTER — Telehealth (INDEPENDENT_AMBULATORY_CARE_PROVIDER_SITE_OTHER): Payer: Self-pay

## 2018-07-06 NOTE — Telephone Encounter (Signed)
Initiated PA for Euflexxa, bilateral knee with [employer]. Per Altamese Homer. PA form will be faxed to our office 07/06/2018 and once completed PA form will take 14 calendar days to be reviewed.

## 2018-07-15 ENCOUNTER — Ambulatory Visit: Payer: Medicare HMO | Admitting: Vascular Surgery

## 2018-07-15 ENCOUNTER — Other Ambulatory Visit (HOSPITAL_COMMUNITY): Payer: Medicare HMO

## 2018-07-22 ENCOUNTER — Telehealth (INDEPENDENT_AMBULATORY_CARE_PROVIDER_SITE_OTHER): Payer: Self-pay

## 2018-07-22 NOTE — Telephone Encounter (Signed)
PA required for Euflexxa series, bilateral knee. PA form faxed to [facility] at [phone]

## 2018-07-28 ENCOUNTER — Telehealth (INDEPENDENT_AMBULATORY_CARE_PROVIDER_SITE_OTHER): Payer: Self-pay

## 2018-07-28 NOTE — Telephone Encounter (Signed)
Talked with [name] and advised him that he is approved for Euflexxa series, bilateral knee. Allen [name] will be responsible for 20% OOP for medication. $40.00 Co-pay each visit PA required PA Approval# [id number] Valid 07/22/2018- 10/22/2018  Appt. 08/12/2018

## 2018-07-29 DIAGNOSIS — R69 Illness, unspecified: Secondary | ICD-10-CM | POA: Diagnosis not present

## 2018-08-03 ENCOUNTER — Other Ambulatory Visit: Payer: Self-pay | Admitting: Family Medicine

## 2018-08-03 NOTE — Telephone Encounter (Signed)
Last rx Clonazepam 06/09/18 #30 1 RF Last OV: 06/17/18

## 2018-08-11 ENCOUNTER — Ambulatory Visit: Payer: Medicare HMO | Admitting: Vascular Surgery

## 2018-08-11 ENCOUNTER — Other Ambulatory Visit (HOSPITAL_COMMUNITY): Payer: Medicare HMO

## 2018-08-12 ENCOUNTER — Ambulatory Visit (INDEPENDENT_AMBULATORY_CARE_PROVIDER_SITE_OTHER): Payer: Medicare HMO | Admitting: Orthopedic Surgery

## 2018-08-12 ENCOUNTER — Encounter (INDEPENDENT_AMBULATORY_CARE_PROVIDER_SITE_OTHER): Payer: Self-pay | Admitting: Orthopedic Surgery

## 2018-08-12 VITALS — Ht 72.0 in | Wt 275.0 lb

## 2018-08-12 DIAGNOSIS — M25512 Pain in left shoulder: Secondary | ICD-10-CM

## 2018-08-12 DIAGNOSIS — M1712 Unilateral primary osteoarthritis, left knee: Secondary | ICD-10-CM

## 2018-08-12 DIAGNOSIS — M1711 Unilateral primary osteoarthritis, right knee: Secondary | ICD-10-CM | POA: Diagnosis not present

## 2018-08-12 MED ORDER — LIDOCAINE HCL 1 % IJ SOLN
5.0000 mL | INTRAMUSCULAR | Status: AC | PRN
  Administered 2018-08-12: 5 mL

## 2018-08-12 MED ORDER — SODIUM HYALURONATE (VISCOSUP) 20 MG/2ML IX SOSY
20.0000 mg | PREFILLED_SYRINGE | INTRA_ARTICULAR | Status: AC | PRN
  Administered 2018-08-12: 20 mg via INTRA_ARTICULAR

## 2018-08-12 NOTE — Progress Notes (Signed)
   Procedure Note  Patient: Allen [name]             Date of Birth: Jul 31, 1946           MRN: [id number]             Visit Date: 08/12/2018  Procedures: Visit Diagnoses: Unilateral primary osteoarthritis, left knee  Unilateral primary osteoarthritis, right knee  Large Joint Inj: bilateral knee on 08/12/2018 12:27 PM Indications: pain, joint swelling and diagnostic evaluation Details: 18 G 1.5 in needle, superolateral approach  Arthrogram: No  Medications (Right): 5 mL lidocaine 1 %; 20 mg Sodium Hyaluronate 20 MG/2ML Medications (Left): 5 mL lidocaine 1 %; 20 mg Sodium Hyaluronate 20 MG/2ML Outcome: tolerated well, no immediate complications Procedure, treatment alternatives, risks and benefits explained, specific risks discussed. Consent was given by the patient. Immediately prior to procedure a time out was called to verify the correct patient, procedure, equipment, support staff and site/side marked as required. Patient was prepped and draped in the usual sterile fashion.    Patient also has left shoulder arthritis.  Examined that today and he does have some restricted range of motion.  Radiographs do show arthritis in the shoulder joint.  I would like him to see Dr. Ernestina Patches for injection into that left shoulder glenohumeral joint.

## 2018-08-12 NOTE — Addendum Note (Signed)
Addended byLaurann Montana on: 08/12/2018 01:13 PM   Modules accepted: Orders

## 2018-08-19 ENCOUNTER — Encounter: Payer: Self-pay | Admitting: Family Medicine

## 2018-08-24 ENCOUNTER — Ambulatory Visit (INDEPENDENT_AMBULATORY_CARE_PROVIDER_SITE_OTHER): Payer: Self-pay

## 2018-08-24 ENCOUNTER — Encounter (INDEPENDENT_AMBULATORY_CARE_PROVIDER_SITE_OTHER): Payer: Self-pay | Admitting: Physical Medicine and Rehabilitation

## 2018-08-24 ENCOUNTER — Ambulatory Visit (INDEPENDENT_AMBULATORY_CARE_PROVIDER_SITE_OTHER): Payer: Medicare HMO | Admitting: Physical Medicine and Rehabilitation

## 2018-08-24 DIAGNOSIS — M25512 Pain in left shoulder: Secondary | ICD-10-CM | POA: Diagnosis not present

## 2018-08-24 DIAGNOSIS — G8929 Other chronic pain: Secondary | ICD-10-CM | POA: Diagnosis not present

## 2018-08-24 MED ORDER — BUPIVACAINE HCL 0.5 % IJ SOLN
3.0000 mL | INTRAMUSCULAR | Status: AC | PRN
  Administered 2018-08-24: 3 mL via INTRA_ARTICULAR

## 2018-08-24 MED ORDER — TRIAMCINOLONE ACETONIDE 40 MG/ML IJ SUSP
80.0000 mg | INTRAMUSCULAR | Status: AC | PRN
  Administered 2018-08-24: 80 mg via INTRA_ARTICULAR
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
## 2018-08-24 NOTE — Progress Notes (Signed)
   Numeric Pain Rating Scale and Functional Assessment Average Pain 8   In the last MONTH (on 0-10 scale) has pain interfered with the following?  1. General activity like being  able to carry out your everyday physical activities such as walking, climbing stairs, carrying groceries, or moving a chair?  Rating(4)   -Dye Allergies.  

## 2018-08-24 NOTE — Patient Instructions (Signed)

## 2018-08-26 DIAGNOSIS — D1801 Hemangioma of skin and subcutaneous tissue: Secondary | ICD-10-CM | POA: Diagnosis not present

## 2018-08-26 DIAGNOSIS — D229 Melanocytic nevi, unspecified: Secondary | ICD-10-CM | POA: Diagnosis not present

## 2018-08-26 DIAGNOSIS — L821 Other seborrheic keratosis: Secondary | ICD-10-CM | POA: Diagnosis not present

## 2018-08-26 DIAGNOSIS — L570 Actinic keratosis: Secondary | ICD-10-CM | POA: Diagnosis not present

## 2018-08-30 ENCOUNTER — Other Ambulatory Visit: Payer: Self-pay | Admitting: Family Medicine

## 2018-09-01 DIAGNOSIS — Z8249 Family history of ischemic heart disease and other diseases of the circulatory system: Secondary | ICD-10-CM | POA: Diagnosis not present

## 2018-09-01 DIAGNOSIS — E785 Hyperlipidemia, unspecified: Secondary | ICD-10-CM | POA: Diagnosis not present

## 2018-09-01 DIAGNOSIS — J449 Chronic obstructive pulmonary disease, unspecified: Secondary | ICD-10-CM | POA: Diagnosis not present

## 2018-09-01 DIAGNOSIS — Z809 Family history of malignant neoplasm, unspecified: Secondary | ICD-10-CM | POA: Diagnosis not present

## 2018-09-01 DIAGNOSIS — Z6838 Body mass index (BMI) 38.0-38.9, adult: Secondary | ICD-10-CM | POA: Diagnosis not present

## 2018-09-01 DIAGNOSIS — Z7951 Long term (current) use of inhaled steroids: Secondary | ICD-10-CM | POA: Diagnosis not present

## 2018-09-01 DIAGNOSIS — Z7722 Contact with and (suspected) exposure to environmental tobacco smoke (acute) (chronic): Secondary | ICD-10-CM | POA: Diagnosis not present

## 2018-09-01 DIAGNOSIS — E669 Obesity, unspecified: Secondary | ICD-10-CM | POA: Diagnosis not present

## 2018-09-01 DIAGNOSIS — R69 Illness, unspecified: Secondary | ICD-10-CM | POA: Diagnosis not present

## 2018-09-01 DIAGNOSIS — I10 Essential (primary) hypertension: Secondary | ICD-10-CM | POA: Diagnosis not present

## 2018-09-02 ENCOUNTER — Telehealth (INDEPENDENT_AMBULATORY_CARE_PROVIDER_SITE_OTHER): Payer: Self-pay

## 2018-09-02 NOTE — Telephone Encounter (Signed)
Talked with patient and scheduled an appointment for Euflexxa series #2, bilateral knee.

## 2018-09-14 ENCOUNTER — Ambulatory Visit (INDEPENDENT_AMBULATORY_CARE_PROVIDER_SITE_OTHER): Payer: Medicare HMO | Admitting: Orthopedic Surgery

## 2018-09-23 ENCOUNTER — Ambulatory Visit (INDEPENDENT_AMBULATORY_CARE_PROVIDER_SITE_OTHER): Payer: Medicare HMO | Admitting: Orthopedic Surgery

## 2018-09-23 ENCOUNTER — Encounter (INDEPENDENT_AMBULATORY_CARE_PROVIDER_SITE_OTHER): Payer: Self-pay | Admitting: Orthopedic Surgery

## 2018-09-23 DIAGNOSIS — M1711 Unilateral primary osteoarthritis, right knee: Secondary | ICD-10-CM

## 2018-09-23 DIAGNOSIS — M1712 Unilateral primary osteoarthritis, left knee: Secondary | ICD-10-CM | POA: Diagnosis not present

## 2018-09-23 MED ORDER — SODIUM HYALURONATE (VISCOSUP) 20 MG/2ML IX SOSY
20.0000 mg | PREFILLED_SYRINGE | INTRA_ARTICULAR | Status: AC | PRN
  Administered 2018-09-23: 20 mg via INTRA_ARTICULAR

## 2018-09-23 MED ORDER — LIDOCAINE HCL 1 % IJ SOLN
5.0000 mL | INTRAMUSCULAR | Status: AC | PRN
  Administered 2018-09-23: 5 mL

## 2018-09-23 NOTE — Progress Notes (Signed)
   Procedure Note  Patient: Allen [name]             Date of Birth: 07-May-1946           MRN: [id number]             Visit Date: 09/23/2018  Procedures: Visit Diagnoses: Unilateral primary osteoarthritis, right knee  Unilateral primary osteoarthritis, left knee  Large Joint Inj: bilateral knee on 09/23/2018 3:27 PM Indications: pain, joint swelling and diagnostic evaluation Details: 18 G 1.5 in needle, superolateral approach  Arthrogram: No  Medications (Right): 5 mL lidocaine 1 %; 20 mg Sodium Hyaluronate 20 MG/2ML Medications (Left): 5 mL lidocaine 1 %; 20 mg Sodium Hyaluronate 20 MG/2ML Outcome: tolerated well, no immediate complications Procedure, treatment alternatives, risks and benefits explained, specific risks discussed. Consent was given by the patient. Immediately prior to procedure a time out was called to verify the correct patient, procedure, equipment, support staff and site/side marked as required. Patient was prepped and draped in the usual sterile fashion.

## 2018-10-05 ENCOUNTER — Ambulatory Visit (INDEPENDENT_AMBULATORY_CARE_PROVIDER_SITE_OTHER): Payer: Medicare HMO | Admitting: Orthopedic Surgery

## 2018-10-09 ENCOUNTER — Other Ambulatory Visit (HOSPITAL_COMMUNITY): Payer: Medicare HMO

## 2018-10-09 ENCOUNTER — Ambulatory Visit: Payer: Medicare HMO | Admitting: Vascular Surgery

## 2018-10-11 ENCOUNTER — Other Ambulatory Visit: Payer: Self-pay | Admitting: Family Medicine

## 2018-10-22 ENCOUNTER — Ambulatory Visit: Payer: Medicare HMO | Admitting: Family Medicine

## 2018-10-27 ENCOUNTER — Other Ambulatory Visit: Payer: Self-pay | Admitting: Family Medicine

## 2018-10-27 NOTE — Telephone Encounter (Signed)
Last OV 06/17/18 Clonazepam last filled 08/03/18 #30 with 1

## 2018-11-26 ENCOUNTER — Encounter: Payer: Self-pay | Admitting: Family Medicine

## 2018-11-26 ENCOUNTER — Ambulatory Visit: Payer: Self-pay | Admitting: Family Medicine

## 2018-11-26 ENCOUNTER — Ambulatory Visit (INDEPENDENT_AMBULATORY_CARE_PROVIDER_SITE_OTHER): Payer: Medicare HMO | Admitting: Family Medicine

## 2018-11-26 ENCOUNTER — Other Ambulatory Visit: Payer: Self-pay

## 2018-11-26 VITALS — BP 123/86 | HR 63 | Temp 98.8°F | Resp 16 | Ht 72.0 in | Wt 283.5 lb

## 2018-11-26 DIAGNOSIS — M7989 Other specified soft tissue disorders: Secondary | ICD-10-CM | POA: Diagnosis not present

## 2018-11-26 DIAGNOSIS — R351 Nocturia: Secondary | ICD-10-CM | POA: Diagnosis not present

## 2018-11-26 LAB — CBC WITH DIFFERENTIAL/PLATELET
BASOS ABS: 0 10*3/uL (ref 0.0–0.1)
Basophils Relative: 0.7 % (ref 0.0–3.0)
Eosinophils Absolute: 0.2 10*3/uL (ref 0.0–0.7)
Eosinophils Relative: 2.6 % (ref 0.0–5.0)
HCT: 42.9 % (ref 39.0–52.0)
Hemoglobin: 14.2 g/dL (ref 13.0–17.0)
LYMPHS PCT: 27.7 % (ref 12.0–46.0)
Lymphs Abs: 1.7 10*3/uL (ref 0.7–4.0)
MCHC: 33 g/dL (ref 30.0–36.0)
MCV: 86.7 fl (ref 78.0–100.0)
Monocytes Absolute: 0.4 10*3/uL (ref 0.1–1.0)
Monocytes Relative: 7.1 % (ref 3.0–12.0)
NEUTROS PCT: 61.9 % (ref 43.0–77.0)
Neutro Abs: 3.9 10*3/uL (ref 1.4–7.7)
Platelets: 221 10*3/uL (ref 150.0–400.0)
RBC: 4.95 Mil/uL (ref 4.22–5.81)
RDW: 14.7 % (ref 11.5–15.5)
WBC: 6.3 10*3/uL (ref 4.0–10.5)

## 2018-11-26 LAB — BASIC METABOLIC PANEL
BUN: 27 mg/dL — ABNORMAL HIGH (ref 6–23)
CO2: 26 mEq/L (ref 19–32)
Calcium: 9.4 mg/dL (ref 8.4–10.5)
Chloride: 104 mEq/L (ref 96–112)
Creatinine, Ser: 0.97 mg/dL (ref 0.40–1.50)
GFR: 76.47 mL/min (ref >60.00)
Glucose, Bld: 100 mg/dL — ABNORMAL HIGH (ref 70–99)
Potassium: 4.3 mEq/L (ref 3.5–5.1)
Sodium: 139 mEq/L (ref 135–145)

## 2018-11-26 LAB — HEPATIC FUNCTION PANEL
ALBUMIN: 4.5 g/dL (ref 3.5–5.2)
ALT: 17 U/L (ref 0–53)
AST: 14 U/L (ref 0–37)
Alkaline Phosphatase: 63 U/L (ref 39–117)
Bilirubin, Direct: 0.1 mg/dL (ref 0.0–0.3)
Total Bilirubin: 0.4 mg/dL (ref 0.2–1.2)
Total Protein: 7.3 g/dL (ref 6.0–8.3)

## 2018-11-26 LAB — TSH: TSH: 1.83 u[IU]/mL (ref 0.35–4.50)

## 2018-11-26 MED ORDER — FUROSEMIDE 20 MG PO TABS: 20.0000 mg | ORAL_TABLET | Freq: Every day | ORAL | 3 refills | Status: DC

## 2018-11-26 NOTE — Progress Notes (Signed)
   Subjective:    Patient ID: Allen [name], male    DOB: 12-19-1945, 73 y.o.   MRN: [id number]  HPI Leg swelling- 'i've been retaining a whole bunch of water'.  Lower legs will turn red.  Pt reports swelling has been 'quite some time', guessing '6-8 months'.  + swelling of hands- 'they feel tight all the time'.  Denies increased SOB (has baseline COPD).  Denies CP.  Denies excessive salt intake- has stopped salting food.  Pt has gained 9 lbs since last visit.  BMI is now 38.45  Polyuria- pt is waking up 3-4x/night and voiding constantly during the day.  If he has the urge and doesn't go immediately, will have accidents.  sxs started ~6 months ago.  He feels as if he's not emptying bladder completely.   Review of Systems For ROS see HPI     Objective:   Physical Exam Vitals signs reviewed.  Constitutional:      General: He is not in acute distress.    Appearance: He is well-developed. He is obese.  HENT:     Head: Normocephalic and atraumatic.  Eyes:     Conjunctiva/sclera: Conjunctivae normal.     Pupils: Pupils are equal, round, and reactive to light.  Neck:     Musculoskeletal: Normal range of motion and neck supple.     Thyroid: No thyromegaly.  Cardiovascular:     Rate and Rhythm: Normal rate and regular rhythm.     Heart sounds: Normal heart sounds. No murmur.  Pulmonary:     Effort: Pulmonary effort is normal. No respiratory distress.     Breath sounds: Normal breath sounds.  Abdominal:     General: Bowel sounds are normal. There is no distension.     Palpations: Abdomen is soft.  Musculoskeletal:     Right lower leg: Edema (1+ pitting edema) present.     Left lower leg: Edema (1+ pitting edema) present.  Lymphadenopathy:     Cervical: No cervical adenopathy.  Skin:    General: Skin is warm and dry.  Neurological:     Mental Status: He is alert and oriented to person, place, and time.     Cranial Nerves: No cranial nerve deficit.  Psychiatric:        Behavior:  Behavior normal.           Assessment & Plan:  LE edema- new to provider, ongoing for pt.  Legs are taut, shiny, and will get red at times.  This is suspicious for venous stasis given body habitus but since he does have hx of CAD will get ECHO to assess for CHF.  Last EF 55% in 2018.  Stressed need for low Na diet, increased water intake.  Will start Lasix and follow closely.  Pt expressed understanding and is in agreement w/ plan.

## 2018-11-26 NOTE — Telephone Encounter (Signed)
Pt. Reports he started having hand and leg swelling 1 month ago. States he can put shoes on. Swelling goes up to below the knee. Denies any shortness of breath.States 2 days ago had sharp pain in his chest that lasted 15 seconds. No pain today. No other symptoms. Appointment made for today.    Reason for Disposition . [1] MODERATE leg swelling (e.g., swelling extends up to knees) AND [2] new onset or worsening  Answer Assessment - Initial Assessment Questions 1. ONSET: "When did the swelling start?" (e.g., minutes, hours, days)     1 month ago 2. LOCATION: "What part of the leg is swollen?"  "Are both legs swollen or just one leg?"     Feet and below the knee 3. SEVERITY: "How bad is the swelling?" (e.g., localized; mild, moderate, severe)  - Localized - small area of swelling localized to one leg  - MILD pedal edema - swelling limited to foot and ankle, pitting edema < 1/4 inch (6 mm) deep, rest and elevation eliminate most or all swelling  - MODERATE edema - swelling of lower leg to knee, pitting edema > 1/4 inch (6 mm) deep, rest and elevation only partially reduce swelling  - SEVERE edema - swelling extends above knee, facial or hand swelling present      Moderate 4. REDNESS: "Does the swelling look red or infected?"     Pink 5. PAIN: "Is the swelling painful to touch?" If so, ask: "How painful is it?"   (Scale 1-10; mild, moderate or severe)     No 6. FEVER: "Do you have a fever?" If so, ask: "What is it, how was it measured, and when did it start?"      No 7. CAUSE: "What do you think is causing the leg swelling?"     Unsure 8. MEDICAL HISTORY: "Do you have a history of heart failure, kidney disease, liver failure, or cancer?"     Family history of heart issues  9. RECURRENT SYMPTOM: "Have you had leg swelling before?" If so, ask: "When was the last time?" "What happened that time?"     Yes 10. OTHER SYMPTOMS: "Do you have any other symptoms?" (e.g., chest pain, difficulty  breathing)       2 days ago had a sharp in his chest that lasted 15 seconds 11. PREGNANCY: "Is there any chance you are pregnant?" "When was your last menstrual period?"       No  Protocols used: LEG SWELLING AND EDEMA-A-AH

## 2018-11-26 NOTE — Assessment & Plan Note (Signed)
Deteriorated.  Pt has gained another 9 lbs.  Stressed need for healthy diet and regular exercise.  Will continue to follow.

## 2018-11-26 NOTE — Patient Instructions (Signed)
Follow up in 2 weeks to recheck swelling We'll notify you of your lab results and make any changes if needed We'll call you with your ECHO appt START the Furosemide once daily to improve swelling LIMIT your salt intake INCREASE your water intake- which sounds backwards, but it will help We'll call you with your Urology appt for the urinary frequency Call with any questions or concerns Hang in there!

## 2018-11-26 NOTE — Assessment & Plan Note (Signed)
New to provider, ongoing for pt.  He is having nocturia at night and urinary frequency and urgency during the day.  Suspect this is either prostate related or due to body habitus.  Refer to Urology for complete evaluation.

## 2018-11-27 ENCOUNTER — Other Ambulatory Visit (HOSPITAL_COMMUNITY): Payer: Medicare HMO

## 2018-12-02 ENCOUNTER — Ambulatory Visit (HOSPITAL_COMMUNITY): Payer: Medicare HMO | Attending: Cardiology

## 2018-12-02 DIAGNOSIS — M7989 Other specified soft tissue disorders: Secondary | ICD-10-CM | POA: Insufficient documentation

## 2018-12-02 LAB — ECHOCARDIOGRAM COMPLETE

## 2018-12-02 MED ORDER — PERFLUTREN LIPID MICROSPHERE
1.0000 mL | INTRAVENOUS | Status: AC | PRN
  Administered 2018-12-02: 2 mL via INTRAVENOUS

## 2018-12-04 ENCOUNTER — Ambulatory Visit (HOSPITAL_COMMUNITY)
Admission: RE | Admit: 2018-12-04 | Discharge: 2018-12-04 | Disposition: A | Discharge status: Home / Self Care | Payer: Medicare HMO | Source: Ambulatory Visit | Attending: Vascular Surgery | Admitting: Vascular Surgery

## 2018-12-04 ENCOUNTER — Encounter: Payer: Self-pay | Admitting: Vascular Surgery

## 2018-12-04 ENCOUNTER — Other Ambulatory Visit: Payer: Self-pay

## 2018-12-04 ENCOUNTER — Ambulatory Visit (INDEPENDENT_AMBULATORY_CARE_PROVIDER_SITE_OTHER): Payer: Medicare HMO | Admitting: Vascular Surgery

## 2018-12-04 VITALS — BP 125/78 | HR 70 | Temp 97.6°F | Resp 18 | Ht 71.0 in | Wt 282.0 lb

## 2018-12-04 DIAGNOSIS — I771 Stricture of artery: Secondary | ICD-10-CM
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
## 2018-12-08 ENCOUNTER — Other Ambulatory Visit: Payer: Self-pay | Admitting: General Practice

## 2018-12-08 MED ORDER — BUPROPION HCL ER (XL) 150 MG PO TB24: 150.0000 mg | ORAL_TABLET | Freq: Every day | ORAL | 1 refills | Status: DC

## 2018-12-28 ENCOUNTER — Ambulatory Visit: Payer: Self-pay | Admitting: *Deleted

## 2018-12-28 ENCOUNTER — Other Ambulatory Visit: Payer: Self-pay | Admitting: Family Medicine

## 2018-12-28 NOTE — Telephone Encounter (Signed)
Message from Bea Graff, NT sent at 12/28/2018 4:12 PM EDT   Summary: tamiflu request    Pt states that his daughter was just diagnosed with the flu and pt would like the see if preventative tamiflu can be called in?          Returned call to pt who states that his daughter was diagnosed with the flu on today. Pt states he took his daughter to the doctor's appt. Pt states he experienced cold chills last night but is not currently experiencing any symptoms. Pt does have a history of COPD. Pt asking if prescription for Tamiflu could be sent to [facility] in [place].  Reason for Disposition . [1] Influenza EXPOSURE  (Close Contact) within last 7 days AND [2] exposed person is HIGH RISK (e.g., age > 55 years, pregnant, HIV+, chronic medical condition) AND [3] NO respiratory symptoms  Answer Assessment - Initial Assessment Questions 1. TYPE of EXPOSURE: "How were you exposed?" (e.g., close contact, not a close contact)     Pt's daughter was diagnosed with the flu on today 2. DATE of EXPOSURE: "When did the exposure occur?" (e.g., hour, days, weeks)     Took her to the doctor's office today 4. HIGH RISK for COMPLICATIONS: "Do you have any heart or lung problems? Do you have a weakened immune system?" (e.g., CHF, COPD, asthma, HIV positive, chemotherapy, renal failure, diabetes mellitus, sickle cell anemia)     COPD 5. SYMPTOMS: "Do you have any symptoms?" (e.g., cough, fever, sore throat, difficulty breathing).     Cold chills last night but none currently and states she does not have other symptoms  Protocols used: INFLUENZA EXPOSURE-A-AH

## 2018-12-29 ENCOUNTER — Other Ambulatory Visit: Payer: Self-pay | Admitting: General Practice

## 2018-12-29 MED ORDER — OSELTAMIVIR PHOSPHATE 75 MG PO CAPS: 75.0000 mg | ORAL_CAPSULE | Freq: Every day | ORAL | 0 refills | Status: DC

## 2018-12-29 NOTE — Telephone Encounter (Signed)
[facility] for Tamiflu 75mg  1 tab daily x10 days

## 2018-12-29 NOTE — Telephone Encounter (Signed)
Medication filled to pharmacy as requested.  Pt made aware.  

## 2018-12-29 NOTE — Telephone Encounter (Signed)
Please advise 

## 2019-01-17 ENCOUNTER — Other Ambulatory Visit: Payer: Self-pay | Admitting: Family Medicine

## 2019-01-18 NOTE — Telephone Encounter (Signed)
Last refill:10/27/18 #30, 1 Last OV:11/26/18

## 2019-03-25 ENCOUNTER — Ambulatory Visit: Payer: Self-pay | Admitting: *Deleted

## 2019-03-25 DIAGNOSIS — M1990 Unspecified osteoarthritis, unspecified site: Secondary | ICD-10-CM | POA: Diagnosis not present

## 2019-03-25 DIAGNOSIS — I509 Heart failure, unspecified: Secondary | ICD-10-CM | POA: Diagnosis not present

## 2019-03-25 DIAGNOSIS — Z7951 Long term (current) use of inhaled steroids: Secondary | ICD-10-CM | POA: Diagnosis not present

## 2019-03-25 DIAGNOSIS — I87309 Chronic venous hypertension (idiopathic) without complications of unspecified lower extremity: Secondary | ICD-10-CM | POA: Diagnosis not present

## 2019-03-25 DIAGNOSIS — I110 Hypertensive heart disease with heart failure: Secondary | ICD-10-CM | POA: Diagnosis not present

## 2019-03-25 DIAGNOSIS — J439 Emphysema, unspecified: Secondary | ICD-10-CM | POA: Diagnosis not present

## 2019-03-25 DIAGNOSIS — K219 Gastro-esophageal reflux disease without esophagitis: Secondary | ICD-10-CM | POA: Diagnosis not present

## 2019-03-25 DIAGNOSIS — E785 Hyperlipidemia, unspecified: Secondary | ICD-10-CM | POA: Diagnosis not present

## 2019-03-25 DIAGNOSIS — R69 Illness, unspecified: Secondary | ICD-10-CM | POA: Diagnosis not present

## 2019-03-25 NOTE — Telephone Encounter (Signed)
Can we schedule pt in office visit if he passes screening and is willing?

## 2019-03-25 NOTE — Telephone Encounter (Signed)
Pt called with complaints of bilateral ankle and foot swelling L>R; he says that he had a wellness visit with his insurance company; the left ankle is seeping, both his ankles have been swollen 2 months, but have worsened over the past 2 weeks; he would like to have his lasix increased; he reports that he has gained 5-8 pounds (281 lbs up from 274 lbs); recommendations made per nurse triage protocol; the pt verbalizes understanding, but he prefers not to come into the office for visit; spoke with Jilda Roche at office, and she requests that this be routed to the office so that Dr Birdie Riddle can decide which type appointment is appropriate for pt; the pt can be contacted at [phone]; will route per request.  Reason for Disposition . [1] MILD swelling of both ankles (i.e., pedal edema) AND [2] new onset or worsening  Answer Assessment - Initial Assessment Questions 1. ONSET: "When did the swelling start?" (e.g., minutes, hours, days)     2 months ago 2. LOCATION: "What part of the leg is swollen?"  "Are both legs swollen or just one leg?"   Both legs; ankles and feet 3. SEVERITY: "How bad is the swelling?" (e.g., localized; mild, moderate, severe)  - Localized - small area of swelling localized to one leg  - MILD pedal edema - swelling limited to foot and ankle, pitting edema < 1/4 inch (6 mm) deep, rest and elevation eliminate most or all swelling  - MODERATE edema - swelling of lower leg to knee, pitting edema > 1/4 inch (6 mm) deep, rest and elevation only partially reduce swelling  - SEVERE edema - swelling extends above knee, facial or hand swelling present       moderate 4. REDNESS: "Does the swelling look red or infected?"     no 5. PAIN: "Is the swelling painful to touch?" If so, ask: "How painful is it?"   (Scale 1-10; mild, moderate or severe)    Feels like a bee sting; rated 4 out of 10 6. FEVER: "Do you have a fever?" If so, ask: "What is it, how was it measured, and when did it start?"       no 7. CAUSE: "What do you think is causing the leg swelling?"     Not sure; not good circulation 8. MEDICAL HISTORY: "Do you have a history of heart failure, kidney disease, liver failure, or cancer?"     hypertension 9. RECURRENT SYMPTOM: "Have you had leg swelling before?" If so, ask: "When was the last time?" "What happened that time?"     no 10. OTHER SYMPTOMS: "Do you have any other symptoms?" (e.g., chest pain, difficulty breathing)       no 11. PREGNANCY: "Is there any chance you are pregnant?" "When was your last menstrual period?"      n/a  Protocols used: LEG SWELLING AND EDEMA-A-AH

## 2019-03-25 NOTE — Telephone Encounter (Signed)
Would you prefer VV or in office

## 2019-03-25 NOTE — Telephone Encounter (Signed)
If he is not willing to come to office, we will need to do a virtual visit (as long as I can see the legs).  I prefer in-office so we can assess and get labs but I understand his concerns

## 2019-03-25 NOTE — Telephone Encounter (Signed)
Pt has been scheduled.  °

## 2019-03-26 ENCOUNTER — Ambulatory Visit: Payer: Medicare HMO | Admitting: Family Medicine

## 2019-03-26 ENCOUNTER — Other Ambulatory Visit: Payer: Self-pay

## 2019-03-26 ENCOUNTER — Encounter: Payer: Self-pay | Admitting: Family Medicine

## 2019-03-26 DIAGNOSIS — I5189 Other ill-defined heart diseases: Secondary | ICD-10-CM | POA: Diagnosis not present

## 2019-03-26 DIAGNOSIS — R600 Localized edema: Secondary | ICD-10-CM | POA: Insufficient documentation

## 2019-03-26 LAB — CBC WITH DIFFERENTIAL/PLATELET
Basophils Absolute: 0.1 10*3/uL (ref 0.0–0.1)
Basophils Relative: 0.9 % (ref 0.0–3.0)
Eosinophils Absolute: 0.2 10*3/uL (ref 0.0–0.7)
Eosinophils Relative: 3 % (ref 0.0–5.0)
HCT: 43.3 % (ref 39.0–52.0)
Hemoglobin: 14.6 g/dL (ref 13.0–17.0)
Lymphocytes Relative: 28.6 % (ref 12.0–46.0)
Lymphs Abs: 1.9 10*3/uL (ref 0.7–4.0)
MCHC: 33.7 g/dL (ref 30.0–36.0)
MCV: 86.6 fl (ref 78.0–100.0)
Monocytes Absolute: 0.6 10*3/uL (ref 0.1–1.0)
Monocytes Relative: 8.6 % (ref 3.0–12.0)
Neutro Abs: 3.8 10*3/uL (ref 1.4–7.7)
Neutrophils Relative %: 58.9 % (ref 43.0–77.0)
Platelets: 260 10*3/uL (ref 150.0–400.0)
RBC: 4.99 Mil/uL (ref 4.22–5.81)
RDW: 15.6 % — ABNORMAL HIGH (ref 11.5–15.5)
WBC: 6.5 10*3/uL (ref 4.0–10.5)

## 2019-03-26 LAB — BASIC METABOLIC PANEL
BUN: 22 mg/dL (ref 6–23)
CO2: 25 mEq/L (ref 19–32)
Calcium: 9.2 mg/dL (ref 8.4–10.5)
Chloride: 101 mEq/L (ref 96–112)
Creatinine, Ser: 0.97 mg/dL (ref 0.40–1.50)
GFR: 76.4 mL/min (ref >60.00)
Glucose, Bld: 102 mg/dL — ABNORMAL HIGH (ref 70–99)
Potassium: 4 mEq/L (ref 3.5–5.1)
Sodium: 137 mEq/L (ref 135–145)

## 2019-03-26 MED ORDER — FUROSEMIDE 40 MG PO TABS: 40.0000 mg | ORAL_TABLET | Freq: Every day | ORAL | 3 refills | Status: DC

## 2019-03-26 NOTE — Assessment & Plan Note (Signed)
L leg > R.  Pt w/ 1+ edema of L leg today which he reports is typical for him.  ECHO done in Feb shows diastolic dysfxn.  Check labs, increase Lasix to 40mg  daily, refer back to Dr Gwenlyn Found for complete assessment.  Reviewed lifestyle changes that will also help- low Na diet, elevating legs, increased water intake.  Pt expressed understanding and is in agreement w/ plan.

## 2019-03-26 NOTE — Assessment & Plan Note (Signed)
Noted on ECHO done in Feb.  Pt has seen Dr Gwenlyn Found previously.  Asymptomatic w/ exception of LE edema.  Increase lasix to 40mg  daily.  Monitor for improvement.

## 2019-03-26 NOTE — Patient Instructions (Signed)
Follow up in 2-3 weeks to recheck swelling and repeat labs We'll notify you of your lab results and make any changes if needed INCREASE the Lasix to 40mg  daily (new prescription was sent) Limit your salt intake, increase your water intake, elevate your legs when sitting We'll call you with your Cardiology and Surgery appts Call with any questions or concerns Stay Safe!

## 2019-03-26 NOTE — Progress Notes (Signed)
   Subjective:    Patient ID: Allen [name], male    DOB: 11/21/1945, 73 y.o.   MRN: [id number]  HPI Edema- pt was seen for this is February.  At the time was found to have diastolic dysfxn.  Pt had wellness visit w/ [employer] who noticed swelling of legs.  Pt reports if he bumps the legs he bleeds.  Pt 'every now and then' has 'oozing' from L leg.  Denies pain in legs- notes they get 'tight'.  Pt reports the way legs look today is pretty consistent w/ how they look all the time.  Taking Lasix daily.  No CP, SOB above baseline.  No swelling of hands.  Pt will elevate legs while sitting.  Obesity- pt reports he can lose weight.  'I can go on Atkins and lose 40lbs easy.  But it all comes back when I start eating again'.  Is interested in Bariatric Surgery.   Review of Systems For ROS see HPI     Objective:   Physical Exam Vitals signs reviewed.  Constitutional:      General: He is not in acute distress.    Appearance: He is well-developed. He is obese.  HENT:     Head: Normocephalic and atraumatic.  Eyes:     Conjunctiva/sclera: Conjunctivae normal.     Pupils: Pupils are equal, round, and reactive to light.  Neck:     Musculoskeletal: Normal range of motion and neck supple.     Thyroid: No thyromegaly.  Cardiovascular:     Rate and Rhythm: Normal rate and regular rhythm.     Heart sounds: Normal heart sounds. No murmur.  Pulmonary:     Effort: Pulmonary effort is normal. No respiratory distress.     Breath sounds: Normal breath sounds.  Abdominal:     General: Bowel sounds are normal. There is no distension.     Palpations: Abdomen is soft.  Musculoskeletal:     Right lower leg: No edema.     Left lower leg: Edema (1+ pitting edema of L leg) present.  Lymphadenopathy:     Cervical: No cervical adenopathy.  Skin:    General: Skin is warm and dry.  Neurological:     Mental Status: He is alert and oriented to person, place, and time.     Cranial Nerves: No cranial nerve  deficit.  Psychiatric:        Behavior: Behavior normal.           Assessment & Plan:

## 2019-03-26 NOTE — Assessment & Plan Note (Signed)
Ongoing issue for pt.  This is contributing to multiple medical issues- diastolic dysfxn, leg swelling, COPD, etc.  He is interested in discussing bariatric surgery to improve multiple things.  Referral placed.

## 2019-03-30 ENCOUNTER — Encounter: Payer: Self-pay | Admitting: General Practice

## 2019-04-26 ENCOUNTER — Other Ambulatory Visit: Payer: Self-pay | Admitting: Family Medicine

## 2019-04-27 NOTE — Telephone Encounter (Signed)
Last OV 03/26/19 Clonazepam last filled 01/18/19 #30 with 1

## 2019-05-07 ENCOUNTER — Ambulatory Visit: Payer: Medicare HMO | Admitting: Cardiovascular Disease

## 2019-05-13 ENCOUNTER — Telehealth: Payer: Self-pay | Admitting: Cardiology

## 2019-05-13 NOTE — Telephone Encounter (Signed)

## 2019-05-14 ENCOUNTER — Ambulatory Visit: Payer: Medicare HMO | Admitting: Cardiology

## 2019-06-01 ENCOUNTER — Ambulatory Visit: Payer: Medicare HMO | Admitting: Cardiovascular Disease

## 2019-06-19 ENCOUNTER — Other Ambulatory Visit: Payer: Self-pay | Admitting: Family Medicine

## 2019-06-23 ENCOUNTER — Other Ambulatory Visit: Payer: Self-pay | Admitting: Family Medicine

## 2019-06-24 ENCOUNTER — Other Ambulatory Visit: Payer: Self-pay | Admitting: Family Medicine

## 2019-06-26 ENCOUNTER — Other Ambulatory Visit: Payer: Self-pay | Admitting: Family Medicine

## 2019-07-14 ENCOUNTER — Ambulatory Visit: Payer: Medicare HMO | Admitting: Physician Assistant

## 2019-07-15 ENCOUNTER — Other Ambulatory Visit: Payer: Self-pay | Admitting: Family Medicine

## 2019-07-15 NOTE — Telephone Encounter (Signed)
Last refill: 7.8.20 #30,1 Last OV: 6.5.20 dx. Morbid obesity

## 2019-09-06 DIAGNOSIS — R69 Illness, unspecified: Secondary | ICD-10-CM | POA: Diagnosis not present

## 2019-10-19 ENCOUNTER — Other Ambulatory Visit: Payer: Self-pay | Admitting: Family Medicine

## 2019-10-19 NOTE — Telephone Encounter (Signed)
Last OV 03/26/19 Clonazepam last filled 07/16/19 #30 with 1

## 2019-11-09 ENCOUNTER — Ambulatory Visit (INDEPENDENT_AMBULATORY_CARE_PROVIDER_SITE_OTHER): Payer: Medicare HMO | Admitting: Physician Assistant

## 2019-11-09 ENCOUNTER — Other Ambulatory Visit: Payer: Self-pay

## 2019-11-09 ENCOUNTER — Encounter: Payer: Self-pay | Admitting: Physician Assistant

## 2019-11-09 VITALS — Temp 98.2°F | Wt 286.0 lb

## 2019-11-09 DIAGNOSIS — M545 Low back pain, unspecified: Secondary | ICD-10-CM

## 2019-11-09 NOTE — Progress Notes (Signed)
Virtual Visit via Video   I connected with patient on 11/09/19 at 11:00 AM EST by a video enabled telemedicine application and verified that I am speaking with the correct person using two identifiers.  Location patient: Home Location provider: [facility], Office Persons participating in the virtual visit: Patient, Provider, Colwyn Denita Lung)  I discussed the limitations of evaluation and management by telemedicine and the availability of in person appointments. The patient expressed understanding and agreed to proceed.  Subjective:   HPI:   Patient presents via doxy.need today complaining of 2 weeks of left-sided lower back pain.  Pain began after he had to lift his mother-in-law, catching her as she fell.  Patient notes pain is, aching and pulling in nature.  Worse with movement and bending.  Alleviated with rest.  Denies any radiation of pain.  Denies numbness, tingling or bruising at site of pain.  Denies any involvement of lower extremities.  Has taken Tylenol to help with pain as he cannot take ibuprofen due to angioedema.  Has also been applying heating pad and notes pain resolves when heating pad is on the area.  ROS:   See pertinent positives and negatives per HPI.  Patient Active Problem List   Diagnosis Date Noted  . Lower extremity edema 03/26/2019  . Diastolic dysfunction [id number]  . Nocturia more than twice per night 11/26/2018  . Subclavian artery stenosis, left ([place]) 01/12/2018  . Morbid obesity ([place]) 02/19/2016  . Left shoulder pain 02/19/2016  . Anxiety state 02/19/2016  . Chronic neck pain 09/17/2015  . Gastric mass - submucsaol - antrum 03/03/2015  . COPD (chronic obstructive pulmonary disease) with emphysema ([place]) 02/07/2015  . Rotator cuff impingement syndrome of left shoulder 08/02/2014  . De Quervain's tenosynovitis, right 05/01/2014  . Thyromegaly 05/01/2014  . Intermediate coronary syndrome ([place]) 04/26/2014  . Knee sprain and strain  02/17/2013  . Health maintenance examination 08/08/2011  . Chest pain 08/08/2011  . Prostate cancer screening 08/08/2011  . DEPRESSION 12/27/2010  . Unspecified hearing loss 04/19/2010  . Hyperlipidemia 08/21/2007  . Essential hypertension 08/21/2007  . CORONARY ARTERY DISEASE 08/21/2007  . GERD 08/21/2007    Social History   Tobacco Use  . Smoking status: Former Smoker    Packs/day: 2.00    Years: 35.00    Pack years: 70.00    Types: Cigarettes    Quit date: 10/21/1997    Years since quitting: 22.0  . Smokeless tobacco: Never Used  . Tobacco comment: 2-3 ppd  Substance Use Topics  . Alcohol use: No    Alcohol/week: 0.0 standard drinks    Current Outpatient Medications:  .  albuterol (PROVENTIL HFA;VENTOLIN HFA) 108 (90 Base) MCG/ACT inhaler, Inhale 2 puffs into the lungs every 6 (six) hours as needed for wheezing or shortness of breath., Disp: 1 Inhaler, Rfl: 2 .  buPROPion (WELLBUTRIN XL) 150 MG 24 hr tablet, TAKE 1 TABLET BY MOUTH EVERY DAY, Disp: 90 tablet, Rfl: 1 .  clonazePAM (KLONOPIN) 0.5 MG tablet, TAKE 1 TABLET BY MOUTH TWICE A DAY AS NEEDED FOR ANXIETY, Disp: 30 tablet, Rfl: 1 .  furosemide (LASIX) 40 MG tablet, TAKE 1 TABLET BY MOUTH EVERY DAY, Disp: 90 tablet, Rfl: 1 .  nitroGLYCERIN (NITROSTAT) 0.4 MG SL tablet, Place 1 tablet (0.4 mg total) under the tongue every 5 (five) minutes as needed for chest pain., Disp: 50 tablet, Rfl: 3 .  olmesartan-hydrochlorothiazide (BENICAR HCT) 20-12.5 MG tablet, TAKE 1 TABLET BY MOUTH EVERY DAY, Disp: 90  tablet, Rfl: 0 .  oseltamivir (TAMIFLU) 75 MG capsule, Take 1 capsule (75 mg total) by mouth daily., Disp: 10 capsule, Rfl: 0 .  SYMBICORT 80-4.5 MCG/ACT inhaler, TAKE 2 PUFFS BY MOUTH TWICE A DAY, Disp: 10.2 Inhaler, Rfl: 6 .  atorvastatin (LIPITOR) 20 MG tablet, TAKE 1 TABLET BY MOUTH EVERY DAY (Patient not taking: Reported on 11/09/2019), Disp: 30 tablet, Rfl: 3  Allergies  Allergen Reactions  . Sulfa Antibiotics Hives  .  Advil [Ibuprofen] Swelling and Other (See Comments)    Lip swelling  . Penicillins Hives and Other (See Comments)    Has patient had a PCN reaction causing immediate rash, facial/tongue/throat swelling, SOB or lightheadedness with hypotension: no Has patient had a PCN reaction causing severe rash involving mucus membranes or skin necrosis: no Has patient had a PCN reaction that required hospitalization no - childhood reaction Has patient had a PCN reaction occurring within the last 10 years: no - childhood reaction If all of the above answers are "NO", then may proceed with Cephalosporin use.   . Sulfonamide Derivatives Hives    Objective:   There were no vitals taken for this visit.  Patient is well-developed, well-nourished in no acute distress.  Resting comfortably at home.  Head is normocephalic, atraumatic.  No labored breathing.  Speech is clear and coherent with logical content.  Patient is alert and oriented at baseline.   Assessment and Plan:   1. Acute left-sided low back pain without sciatica Muscular in etiology.  No evidence of nerve involvement.  Unable to take NSAIDs due to angioedema.  We will have him continue Tylenol and heating pad.  Will begin nighttime Flexeril over the next few days.  We will also send a Medrol Dosepak to calm down inflammation.  Gentle stretching encouraged.  Follow-up if symptoms not resolving.    Leeanne Rio, PA-C 11/09/2019

## 2019-11-09 NOTE — Progress Notes (Signed)
I have discussed the procedure for the virtual visit with the patient who has given consent to proceed with assessment and treatment.   Tessla Spurling N Jere Vanburen, CMA      

## 2019-11-09 NOTE — Patient Instructions (Signed)
Instructions sent to MyChart.  Please avoid heavy lifting and overexertion. Okay to continue Tylenol and heating pad. Take the Flexeril at night to help relax muscles. Take the Medrol pack as directed to calm inflammation. Let me know if symptoms are not resolving, anything worsens, or new symptoms develop.  Hang in there!

## 2019-11-18 ENCOUNTER — Ambulatory Visit: Payer: Medicare HMO

## 2019-11-23 ENCOUNTER — Ambulatory Visit: Payer: Medicare HMO

## 2019-11-26 ENCOUNTER — Ambulatory Visit: Payer: Medicare HMO

## 2019-12-07 ENCOUNTER — Other Ambulatory Visit: Payer: Self-pay | Admitting: Family Medicine

## 2019-12-26 ENCOUNTER — Inpatient Hospital Stay (HOSPITAL_COMMUNITY)
Admission: EM | Admit: 2019-12-26 | Discharge: 2019-12-28 | DRG: 247 | Disposition: A | Discharge status: Home / Self Care | Payer: Medicare HMO | Attending: Cardiology | Admitting: Cardiology

## 2019-12-26 ENCOUNTER — Encounter (HOSPITAL_COMMUNITY): Payer: Self-pay

## 2019-12-26 ENCOUNTER — Emergency Department (HOSPITAL_COMMUNITY): Payer: Medicare HMO

## 2019-12-26 DIAGNOSIS — Z87891 Personal history of nicotine dependence: Secondary | ICD-10-CM

## 2019-12-26 DIAGNOSIS — Z79899 Other long term (current) drug therapy: Secondary | ICD-10-CM

## 2019-12-26 DIAGNOSIS — E785 Hyperlipidemia, unspecified: Secondary | ICD-10-CM | POA: Diagnosis present

## 2019-12-26 DIAGNOSIS — Z882 Allergy status to sulfonamides status: Secondary | ICD-10-CM

## 2019-12-26 DIAGNOSIS — I214 Non-ST elevation (NSTEMI) myocardial infarction: Secondary | ICD-10-CM | POA: Diagnosis not present

## 2019-12-26 DIAGNOSIS — Z961 Presence of intraocular lens: Secondary | ICD-10-CM | POA: Diagnosis present

## 2019-12-26 DIAGNOSIS — Z880 Allergy status to penicillin: Secondary | ICD-10-CM

## 2019-12-26 DIAGNOSIS — G8929 Other chronic pain: Secondary | ICD-10-CM | POA: Diagnosis present

## 2019-12-26 DIAGNOSIS — E669 Obesity, unspecified: Secondary | ICD-10-CM | POA: Diagnosis present

## 2019-12-26 DIAGNOSIS — Z7951 Long term (current) use of inhaled steroids: Secondary | ICD-10-CM

## 2019-12-26 DIAGNOSIS — R7303 Prediabetes: Secondary | ICD-10-CM | POA: Diagnosis present

## 2019-12-26 DIAGNOSIS — I10 Essential (primary) hypertension: Secondary | ICD-10-CM | POA: Diagnosis present

## 2019-12-26 DIAGNOSIS — M19012 Primary osteoarthritis, left shoulder: Secondary | ICD-10-CM | POA: Diagnosis present

## 2019-12-26 DIAGNOSIS — K219 Gastro-esophageal reflux disease without esophagitis: Secondary | ICD-10-CM | POA: Diagnosis present

## 2019-12-26 DIAGNOSIS — J439 Emphysema, unspecified: Secondary | ICD-10-CM | POA: Diagnosis present

## 2019-12-26 DIAGNOSIS — Z955 Presence of coronary angioplasty implant and graft: Secondary | ICD-10-CM

## 2019-12-26 DIAGNOSIS — I2510 Atherosclerotic heart disease of native coronary artery without angina pectoris: Secondary | ICD-10-CM | POA: Diagnosis present

## 2019-12-26 DIAGNOSIS — Z888 Allergy status to other drugs, medicaments and biological substances status: Secondary | ICD-10-CM

## 2019-12-26 DIAGNOSIS — Z9841 Cataract extraction status, right eye: Secondary | ICD-10-CM

## 2019-12-26 DIAGNOSIS — F329 Major depressive disorder, single episode, unspecified: Secondary | ICD-10-CM | POA: Diagnosis present

## 2019-12-26 DIAGNOSIS — Z8249 Family history of ischemic heart disease and other diseases of the circulatory system: Secondary | ICD-10-CM

## 2019-12-26 DIAGNOSIS — Z6838 Body mass index (BMI) 38.0-38.9, adult: Secondary | ICD-10-CM

## 2019-12-26 DIAGNOSIS — Z20822 Contact with and (suspected) exposure to covid-19: Secondary | ICD-10-CM | POA: Diagnosis present

## 2019-12-26 DIAGNOSIS — M542 Cervicalgia: Secondary | ICD-10-CM | POA: Diagnosis present

## 2019-12-26 DIAGNOSIS — J449 Chronic obstructive pulmonary disease, unspecified: Secondary | ICD-10-CM | POA: Diagnosis present

## 2019-12-26 DIAGNOSIS — I252 Old myocardial infarction: Secondary | ICD-10-CM | POA: Diagnosis present

## 2019-12-26 LAB — CBC
HCT: 46.1 % (ref 39.0–52.0)
Hemoglobin: 14.7 g/dL (ref 13.0–17.0)
MCH: 28.4 pg (ref 26.0–34.0)
MCHC: 31.9 g/dL (ref 30.0–36.0)
MCV: 89 fL (ref 80.0–100.0)
Platelets: 256 10*3/uL (ref 150–400)
RBC: 5.18 MIL/uL (ref 4.22–5.81)
RDW: 14.7 % (ref 11.5–15.5)
WBC: 8.4 10*3/uL (ref 4.0–10.5)
nRBC: 0 % (ref 0.0–0.2)

## 2019-12-26 MED ORDER — SODIUM CHLORIDE 0.9% FLUSH: 3.0000 mL | Freq: Once | INTRAVENOUS | Status: DC

## 2019-12-26 NOTE — ED Triage Notes (Signed)
Pt comes via [facility] for CP that started at 915, L sided, hx of heart cath in the past. Radiation to L arm, PTA received 324 ASA, one nitro with relief of pain

## 2019-12-27 ENCOUNTER — Other Ambulatory Visit: Payer: Self-pay

## 2019-12-27 ENCOUNTER — Other Ambulatory Visit (HOSPITAL_COMMUNITY): Payer: Medicare HMO

## 2019-12-27 ENCOUNTER — Encounter (HOSPITAL_COMMUNITY)
Admission: EM | Disposition: A | Discharge status: Home / Self Care | Payer: Self-pay | Source: Home / Self Care | Attending: Cardiology

## 2019-12-27 DIAGNOSIS — M25512 Pain in left shoulder: Secondary | ICD-10-CM | POA: Diagnosis present

## 2019-12-27 DIAGNOSIS — K219 Gastro-esophageal reflux disease without esophagitis: Secondary | ICD-10-CM | POA: Diagnosis present

## 2019-12-27 DIAGNOSIS — I214 Non-ST elevation (NSTEMI) myocardial infarction: Secondary | ICD-10-CM | POA: Diagnosis present

## 2019-12-27 DIAGNOSIS — Z961 Presence of intraocular lens: Secondary | ICD-10-CM | POA: Diagnosis present

## 2019-12-27 DIAGNOSIS — Z87891 Personal history of nicotine dependence: Secondary | ICD-10-CM | POA: Diagnosis not present

## 2019-12-27 DIAGNOSIS — Z955 Presence of coronary angioplasty implant and graft: Secondary | ICD-10-CM | POA: Diagnosis not present

## 2019-12-27 DIAGNOSIS — Z882 Allergy status to sulfonamides status: Secondary | ICD-10-CM | POA: Diagnosis not present

## 2019-12-27 DIAGNOSIS — Z6838 Body mass index (BMI) 38.0-38.9, adult: Secondary | ICD-10-CM | POA: Diagnosis not present

## 2019-12-27 DIAGNOSIS — E669 Obesity, unspecified: Secondary | ICD-10-CM | POA: Diagnosis present

## 2019-12-27 DIAGNOSIS — Z880 Allergy status to penicillin: Secondary | ICD-10-CM | POA: Diagnosis not present

## 2019-12-27 DIAGNOSIS — Z7951 Long term (current) use of inhaled steroids: Secondary | ICD-10-CM | POA: Diagnosis not present

## 2019-12-27 DIAGNOSIS — M542 Cervicalgia: Secondary | ICD-10-CM | POA: Diagnosis present

## 2019-12-27 DIAGNOSIS — R7303 Prediabetes: Secondary | ICD-10-CM | POA: Diagnosis present

## 2019-12-27 DIAGNOSIS — Z888 Allergy status to other drugs, medicaments and biological substances status: Secondary | ICD-10-CM | POA: Diagnosis not present

## 2019-12-27 DIAGNOSIS — I2510 Atherosclerotic heart disease of native coronary artery without angina pectoris: Secondary | ICD-10-CM | POA: Diagnosis present

## 2019-12-27 DIAGNOSIS — E782 Mixed hyperlipidemia: Secondary | ICD-10-CM

## 2019-12-27 DIAGNOSIS — J449 Chronic obstructive pulmonary disease, unspecified: Secondary | ICD-10-CM | POA: Diagnosis present

## 2019-12-27 DIAGNOSIS — Z9841 Cataract extraction status, right eye: Secondary | ICD-10-CM | POA: Diagnosis not present

## 2019-12-27 DIAGNOSIS — Z8249 Family history of ischemic heart disease and other diseases of the circulatory system: Secondary | ICD-10-CM | POA: Diagnosis not present

## 2019-12-27 DIAGNOSIS — I10 Essential (primary) hypertension: Secondary | ICD-10-CM | POA: Diagnosis present

## 2019-12-27 DIAGNOSIS — G8929 Other chronic pain: Secondary | ICD-10-CM | POA: Diagnosis present

## 2019-12-27 DIAGNOSIS — I252 Old myocardial infarction: Secondary | ICD-10-CM | POA: Diagnosis present

## 2019-12-27 DIAGNOSIS — E785 Hyperlipidemia, unspecified: Secondary | ICD-10-CM | POA: Diagnosis present

## 2019-12-27 DIAGNOSIS — Z79899 Other long term (current) drug therapy: Secondary | ICD-10-CM | POA: Diagnosis not present

## 2019-12-27 DIAGNOSIS — F329 Major depressive disorder, single episode, unspecified: Secondary | ICD-10-CM | POA: Diagnosis present

## 2019-12-27 DIAGNOSIS — Z20822 Contact with and (suspected) exposure to covid-19: Secondary | ICD-10-CM | POA: Diagnosis present

## 2019-12-27 HISTORY — PX: CORONARY STENT INTERVENTION: CATH118234

## 2019-12-27 HISTORY — PX: LEFT HEART CATH AND CORONARY ANGIOGRAPHY: CATH118249

## 2019-12-27 LAB — BASIC METABOLIC PANEL
Anion gap: 13 (ref 5–15)
Anion gap: 11 (ref 5–15)
BUN: 22 mg/dL (ref 8–23)
BUN: 19 mg/dL (ref 8–23)
CO2: 24 mmol/L (ref 22–32)
CO2: 22 mmol/L (ref 22–32)
Calcium: 9.5 mg/dL (ref 8.9–10.3)
Calcium: 9 mg/dL (ref 8.9–10.3)
Chloride: 102 mmol/L (ref 98–111)
Chloride: 103 mmol/L (ref 98–111)
Creatinine, Ser: 1.11 mg/dL (ref 0.61–1.24)
Creatinine, Ser: 1.01 mg/dL (ref 0.61–1.24)
GFR calc Af Amer: >60 mL/min (ref >60)
GFR calc Af Amer: >60 mL/min (ref >60)
GFR calc non Af Amer: >60 mL/min (ref >60)
GFR calc non Af Amer: >60 mL/min (ref >60)
Glucose, Bld: 119 mg/dL — ABNORMAL HIGH (ref 70–99)
Glucose, Bld: 123 mg/dL — ABNORMAL HIGH (ref 70–99)
Potassium: 4.1 mmol/L (ref 3.5–5.1)
Potassium: 4.1 mmol/L (ref 3.5–5.1)
Sodium: 139 mmol/L (ref 135–145)
Sodium: 136 mmol/L (ref 135–145)

## 2019-12-27 LAB — POCT ACTIVATED CLOTTING TIME
Activated Clotting Time: 263 seconds
Activated Clotting Time: 268 seconds
Activated Clotting Time: 290 seconds

## 2019-12-27 LAB — LIPID PANEL
Cholesterol: 243 mg/dL — ABNORMAL HIGH (ref 0–200)
HDL: 39 mg/dL — ABNORMAL LOW (ref >40)
LDL Cholesterol: 146 mg/dL — ABNORMAL HIGH (ref 0–99)
Total CHOL/HDL Ratio: 6.2 RATIO
Triglycerides: 288 mg/dL — ABNORMAL HIGH (ref <150)
VLDL: 58 mg/dL — ABNORMAL HIGH (ref 0–40)

## 2019-12-27 LAB — RESPIRATORY PANEL BY RT PCR (FLU A&B, COVID)
Influenza A by PCR: NEGATIVE
Influenza B by PCR: NEGATIVE
SARS Coronavirus 2 by RT PCR: NEGATIVE

## 2019-12-27 LAB — HEPARIN LEVEL (UNFRACTIONATED): Heparin Unfractionated: <0.1 IU/mL — ABNORMAL LOW (ref 0.30–0.70)

## 2019-12-27 LAB — TROPONIN I (HIGH SENSITIVITY)
Troponin I (High Sensitivity): 414 ng/L (ref <18)
Troponin I (High Sensitivity): 424 ng/L (ref <18)

## 2019-12-27 SURGERY — LEFT HEART CATH AND CORONARY ANGIOGRAPHY
Anesthesia: LOCAL

## 2019-12-27 MED ORDER — VERAPAMIL HCL 2.5 MG/ML IV SOLN
INTRAVENOUS | Status: AC
  Filled 2019-12-27: qty 2

## 2019-12-27 MED ORDER — HEPARIN BOLUS VIA INFUSION
4000.0000 [IU] | Freq: Once | INTRAVENOUS | Status: AC
  Administered 2019-12-27: 4000 [IU] via INTRAVENOUS
  Filled 2019-12-27: qty 4000

## 2019-12-27 MED ORDER — ROSUVASTATIN CALCIUM 5 MG PO TABS
10.0000 mg | ORAL_TABLET | Freq: Every day | ORAL | Status: DC
  Administered 2019-12-27: 18:00:00 10 mg via ORAL
  Filled 2019-12-27: qty 2

## 2019-12-27 MED ORDER — PRASUGREL HCL 10 MG PO TABS
ORAL_TABLET | ORAL | Status: AC
  Filled 2019-12-27: qty 5

## 2019-12-27 MED ORDER — FUROSEMIDE 10 MG/ML IJ SOLN
20.0000 mg | Freq: Once | INTRAMUSCULAR | Status: AC
  Administered 2019-12-27: 20 mg via INTRAVENOUS
  Filled 2019-12-27: qty 2

## 2019-12-27 MED ORDER — HYDROCHLOROTHIAZIDE 12.5 MG PO CAPS
12.5000 mg | ORAL_CAPSULE | Freq: Every day | ORAL | Status: DC
  Filled 2019-12-27: qty 1

## 2019-12-27 MED ORDER — IRBESARTAN 150 MG PO TABS
150.0000 mg | ORAL_TABLET | Freq: Every day | ORAL | Status: DC
  Filled 2019-12-27: qty 1

## 2019-12-27 MED ORDER — INFLUENZA VAC A&B SA ADJ QUAD 0.5 ML IM PRSY
0.5000 mL | PREFILLED_SYRINGE | INTRAMUSCULAR | Status: DC
  Filled 2019-12-27: qty 0.5

## 2019-12-27 MED ORDER — OLMESARTAN MEDOXOMIL-HCTZ 20-12.5 MG PO TABS: 1.0000 | ORAL_TABLET | Freq: Every day | ORAL | Status: DC

## 2019-12-27 MED ORDER — ONDANSETRON HCL 4 MG/2ML IJ SOLN: 4.0000 mg | Freq: Four times a day (QID) | INTRAMUSCULAR | Status: DC | PRN

## 2019-12-27 MED ORDER — HEPARIN SODIUM (PORCINE) 1000 UNIT/ML IJ SOLN
INTRAMUSCULAR | Status: DC | PRN
  Administered 2019-12-27 (×2): 3000 [IU] via INTRAVENOUS
  Administered 2019-12-27: 5000 [IU] via INTRAVENOUS
  Administered 2019-12-27: 8000 [IU] via INTRAVENOUS

## 2019-12-27 MED ORDER — SODIUM CHLORIDE 0.9 % IV SOLN: 250.0000 mL | INTRAVENOUS | Status: DC | PRN

## 2019-12-27 MED ORDER — SODIUM CHLORIDE 0.9% FLUSH: 3.0000 mL | INTRAVENOUS | Status: DC | PRN

## 2019-12-27 MED ORDER — METOPROLOL TARTRATE 12.5 MG HALF TABLET
12.5000 mg | ORAL_TABLET | Freq: Two times a day (BID) | ORAL | Status: DC
  Administered 2019-12-27 – 2019-12-28 (×3): 12.5 mg via ORAL
  Filled 2019-12-27 (×3): qty 1

## 2019-12-27 MED ORDER — FUROSEMIDE 40 MG PO TABS: 40.0000 mg | ORAL_TABLET | Freq: Every day | ORAL | Status: DC

## 2019-12-27 MED ORDER — FENTANYL CITRATE (PF) 100 MCG/2ML IJ SOLN
INTRAMUSCULAR | Status: DC | PRN
  Administered 2019-12-27: 50 ug via INTRAVENOUS

## 2019-12-27 MED ORDER — PRASUGREL HCL 10 MG PO TABS
ORAL_TABLET | ORAL | Status: DC | PRN
  Administered 2019-12-27: 60 mg via ORAL

## 2019-12-27 MED ORDER — ASPIRIN EC 81 MG PO TBEC: 81.0000 mg | DELAYED_RELEASE_TABLET | Freq: Every day | ORAL | Status: DC

## 2019-12-27 MED ORDER — ASPIRIN EC 81 MG PO TBEC
81.0000 mg | DELAYED_RELEASE_TABLET | Freq: Every day | ORAL | Status: DC
  Administered 2019-12-28: 81 mg via ORAL
  Filled 2019-12-27: qty 1

## 2019-12-27 MED ORDER — PRASUGREL HCL 10 MG PO TABS
10.0000 mg | ORAL_TABLET | Freq: Every day | ORAL | Status: DC
  Administered 2019-12-28: 10 mg via ORAL
  Filled 2019-12-27: qty 1

## 2019-12-27 MED ORDER — HEPARIN (PORCINE) 25000 UT/250ML-% IV SOLN
1450.0000 [IU]/h | INTRAVENOUS | Status: DC
  Administered 2019-12-27: 1200 [IU]/h via INTRAVENOUS
  Filled 2019-12-27: qty 250

## 2019-12-27 MED ORDER — HEPARIN SODIUM (PORCINE) 1000 UNIT/ML IJ SOLN
INTRAMUSCULAR | Status: AC
  Filled 2019-12-27: qty 1

## 2019-12-27 MED ORDER — NITROGLYCERIN 1 MG/10 ML FOR IR/CATH LAB
INTRA_ARTERIAL | Status: DC | PRN
  Administered 2019-12-27: 200 ug via INTRACORONARY

## 2019-12-27 MED ORDER — SODIUM CHLORIDE 0.9 % WEIGHT BASED INFUSION
3.0000 mL/kg/h | INTRAVENOUS | Status: DC
  Administered 2019-12-27: 3 mL/kg/h via INTRAVENOUS

## 2019-12-27 MED ORDER — FENTANYL CITRATE (PF) 100 MCG/2ML IJ SOLN
INTRAMUSCULAR | Status: AC
  Filled 2019-12-27: qty 2

## 2019-12-27 MED ORDER — LIDOCAINE HCL (PF) 1 % IJ SOLN
INTRAMUSCULAR | Status: AC
  Filled 2019-12-27: qty 30

## 2019-12-27 MED ORDER — HEPARIN (PORCINE) IN NACL 1000-0.9 UT/500ML-% IV SOLN
INTRAVENOUS | Status: AC
  Filled 2019-12-27: qty 1000

## 2019-12-27 MED ORDER — MIDAZOLAM HCL 2 MG/2ML IJ SOLN
INTRAMUSCULAR | Status: DC | PRN
  Administered 2019-12-27: 1 mg via INTRAVENOUS

## 2019-12-27 MED ORDER — NITROGLYCERIN 0.4 MG SL SUBL: 0.4000 mg | SUBLINGUAL_TABLET | SUBLINGUAL | Status: DC | PRN

## 2019-12-27 MED ORDER — HEPARIN (PORCINE) IN NACL 1000-0.9 UT/500ML-% IV SOLN
INTRAVENOUS | Status: DC | PRN
  Administered 2019-12-27 (×2): 500 mL

## 2019-12-27 MED ORDER — ENOXAPARIN SODIUM 40 MG/0.4ML ~~LOC~~ SOLN
40.0000 mg | SUBCUTANEOUS | Status: DC
  Administered 2019-12-28: 40 mg via SUBCUTANEOUS
  Filled 2019-12-27: qty 0.4

## 2019-12-27 MED ORDER — SODIUM CHLORIDE 0.9% FLUSH
3.0000 mL | Freq: Two times a day (BID) | INTRAVENOUS | Status: DC
  Administered 2019-12-27: 10:00:00 3 mL via INTRAVENOUS

## 2019-12-27 MED ORDER — ACETAMINOPHEN 325 MG PO TABS
650.0000 mg | ORAL_TABLET | ORAL | Status: DC | PRN
  Administered 2019-12-27: 650 mg via ORAL
  Filled 2019-12-27: qty 2

## 2019-12-27 MED ORDER — CLOPIDOGREL BISULFATE 300 MG PO TABS
300.0000 mg | ORAL_TABLET | Freq: Once | ORAL | Status: DC
  Filled 2019-12-27: qty 1

## 2019-12-27 MED ORDER — MIDAZOLAM HCL 2 MG/2ML IJ SOLN
INTRAMUSCULAR | Status: AC
  Filled 2019-12-27: qty 2

## 2019-12-27 MED ORDER — HYDRALAZINE HCL 20 MG/ML IJ SOLN: 10.0000 mg | INTRAMUSCULAR | Status: AC | PRN

## 2019-12-27 MED ORDER — SODIUM CHLORIDE 0.9 % IV SOLN: INTRAVENOUS | Status: DC

## 2019-12-27 MED ORDER — VERAPAMIL HCL 2.5 MG/ML IV SOLN
INTRAVENOUS | Status: DC | PRN
  Administered 2019-12-27: 10 mL via INTRA_ARTERIAL

## 2019-12-27 MED ORDER — NITROGLYCERIN 2 % TD OINT
1.0000 [in_us] | TOPICAL_OINTMENT | Freq: Once | TRANSDERMAL | Status: AC
  Administered 2019-12-27: 01:00:00 1 [in_us] via TOPICAL
  Filled 2019-12-27: qty 1

## 2019-12-27 MED ORDER — MOMETASONE FURO-FORMOTEROL FUM 100-5 MCG/ACT IN AERO
2.0000 | INHALATION_SPRAY | Freq: Two times a day (BID) | RESPIRATORY_TRACT | Status: DC
  Administered 2019-12-27 (×2): 2 via RESPIRATORY_TRACT
  Filled 2019-12-27: qty 8.8

## 2019-12-27 MED ORDER — PRASUGREL HCL 10 MG PO TABS
ORAL_TABLET | ORAL | Status: AC
  Filled 2019-12-27: qty 1

## 2019-12-27 MED ORDER — IOHEXOL 350 MG/ML SOLN
INTRAVENOUS | Status: DC | PRN
  Administered 2019-12-27: 155 mL via INTRA_ARTERIAL

## 2019-12-27 MED ORDER — ASPIRIN 81 MG PO CHEW
81.0000 mg | CHEWABLE_TABLET | ORAL | Status: AC
  Administered 2019-12-27: 81 mg via ORAL
  Filled 2019-12-27: qty 1

## 2019-12-27 MED ORDER — ACETAMINOPHEN 325 MG PO TABS: 650.0000 mg | ORAL_TABLET | ORAL | Status: DC | PRN

## 2019-12-27 MED ORDER — LIDOCAINE HCL (PF) 1 % IJ SOLN
INTRAMUSCULAR | Status: DC | PRN
  Administered 2019-12-27: 2 mL via INTRADERMAL

## 2019-12-27 MED ORDER — NITROGLYCERIN 1 MG/10 ML FOR IR/CATH LAB
INTRA_ARTERIAL | Status: AC
  Filled 2019-12-27: qty 10

## 2019-12-27 MED ORDER — LABETALOL HCL 5 MG/ML IV SOLN: 10.0000 mg | INTRAVENOUS | Status: AC | PRN

## 2019-12-27 MED ORDER — BUPROPION HCL ER (XL) 150 MG PO TB24
150.0000 mg | ORAL_TABLET | Freq: Every day | ORAL | Status: DC
  Administered 2019-12-27 – 2019-12-28 (×2): 150 mg via ORAL
  Filled 2019-12-27 (×2): qty 1

## 2019-12-27 MED ORDER — SODIUM CHLORIDE 0.9% FLUSH: 3.0000 mL | Freq: Two times a day (BID) | INTRAVENOUS | Status: DC

## 2019-12-27 MED ORDER — ATORVASTATIN CALCIUM 10 MG PO TABS: 20.0000 mg | ORAL_TABLET | Freq: Every day | ORAL | Status: DC

## 2019-12-27 MED ORDER — ASPIRIN 81 MG PO CHEW: 81.0000 mg | CHEWABLE_TABLET | ORAL | Status: DC

## 2019-12-27 MED ORDER — SODIUM CHLORIDE 0.9 % WEIGHT BASED INFUSION
1.0000 mL/kg/h | INTRAVENOUS | Status: DC
  Administered 2019-12-27: 1 mL/kg/h via INTRAVENOUS

## 2019-12-27 SURGICAL SUPPLY — 18 items
BALLN SAPPHIRE 2.25X15 (BALLOONS) ×2
BALLN SAPPHIRE ~~LOC~~ 2.75X12 (BALLOONS) ×1 IMPLANT
BALLOON SAPPHIRE 2.25X15 (BALLOONS) IMPLANT
CATH INFINITI 5FR ANG PIGTAIL (CATHETERS) ×1 IMPLANT
CATH LAUNCHER 6FR EBU3.5 (CATHETERS) ×1 IMPLANT
CATH OPTITORQUE TIG 4.0 5F (CATHETERS) ×1 IMPLANT
DEVICE RAD COMP TR BAND LRG (VASCULAR PRODUCTS) ×1 IMPLANT
GLIDESHEATH SLEND SS 6F .021 (SHEATH) ×1 IMPLANT
GUIDEWIRE INQWIRE 1.5J.035X260 (WIRE) IMPLANT
INQWIRE 1.5J .035X260CM (WIRE) ×2
KIT ENCORE 26 ADVANTAGE (KITS) ×1 IMPLANT
KIT HEART LEFT (KITS) ×2 IMPLANT
PACK CARDIAC CATHETERIZATION (CUSTOM PROCEDURE TRAY) ×2 IMPLANT
STENT RESOLUTE ONYX 2.5X18 (Permanent Stent) ×1 IMPLANT
SYR MEDRAD MARK 7 150ML (SYRINGE) ×2 IMPLANT
TRANSDUCER W/STOPCOCK (MISCELLANEOUS) ×2 IMPLANT
TUBING CIL FLEX 10 FLL-RA (TUBING) ×2 IMPLANT
WIRE RUNTHROUGH .014X180CM (WIRE) ×1 IMPLANT

## 2019-12-27 NOTE — Interval H&P Note (Signed)
History and Physical Interval Note:  12/27/2019 3:47 PM  Allen [name]  has presented today for surgery, with the diagnosis of NSTEMI.  The various methods of treatment have been discussed with the patient and family. After consideration of risks, benefits and other options for treatment, the patient has consented to  Procedure(s): LEFT HEART CATH AND CORONARY ANGIOGRAPHY (N/A) as a surgical intervention.  The patient's history has been reviewed, patient examined, no change in status, stable for surgery.  I have reviewed the patient's chart and labs.  Questions were answered to the patient's satisfaction.    Cath Lab Visit (complete for each Cath Lab visit)  Clinical Evaluation Leading to the Procedure:   ACS: Yes.    Non-ACS:  N/A  [name]

## 2019-12-27 NOTE — Progress Notes (Signed)
Patient noted to have hematoma right wrist.  Pressure held for 10 minutes.  PA Meng notified and here to see patient.  Advised to keep TR band in current position and continue to monitor.
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
## 2019-12-27 NOTE — H&P (View-Only) (Signed)
Progress Note  Patient Name: Allen [name] Date of Encounter: 12/27/2019  Primary Cardiologist: No primary care provider on file.   Subjective   No chest pain this morning.   Inpatient Medications    Scheduled Meds: . [START ON 12/28/2019] aspirin EC  81 mg Oral Daily  . atorvastatin  20 mg Oral q1800  . buPROPion  150 mg Oral Daily  . hydrochlorothiazide  12.5 mg Oral Daily   And  . irbesartan  150 mg Oral Daily  . [START ON 12/28/2019] influenza vaccine adjuvanted  0.5 mL Intramuscular Tomorrow-1000  . metoprolol tartrate  12.5 mg Oral BID  . mometasone-formoterol  2 puff Inhalation BID  . sodium chloride flush  3 mL Intravenous Once   Continuous Infusions: . sodium chloride 125 mL/hr at 12/27/19 0136  . heparin 1,200 Units/hr (12/27/19 0807)   PRN Meds: acetaminophen, nitroGLYCERIN, ondansetron (ZOFRAN) IV   Vital Signs    Vitals:   12/27/19 0145 12/27/19 0227 12/27/19 0809 12/27/19 0825  BP: 135/74 (!) 182/87 (!) 115/51   Pulse: 73 72 67   Resp: 19 13 17    Temp:  98 F (36.7 C)    TempSrc:  Oral    SpO2: 98% 98%  97%  Weight:  130.7 kg    Height:  6' (1.829 m)      Intake/Output Summary (Last 24 hours) at 12/27/2019 0840 Last data filed at 12/27/2019 [id number] Gross per 24 hour  Intake 116.47 ml  Output --  Net 116.47 ml   Last 3 Weights 12/27/2019 12/27/2019 12/27/2019  Weight (lbs) 288 lb 1.6 oz 273 lb 280 lb  Weight (kg) 130.681 kg 123.832 kg 127.007 kg      Telemetry    SR - Personally Reviewed  ECG    SR with nonspecific TW changes - Personally Reviewed  Physical Exam  Pleasant older WM, laying in bed.  GEN: No acute distress.   Neck: No JVD Cardiac: RRR, no murmurs, rubs, or gallops.  Respiratory: Clear to auscultation bilaterally. GI: Soft, nontender, non-distended  MS: No edema; No deformity. Neuro:  Nonfocal  Psych: Normal affect   Labs    High Sensitivity Troponin:   Recent Labs  Lab 12/26/19 2324 12/27/19 0118  TROPONINIHS 414* 424*       Chemistry Recent Labs  Lab 12/26/19 2324  NA 139  K 4.1  CL 102  CO2 24  GLUCOSE 119*  BUN 22  CREATININE 1.11  CALCIUM 9.5  GFRNONAA >60  GFRAA >60  ANIONGAP 13     Hematology Recent Labs  Lab 12/26/19 2324  WBC 8.4  RBC 5.18  HGB 14.7  HCT 46.1  MCV 89.0  MCH 28.4  MCHC 31.9  RDW 14.7  PLT 256    Lipid Panel     Component Value Date/Time   CHOL 243 (H) 12/27/2019 0928   TRIG 288 (H) 12/27/2019 0928   HDL 39 (L) 12/27/2019 0928   CHOLHDL 6.2 12/27/2019 0928   VLDL 58 (H) 12/27/2019 0928   LDLCALC 146 (H) 12/27/2019 0928   LDLDIRECT 122.0 06/17/2018 0943      BNPNo results for input(s): BNP, PROBNP in the last 168 hours.   DDimer No results for input(s): DDIMER in the last 168 hours.   Radiology    DG Chest 2 View  Result Date: 12/27/2019 CLINICAL DATA:  Chest pain EXAM: CHEST - 2 VIEW COMPARISON:  April 26, 2014 FINDINGS: There is a right infrahilar airspace opacity which is essentially  stable from 2015 and is of doubtful clinical significance. The heart size is stable from prior study. There is no pneumothorax. No large pleural effusion. No acute displaced fracture. Degenerative changes are noted of the thoracic spine. Atherosclerotic changes are noted of the thoracic aorta. IMPRESSION: No active cardiopulmonary disease. Electronically Signed   By: Constance Holster M.D.   On: 12/27/2019 00:25    Cardiac Studies   Echo: 11/2018  IMPRESSIONS    1. The left ventricle has normal systolic function with an ejection  fraction of 60-65%. The cavity size was normal. There is mildly increased  left ventricular wall thickness. Left ventricular diastolic Doppler  parameters are consistent with impaired  relaxation.  2. The right ventricle has normal systolic function. The cavity was  normal. There is no increase in right ventricular wall thickness.  3. The mitral valve is normal in structure.  4. The tricuspid valve is normal in structure.  5.  The aortic valve is tricuspid.  6. The pulmonic valve was normal in structure.  7. Right atrial pressure is estimated at 3 mmHg.   Patient Profile     74 y.o. male with PMH of COPD, former tobacco use, HTN, HL who presented with chest pain. Found to have a mildly elevated troponin.   Assessment & Plan    1. NSTEMI: has been having intermittent episodes of chest pain for quite some time but episode last evening was different. Had a stress test back in 2018 that showed mild ischemia in the RCA which was reflective of his moderate RCA disease int he past. Elected for forego cath at that time.  -- this episode last night seemed more intense. hsTn 414>>424. EKG without acute ischemia. Does have known nonobstructive CAD from cath in 2015. With symptoms and mildly elevated troponin, have recommended cardiac cath.  -- The patient understands that risks included but are not limited to stroke (1 in 1000), death (1 in 22), kidney failure [usually temporary] (1 in 500), bleeding (1 in 200), allergic reaction [possibly serious] (1 in 200).  -- remains on IV heparin -- on ASA, statin, BB and ARB/HCTZ  2. HTN: blood pressures are stable this morning. Will hold ARB/HCTZ combo with plans for cath.   3. HL: has been tried on atorvastatin in the past and developed myalgias. Will try Crestor 10mg .  -- check lipids  4. COPD: stable on exam  For questions or updates, please contact [place] Please consult [email or web address] for contact info under        Signed, Reino Bellis, NP  12/27/2019, 8:40 AM    Patient seen and examined. Agree with assessment and plan. No recurrent chest pain this am.  Troponin mildly increased at 424 today. No acute ST changes on ECG. No JVD, no rales, RRR in the 70s, central adiposity, venous stasis changes LE with mild edema. I discussed risks/benefits of cath with potential PCI in detail and he agrees to proceed.  Lipids elevated in a mixed hyperlipidemic pattern, LDL  146, TG 288. With prior myalgias, agree with low dose rosuvastatin trial and will add zetia 10 for synergistic benefit; Consider vascepa, but will not add presently.   Troy Sine, MD, [facility] 12/27/2019 10:36 AM

## 2019-12-27 NOTE — Progress Notes (Signed)
Patient returned from cath lab at 1730hrs.  R radial site with small hematoma, level 1, TR band in place with 13cc, Dr. Saunders Revel here and additional 2cc of air added to TR band. Pressure held to R wrist for 5 minutes. Will continue to monitor.

## 2019-12-27 NOTE — Progress Notes (Signed)
Patient had 7 beat run VT.  Sitting on side of bed, denies dizziness, SOB, or CP.  BP 133/72, HR 56 SB.  NP Mancel Bale notified.

## 2019-12-27 NOTE — Progress Notes (Addendum)
Progress Note  Patient Name: Allen [name] Date of Encounter: 12/27/2019  Primary Cardiologist: No primary care provider on file.   Subjective   No chest pain this morning.   Inpatient Medications    Scheduled Meds: . [START ON 12/28/2019] aspirin EC  81 mg Oral Daily  . atorvastatin  20 mg Oral q1800  . buPROPion  150 mg Oral Daily  . hydrochlorothiazide  12.5 mg Oral Daily   And  . irbesartan  150 mg Oral Daily  . [START ON 12/28/2019] influenza vaccine adjuvanted  0.5 mL Intramuscular Tomorrow-1000  . metoprolol tartrate  12.5 mg Oral BID  . mometasone-formoterol  2 puff Inhalation BID  . sodium chloride flush  3 mL Intravenous Once   Continuous Infusions: . sodium chloride 125 mL/hr at 12/27/19 0136  . heparin 1,200 Units/hr (12/27/19 0807)   PRN Meds: acetaminophen, nitroGLYCERIN, ondansetron (ZOFRAN) IV   Vital Signs    Vitals:   12/27/19 0145 12/27/19 0227 12/27/19 0809 12/27/19 0825  BP: 135/74 (!) 182/87 (!) 115/51   Pulse: 73 72 67   Resp: 19 13 17    Temp:  98 F (36.7 C)    TempSrc:  Oral    SpO2: 98% 98%  97%  Weight:  130.7 kg    Height:  6' (1.829 m)      Intake/Output Summary (Last 24 hours) at 12/27/2019 0840 Last data filed at 12/27/2019 [id number] Gross per 24 hour  Intake 116.47 ml  Output --  Net 116.47 ml   Last 3 Weights 12/27/2019 12/27/2019 12/27/2019  Weight (lbs) 288 lb 1.6 oz 273 lb 280 lb  Weight (kg) 130.681 kg 123.832 kg 127.007 kg      Telemetry    SR - Personally Reviewed  ECG    SR with nonspecific TW changes - Personally Reviewed  Physical Exam  Pleasant older WM, laying in bed.  GEN: No acute distress.   Neck: No JVD Cardiac: RRR, no murmurs, rubs, or gallops.  Respiratory: Clear to auscultation bilaterally. GI: Soft, nontender, non-distended  MS: No edema; No deformity. Neuro:  Nonfocal  Psych: Normal affect   Labs    High Sensitivity Troponin:   Recent Labs  Lab 12/26/19 2324 12/27/19 0118  TROPONINIHS 414* 424*       Chemistry Recent Labs  Lab 12/26/19 2324  NA 139  K 4.1  CL 102  CO2 24  GLUCOSE 119*  BUN 22  CREATININE 1.11  CALCIUM 9.5  GFRNONAA >60  GFRAA >60  ANIONGAP 13     Hematology Recent Labs  Lab 12/26/19 2324  WBC 8.4  RBC 5.18  HGB 14.7  HCT 46.1  MCV 89.0  MCH 28.4  MCHC 31.9  RDW 14.7  PLT 256    Lipid Panel     Component Value Date/Time   CHOL 243 (H) 12/27/2019 0928   TRIG 288 (H) 12/27/2019 0928   HDL 39 (L) 12/27/2019 0928   CHOLHDL 6.2 12/27/2019 0928   VLDL 58 (H) 12/27/2019 0928   LDLCALC 146 (H) 12/27/2019 0928   LDLDIRECT 122.0 06/17/2018 0943      BNPNo results for input(s): BNP, PROBNP in the last 168 hours.   DDimer No results for input(s): DDIMER in the last 168 hours.   Radiology    DG Chest 2 View  Result Date: 12/27/2019 CLINICAL DATA:  Chest pain EXAM: CHEST - 2 VIEW COMPARISON:  April 26, 2014 FINDINGS: There is a right infrahilar airspace opacity which is essentially  stable from 2015 and is of doubtful clinical significance. The heart size is stable from prior study. There is no pneumothorax. No large pleural effusion. No acute displaced fracture. Degenerative changes are noted of the thoracic spine. Atherosclerotic changes are noted of the thoracic aorta. IMPRESSION: No active cardiopulmonary disease. Electronically Signed   By: Constance Holster M.D.   On: 12/27/2019 00:25    Cardiac Studies   Echo: 11/2018  IMPRESSIONS    1. The left ventricle has normal systolic function with an ejection  fraction of 60-65%. The cavity size was normal. There is mildly increased  left ventricular wall thickness. Left ventricular diastolic Doppler  parameters are consistent with impaired  relaxation.  2. The right ventricle has normal systolic function. The cavity was  normal. There is no increase in right ventricular wall thickness.  3. The mitral valve is normal in structure.  4. The tricuspid valve is normal in structure.  5.  The aortic valve is tricuspid.  6. The pulmonic valve was normal in structure.  7. Right atrial pressure is estimated at 3 mmHg.   Patient Profile     74 y.o. male with PMH of COPD, former tobacco use, HTN, HL who presented with chest pain. Found to have a mildly elevated troponin.   Assessment & Plan    1. NSTEMI: has been having intermittent episodes of chest pain for quite some time but episode last evening was different. Had a stress test back in 2018 that showed mild ischemia in the RCA which was reflective of his moderate RCA disease int he past. Elected for forego cath at that time.  -- this episode last night seemed more intense. hsTn 414>>424. EKG without acute ischemia. Does have known nonobstructive CAD from cath in 2015. With symptoms and mildly elevated troponin, have recommended cardiac cath.  -- The patient understands that risks included but are not limited to stroke (1 in 1000), death (1 in 27), kidney failure [usually temporary] (1 in 500), bleeding (1 in 200), allergic reaction [possibly serious] (1 in 200).  -- remains on IV heparin -- on ASA, statin, BB and ARB/HCTZ  2. HTN: blood pressures are stable this morning. Will hold ARB/HCTZ combo with plans for cath.   3. HL: has been tried on atorvastatin in the past and developed myalgias. Will try Crestor 10mg .  -- check lipids  4. COPD: stable on exam  For questions or updates, please contact [place] Please consult [email or web address] for contact info under        Signed, Reino Bellis, NP  12/27/2019, 8:40 AM    Patient seen and examined. Agree with assessment and plan. No recurrent chest pain this am.  Troponin mildly increased at 424 today. No acute ST changes on ECG. No JVD, no rales, RRR in the 70s, central adiposity, venous stasis changes LE with mild edema. I discussed risks/benefits of cath with potential PCI in detail and he agrees to proceed.  Lipids elevated in a mixed hyperlipidemic pattern, LDL  146, TG 288. With prior myalgias, agree with low dose rosuvastatin trial and will add zetia 10 for synergistic benefit; Consider vascepa, but will not add presently.   Troy Sine, MD, [facility] 12/27/2019 10:36 AM
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
## 2019-12-27 NOTE — Progress Notes (Signed)
Advanced Directive paperwork delivered to Allen [name]. Please page spiritual care dept when he is ready to sign.  Rev. [place].

## 2019-12-27 NOTE — Plan of Care (Signed)
  Problem: Cardiovascular: Goal: Vascular access site(s) Level 0-1 will be maintained Outcome: Progressing TR band remains in place-slow air removal d/t bleeding   Problem: Health Behavior/Discharge Planning: Goal: Ability to manage health-related needs will improve Outcome: Completed/Met   Problem: Nutrition: Goal: Adequate nutrition will be maintained Outcome: Completed/Met   Problem: Activity: Goal: Ability to return to baseline activity level will improve Outcome: Completed/Met   Problem: Cardiovascular: Goal: Ability to achieve and maintain adequate cardiovascular perfusion will improve Outcome: Completed/Met
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
## 2019-12-28 ENCOUNTER — Inpatient Hospital Stay (HOSPITAL_COMMUNITY): Payer: Medicare HMO

## 2019-12-28 DIAGNOSIS — Z955 Presence of coronary angioplasty implant and graft: Secondary | ICD-10-CM

## 2019-12-28 DIAGNOSIS — E785 Hyperlipidemia, unspecified: Secondary | ICD-10-CM

## 2019-12-28 DIAGNOSIS — I214 Non-ST elevation (NSTEMI) myocardial infarction: Secondary | ICD-10-CM

## 2019-12-28 DIAGNOSIS — I10 Essential (primary) hypertension: Secondary | ICD-10-CM

## 2019-12-28 LAB — CBC
HCT: 44.8 % (ref 39.0–52.0)
Hemoglobin: 14.6 g/dL (ref 13.0–17.0)
MCH: 28.3 pg (ref 26.0–34.0)
MCHC: 32.6 g/dL (ref 30.0–36.0)
MCV: 86.8 fL (ref 80.0–100.0)
Platelets: 252 10*3/uL (ref 150–400)
RBC: 5.16 MIL/uL (ref 4.22–5.81)
RDW: 14.6 % (ref 11.5–15.5)
WBC: 11 10*3/uL — ABNORMAL HIGH (ref 4.0–10.5)
nRBC: 0 % (ref 0.0–0.2)

## 2019-12-28 LAB — ECHOCARDIOGRAM COMPLETE
Height: 72 in
Weight: 4513.6 oz

## 2019-12-28 LAB — BASIC METABOLIC PANEL
Anion gap: 12 (ref 5–15)
BUN: 14 mg/dL (ref 8–23)
CO2: 27 mmol/L (ref 22–32)
Calcium: 9.6 mg/dL (ref 8.9–10.3)
Chloride: 99 mmol/L (ref 98–111)
Creatinine, Ser: 1.2 mg/dL (ref 0.61–1.24)
GFR calc Af Amer: >60 mL/min (ref >60)
GFR calc non Af Amer: >60 mL/min (ref >60)
Glucose, Bld: 124 mg/dL — ABNORMAL HIGH (ref 70–99)
Potassium: 4.6 mmol/L (ref 3.5–5.1)
Sodium: 138 mmol/L (ref 135–145)

## 2019-12-28 MED ORDER — METOPROLOL TARTRATE 25 MG PO TABS: 12.5000 mg | ORAL_TABLET | Freq: Two times a day (BID) | ORAL | 0 refills | Status: DC

## 2019-12-28 MED ORDER — ROSUVASTATIN CALCIUM 10 MG PO TABS: 10.0000 mg | ORAL_TABLET | Freq: Every day | ORAL | 1 refills | Status: DC

## 2019-12-28 MED ORDER — PERFLUTREN LIPID MICROSPHERE
1.0000 mL | INTRAVENOUS | Status: AC | PRN
  Administered 2019-12-28: 3 mL via INTRAVENOUS
  Filled 2019-12-28: qty 10

## 2019-12-28 MED ORDER — ASPIRIN 81 MG PO TBEC: 81.0000 mg | DELAYED_RELEASE_TABLET | Freq: Every day | ORAL | 1 refills | Status: DC

## 2019-12-28 MED ORDER — PRASUGREL HCL 10 MG PO TABS: 10.0000 mg | ORAL_TABLET | Freq: Every day | ORAL | 2 refills | Status: DC

## 2019-12-28 NOTE — Discharge Instructions (Addendum)
Information about your medication:  Beta Blocker (Heart rate/Blood Pressure-lowering agent)  Generic Name (Brand): metoprolol tartrate (Lopressor), metoprolol succinate (Toprol XL), carvedilol (Coreg), Bisoprolol (Zebeta)  PURPOSE: You are taking this medication to lower blood pressure and heart rate to help prevent further damage to your heart and to reduce your risk of death following your cardiac event.   Common SIDE EFFECTS you may experience include: fatigue, dizziness, diarrhea, or nausea. This medication may also mask the signs of hypoglycemia  Take your medication exactly as prescribed.   Information about your medication: Statin (cholesterol-lowering agent)  Generic Name (Brand): atorvastatin (Lipitor), pravastatin (Pravachol), rosuvastatin (Crestor), simvastatin (Zocor)  PURPOSE: You are taking this medication to lower your "bad" cholesterol (LDL) and to prevent heart attacks and strokes. Statins can also raise your "good" cholesterol (HDL).  Common SIDE EFFECTS you may experience include: muscle pain or weakness (especially in the legs) and upset stomach.  Take your medication exactly as prescribed. Do not eat large amounts of grapefruit or grapefruit juice while taking this medication.  Contact your health care provider if you experience: severe muscle pain that does not improve, dark urine, or yellowing of your skin or eyes. ----------------------------------------------------------------------------------------------------------------------  Contact your health care provider if you experience: severely lower bllod pressure, syncope, palpitations, or chest pain muscle ----------------------------------------------------------------------------------------------------------------------  Information about your medication: Effient (anti-platelet agent)  Generic Name (Brand): prasugrel (Effient), once daily medication  PURPOSE: You are taking this medication along with aspirin  to lower your chance of having a heart attack, stroke, or blood clots in your heart stent. These can be fatal. Brilinta and aspirin help prevent platelets from sticking together and forming a clot that can block an artery or your stent.   Common SIDE EFFECTS you may experience include: bruising or bleeding more easily, shortness of breath  Do not stop taking EFFIENT without talking to the doctor who prescribes it for you. People who are treated with a stent and stop taking Effient too soon, have a higher risk of getting a blood clot in the stent, having a heart attack, or dying. If you stop Effient because of bleeding, or for other reasons, your risk of a heart attack or stroke may increase.   Tell all of your doctors and dentists that you are taking Effient. They should talk to the doctor who prescribed Brilinta for you before you have any surgery or invasive procedure.   Contact your health care provider if you experience: severe or uncontrollable bleeding, pink/red/brown urine, vomiting blood or vomit that looks like "coffee grounds", red or black stools (looks like tar), coughing up blood or blood clots ----------------------------------------------------------------------------------------------------------------------

## 2019-12-28 NOTE — Progress Notes (Signed)
CARDIAC REHAB PHASE I   PRE:  Rate/Rhythm: 73 SR  BP:  Sitting: 137/72      SaO2: 98 RA  MODE:  Ambulation: 300 ft   POST:  Rate/Rhythm: 95 SR  BP:  Sitting: 157/90    SaO2: 97 RA  Pt ambulated 357ft in hallway independently with steady gait. Pt denies CP, visibly some SOB. Pt states mobility limited due to his knees. Pt educated on importance of ASA, Effient and NTG. Pt states statins have given him muscle cramps in the past. Pt given MI book and heart healthy diet. Reviewed restrictions, site care, and exercise guidelines. Echo at bedside. Will refer to CRP II [place]. Pt is interested in participating in Virtual Cardiac and Pulmonary Rehab. Pt advised that Virtual Cardiac and Pulmonary Rehab is provided at no cost to the patient.  Checklist:  1. Pt has smart device  ie smartphone and/or ipad for downloading an app  Yes 2. Reliable internet/wifi service    Yes 3. Understands how to use their smartphone and navigate within an app.  Yes  Pt verbalized understanding and is in agreement.   [id number] Rufina Falco, RN BSN 12/28/2019 8:56 AM

## 2019-12-28 NOTE — Progress Notes (Signed)
  Echocardiogram 2D Echocardiogram has been performed.  Allen [name] 12/28/2019, 9:44 AM

## 2019-12-28 NOTE — Progress Notes (Signed)
NURSING PROGRESS NOTE  Allen [name] [id number] Discharge Data: 12/28/2019 1:58 PM Attending Provider: Lelon Perla, MD [id number], Aundra Millet, MD     [name] to be D/C'd Home per MD order.  Discussed with the patient the After Visit Summary and all questions fully answered. All IV's discontinued with no bleeding noted. All belongings returned to patient for patient to take home. Pt taken downstairs via wheelchair accompanied by staff.   Last Vital Signs:  Blood pressure (!) 115/57, pulse 69, temperature 98.7 F (37.1 C), temperature source Oral, resp. rate 19, height 6' (1.829 m), weight 128 kg, SpO2 95 %.  Discharge Medication List Allergies as of 12/28/2019      Reactions   Sulfa Antibiotics Hives   Advil [ibuprofen] Swelling, Other (See Comments)   Lip swelling   Atorvastatin    Muscle pain   Penicillins Hives, Other (See Comments)   Has patient had a PCN reaction causing immediate rash, facial/tongue/throat swelling, SOB or lightheadedness with hypotension: no Has patient had a PCN reaction causing severe rash involving mucus membranes or skin necrosis: no Has patient had a PCN reaction that required hospitalization no - childhood reaction Has patient had a PCN reaction occurring within the last 10 years: no - childhood reaction If all of the above answers are "NO", then may proceed with Cephalosporin use.   Sulfonamide Derivatives Hives      Medication List    STOP taking these medications   atorvastatin 20 MG tablet Commonly known as: LIPITOR     TAKE these medications   albuterol 108 (90 Base) MCG/ACT inhaler Commonly known as: VENTOLIN HFA TAKE 2 PUFFS BY MOUTH EVERY 6 HOURS AS NEEDED FOR WHEEZE OR SHORTNESS OF BREATH What changed: See the new instructions.   aspirin 81 MG EC tablet Take 1 tablet (81 mg total) by mouth daily. Start taking on: December 29, 2019   buPROPion 150 MG 24 hr tablet Commonly known as: WELLBUTRIN XL TAKE 1 TABLET BY MOUTH  EVERY DAY   clonazePAM 0.5 MG tablet Commonly known as: KLONOPIN TAKE 1 TABLET BY MOUTH TWICE A DAY AS NEEDED FOR ANXIETY What changed: See the new instructions.   furosemide 40 MG tablet Commonly known as: LASIX TAKE 1 TABLET BY MOUTH EVERY DAY   metoprolol tartrate 25 MG tablet Commonly known as: LOPRESSOR Take 0.5 tablets (12.5 mg total) by mouth 2 (two) times daily.   nitroGLYCERIN 0.4 MG SL tablet Commonly known as: NITROSTAT Place 1 tablet (0.4 mg total) under the tongue every 5 (five) minutes as needed for chest pain.   olmesartan-hydrochlorothiazide 20-12.5 MG tablet Commonly known as: BENICAR HCT TAKE 1 TABLET BY MOUTH EVERY DAY   prasugrel 10 MG Tabs tablet Commonly known as: EFFIENT Take 1 tablet (10 mg total) by mouth daily. Start taking on: December 29, 2019   rosuvastatin 10 MG tablet Commonly known as: CRESTOR Take 1 tablet (10 mg total) by mouth daily at 6 PM.   Symbicort 80-4.5 MCG/ACT inhaler Generic drug: budesonide-formoterol TAKE 2 PUFFS BY MOUTH TWICE A DAY What changed: See the new instructions.

## 2019-12-28 NOTE — Discharge Summary (Addendum)
Discharge Summary    Patient ID: Allen [name],  MRN: [id number], DOB/AGE: 72-01-1946 72 y.o.  Admit date: 12/26/2019 Discharge date: 12/28/2019  Primary Care Provider: [name] Primary Cardiologist: Quay Burow, MD  Discharge Diagnoses    Principal Problem:   NSTEMI (non-ST elevated myocardial infarction) [facility]) Active Problems:   Hyperlipidemia   Essential hypertension   COPD (chronic obstructive pulmonary disease) with emphysema ([place])   Status post coronary artery stent placement   Allergies Allergies  Allergen Reactions   Sulfa Antibiotics Hives   Advil [Ibuprofen] Swelling and Other (See Comments)    Lip swelling   Atorvastatin     Muscle pain   Penicillins Hives and Other (See Comments)    Has patient had a PCN reaction causing immediate rash, facial/tongue/throat swelling, SOB or lightheadedness with hypotension: no Has patient had a PCN reaction causing severe rash involving mucus membranes or skin necrosis: no Has patient had a PCN reaction that required hospitalization no - childhood reaction Has patient had a PCN reaction occurring within the last 10 years: no - childhood reaction If all of the above answers are "NO", then may proceed with Cephalosporin use.    Sulfonamide Derivatives Hives    Diagnostic Studies/Procedures    Cath: 12/27/19  Conclusions: Three vessel coronary artery disease, including 40-50% mid LAD stenosis, sequential 40% and 80% proximal and mid LCx lesions, and chronic total occlusion of the mid RCA with left-to-right collaterals via the apical LAD. Low normal left ventricular systolic function with inferior hypokinesis. Moderately elevated left ventricular filling pressure (LVEDP 25-30 mmHg). Successful PCI to mid LCx using Resolute Onyx 2.5 x 18 mm drug-eluting stent with 0% residual stenosis and TIMI-3 flow.   Recommendations: Dual antiplatelet therapy with aspirin and prasugrel for at least 12 months. Medical  therapy of LAD, proximal LCx, and RCA disease. Aggressive secondary prevention, including trial of rosuvastatin with target LDL < 70. Furosemide 20 mg IV x 1, with further dosing based on urine output and symptoms.   Nelva Bush, MD [facility]  Diagnostic Dominance: Right  Intervention   Echo: 12/28/19  IMPRESSIONS     1. Left ventricular ejection fraction, by estimation, is 55 to 60%. The  left ventricle has normal function. The left ventricle has no regional  wall motion abnormalities. Left ventricular diastolic parameters were  normal.   2. Right ventricular systolic function is normal. The right ventricular  size is normal.   3. The mitral valve is normal in structure. No evidence of mitral valve  regurgitation. No evidence of mitral stenosis.   4. The aortic valve is normal in structure. Aortic valve regurgitation is  not visualized. No aortic stenosis is present.   5. The inferior vena cava is normal in size with greater than 50%  respiratory variability, suggesting right atrial pressure of 3 mmHg.  _____________   History of Present Illness     74 yo male with PMH of COPD, former tobacco use, HTN, HL and nonobstructive CAD who presented with chest pain. Allen [name] had the acute onset of chest pain at around 9pm the day of admission. The pain was left sided, non exertional, and not associated with dyspnea or diaphoresis. He came to the ED for further evaluation. Pain was still present on arrival, but mild.  ECG demonstrated sinus rhythm without evidence of ischemia.  Labs were remarkable for Troponin 414.  Cr 1.1.  CXR unremarkable.  Given elevated troponin, patient was started on IV heparin  and admitted for NSTEMI.  Hospital Course     1. NSTEMI: had been having intermittent episodes of chest pain for quite some time but episode that brought him to the ED was different. Had a stress test back in 2018 that showed mild ischemia in the RCA which was reflective of his  moderate RCA disease int he past. Elected for forego cath at that time. hsTn 414>>424. Underwent cardiac cath noted above with chronic total occlusion of the mRCA with left to right collaterals. Culprit lesion felt to be 80% p/mLCx with PCI/DESx1. Placed on DAPT with ASA/Effient for at least one year.   2. HTN: blood pressures were elevated this morning but ARB/HCTZ combo was held with cath. Will continue with metoprolol and resume home ARB/HCTZ today.    3. HL: has been tried on atorvastatin in the past and developed myalgias. Will try Crestor 10mg , if unable to tolerate or reach goal will need to consider PCSK9s.  -- LDL 146   4. COPD: stable on exam  General: Well developed, well nourished, male appearing in no acute distress. Head: Normocephalic, atraumatic.  Neck: Supple without bruits, JVD. Lungs:  Resp regular and unlabored, CTA. Heart: RRR, S1, S2, no S3, S4, or murmur; no rub. Abdomen: Soft, non-tender, non-distended with normoactive bowel sounds. No hepatomegaly. No rebound/guarding. No obvious abdominal masses. Extremities: No clubbing, cyanosis, edema. Distal pedal pulses are 2+ bilaterally. Right radial cath site stable without bruising or hematoma Neuro: Alert and oriented X 3. Moves all extremities spontaneously. Psych: Normal affect.  Allen [name] was seen by Dr. Claiborne Billings and determined stable for discharge home. Follow up in the office has been arranged. Medications are listed below.   _____________  Discharge Vitals Blood pressure (!) 115/57, pulse 69, temperature 98.7 F (37.1 C), temperature source Oral, resp. rate 19, height 6' (1.829 m), weight 128 kg, SpO2 95 %.  Filed Weights   12/27/19 0138 12/27/19 0227 12/28/19 0559  Weight: 123.8 kg 130.7 kg 128 kg    Labs & Radiologic Studies    CBC Recent Labs    12/26/19 2324 12/28/19 0328  WBC 8.4 11.0*  HGB 14.7 14.6  HCT 46.1 44.8  MCV 89.0 86.8  PLT 256 [id number]   Basic Metabolic Panel Recent Labs     12/27/19 0928 12/28/19 0328  NA 136 138  K 4.1 4.6  CL 103 99  CO2 22 27  GLUCOSE 123* 124*  BUN 19 14  CREATININE 1.01 1.20  CALCIUM 9.0 9.6   Liver Function Tests No results for input(s): AST, ALT, ALKPHOS, BILITOT, PROT, ALBUMIN in the last 72 hours. No results for input(s): LIPASE, AMYLASE in the last 72 hours. Cardiac Enzymes No results for input(s): CKTOTAL, CKMB, CKMBINDEX, TROPONINI in the last 72 hours. BNP Invalid input(s): POCBNP D-Dimer No results for input(s): DDIMER in the last 72 hours. Hemoglobin A1C No results for input(s): HGBA1C in the last 72 hours. Fasting Lipid Panel Recent Labs    12/27/19 0928  CHOL 243*  HDL 39*  LDLCALC 146*  TRIG 288*  CHOLHDL 6.2   Thyroid Function Tests No results for input(s): TSH, T4TOTAL, T3FREE, THYROIDAB in the last 72 hours.  Invalid input(s): FREET3 _____________  DG Chest 2 View  Result Date: 12/27/2019 CLINICAL DATA:  Chest pain EXAM: CHEST - 2 VIEW COMPARISON:  April 26, 2014 FINDINGS: There is a right infrahilar airspace opacity which is essentially stable from 2015 and is of doubtful clinical significance. The heart size is stable from  prior study. There is no pneumothorax. No large pleural effusion. No acute displaced fracture. Degenerative changes are noted of the thoracic spine. Atherosclerotic changes are noted of the thoracic aorta. IMPRESSION: No active cardiopulmonary disease. Electronically Signed   By: Constance Holster M.D.   On: 12/27/2019 00:25   CARDIAC CATHETERIZATION  Result Date: 12/27/2019 Conclusions: 1. Three vessel coronary artery disease, including 40-50% mid LAD stenosis, sequential 40% and 80% proximal and mid LCx lesions, and chronic total occlusion of the mid RCA with left-to-right collaterals via the apical LAD. 2. Low normal left ventricular systolic function with inferior hypokinesis. 3. Moderately elevated left ventricular filling pressure (LVEDP 25-30 mmHg). 4. Successful PCI to mid LCx  using Resolute Onyx 2.5 x 18 mm drug-eluting stent with 0% residual stenosis and TIMI-3 flow. Recommendations: 1. Dual antiplatelet therapy with aspirin and prasugrel for at least 12 months. 2. Medical therapy of LAD, proximal LCx, and RCA disease. 3. Aggressive secondary prevention, including trial of rosuvastatin with target LDL < 70. 4. Furosemide 20 mg IV x 1, with further dosing based on urine output and symptoms. Nelva Bush, MD [facility]   ECHOCARDIOGRAM COMPLETE  Result Date: 12/28/2019    ECHOCARDIOGRAM REPORT   Patient Name:   Allen [name] Date of Exam: 12/28/2019 Medical Rec #:  [id number]       Height:       72.0 in Accession #:    [id number]      Weight:       282.1 lb Date of Birth:  1946/05/03      BSA:          2.466 m Patient Age:    49 years        BP:           115/57 mmHg Patient Gender: M               HR:           73 bpm. Exam Location:  Inpatient Procedure: 2D Echo, Cardiac Doppler, Color Doppler and Intracardiac            Opacification Agent Indications:    Acute myocardial infarction 410  History:        Patient has prior history of Echocardiogram examinations, most                 recent 12/02/2018. CAD, COPD, Arrythmias:non-specific ST changes,                 Signs/Symptoms:Chest Pain; Risk Factors:Dyslipidemia,                 Hypertension and Former Smoker.  Sonographer:    Vickie Epley RDCS Referring Phys: [phone] CHRISTOP[facility] END IMPRESSIONS  1. Left ventricular ejection fraction, by estimation, is 55 to 60%. The left ventricle has normal function. The left ventricle has no regional wall motion abnormalities. Left ventricular diastolic parameters were normal.  2. Right ventricular systolic function is normal. The right ventricular size is normal.  3. The mitral valve is normal in structure. No evidence of mitral valve regurgitation. No evidence of mitral stenosis.  4. The aortic valve is normal in structure. Aortic valve regurgitation is not visualized. No aortic stenosis is  present.  5. The inferior vena cava is normal in size with greater than 50% respiratory variability, suggesting right atrial pressure of 3 mmHg. Comparison(s): No significant change from prior study. Prior images reviewed side by side. FINDINGS  Left Ventricle: Left ventricular ejection fraction, by  estimation, is 55 to 60%. The left ventricle has normal function. The left ventricle has no regional wall motion abnormalities. Definity contrast agent was given IV to delineate the left ventricular  endocardial borders. The left ventricular internal cavity size was normal in size. There is no left ventricular hypertrophy. Left ventricular diastolic parameters were normal. Right Ventricle: The right ventricular size is normal. No increase in right ventricular wall thickness. Right ventricular systolic function is normal. Left Atrium: Left atrial size was normal in size. Right Atrium: Right atrial size was normal in size. Pericardium: There is no evidence of pericardial effusion. Mitral Valve: The mitral valve is normal in structure. Normal mobility of the mitral valve leaflets. No evidence of mitral valve regurgitation. No evidence of mitral valve stenosis. Tricuspid Valve: The tricuspid valve is normal in structure. Tricuspid valve regurgitation is not demonstrated. No evidence of tricuspid stenosis. Aortic Valve: The aortic valve is normal in structure. Aortic valve regurgitation is not visualized. No aortic stenosis is present. Pulmonic Valve: The pulmonic valve was normal in structure. Pulmonic valve regurgitation is not visualized. No evidence of pulmonic stenosis. Aorta: The aortic root is normal in size and structure. Venous: The inferior vena cava is normal in size with greater than 50% respiratory variability, suggesting right atrial pressure of 3 mmHg. IAS/Shunts: No atrial level shunt detected by color flow Doppler.  LEFT VENTRICLE PLAX 2D LVIDd:         5.40 cm      Diastology LVIDs:         3.40 cm      LV  e' lateral:   7.95 cm/s LV PW:         0.80 cm      LV E/e' lateral: 9.0 LV IVS:        0.80 cm      LV e' medial:    5.84 cm/s LVOT diam:     2.50 cm      LV E/e' medial:  12.3 LV SV:         97 LV SV Index:   39 LVOT Area:     4.91 cm  LV Volumes (MOD) LV vol d, MOD A2C: 146.0 ml LV vol d, MOD A4C: 181.0 ml LV vol s, MOD A2C: 66.7 ml LV vol s, MOD A4C: 72.7 ml LV SV MOD A2C:     79.3 ml LV SV MOD A4C:     181.0 ml LV SV MOD BP:      92.4 ml RIGHT VENTRICLE RV S prime:     12.20 cm/s TAPSE (M-mode): 2.1 cm LEFT ATRIUM             Index       RIGHT ATRIUM           Index LA diam:        4.00 cm 1.62 cm/m  RA Area:     14.80 cm LA Vol (A2C):   46.9 ml 19.02 ml/m RA Volume:   35.70 ml  14.48 ml/m LA Vol (A4C):   37.0 ml 15.00 ml/m LA Biplane Vol: 42.2 ml 17.11 ml/m  AORTIC VALVE LVOT Vmax:   95.30 cm/s LVOT Vmean:  70.600 cm/s LVOT VTI:    0.198 m  AORTA Ao Root diam: 3.30 cm MITRAL VALVE MV Area (PHT): 3.91 cm    SHUNTS MV Decel Time: 194 msec    Systemic VTI:  0.20 m MV E velocity: 71.80 cm/s  Systemic Diam: 2.50 cm MV A velocity:  89.70 cm/s MV E/A ratio:  0.80 Mihai Croitoru MD Electronically signed by Sanda Klein MD Signature Date/Time: 12/28/2019/1:20:24 PM    Final    Disposition   Pt is being discharged home today in good condition.  Follow-up Plans & Appointments    Follow-up Information     Abigail Butts., PA-C Follow up on 01/11/2020.   Specialty: Physician Assistant Why: at 3:15pm for your follow up appt.  Contact information: [address] [place] [address] [phone]           Discharge Instructions     Amb Referral to Cardiac Rehabilitation   Complete by: As directed    Diagnosis:  Coronary Stents NSTEMI     After initial evaluation and assessments completed: Virtual Based Care may be provided alone or in conjunction with Phase 2 Cardiac Rehab based on patient barriers.: Yes   Call MD for:  redness, tenderness, or signs of infection (pain,  swelling, redness, odor or green/yellow discharge around incision site)   Complete by: As directed    Diet - low sodium heart healthy   Complete by: As directed    Discharge instructions   Complete by: As directed    Radial Site Care Refer to this sheet in the next few weeks. These instructions provide you with information on caring for yourself after your procedure. Your caregiver may also give you more specific instructions. Your treatment has been planned according to current medical practices, but problems sometimes occur. Call your caregiver if you have any problems or questions after your procedure. HOME CARE INSTRUCTIONS You may shower the day after the procedure. Remove the bandage (dressing) and gently wash the site with plain soap and water. Gently pat the site dry.  Do not apply powder or lotion to the site.  Do not submerge the affected site in water for 3 to 5 days.  Inspect the site at least twice daily.  Do not flex or bend the affected arm for 24 hours.  No lifting over 5 pounds (2.3 kg) for 5 days after your procedure.  Do not drive home if you are discharged the same day of the procedure. Have someone else drive you.  You may drive 24 hours after the procedure unless otherwise instructed by your caregiver.  What to expect: Any bruising will usually fade within 1 to 2 weeks.  Blood that collects in the tissue (hematoma) may be painful to the touch. It should usually decrease in size and tenderness within 1 to 2 weeks.  SEEK IMMEDIATE MEDICAL CARE IF: You have unusual pain at the radial site.  You have redness, warmth, swelling, or pain at the radial site.  You have drainage (other than a small amount of blood on the dressing).  You have chills.  You have a fever or persistent symptoms for more than 72 hours.  You have a fever and your symptoms suddenly get worse.  Your arm becomes pale, cool, tingly, or numb.  You have heavy bleeding from the site. Hold pressure on the  site.   PLEASE DO NOT MISS ANY DOSES OF YOUR EFFIENT!!!!! Also keep a log of you blood pressures and bring back to your follow up appt. Please call the office with any questions.   Patients taking blood thinners should generally stay away from medicines like ibuprofen, Advil, Motrin, naproxen, and Aleve due to risk of stomach bleeding. You may take Tylenol as directed or talk to your primary doctor about alternatives.  Increase activity slowly   Complete by: As directed         Discharge Medications     Medication List     STOP taking these medications    atorvastatin 20 MG tablet Commonly known as: LIPITOR       TAKE these medications    albuterol 108 (90 Base) MCG/ACT inhaler Commonly known as: VENTOLIN HFA TAKE 2 PUFFS BY MOUTH EVERY 6 HOURS AS NEEDED FOR WHEEZE OR SHORTNESS OF BREATH What changed: See the new instructions.   aspirin 81 MG EC tablet Take 1 tablet (81 mg total) by mouth daily. Start taking on: December 29, 2019   buPROPion 150 MG 24 hr tablet Commonly known as: WELLBUTRIN XL TAKE 1 TABLET BY MOUTH EVERY DAY   clonazePAM 0.5 MG tablet Commonly known as: KLONOPIN TAKE 1 TABLET BY MOUTH TWICE A DAY AS NEEDED FOR ANXIETY What changed: See the new instructions.   furosemide 40 MG tablet Commonly known as: LASIX TAKE 1 TABLET BY MOUTH EVERY DAY   metoprolol tartrate 25 MG tablet Commonly known as: LOPRESSOR Take 0.5 tablets (12.5 mg total) by mouth 2 (two) times daily.   nitroGLYCERIN 0.4 MG SL tablet Commonly known as: NITROSTAT Place 1 tablet (0.4 mg total) under the tongue every 5 (five) minutes as needed for chest pain.   olmesartan-hydrochlorothiazide 20-12.5 MG tablet Commonly known as: BENICAR HCT TAKE 1 TABLET BY MOUTH EVERY DAY   prasugrel 10 MG Tabs tablet Commonly known as: EFFIENT Take 1 tablet (10 mg total) by mouth daily. Start taking on: December 29, 2019   rosuvastatin 10 MG tablet Commonly known as: CRESTOR Take 1 tablet  (10 mg total) by mouth daily at 6 PM.   Symbicort 80-4.5 MCG/ACT inhaler Generic drug: budesonide-formoterol TAKE 2 PUFFS BY MOUTH TWICE A DAY What changed: See the new instructions.         Yes                               AHA/ACC Clinical Performance & Quality Measures: Aspirin prescribed? - Yes ADP Receptor Inhibitor (Plavix/Clopidogrel, Brilinta/Ticagrelor or Effient/Prasugrel) prescribed (includes medically managed patients)? - Yes Beta Blocker prescribed? - Yes High Intensity Statin (Lipitor 40-80mg  or Crestor 20-40mg ) prescribed? - No, intolerant in the past. Starting crestor 10mg   EF assessed during THIS hospitalization? - Yes For EF <40%, was ACEI/ARB prescribed? - Not Applicable (EF >/= [id number]) For EF <40%, Aldosterone Antagonist (Spironolactone or Eplerenone) prescribed? - Not Applicable (EF >/= [id number]) Cardiac Rehab Phase II ordered (Included Medically managed Patients)? - Yes      Outstanding Labs/Studies   FLP/LFTs in 8 weeks.   Duration of Discharge Encounter   Greater than 30 minutes including physician time.  Signed, Reino Bellis NP-C 12/28/2019, 1:45 PM   Patient seen and examined. Agree with assessment and plan.  Angiograms reviewed in the Cath Lab yesterday with Dr. And.  Patient underwent successful PCI with stenting of his circumflex vessel.  He has total occlusion of his distal RCA with excellent collateralization.  Medical therapy for concomitant CAD.  No recurrent chest pain.  Right radial cath site stable.  Plan discharge today with follow-up with Dr. Gwenlyn Found.  Troy Sine, MD, [facility] 12/28/2019 1:45 PM

## 2019-12-28 NOTE — Plan of Care (Addendum)
  Problem: Clinical Measurements: Goal: Ability to maintain clinical measurements within normal limits will improve Outcome: Completed/Met Goal: Will remain free from infection Outcome: Completed/Met   Problem: Education: Goal: Understanding of CV disease, CV risk reduction, and recovery process will improve Outcome: Completed/Met Goal: Individualized Educational Video(s) Outcome: Completed/Met   Problem: Cardiovascular: Goal: Vascular access site(s) Level 0-1 will be maintained Outcome: Completed/Met   Problem: Health Behavior/Discharge Planning: Goal: Ability to safely manage health-related needs after discharge will improve Outcome: Completed/Met

## 2019-12-29 ENCOUNTER — Telehealth (HOSPITAL_COMMUNITY): Payer: Self-pay

## 2019-12-29 NOTE — Telephone Encounter (Signed)
Pt insurance is active and benefits verified through [facility] Co-pay 0, DED $300/0 met, out of pocket $3,500/0 met, co-insurance 0%. no pre-authorization required, REF# [id number]  Will contact patient to see if he is interested in the Cardiac Rehab Program. If interested, patient will need to complete follow up appt. Once completed, patient will be contacted for scheduling upon review by the RN Navigator.  Pt has to cover full amount until deductible is met!

## 2019-12-29 NOTE — Telephone Encounter (Signed)
Called patient to see if he is interested in the Cardiac Rehab Program. Patient expressed interest. Explained scheduling process and went over insurance, patient verbalized understanding. Will contact patient for scheduling once f/u has been completed.  °

## 2020-01-01 ENCOUNTER — Other Ambulatory Visit: Payer: Self-pay | Admitting: Family Medicine

## 2020-01-04 ENCOUNTER — Other Ambulatory Visit: Payer: Self-pay | Admitting: Family Medicine

## 2020-01-05 NOTE — Telephone Encounter (Signed)
Last OV 11/09/19 Clonazepam last filled 10/20/19 #30 with 1

## 2020-01-06 ENCOUNTER — Telehealth (INDEPENDENT_AMBULATORY_CARE_PROVIDER_SITE_OTHER): Payer: Medicare HMO | Admitting: Cardiology

## 2020-01-06 ENCOUNTER — Encounter: Payer: Self-pay | Admitting: Cardiology

## 2020-01-06 VITALS — BP 172/103 | HR 71 | Ht 72.0 in | Wt 181.0 lb

## 2020-01-06 DIAGNOSIS — E785 Hyperlipidemia, unspecified: Secondary | ICD-10-CM

## 2020-01-06 DIAGNOSIS — I771 Stricture of artery: Secondary | ICD-10-CM

## 2020-01-06 DIAGNOSIS — I2510 Atherosclerotic heart disease of native coronary artery without angina pectoris: Secondary | ICD-10-CM

## 2020-01-06 DIAGNOSIS — I214 Non-ST elevation (NSTEMI) myocardial infarction: Secondary | ICD-10-CM | POA: Diagnosis not present

## 2020-01-06 DIAGNOSIS — I10 Essential (primary) hypertension: Secondary | ICD-10-CM

## 2020-01-06 DIAGNOSIS — J438 Other emphysema: Secondary | ICD-10-CM

## 2020-01-06 MED ORDER — FUROSEMIDE 20 MG PO TABS: 20.0000 mg | ORAL_TABLET | Freq: Every day | ORAL | 3 refills | Status: DC

## 2020-01-06 NOTE — Patient Instructions (Signed)
Medication Instructions:   Decrease Lasix to 20 mg daily.  *If you need a refill on your cardiac medications before your next appointment, please call your pharmacy*   Lab Work: Your physician recommends that you return for a FASTING lipid profile and CMET in 2 months (May). You do not need an appointment to have labs done in our office. Please bring your lab slips with you when you return.  If you have labs (blood work) drawn today and your tests are completely normal, you will receive your results only by: [name] MyChart Message (if you have MyChart) OR . A paper copy in the mail If you have any lab test that is abnormal or we need to change your treatment, we will call you to review the results.   Follow-Up: At [facility], you and your health needs are our priority.  As part of our continuing mission to provide you with exceptional heart care, we have created designated Provider Care Teams.  These Care Teams include your primary Cardiologist (physician) and Advanced Practice Providers (APPs -  Physician Assistants and Nurse Practitioners) who all work together to provide you with the care you need, when you need it.  We recommend signing up for the patient portal called "MyChart".  Sign up information is provided on this After Visit Summary.  MyChart is used to connect with patients for Virtual Visits (Telemedicine).  Patients are able to view lab/test results, encounter notes, upcoming appointments, etc.  Non-urgent messages can be sent to your provider as well.   To learn more about what you can do with MyChart, go to [email or web address].    Your next appointment:   4 week(s)  The format for your next appointment:   In Person  Provider:   Quay Burow, MD (ONLY)   Other Instructions Our office will call you to schedule your follow-up appointment with Dr. Gwenlyn Found.  Your physician has requested that you regularly monitor and record your blood pressure readings at home. Please  use the same machine at the same time of day to check your readings and record them to bring to your follow-up visit. Please call our office in 1 week with blood pressure readings or send through [place].
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
## 2020-01-06 NOTE — Addendum Note (Signed)
Addended by: Therisa Doyne on: 01/06/2020 02:44 PM   Modules accepted: Orders

## 2020-01-11 ENCOUNTER — Ambulatory Visit: Payer: Medicare HMO | Admitting: Medical

## 2020-01-25 ENCOUNTER — Telehealth: Payer: Self-pay | Admitting: Cardiovascular Disease

## 2020-01-25 NOTE — Telephone Encounter (Signed)
Spoke with patient. Patient was having muscle aches with the Crestor. They were aching so bad he couldn't raise his arms above his head over the weekend. Pharmacist advised he stopped the medication and see how he does. Since stopping Crestor he is improving, patient had similar problems with Simvastatin in the past.   Patient would like to know if there are any other recommendations for medication that he can take.   Will route to MD for review.   [facility] [place] [facility].

## 2020-01-25 NOTE — Telephone Encounter (Signed)
Left message for patient to call back for recommendations from Dr. Gwenlyn Found. Scheduling sent a message for referral to pharmacist / cvrr for PCSK9.

## 2020-01-25 NOTE — Telephone Encounter (Signed)
New Message    Pt c/o medication issue:  1. Name of Medication: rosuvastatin (CRESTOR) 10 MG tablet  2. How are you currently taking this medication (dosage and times per day)? Currently not taking   3. Are you having a reaction (difficulty breathing--STAT)? No   4. What is your medication issue? Pt says he stopped taking this medication because he was having severe muscle aches. He says the pharmacist told him to stop taking the mediation and he wanted to let Dr Gwenlyn Found know    Please call

## 2020-01-25 NOTE — Telephone Encounter (Signed)
Refer to Allen [name] to start PCSK9 Repatha

## 2020-01-25 NOTE — Telephone Encounter (Signed)
You can send a note to scheduling asking them to set him up with a lipid appointment with the Pharmacist/CVRR.  They're good at responding.

## 2020-02-01 NOTE — Telephone Encounter (Signed)
Called patient left message on personal voice mail Dr.Berry advised to pharmacist in lipid clinic.I will send message to schedulers to call you back with that appointment.

## 2020-02-02 ENCOUNTER — Telehealth: Payer: Self-pay | Admitting: Cardiovascular Disease

## 2020-02-02 NOTE — Telephone Encounter (Signed)
Patient stated that he didn't need an appt. with the Pharmacist and his Cholesterol is better now.

## 2020-02-02 NOTE — Telephone Encounter (Signed)
Received a message from patient he did not want to see pharmacist in [facility].His pharmacist is helping him.

## 2020-02-09 ENCOUNTER — Encounter: Payer: Self-pay | Admitting: Cardiovascular Disease

## 2020-02-09 ENCOUNTER — Ambulatory Visit: Payer: Medicare HMO | Admitting: Cardiovascular Disease

## 2020-02-09 ENCOUNTER — Other Ambulatory Visit: Payer: Self-pay

## 2020-02-09 DIAGNOSIS — I10 Essential (primary) hypertension: Secondary | ICD-10-CM

## 2020-02-09 DIAGNOSIS — Z9861 Coronary angioplasty status: Secondary | ICD-10-CM

## 2020-02-09 DIAGNOSIS — E785 Hyperlipidemia, unspecified: Secondary | ICD-10-CM | POA: Diagnosis not present

## 2020-02-09 DIAGNOSIS — I2510 Atherosclerotic heart disease of native coronary artery without angina pectoris: Secondary | ICD-10-CM | POA: Diagnosis not present

## 2020-02-09 NOTE — Assessment & Plan Note (Signed)
>>  ASSESSMENT AND PLAN FOR CAD S/P PCI WRITTEN ON 02/09/2020 11:54 AM BY BERRY, Delton See, MD  History of recent admission for non-STEMI 12/26/2019.  Underwent cardiac catheterization by Dr. Okey Dupre revealing minimal CAD in the LAD, 80% mid AV groove circumflex which was stented with a 2.5 mm x 18 mm long resolute Onyx drug-eluting stent.  He had a total RCA with left-to-right collaterals and preserved ejection fraction with an EF in the 50 to 55% with inferior hypokinesia.  It was elected to treat the RCA medically.  He is on aspirin and prasugrel.  He denies chest pain or shortness of breath.

## 2020-02-09 NOTE — Assessment & Plan Note (Addendum)
History of recent admission for non-STEMI 12/26/2019.  Underwent cardiac catheterization by Dr. Saunders Revel revealing minimal CAD in the LAD, 80% mid AV groove circumflex which was stented with a 2.5 mm x 18 mm long resolute Onyx drug-eluting stent.  He had a total RCA with left-to-right collaterals and preserved ejection fraction with an EF in the 50 to 55% with inferior hypokinesia.  It was elected to treat the RCA medically.  He is on aspirin and prasugrel.  He denies chest pain or shortness of breath.

## 2020-02-09 NOTE — Assessment & Plan Note (Signed)
History of essential hypertension with blood pressure measured today at 102/58.  He is on metoprolol, Benicar and hydrochlorothiazide.

## 2020-02-09 NOTE — Progress Notes (Signed)
02/09/2020 Allen [name]   [birth year]  CN:[id number]  Primary Physician [name], MD Primary Cardiologist: Lorretta Harp MD Allen [name], Allen [name]  HPI:  Allen [name] is a 74 y.o.  mildly overweight married Caucasian male father of 16 (4 biologic, 2 adopted), grandfather of 6 grandchildren who is accompanied by his wife Allen [name] today. He was referred by Dr. Birdie Riddle for cardiovascular evaluation because of chest pain.  I last saw him in the office 12/17/2016.  He has had cardiac catheterizations in the past, one remotely and 1 performed by Dr. Burt Knack 04/26/14 revealing noncritical CAD with normal LV function. His pain at that time was thought to be noncardiac. His problems include remote tobacco abuse having smoked 35-50 pack years and stopped 20 years ago. He also has a History of hypertension and hyperlipidemia. He has never had a heart attack or stroke. He's had chest pain which began a year ago that occurs monthly. He experienced 3 episodes over Christmas which were more worrisome.  I performed Myoview stress testing on him 12/05/2016 that showed mild ischemia in the RCA territory.  Apparently at his previous cath in 2015 he did have moderate RCA disease.  I elected to treat him medically at that time.  He was admitted 12/26/2019 with a non-STEMI and underwent cardiac catheterization the following day by Dr. Saunders Revel revealing minimal disease in the LAD, 80% mid AV groove circumflex with which was stented with a 2.5 mm x 18 mm long Medtronic resolute Onyx drug-eluting stent.  His RCA was occluded with left-to-right collaterals and his EF was preserved in the 50% range with inferior hypokinesia.   Current Meds  Medication Sig  . albuterol (VENTOLIN HFA) 108 (90 Base) MCG/ACT inhaler TAKE 2 PUFFS BY MOUTH EVERY 6 HOURS AS NEEDED FOR WHEEZE OR SHORTNESS OF BREATH (Patient taking differently: Inhale 2 puffs into the lungs every 6 (six) hours as needed for wheezing or shortness of breath. )  .  aspirin EC 81 MG EC tablet Take 1 tablet (81 mg total) by mouth daily.  Allen [name] buPROPion (WELLBUTRIN XL) 150 MG 24 hr tablet TAKE 1 TABLET BY MOUTH EVERY DAY (Patient taking differently: Take 150 mg by mouth daily. )  . clonazePAM (KLONOPIN) 0.5 MG tablet TAKE 1 TABLET BY MOUTH TWICE A DAY AS NEEDED FOR ANXIETY  . furosemide (LASIX) 20 MG tablet Take 1 tablet (20 mg total) by mouth daily.  . metoprolol tartrate (LOPRESSOR) 25 MG tablet Take 0.5 tablets (12.5 mg total) by mouth 2 (two) times daily.  . nitroGLYCERIN (NITROSTAT) 0.4 MG SL tablet Place 1 tablet (0.4 mg total) under the tongue every 5 (five) minutes as needed for chest pain.  Allen [name] olmesartan-hydrochlorothiazide (BENICAR HCT) 20-12.5 MG tablet TAKE 1 TABLET BY MOUTH EVERY DAY  . prasugrel (EFFIENT) 10 MG TABS tablet Take 1 tablet (10 mg total) by mouth daily.  . rosuvastatin (CRESTOR) 10 MG tablet Take 1 tablet (10 mg total) by mouth daily at 6 PM.  . SYMBICORT 80-4.5 MCG/ACT inhaler TAKE 2 PUFFS BY MOUTH TWICE A DAY (Patient taking differently: Inhale 2 puffs into the lungs in the morning and at bedtime. )     Allergies  Allergen Reactions  . Sulfa Antibiotics Hives  . Advil [Ibuprofen] Swelling and Other (See Comments)    Lip swelling  . Atorvastatin     Muscle pain  . Penicillins Hives and Other (See Comments)    Has patient had a PCN reaction causing  immediate rash, facial/tongue/throat swelling, SOB or lightheadedness with hypotension: no Has patient had a PCN reaction causing severe rash involving mucus membranes or skin necrosis: no Has patient had a PCN reaction that required hospitalization no - childhood reaction Has patient had a PCN reaction occurring within the last 10 years: no - childhood reaction If all of the above answers are "NO", then may proceed with Cephalosporin use.   . Sulfonamide Derivatives Hives    Social History   Socioeconomic History  . Marital status: Married    Spouse name: Not on file  . Number  of children: 6  . Years of education: Not on file  . Highest education level: Not on file  Occupational History  . Occupation: retired  Tobacco Use  . Smoking status: Former Smoker    Packs/day: 2.00    Years: 35.00    Pack years: 70.00    Types: Cigarettes    Quit date: 10/21/1997    Years since quitting: 22.3  . Smokeless tobacco: Never Used  . Tobacco comment: 2-3 ppd  Substance and Sexual Activity  . Alcohol use: No    Alcohol/week: 0.0 standard drinks  . Drug use: No  . Sexual activity: Yes    Birth control/protection: None  Other Topics Concern  . Not on file  Social History Narrative   Married, 6 kids (2 adopted).   Retired Engineer, structural.   Former smoker.  No signif alc.     No drugs.      Social Determinants of Health   Financial Resource Strain:   . Difficulty of Paying Living Expenses:   Food Insecurity:   . Worried About Charity fundraiser in the Last Year:   . Arboriculturist in the Last Year:   Transportation Needs:   . Film/video editor (Medical):   Allen [name] Lack of Transportation (Non-Medical):   Physical Activity:   . Days of Exercise per Week:   . Minutes of Exercise per Session:   Stress:   . Feeling of Stress :   Social Connections:   . Frequency of Communication with Friends and Family:   . Frequency of Social Gatherings with Friends and Family:   . Attends Religious Services:   . Active Member of Clubs or Organizations:   . Attends Archivist Meetings:   Allen [name] Marital Status:   Intimate Partner Violence:   . Fear of Current or Ex-Partner:   . Emotionally Abused:   Allen [name] Physically Abused:   . Sexually Abused:      Review of Systems: General: negative for chills, fever, night sweats or weight changes.  Cardiovascular: negative for chest pain, dyspnea on exertion, edema, orthopnea, palpitations, paroxysmal nocturnal dyspnea or shortness of breath Dermatological: negative for rash Respiratory: negative for cough or wheezing Urologic:  negative for hematuria Abdominal: negative for nausea, vomiting, diarrhea, bright red blood per rectum, melena, or hematemesis Neurologic: negative for visual changes, syncope, or dizziness All other systems reviewed and are otherwise negative except as noted above.    Blood pressure (!) 102/58, pulse 64, height 6' (1.829 m), weight 280 lb (127 kg), SpO2 93 %.  General appearance: alert and no distress Neck: no adenopathy, no carotid bruit, no JVD, supple, symmetrical, trachea midline and thyroid not enlarged, symmetric, no tenderness/mass/nodules Lungs: clear to auscultation bilaterally Heart: regular rate and rhythm, S1, S2 normal, no murmur, click, rub or gallop Extremities: extremities normal, atraumatic, no cyanosis or edema Pulses: 2+ and symmetric Skin: Skin color,  texture, turgor normal. No rashes or lesions Neurologic: Alert and oriented X 3, normal strength and tone. Normal symmetric reflexes. Normal coordination and gait  EKG sinus rhythm at 60 without ST or T wave changes.  I personally reviewed this EKG.  ASSESSMENT AND PLAN:   Dyslipidemia, goal LDL below 70 History of dyslipidemia on Crestor with lipid profile performed 12/27/2019 revealing total cholesterol 243, LDL of 146 and HDL 39.  We will recheck a lipid liver profile in 2 months  Essential hypertension History of essential hypertension with blood pressure measured today at 102/58.  He is on metoprolol, Benicar and hydrochlorothiazide.  CAD S/P PCI History of recent admission for non-STEMI 12/26/2019.  Underwent cardiac catheterization by Dr. Saunders Revel revealing minimal CAD in the LAD, 80% mid AV groove circumflex which was stented with a 2.5 mm x 18 mm long resolute Onyx drug-eluting stent.  He had a total RCA with left-to-right collaterals and preserved ejection fraction with an EF in the 50 to 55% with inferior hypokinesia.  It was elected to treat the RCA medically.  He is on aspirin and prasugrel.  He denies chest pain or  shortness of breath.      Lorretta Harp MD FACP,FACC,FAHA, [facility] 02/09/2020 11:55 AM

## 2020-02-09 NOTE — Assessment & Plan Note (Addendum)
History of dyslipidemia on Crestor with lipid profile performed 12/27/2019 revealing total cholesterol 243, LDL of 146 and HDL 39.  We will recheck a lipid liver profile in 2 months

## 2020-02-09 NOTE — Patient Instructions (Signed)
Medication Instructions:  Your physician recommends that you continue on your current medications as directed. Please refer to the Current Medication list given to you today.  *If you need a refill on your cardiac medications before your next appointment, please call your pharmacy*   Lab Work: Please return for FASTING labs in 2 months (Lipid, Hepatic)  Our in office lab hours are Monday-Friday 8:00-4:00, closed for lunch 12:45-1:45 pm.  No appointment needed.  If you have labs (blood work) drawn today and your tests are completely normal, you will receive your results only by: [name] MyChart Message (if you have MyChart) OR . A paper copy in the mail If you have any lab test that is abnormal or we need to change your treatment, we will call you to review the results.  Follow-Up: At [facility], you and your health needs are our priority.  As part of our continuing mission to provide you with exceptional heart care, we have created designated Provider Care Teams.  These Care Teams include your primary Cardiologist (physician) and Advanced Practice Providers (APPs -  Physician Assistants and Nurse Practitioners) who all work together to provide you with the care you need, when you need it.  We recommend signing up for the patient portal called "MyChart".  Sign up information is provided on this After Visit Summary.  MyChart is used to connect with patients for Virtual Visits (Telemedicine).  Patients are able to view lab/test results, encounter notes, upcoming appointments, etc.  Non-urgent messages can be sent to your provider as well.   To learn more about what you can do with MyChart, go to [email or web address].    Your next appointment:   3 month(s)  The format for your next appointment:   In Person  Provider:    Kerin Ransom, PA-C  Sande Rives, PA-C  Coletta Memos, FNP  6 months with Dr. Gwenlyn Found   Other Instructions You have been referred to cardiac rehab-they will get in  contact with you to get your scheduled

## 2020-02-10 ENCOUNTER — Telehealth (HOSPITAL_COMMUNITY): Payer: Self-pay

## 2020-02-10 NOTE — Telephone Encounter (Signed)
Called patient to see if he was interested in participating in the Cardiac Rehab Program. Patient stated yes. Patient will come in for orientation on 02/29/20 @ 10AM and will attend the 10:45AM exercise class.  Mailed letter

## 2020-02-11 NOTE — Addendum Note (Signed)
Addended by: Hinton Dyer on: 02/11/2020 04:51 PM   Modules accepted: Orders

## 2020-02-22 ENCOUNTER — Other Ambulatory Visit: Payer: Self-pay | Admitting: Cardiology

## 2020-02-23 NOTE — Telephone Encounter (Signed)
This is Dr. Berry's pt 

## 2020-02-28 ENCOUNTER — Telehealth (HOSPITAL_COMMUNITY): Payer: Self-pay

## 2020-02-28 NOTE — Telephone Encounter (Signed)
Cardiac Rehab Note:  Successful telephone encounter to [name] to confirm his cardiac rehab orientation appointment for 02/29/20. Mr. Langenfeld states he is not going to be able to make his 10:00 appointment secondary to having to drive someone to their second Covid 19 vaccination appointment and wishes to reschedule. He is informed that the scheduling team will give him a call tomorrow afternoon to reschedule his orientation appointments. Patient appreciative of appointment cancellation.  Shalise Rosado E. Rollene Rotunda RN, BSN [place]. [facility]  Cardiac and Pulmonary Rehabilitation Phone: [phone] Fax: [phone]

## 2020-02-29 ENCOUNTER — Ambulatory Visit (HOSPITAL_COMMUNITY): Payer: Medicare HMO

## 2020-03-01 ENCOUNTER — Telehealth (HOSPITAL_COMMUNITY): Payer: Self-pay

## 2020-03-01 NOTE — Telephone Encounter (Signed)
Called pt to reschedule his orientation for cardiac rehab, pt did answer, left a VM for pt to call back.

## 2020-03-03 ENCOUNTER — Encounter (HOSPITAL_COMMUNITY): Payer: Self-pay

## 2020-03-06 ENCOUNTER — Ambulatory Visit (HOSPITAL_COMMUNITY): Payer: Medicare HMO

## 2020-03-08 ENCOUNTER — Other Ambulatory Visit: Payer: Self-pay | Admitting: Family Medicine

## 2020-03-08 ENCOUNTER — Ambulatory Visit (HOSPITAL_COMMUNITY): Payer: Medicare HMO

## 2020-03-08 NOTE — Telephone Encounter (Signed)
Last OV 11/09/19 Clonazepam last filled 01/05/20 #30 with 1

## 2020-03-10 ENCOUNTER — Ambulatory Visit (HOSPITAL_COMMUNITY): Payer: Medicare HMO

## 2020-03-13 ENCOUNTER — Ambulatory Visit (HOSPITAL_COMMUNITY): Payer: Medicare HMO

## 2020-03-15 ENCOUNTER — Ambulatory Visit (HOSPITAL_COMMUNITY): Payer: Medicare HMO

## 2020-03-17 ENCOUNTER — Ambulatory Visit (HOSPITAL_COMMUNITY): Payer: Medicare HMO

## 2020-03-17 NOTE — Telephone Encounter (Signed)
No response from pt regarding CR.  Closed referral.  

## 2020-03-22 ENCOUNTER — Ambulatory Visit (HOSPITAL_COMMUNITY): Payer: Medicare HMO

## 2020-03-24 ENCOUNTER — Ambulatory Visit (HOSPITAL_COMMUNITY): Payer: Medicare HMO

## 2020-03-26 ENCOUNTER — Other Ambulatory Visit: Payer: Self-pay | Admitting: Family Medicine

## 2020-03-27 ENCOUNTER — Ambulatory Visit (HOSPITAL_COMMUNITY): Payer: Medicare HMO

## 2020-03-29 ENCOUNTER — Ambulatory Visit (HOSPITAL_COMMUNITY): Payer: Medicare HMO

## 2020-03-31 ENCOUNTER — Ambulatory Visit (HOSPITAL_COMMUNITY): Payer: Medicare HMO

## 2020-04-03 ENCOUNTER — Ambulatory Visit (HOSPITAL_COMMUNITY): Payer: Medicare HMO

## 2020-04-03 ENCOUNTER — Encounter: Payer: Self-pay | Admitting: *Deleted

## 2020-04-05 ENCOUNTER — Ambulatory Visit (HOSPITAL_COMMUNITY): Payer: Medicare HMO

## 2020-04-07 ENCOUNTER — Ambulatory Visit (HOSPITAL_COMMUNITY): Payer: Medicare HMO

## 2020-04-10 ENCOUNTER — Ambulatory Visit (HOSPITAL_COMMUNITY): Payer: Medicare HMO

## 2020-04-12 ENCOUNTER — Ambulatory Visit (HOSPITAL_COMMUNITY): Payer: Medicare HMO

## 2020-04-14 ENCOUNTER — Ambulatory Visit (HOSPITAL_COMMUNITY): Payer: Medicare HMO

## 2020-04-17 ENCOUNTER — Ambulatory Visit (HOSPITAL_COMMUNITY): Payer: Medicare HMO

## 2020-04-19 ENCOUNTER — Ambulatory Visit (HOSPITAL_COMMUNITY): Payer: Medicare HMO

## 2020-04-21 ENCOUNTER — Ambulatory Visit (HOSPITAL_COMMUNITY): Payer: Medicare HMO

## 2020-04-26 ENCOUNTER — Ambulatory Visit (HOSPITAL_COMMUNITY): Payer: Medicare HMO

## 2020-04-28 ENCOUNTER — Ambulatory Visit (HOSPITAL_COMMUNITY): Payer: Medicare HMO

## 2020-05-19 ENCOUNTER — Other Ambulatory Visit: Payer: Self-pay | Admitting: Cardiology

## 2020-05-22 ENCOUNTER — Encounter: Payer: Self-pay | Admitting: Cardiology

## 2020-05-22 ENCOUNTER — Ambulatory Visit: Payer: Medicare HMO | Admitting: Cardiology

## 2020-05-22 ENCOUNTER — Other Ambulatory Visit: Payer: Self-pay

## 2020-05-22 VITALS — BP 114/70 | HR 78 | Ht 72.0 in | Wt 282.6 lb

## 2020-05-22 DIAGNOSIS — I2510 Atherosclerotic heart disease of native coronary artery without angina pectoris: Secondary | ICD-10-CM | POA: Diagnosis not present

## 2020-05-22 DIAGNOSIS — I10 Essential (primary) hypertension: Secondary | ICD-10-CM

## 2020-05-22 DIAGNOSIS — E785 Hyperlipidemia, unspecified: Secondary | ICD-10-CM

## 2020-05-22 DIAGNOSIS — R600 Localized edema: Secondary | ICD-10-CM

## 2020-05-22 DIAGNOSIS — I771 Stricture of artery: Secondary | ICD-10-CM

## 2020-05-22 DIAGNOSIS — Z9861 Coronary angioplasty status: Secondary | ICD-10-CM

## 2020-05-22 DIAGNOSIS — I252 Old myocardial infarction: Secondary | ICD-10-CM | POA: Diagnosis not present

## 2020-05-22 DIAGNOSIS — J438 Other emphysema: Secondary | ICD-10-CM

## 2020-05-22 MED ORDER — NITROGLYCERIN 0.4 MG SL SUBL: 0.4000 mg | SUBLINGUAL_TABLET | SUBLINGUAL | 4 refills | Status: DC | PRN

## 2020-05-22 MED ORDER — FUROSEMIDE 20 MG PO TABS: 20.0000 mg | ORAL_TABLET | Freq: Every day | ORAL | 3 refills | Status: DC

## 2020-05-22 NOTE — Assessment & Plan Note (Signed)
Noted in 2019- asymptomatic- followed by Dr Donzetta Matters

## 2020-05-22 NOTE — Assessment & Plan Note (Signed)
Cath in '01 and '15 showed moderate RCA disease- medical Rx Admitted with NSTEMI 12/27/2019- occluded RCA with L-R collaterals and 80% CFX treated with PCI/DES.  Moderate residual small vessel CAD. LVF normal-EF 55-60%.

## 2020-05-22 NOTE — Assessment & Plan Note (Signed)
Myalgia with Simvastatin- tolerating crestor 10 mg.  His LDL in March (off statin) was 146. Check fasting lipids

## 2020-05-22 NOTE — Assessment & Plan Note (Signed)
Currently he denies sleep study, I will address this again at his next office visit

## 2020-05-22 NOTE — Patient Instructions (Signed)
Medication Instructions:   INCREASE Furosemide to 20 mg daily.   Nitroglycerin 0.4 mg has been sent to your pharmacy---take 1 tablet every 5 minutes as needed for chest pain, max. of 3 doses in 24 hours.  *If you need a refill on your cardiac medications before your next appointment, please call your pharmacy*   Lab Work:  Please have a FASTING LIPID PANEL done at your PCP office.  If you have labs (blood work) drawn today and your tests are completely normal, you will receive your results only by: [name] MyChart Message (if you have MyChart) OR . A paper copy in the mail If you have any lab test that is abnormal or we need to change your treatment, we will call you to review the results.   Referral: You have been referred to Cardiac Rehab. They will call you to schedule an appointment.   Follow-Up: At [facility], you and your health needs are our priority.  As part of our continuing mission to provide you with exceptional heart care, we have created designated Provider Care Teams.  These Care Teams include your primary Cardiologist (physician) and Advanced Practice Providers (APPs -  Physician Assistants and Nurse Practitioners) who all work together to provide you with the care you need, when you need it.  We recommend signing up for the patient portal called "MyChart".  Sign up information is provided on this After Visit Summary.  MyChart is used to connect with patients for Virtual Visits (Telemedicine).  Patients are able to view lab/test results, encounter notes, upcoming appointments, etc.  Non-urgent messages can be sent to your provider as well.   To learn more about what you can do with MyChart, go to [email or web address].    Your next appointment:   3 month(s)  The format for your next appointment:   In Person  Provider:   Kerin Ransom, PA-C

## 2020-05-22 NOTE — Assessment & Plan Note (Signed)
I suggested he take the Lasix 20 mg as directed until the edema improves, he could then try 2-3 weeks PRN. I suggested he watch sodium intake, fluid intake, use support stockings and elevated when possible.

## 2020-05-22 NOTE — Assessment & Plan Note (Signed)
>>  ASSESSMENT AND PLAN FOR CAD S/P PCI WRITTEN ON 05/22/2020  3:31 PM BY Abelino Derrick, PA-C  Cath in '01 and '15 showed moderate RCA disease- medical Rx Admitted with NSTEMI 12/27/2019- occluded RCA with L-R collaterals and 80% CFX treated with PCI/DES.  Moderate residual small vessel CAD. LVF normal-EF 55-60%.
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
## 2020-05-22 NOTE — Assessment & Plan Note (Signed)
Allen [name] 01/2015: FEV1 2.77 (74%), Ratio 68% Not smoking

## 2020-05-22 NOTE — Assessment & Plan Note (Signed)
Normal LVF, no LVH, no diastolic dysfunction by echo March 2021

## 2020-05-22 NOTE — Assessment & Plan Note (Signed)
Admitted 12/27/2019 with NSTEMI

## 2020-05-24 ENCOUNTER — Telehealth: Payer: Self-pay | Admitting: Cardiology

## 2020-05-24 NOTE — Telephone Encounter (Signed)
Spoke to pt who report he is confused about his lasix dose. He report he was previously taking 40 mg and explained to PA that he was cutting them in half because he was urinating a lot. He voiced Lurena Joiner instructed him that he wanted him to continue taking 40 mg due to swelling but prescription was filled for 20 mg.  Will route to PA for clarifications.

## 2020-05-24 NOTE — Telephone Encounter (Signed)
New Message:     Pt said he saw Lurena Joiner on Monday. They discussed cutting back on his Furosemide. He said Lurena Joiner wanted him to stay on the 40 mg. He said when he went to the pharmacy to get it, it was 20 mg. He wants to know is he supposed to take 20 mg or 40 mg ?

## 2020-05-25 NOTE — Telephone Encounter (Signed)
The patient should be taking 20 mg daily- (he told me he was splitting a 20 mg pill).  Kerin Ransom PA-C 05/25/2020 7:43 AM

## 2020-05-26 NOTE — Telephone Encounter (Signed)
Left message to call back  

## 2020-05-26 NOTE — Telephone Encounter (Signed)
Pt updated and verbalized understanding.  

## 2020-05-26 NOTE — Telephone Encounter (Signed)
Patient called back returning Alisha's call  

## 2020-06-20 ENCOUNTER — Telehealth (HOSPITAL_COMMUNITY): Payer: Self-pay | Admitting: Pharmacist

## 2020-06-23 NOTE — Telephone Encounter (Signed)
Cardiac Rehab Medication Review by a Pharmacist  Does the patient  feel that his/her medications are working for him/her?  yes  Has the patient been experiencing any side effects to the medications prescribed?  no  Does the patient measure his/her own blood pressure or blood glucose at home?  Patient has a daughter who is a nurse who checks his blood pressures sometimes. He does not remember what it was last time but nothing too high.    Does the patient have any problems obtaining medications due to transportation or finances?   no  Understanding of regimen: good Understanding of indications: good Potential of compliance: good    Pharmacist Intervention: Spoke with patient at length about his medication regimen. He did not have many questions about his regimen and felt comfortable with it at present. He reports not issues obtaining medications and looks forward to his cardiac rehab appointment.     Cephus Slater, PharmD, [facility] Resident [phone] 06/23/2020 3:50 PM

## 2020-06-27 ENCOUNTER — Ambulatory Visit (HOSPITAL_COMMUNITY): Payer: Medicare HMO

## 2020-06-30 ENCOUNTER — Other Ambulatory Visit: Payer: Self-pay | Admitting: Cardiology

## 2020-06-30 NOTE — Telephone Encounter (Signed)
This is Dr. Berry's pt 

## 2020-07-03 ENCOUNTER — Telehealth (HOSPITAL_COMMUNITY): Payer: Self-pay

## 2020-07-03 ENCOUNTER — Ambulatory Visit (HOSPITAL_COMMUNITY): Payer: Medicare HMO

## 2020-07-03 ENCOUNTER — Other Ambulatory Visit: Payer: Self-pay | Admitting: Family Medicine

## 2020-07-03 NOTE — Telephone Encounter (Signed)
Canceled pt cardiac rehab sessions, pt no showed to cardiac rehab orientation. Will call at a later time to see if pt would like to reschedule.

## 2020-07-05 ENCOUNTER — Telehealth (INDEPENDENT_AMBULATORY_CARE_PROVIDER_SITE_OTHER): Payer: Medicare HMO | Admitting: Family Medicine

## 2020-07-05 ENCOUNTER — Ambulatory Visit (HOSPITAL_COMMUNITY): Payer: Medicare HMO

## 2020-07-05 ENCOUNTER — Other Ambulatory Visit: Payer: Self-pay | Admitting: Family Medicine

## 2020-07-05 ENCOUNTER — Encounter: Payer: Self-pay | Admitting: Family Medicine

## 2020-07-05 ENCOUNTER — Encounter (HOSPITAL_COMMUNITY): Payer: Self-pay

## 2020-07-05 DIAGNOSIS — I10 Essential (primary) hypertension: Secondary | ICD-10-CM

## 2020-07-05 DIAGNOSIS — J438 Other emphysema: Secondary | ICD-10-CM | POA: Diagnosis not present

## 2020-07-05 DIAGNOSIS — E785 Hyperlipidemia, unspecified: Secondary | ICD-10-CM

## 2020-07-05 NOTE — Telephone Encounter (Signed)
Last OV today CLonazepam last filled 03/08/20 #30 with 1

## 2020-07-05 NOTE — Progress Notes (Signed)
Virtual Visit via Video   I connected with patient on 07/05/20 at  9:30 AM EDT by a video enabled telemedicine application and verified that I am speaking with the correct person using two identifiers.  Location patient: Home Location provider: Acupuncturist, Office Persons participating in the virtual visit: Patient, Provider, [place] (Katie N)  I discussed the limitations of evaluation and management by telemedicine and the availability of in person appointments. The patient expressed understanding and agreed to proceed.  Subjective:   HPI:   HTN- chronic problem. On Metoprolol 12.5mg  BID, Olmesartan HCTZ 20/12.5mg  daily.  Was not able to get BP today but BP at cardiology visit on 8/2 was 114/70.  No recent chest pain, SOB above baseline.  Continues to have swelling of legs  Hyperlipidemia- chronic problem, on Crestor 10mg  daily.  Pt is attempting to exercise.  Is down 'in the 270 range' from 290.  No abd pain, no N/V.  COPD- chronic problem, on Symbicort 80/4.5 2 puffs BID and albuterol PRN.  Breathing is easier at the beach.  Breathing is impacted by humidity.  Has not required recent albuterol use.  ROS:   See pertinent positives and negatives per HPI.  Patient Active Problem List   Diagnosis Date Noted  . Status post coronary artery stent placement   . History of non-ST elevation myocardial infarction (NSTEMI) 12/27/2019  . Lower extremity edema 03/26/2019  . Nocturia more than twice per night 11/26/2018  . Subclavian artery stenosis, left ([place]) 01/12/2018  . Morbid obesity ([place]) 02/19/2016  . Left shoulder pain 02/19/2016  . Anxiety state 02/19/2016  . Chronic neck pain 09/17/2015  . Gastric mass - submucsaol - antrum 03/03/2015  . COPD (chronic obstructive pulmonary disease) with emphysema ([place]) 02/07/2015  . Rotator cuff impingement syndrome of left shoulder 08/02/2014  . De Quervain's tenosynovitis, right 05/01/2014  . Thyromegaly 05/01/2014  . Knee sprain and  strain 02/17/2013  . Health maintenance examination 08/08/2011  . Chest pain 08/08/2011  . Prostate cancer screening 08/08/2011  . DEPRESSION 12/27/2010  . Unspecified hearing loss 04/19/2010  . Dyslipidemia, goal LDL below 70 08/21/2007  . Essential hypertension 08/21/2007  . CAD S/P PCI 08/21/2007  . GERD 08/21/2007    Social History   Tobacco Use  . Smoking status: Former Smoker    Packs/day: 2.00    Years: 35.00    Pack years: 70.00    Types: Cigarettes    Quit date: 10/21/1997    Years since quitting: 22.7  . Smokeless tobacco: Never Used  . Tobacco comment: 2-3 ppd  Substance Use Topics  . Alcohol use: No    Alcohol/week: 0.0 standard drinks    Current Outpatient Medications:  .  albuterol (VENTOLIN HFA) 108 (90 Base) MCG/ACT inhaler, TAKE 2 PUFFS BY MOUTH EVERY 6 HOURS AS NEEDED FOR WHEEZE OR SHORTNESS OF BREATH (Patient taking differently: Inhale 2 puffs into the lungs every 6 (six) hours as needed for wheezing or shortness of breath. ), Disp: 18 g, Rfl: 2 .  aspirin EC 81 MG EC tablet, Take 1 tablet (81 mg total) by mouth daily., Disp: 90 tablet, Rfl: 1 .  buPROPion (WELLBUTRIN XL) 150 MG 24 hr tablet, TAKE 1 TABLET BY MOUTH EVERY DAY (Patient taking differently: Take 150 mg by mouth daily. ), Disp: 90 tablet, Rfl: 1 .  clonazePAM (KLONOPIN) 0.5 MG tablet, TAKE 1 TABLET BY MOUTH TWICE A DAY AS NEEDED FOR ANXIETY, Disp: 30 tablet, Rfl: 1 .  furosemide (LASIX) 20  MG tablet, Take 1 tablet (20 mg total) by mouth daily., Disp: 30 tablet, Rfl: 3 .  metoprolol tartrate (LOPRESSOR) 25 MG tablet, TAKE 0.5 TABLETS (12.5 MG TOTAL) BY MOUTH 2 (TWO) TIMES DAILY., Disp: 90 tablet, Rfl: 2 .  nitroGLYCERIN (NITROSTAT) 0.4 MG SL tablet, Place 1 tablet (0.4 mg total) under the tongue every 5 (five) minutes as needed for chest pain., Disp: 25 tablet, Rfl: 4 .  olmesartan-hydrochlorothiazide (BENICAR HCT) 20-12.5 MG tablet, TAKE 1 TABLET BY MOUTH EVERY DAY, Disp: 90 tablet, Rfl: 0 .   prasugrel (EFFIENT) 10 MG TABS tablet, Take 1 tablet (10 mg total) by mouth daily., Disp: 90 tablet, Rfl: 2 .  rosuvastatin (CRESTOR) 10 MG tablet, TAKE 1 TABLET (10 MG TOTAL) BY MOUTH DAILY AT 6 PM., Disp: 90 tablet, Rfl: 3 .  SYMBICORT 80-4.5 MCG/ACT inhaler, TAKE 2 PUFFS BY MOUTH TWICE A DAY (Patient taking differently: Inhale 2 puffs into the lungs in the morning and at bedtime. ), Disp: 10.2 Inhaler, Rfl: 6  Allergies  Allergen Reactions  . Sulfa Antibiotics Hives  . Advil [Ibuprofen] Swelling and Other (See Comments)    Lip swelling  . Atorvastatin     Muscle pain  . Penicillins Hives and Other (See Comments)    Has patient had a PCN reaction causing immediate rash, facial/tongue/throat swelling, SOB or lightheadedness with hypotension: no Has patient had a PCN reaction causing severe rash involving mucus membranes or skin necrosis: no Has patient had a PCN reaction that required hospitalization no - childhood reaction Has patient had a PCN reaction occurring within the last 10 years: no - childhood reaction If all of the above answers are "NO", then may proceed with Cephalosporin use.   . Sulfonamide Derivatives Hives    Objective:   There were no vitals taken for this visit. AAOx3, NAD Obese NCAT, EOMI No obvious CN deficits Coloring WNL Pt is able to speak clearly, coherently without shortness of breath or increased work of breathing.  Thought process is linear.  Mood is appropriate.   Assessment and Plan:   HTN- chronic problem, unable to get BP reading today but it was excellent at recent cardiology visit.  Doing well on Metoprolol 12.5mg  BID and Olmesartan HCTZ 20/12.5mg  daily.  Will get labs but no anticipated med changes at this time.  Hyperlipidema- chronic problem.  On Crestor 10mg  nightly.  Attempting to work on diet and exercise and is starting to lose weight.  Applauded his efforts.  Check labs.  Adjust meds prn   COPD- chronic problem.  Pt reports he has not  had to use his rescue inhaler recently despite difficulty w/ the late summer humidity.  He feels he is managing well on his Symbicort 80/4.5 2 puffs BID.  Notes breathing improves when he is at the beach and spends as much time there as possible.  Will follow.  Annye Asa, MD 07/05/2020

## 2020-07-05 NOTE — Telephone Encounter (Signed)
Attempted to call patient in regards to Cardiac Rehab - LM on VM Mailed letter 

## 2020-07-07 ENCOUNTER — Ambulatory Visit (HOSPITAL_COMMUNITY): Payer: Medicare HMO

## 2020-07-10 ENCOUNTER — Ambulatory Visit (HOSPITAL_COMMUNITY): Payer: Medicare HMO

## 2020-07-12 ENCOUNTER — Ambulatory Visit (HOSPITAL_COMMUNITY): Payer: Medicare HMO

## 2020-07-12 ENCOUNTER — Other Ambulatory Visit: Payer: Self-pay | Admitting: Cardiology

## 2020-07-14 ENCOUNTER — Ambulatory Visit (HOSPITAL_COMMUNITY): Payer: Medicare HMO

## 2020-07-17 ENCOUNTER — Ambulatory Visit (HOSPITAL_COMMUNITY): Payer: Medicare HMO

## 2020-07-19 ENCOUNTER — Other Ambulatory Visit: Payer: Self-pay | Admitting: Family Medicine

## 2020-07-19 ENCOUNTER — Ambulatory Visit (HOSPITAL_COMMUNITY): Payer: Medicare HMO

## 2020-07-19 ENCOUNTER — Telehealth (HOSPITAL_COMMUNITY): Payer: Self-pay

## 2020-07-19 NOTE — Telephone Encounter (Signed)
No response from pt regarding CR.  Closed referral.  

## 2020-07-21 ENCOUNTER — Ambulatory Visit (HOSPITAL_COMMUNITY): Payer: Medicare HMO

## 2020-07-24 ENCOUNTER — Ambulatory Visit (HOSPITAL_COMMUNITY): Payer: Medicare HMO

## 2020-07-25 ENCOUNTER — Ambulatory Visit: Payer: Medicare HMO

## 2020-07-26 ENCOUNTER — Ambulatory Visit (HOSPITAL_COMMUNITY): Payer: Medicare HMO

## 2020-07-26 ENCOUNTER — Ambulatory Visit (INDEPENDENT_AMBULATORY_CARE_PROVIDER_SITE_OTHER): Payer: Medicare HMO

## 2020-07-26 ENCOUNTER — Other Ambulatory Visit: Payer: Self-pay

## 2020-07-26 DIAGNOSIS — I10 Essential (primary) hypertension: Secondary | ICD-10-CM

## 2020-07-26 DIAGNOSIS — E785 Hyperlipidemia, unspecified: Secondary | ICD-10-CM

## 2020-07-26 LAB — LIPID PANEL
Cholesterol: 210 mg/dL — ABNORMAL HIGH (ref 0–200)
HDL: 37.8 mg/dL — ABNORMAL LOW (ref >39.00)
NonHDL: 172.41
Total CHOL/HDL Ratio: 6
Triglycerides: 243 mg/dL — ABNORMAL HIGH (ref 0.0–149.0)
VLDL: 48.6 mg/dL — ABNORMAL HIGH (ref 0.0–40.0)

## 2020-07-26 LAB — HEPATIC FUNCTION PANEL
ALT: 15 U/L (ref 0–53)
AST: 13 U/L (ref 0–37)
Albumin: 4.3 g/dL (ref 3.5–5.2)
Alkaline Phosphatase: 63 U/L (ref 39–117)
Bilirubin, Direct: 0.1 mg/dL (ref 0.0–0.3)
Total Bilirubin: 0.4 mg/dL (ref 0.2–1.2)
Total Protein: 7.3 g/dL (ref 6.0–8.3)

## 2020-07-26 LAB — LDL CHOLESTEROL, DIRECT: Direct LDL: 148 mg/dL

## 2020-07-26 LAB — BASIC METABOLIC PANEL
BUN: 22 mg/dL (ref 6–23)
CO2: 29 mEq/L (ref 19–32)
Calcium: 9.4 mg/dL (ref 8.4–10.5)
Chloride: 100 mEq/L (ref 96–112)
Creatinine, Ser: 1 mg/dL (ref 0.40–1.50)
GFR: 74.92 mL/min (ref >60.00)
Glucose, Bld: 114 mg/dL — ABNORMAL HIGH (ref 70–99)
Potassium: 4.4 mEq/L (ref 3.5–5.1)
Sodium: 138 mEq/L (ref 135–145)

## 2020-07-26 LAB — CBC WITH DIFFERENTIAL/PLATELET
Basophils Absolute: 0 10*3/uL (ref 0.0–0.1)
Basophils Relative: 0.3 % (ref 0.0–3.0)
Eosinophils Absolute: 0.3 10*3/uL (ref 0.0–0.7)
Eosinophils Relative: 4.5 % (ref 0.0–5.0)
HCT: 40.5 % (ref 39.0–52.0)
Hemoglobin: 13.5 g/dL (ref 13.0–17.0)
Lymphocytes Relative: 27.4 % (ref 12.0–46.0)
Lymphs Abs: 2 10*3/uL (ref 0.7–4.0)
MCHC: 33.4 g/dL (ref 30.0–36.0)
MCV: 86.4 fl (ref 78.0–100.0)
Monocytes Absolute: 0.5 10*3/uL (ref 0.1–1.0)
Monocytes Relative: 6.9 % (ref 3.0–12.0)
Neutro Abs: 4.4 10*3/uL (ref 1.4–7.7)
Neutrophils Relative %: 60.9 % (ref 43.0–77.0)
Platelets: 238 10*3/uL (ref 150.0–400.0)
RBC: 4.69 Mil/uL (ref 4.22–5.81)
RDW: 15.4 % (ref 11.5–15.5)
WBC: 7.2 10*3/uL (ref 4.0–10.5)

## 2020-07-26 LAB — TSH: TSH: 2.24 u[IU]/mL (ref 0.35–4.50)

## 2020-07-27 ENCOUNTER — Other Ambulatory Visit (INDEPENDENT_AMBULATORY_CARE_PROVIDER_SITE_OTHER): Payer: Medicare HMO

## 2020-07-27 ENCOUNTER — Other Ambulatory Visit: Payer: Self-pay | Admitting: General Practice

## 2020-07-27 DIAGNOSIS — R7309 Other abnormal glucose: Secondary | ICD-10-CM | POA: Diagnosis not present

## 2020-07-27 LAB — HEMOGLOBIN A1C: Hgb A1c MFr Bld: 6.3 % (ref 4.6–6.5)

## 2020-07-27 MED ORDER — ROSUVASTATIN CALCIUM 20 MG PO TABS: 20.0000 mg | ORAL_TABLET | Freq: Every day | ORAL | 0 refills | Status: DC

## 2020-07-28 ENCOUNTER — Ambulatory Visit (HOSPITAL_COMMUNITY): Payer: Medicare HMO

## 2020-07-31 ENCOUNTER — Encounter: Payer: Self-pay | Admitting: Pulmonary Disease

## 2020-07-31 ENCOUNTER — Ambulatory Visit (HOSPITAL_COMMUNITY): Payer: Medicare HMO

## 2020-08-02 ENCOUNTER — Ambulatory Visit (HOSPITAL_COMMUNITY): Payer: Medicare HMO

## 2020-08-04 ENCOUNTER — Ambulatory Visit (HOSPITAL_COMMUNITY): Payer: Medicare HMO

## 2020-08-07 ENCOUNTER — Ambulatory Visit (HOSPITAL_COMMUNITY): Payer: Medicare HMO

## 2020-08-09 ENCOUNTER — Ambulatory Visit (HOSPITAL_COMMUNITY): Payer: Medicare HMO

## 2020-08-11 ENCOUNTER — Ambulatory Visit (HOSPITAL_COMMUNITY): Payer: Medicare HMO

## 2020-08-14 ENCOUNTER — Ambulatory Visit (HOSPITAL_COMMUNITY): Payer: Medicare HMO

## 2020-08-16 ENCOUNTER — Ambulatory Visit (HOSPITAL_COMMUNITY): Payer: Medicare HMO

## 2020-08-18 ENCOUNTER — Ambulatory Visit (HOSPITAL_COMMUNITY): Payer: Medicare HMO

## 2020-08-21 ENCOUNTER — Ambulatory Visit (HOSPITAL_COMMUNITY): Payer: Medicare HMO

## 2020-08-22 ENCOUNTER — Encounter: Payer: Self-pay | Admitting: Pulmonary Disease

## 2020-08-22 ENCOUNTER — Ambulatory Visit: Payer: Medicare HMO | Admitting: Pulmonary Disease

## 2020-08-22 ENCOUNTER — Ambulatory Visit: Payer: Medicare HMO | Admitting: Cardiology

## 2020-08-22 ENCOUNTER — Other Ambulatory Visit: Payer: Self-pay

## 2020-08-22 VITALS — BP 128/66 | HR 60 | Temp 97.8°F | Ht 72.0 in | Wt 283.6 lb

## 2020-08-22 DIAGNOSIS — R0602 Shortness of breath: Secondary | ICD-10-CM | POA: Diagnosis not present
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
## 2020-08-22 NOTE — Patient Instructions (Signed)
Shortness of breath  -We will obtain a breathing study   -Graded exercises as tolerated  -Inhaler technique as we discussed  -Follow-up in 6-8 weeks  -Continue using Symbicort and rescue inhaler  -Call with significant concerns

## 2020-08-23 ENCOUNTER — Ambulatory Visit (HOSPITAL_COMMUNITY): Payer: Medicare HMO

## 2020-08-25 ENCOUNTER — Ambulatory Visit (HOSPITAL_COMMUNITY): Payer: Medicare HMO

## 2020-09-04 ENCOUNTER — Encounter: Payer: Self-pay | Admitting: Family Medicine

## 2020-09-05 ENCOUNTER — Telehealth: Payer: Self-pay | Admitting: Family Medicine

## 2020-09-05 NOTE — Telephone Encounter (Signed)
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  Drivers license # was not on form.  Requested that patient call us back and tell us #

## 2020-09-05 NOTE — Telephone Encounter (Signed)
Handicap placard form placed in Dr. Virgil Benedict bin up front

## 2020-09-05 NOTE — Telephone Encounter (Signed)
Noted  

## 2020-09-18 ENCOUNTER — Other Ambulatory Visit: Payer: Self-pay | Admitting: Family Medicine

## 2020-09-19 NOTE — Telephone Encounter (Signed)
LFD 07/05/20 #30 with 1 refill LOV 07/05/20 NOV none

## 2020-09-28 ENCOUNTER — Other Ambulatory Visit: Payer: Self-pay

## 2020-09-28 ENCOUNTER — Ambulatory Visit (INDEPENDENT_AMBULATORY_CARE_PROVIDER_SITE_OTHER): Payer: Medicare HMO | Admitting: Pulmonary Disease

## 2020-09-28 ENCOUNTER — Encounter: Payer: Self-pay | Admitting: Pulmonary Disease

## 2020-09-28 VITALS — BP 138/82 | HR 67 | Temp 97.6°F | Ht 72.0 in | Wt 289.0 lb

## 2020-09-28 DIAGNOSIS — R0602 Shortness of breath: Secondary | ICD-10-CM | POA: Diagnosis not present

## 2020-09-28 LAB — PULMONARY FUNCTION TEST
DL/VA % pred: 96 %
DL/VA: 3.84 ml/min/mmHg/L
DLCO cor % pred: 83 %
DLCO cor: 22.82 ml/min/mmHg
DLCO unc % pred: 83 %
DLCO unc: 22.82 ml/min/mmHg
FEF 25-75 Post: 1.68 L/sec
FEF 25-75 Pre: 1.57 L/sec
FEF2575-%Change-Post: 7 %
FEF2575-%Pred-Post: 65 %
FEF2575-%Pred-Pre: 61 %
FEV1-%Change-Post: 2 %
FEV1-%Pred-Post: 72 %
FEV1-%Pred-Pre: 71 %
FEV1-Post: 2.51 L
FEV1-Pre: 2.46 L
FEV1FVC-%Change-Post: 0 %
FEV1FVC-%Pred-Pre: 95 %
FEV6-%Change-Post: 0 %
FEV6-%Pred-Post: 80 %
FEV6-%Pred-Pre: 79 %
FEV6-Post: 3.55 L
FEV6-Pre: 3.52 L
FEV6FVC-%Change-Post: 0 %
FEV6FVC-%Pred-Post: 105 %
FEV6FVC-%Pred-Pre: 105 %
FVC-%Change-Post: 1 %
FVC-%Pred-Post: 75 %
FVC-%Pred-Pre: 75 %
FVC-Post: 3.56 L
FVC-Pre: 3.52 L
Post FEV1/FVC ratio: 70 %
Post FEV6/FVC ratio: 100 %
Pre FEV1/FVC ratio: 70 %
Pre FEV6/FVC Ratio: 100 %
RV % pred: 118 %
RV: 3.08 L
TLC % pred: 91 %
TLC: 6.8 L

## 2020-09-28 NOTE — Progress Notes (Signed)
Full PFT performed today. °
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
## 2020-09-28 NOTE — Patient Instructions (Signed)
Mild COPD on your breathing study  Continue Symbicort and rescue inhaler as needed   Weight loss efforts  Contrave is a weight loss pill Topamax also helps with weight loss  Call with significant concerns

## 2020-10-06 ENCOUNTER — Ambulatory Visit (INDEPENDENT_AMBULATORY_CARE_PROVIDER_SITE_OTHER): Payer: Medicare HMO

## 2020-10-06 ENCOUNTER — Encounter: Payer: Self-pay | Admitting: Surgical

## 2020-10-06 ENCOUNTER — Ambulatory Visit: Payer: Self-pay

## 2020-10-06 ENCOUNTER — Ambulatory Visit: Payer: Medicare HMO | Admitting: Surgical

## 2020-10-06 DIAGNOSIS — R269 Unspecified abnormalities of gait and mobility: Secondary | ICD-10-CM | POA: Diagnosis not present

## 2020-10-06 DIAGNOSIS — M79672 Pain in left foot: Secondary | ICD-10-CM | POA: Diagnosis not present

## 2020-10-06 DIAGNOSIS — G8929 Other chronic pain: Secondary | ICD-10-CM | POA: Diagnosis not present

## 2020-10-06 DIAGNOSIS — M25561 Pain in right knee: Secondary | ICD-10-CM

## 2020-10-06 DIAGNOSIS — M25562 Pain in left knee: Secondary | ICD-10-CM
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
## 2020-10-09 ENCOUNTER — Telehealth: Payer: Self-pay | Admitting: *Deleted

## 2020-10-09 DIAGNOSIS — R269 Unspecified abnormalities of gait and mobility: Secondary | ICD-10-CM

## 2020-10-09 NOTE — Telephone Encounter (Signed)
Pt was in on 10/06/20 and during the visit he stated that [facility] had mentioned a CT scan of Brain due to gait issues etc. Pt states at the time he was in he did not want to get the CT scan and wanted to wait, now he has decided to get it done. Please advise.

## 2020-10-09 NOTE — Telephone Encounter (Signed)
Ok to order 

## 2020-10-10 NOTE — Telephone Encounter (Signed)
STAT order for MRI brain has been entered for patient per [facility]. Thanks.

## 2020-10-10 NOTE — Addendum Note (Signed)
Addended by: Meyer Cory on: 10/10/2020 03:49 PM   Modules accepted: Orders

## 2020-10-11 NOTE — Telephone Encounter (Signed)
Pending authorizatoin with [employer], sent in clinicals as requested from reprep

## 2020-10-12 ENCOUNTER — Ambulatory Visit
Admission: RE | Admit: 2020-10-12 | Discharge: 2020-10-12 | Disposition: A | Discharge status: Home / Self Care | Payer: Medicare HMO | Source: Ambulatory Visit | Attending: Surgical | Admitting: Surgical

## 2020-10-12 ENCOUNTER — Other Ambulatory Visit: Payer: Self-pay

## 2020-10-12 DIAGNOSIS — R269 Unspecified abnormalities of gait and mobility: Secondary | ICD-10-CM
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
## 2020-10-16 ENCOUNTER — Ambulatory Visit (INDEPENDENT_AMBULATORY_CARE_PROVIDER_SITE_OTHER): Payer: Medicare HMO

## 2020-10-16 ENCOUNTER — Other Ambulatory Visit: Payer: Self-pay

## 2020-10-16 VITALS — Ht 71.0 in | Wt 272.0 lb

## 2020-10-16 DIAGNOSIS — Z0000 Encounter for general adult medical examination without abnormal findings: Secondary | ICD-10-CM

## 2020-10-16 NOTE — Patient Instructions (Signed)
Allen [name] , Thank you for taking time to complete your Medicare Wellness Visit. I appreciate your ongoing commitment to your health goals. Please review the following plan we discussed and let me know if I can assist you in the future.   Screening recommendations/referrals: Colonoscopy: Due-Declined today-Please call the office when you are ready to schedule. Recommended yearly ophthalmology/optometry visit for glaucoma screening and checkup Recommended yearly dental visit for hygiene and checkup  Vaccinations: Influenza vaccine: Due- May obtain vaccine at our office or your local pharmacy Pneumococcal vaccine: Completed vaccines Tdap vaccine: Discuss with pharmacy Shingles vaccine: Discuss with pharmacy   Covid-19: Completed vaccines. Please bring documentation to your next office visit for your chart.  Advanced directives: Information mailed.  Conditions/risks identified: See problem list  Next appointment: Follow up in one year for your annual wellness visit. 10/29/2021 @ 11:15  Preventive Care 65 Years and Older, Male Preventive care refers to lifestyle choices and visits with your health care provider that can promote health and wellness. What does preventive care include?  A yearly physical exam. This is also called an annual well check.  Dental exams once or twice a year.  Routine eye exams. Ask your health care provider how often you should have your eyes checked.  Personal lifestyle choices, including:  Daily care of your teeth and gums.  Regular physical activity.  Eating a healthy diet.  Avoiding tobacco and drug use.  Limiting alcohol use.  Practicing safe sex.  Taking low doses of aspirin every day.  Taking vitamin and mineral supplements as recommended by your health care provider. What happens during an annual well check? The services and screenings done by your health care provider during your annual well check will depend on your age, overall health,  lifestyle risk factors, and family history of disease. Counseling  Your health care provider may ask you questions about your:  Alcohol use.  Tobacco use.  Drug use.  Emotional well-being.  Home and relationship well-being.  Sexual activity.  Eating habits.  History of falls.  Memory and ability to understand (cognition).  Work and work Astronomer. Screening  You may have the following tests or measurements:  Height, weight, and BMI.  Blood pressure.  Lipid and cholesterol levels. These may be checked every 5 years, or more frequently if you are over 47 years old.  Skin check.  Lung cancer screening. You may have this screening every year starting at age 60 if you have a 30-pack-year history of smoking and currently smoke or have quit within the past 15 years.  Fecal occult blood test (FOBT) of the stool. You may have this test every year starting at age 63.  Flexible sigmoidoscopy or colonoscopy. You may have a sigmoidoscopy every 5 years or a colonoscopy every 10 years starting at age 37.  Prostate cancer screening. Recommendations will vary depending on your family history and other risks.  Hepatitis C blood test.  Hepatitis B blood test.  Sexually transmitted disease (STD) testing.  Diabetes screening. This is done by checking your blood sugar (glucose) after you have not eaten for a while (fasting). You may have this done every 1-3 years.  Abdominal aortic aneurysm (AAA) screening. You may need this if you are a current or former smoker.  Osteoporosis. You may be screened starting at age 68 if you are at high risk. Talk with your health care provider about your test results, treatment options, and if necessary, the need for more tests. Vaccines  Your  health care provider may recommend certain vaccines, such as:  Influenza vaccine. This is recommended every year.  Tetanus, diphtheria, and acellular pertussis (Tdap, Td) vaccine. You may need a Td booster  every 10 years.  Zoster vaccine. You may need this after age 35.  Pneumococcal 13-valent conjugate (PCV13) vaccine. One dose is recommended after age 10.  Pneumococcal polysaccharide (PPSV23) vaccine. One dose is recommended after age 60. Talk to your health care provider about which screenings and vaccines you need and how often you need them. This information is not intended to replace advice given to you by your health care provider. Make sure you discuss any questions you have with your health care provider. Document Released: 11/03/2015 Document Revised: 06/26/2016 Document Reviewed: 08/08/2015 Elsevier Interactive Patient Education  2017 [employer] Prevention in the Home Falls can cause injuries. They can happen to people of all ages. There are many things you can do to make your home safe and to help prevent falls. What can I do on the outside of my home?  Regularly fix the edges of walkways and driveways and fix any cracks.  Remove anything that might make you trip as you walk through a door, such as a raised step or threshold.  Trim any bushes or trees on the path to your home.  Use bright outdoor lighting.  Clear any walking paths of anything that might make someone trip, such as rocks or tools.  Regularly check to see if handrails are loose or broken. Make sure that both sides of any steps have handrails.  Any raised decks and porches should have guardrails on the edges.  Have any leaves, snow, or ice cleared regularly.  Use sand or salt on walking paths during winter.  Clean up any spills in your garage right away. This includes oil or grease spills. What can I do in the bathroom?  Use night lights.  Install grab bars by the toilet and in the tub and shower. Do not use towel bars as grab bars.  Use non-skid mats or decals in the tub or shower.  If you need to sit down in the shower, use a plastic, non-slip stool.  Keep the floor dry. Clean up any  water that spills on the floor as soon as it happens.  Remove soap buildup in the tub or shower regularly.  Attach bath mats securely with double-sided non-slip rug tape.  Do not have throw rugs and other things on the floor that can make you trip. What can I do in the bedroom?  Use night lights.  Make sure that you have a light by your bed that is easy to reach.  Do not use any sheets or blankets that are too big for your bed. They should not hang down onto the floor.  Have a firm chair that has side arms. You can use this for support while you get dressed.  Do not have throw rugs and other things on the floor that can make you trip. What can I do in the kitchen?  Clean up any spills right away.  Avoid walking on wet floors.  Keep items that you use a lot in easy-to-reach places.  If you need to reach something above you, use a strong step stool that has a grab bar.  Keep electrical cords out of the way.  Do not use floor polish or wax that makes floors slippery. If you must use wax, use non-skid floor wax.  Do not  have throw rugs and other things on the floor that can make you trip. What can I do with my stairs?  Do not leave any items on the stairs.  Make sure that there are handrails on both sides of the stairs and use them. Fix handrails that are broken or loose. Make sure that handrails are as long as the stairways.  Check any carpeting to make sure that it is firmly attached to the stairs. Fix any carpet that is loose or worn.  Avoid having throw rugs at the top or bottom of the stairs. If you do have throw rugs, attach them to the floor with carpet tape.  Make sure that you have a light switch at the top of the stairs and the bottom of the stairs. If you do not have them, ask someone to add them for you. What else can I do to help prevent falls?  Wear shoes that:  Do not have high heels.  Have rubber bottoms.  Are comfortable and fit you well.  Are closed  at the toe. Do not wear sandals.  If you use a stepladder:  Make sure that it is fully opened. Do not climb a closed stepladder.  Make sure that both sides of the stepladder are locked into place.  Ask someone to hold it for you, if possible.  Clearly mark and make sure that you can see:  Any grab bars or handrails.  First and last steps.  Where the edge of each step is.  Use tools that help you move around (mobility aids) if they are needed. These include:  Canes.  Walkers.  Scooters.  Crutches.  Turn on the lights when you go into a dark area. Replace any light bulbs as soon as they burn out.  Set up your furniture so you have a clear path. Avoid moving your furniture around.  If any of your floors are uneven, fix them.  If there are any pets around you, be aware of where they are.  Review your medicines with your doctor. Some medicines can make you feel dizzy. This can increase your chance of falling. Ask your doctor what other things that you can do to help prevent falls. This information is not intended to replace advice given to you by your health care provider. Make sure you discuss any questions you have with your health care provider. Document Released: 08/03/2009 Document Revised: 03/14/2016 Document Reviewed: 11/11/2014 Elsevier Interactive Patient Education  2017 [employer].

## 2020-10-17 ENCOUNTER — Telehealth: Payer: Self-pay

## 2020-10-17 NOTE — Telephone Encounter (Signed)
°

## 2020-10-17 NOTE — Telephone Encounter (Signed)
Patients daughter called she would like a call back regarding results from MRI patient had. [phone]

## 2020-10-18 ENCOUNTER — Ambulatory Visit: Payer: Medicare HMO | Admitting: Surgical

## 2020-10-18 NOTE — Telephone Encounter (Signed)
Called patient, thanks.

## 2020-10-26 ENCOUNTER — Other Ambulatory Visit: Payer: Self-pay | Admitting: Family Medicine

## 2020-10-27 ENCOUNTER — Ambulatory Visit: Payer: Medicare HMO | Admitting: Surgical

## 2020-11-03 ENCOUNTER — Other Ambulatory Visit: Payer: Self-pay

## 2020-11-03 ENCOUNTER — Ambulatory Visit (INDEPENDENT_AMBULATORY_CARE_PROVIDER_SITE_OTHER): Payer: Medicare Other | Admitting: Orthopedic Surgery

## 2020-11-03 DIAGNOSIS — R269 Unspecified abnormalities of gait and mobility: Secondary | ICD-10-CM

## 2020-11-04 ENCOUNTER — Encounter: Payer: Self-pay | Admitting: Orthopedic Surgery
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
## 2020-11-14 ENCOUNTER — Other Ambulatory Visit: Payer: Self-pay | Admitting: Family Medicine

## 2021-01-04 DIAGNOSIS — L03115 Cellulitis of right lower limb: Secondary | ICD-10-CM | POA: Diagnosis not present

## 2021-01-04 DIAGNOSIS — M7731 Calcaneal spur, right foot: Secondary | ICD-10-CM | POA: Diagnosis not present

## 2021-01-20 ENCOUNTER — Other Ambulatory Visit: Payer: Self-pay | Admitting: Family Medicine

## 2021-01-20 ENCOUNTER — Other Ambulatory Visit: Payer: Self-pay | Admitting: Cardiovascular Disease

## 2021-01-26 ENCOUNTER — Other Ambulatory Visit: Payer: Self-pay | Admitting: Family Medicine

## 2021-02-11 ENCOUNTER — Other Ambulatory Visit: Payer: Self-pay | Admitting: Family Medicine

## 2021-03-05 ENCOUNTER — Other Ambulatory Visit: Payer: Self-pay | Admitting: Family Medicine
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
## 2021-03-16 ENCOUNTER — Ambulatory Visit: Payer: Medicare Other | Admitting: Adult Health

## 2021-03-27 ENCOUNTER — Other Ambulatory Visit: Payer: Self-pay | Admitting: Family Medicine

## 2021-04-01 ENCOUNTER — Other Ambulatory Visit: Payer: Self-pay | Admitting: Family Medicine

## 2021-04-18 ENCOUNTER — Encounter: Payer: Self-pay | Admitting: *Deleted

## 2021-04-26 ENCOUNTER — Ambulatory Visit: Payer: Medicare Other | Admitting: Medical
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
## 2021-04-28 ENCOUNTER — Other Ambulatory Visit: Payer: Self-pay | Admitting: Cardiology

## 2021-04-28 ENCOUNTER — Other Ambulatory Visit: Payer: Self-pay | Admitting: Family Medicine

## 2021-04-30 NOTE — Telephone Encounter (Signed)
Not an anticoagulant drug therefore doesn't come to cvrr team

## 2021-05-01 ENCOUNTER — Telehealth: Payer: Self-pay | Admitting: *Deleted

## 2021-05-01 NOTE — Chronic Care Management (AMB) (Signed)
  Chronic Care Management   Note  05/01/2021 Name: Allen [name] MRN: [id number] DOB: 1946/03/30  Allen [name] is a 75 y.o. year old male who is a primary care patient of Birdie Riddle, Aundra Millet, MD. I reached out to [name] by phone today in response to a referral sent by [name]'s PCP  [name], MD     [name] was given information about Chronic Care Management services today including:  CCM service includes personalized support from designated clinical staff supervised by his physician, including individualized plan of care and coordination with other care providers 24/7 contact phone numbers for assistance for urgent and routine care needs. Service will only be billed when office clinical staff spend 20 minutes or more in a month to coordinate care. Only one practitioner may furnish and bill the service in a calendar month. The patient may stop CCM services at any time (effective at the end of the month) by phone call to the office staff. The patient will be responsible for cost sharing (co-pay) of up to 20% of the service fee (after annual deductible is met).  Patient agreed to services and verbal consent obtained.   Follow up plan: Telephone appointment with care management team member scheduled for:05/16/2021  Julian Hy, [place], [place] [facility]  Direct Dial: [phone]

## 2021-05-04 ENCOUNTER — Other Ambulatory Visit: Payer: Self-pay

## 2021-05-04 ENCOUNTER — Encounter: Payer: Self-pay | Admitting: Registered Nurse

## 2021-05-04 ENCOUNTER — Ambulatory Visit (INDEPENDENT_AMBULATORY_CARE_PROVIDER_SITE_OTHER): Payer: Medicare Other | Admitting: Registered Nurse

## 2021-05-04 VITALS — BP 125/66 | HR 76 | Temp 98.3°F | Resp 18 | Ht 71.0 in | Wt 275.0 lb

## 2021-05-04 DIAGNOSIS — M79672 Pain in left foot: Secondary | ICD-10-CM | POA: Diagnosis not present

## 2021-05-04 DIAGNOSIS — M79671 Pain in right foot: Secondary | ICD-10-CM | POA: Diagnosis not present

## 2021-05-04 MED ORDER — TRAMADOL HCL 50 MG PO TABS: 50.0000 mg | ORAL_TABLET | Freq: Three times a day (TID) | ORAL | 0 refills | Status: AC | PRN

## 2021-05-04 MED ORDER — COLCHICINE 0.6 MG PO TABS: 0.6000 mg | ORAL_TABLET | Freq: Every day | ORAL | 3 refills | Status: AC

## 2021-05-04 NOTE — Patient Instructions (Addendum)
Allen [name] -   Could be gout, gout tends to flare more in joints rather than in between. Worth ruling in or out with labs today  Colchicine is anti-inflammatory, can help with pain. Tramadol for breakthrough pain. Tramadol is a pseudo-opioid, not ideal for long term or chronic use.  Labs should be back tomorrow I'll let you know how they look  If no relief, or if labs uncertain, can refer to podiatry for further work up and foot care.  Thank you  Rich     If you have lab work done today you will be contacted with your lab results within the next 2 weeks.  If you have not heard from [place] then please contact us. The fastest way to get your results is to register for My Chart.   IF you received an x-ray today, you will receive an invoice from [facility]. Please contact [facility] at [phone] with questions or concerns regarding your invoice.   IF you received labwork today, you will receive an invoice from [facility]. Please contact [facility] at [phone] with questions or concerns regarding your invoice.   Our billing staff will not be able to assist you with questions regarding bills from these companies.  You will be contacted with the lab results as soon as they are available. The fastest way to get your results is to activate your My Chart account. Instructions are located on the last page of this paperwork. If you have not heard from [place] regarding the results in 2 weeks, please contact this office.

## 2021-05-05 LAB — COMPREHENSIVE METABOLIC PANEL
AG Ratio: 1.3 (calc) (ref 1.0–2.5)
ALT: 15 U/L (ref 9–46)
AST: 14 U/L (ref 10–35)
Albumin: 4.4 g/dL (ref 3.6–5.1)
Alkaline phosphatase (APISO): 76 U/L (ref 35–144)
BUN: 23 mg/dL (ref 7–25)
CO2: 29 mmol/L (ref 20–32)
Calcium: 9.4 mg/dL (ref 8.6–10.3)
Chloride: 100 mmol/L (ref 98–110)
Creat: 1.22 mg/dL (ref 0.70–1.28)
Globulin: 3.3 g/dL (calc) (ref 1.9–3.7)
Glucose, Bld: 98 mg/dL (ref 65–99)
Potassium: 4.3 mmol/L (ref 3.5–5.3)
Sodium: 139 mmol/L (ref 135–146)
Total Bilirubin: 0.5 mg/dL (ref 0.2–1.2)
Total Protein: 7.7 g/dL (ref 6.1–8.1)

## 2021-05-05 LAB — CBC
HCT: 41.8 % (ref 38.5–50.0)
Hemoglobin: 13.9 g/dL (ref 13.2–17.1)
MCH: 28.5 pg (ref 27.0–33.0)
MCHC: 33.3 g/dL (ref 32.0–36.0)
MCV: 85.8 fL (ref 80.0–100.0)
MPV: 10.3 fL (ref 7.5–12.5)
Platelets: 306 10*3/uL (ref 140–400)
RBC: 4.87 10*6/uL (ref 4.20–5.80)
RDW: 14.1 % (ref 11.0–15.0)
WBC: 9.6 10*3/uL (ref 3.8–10.8)

## 2021-05-05 LAB — HEMOGLOBIN A1C
Hgb A1c MFr Bld: 5.8 % of total Hgb — ABNORMAL HIGH (ref <5.7)
Mean Plasma Glucose: 120 mg/dL
eAG (mmol/L): 6.6 mmol/L

## 2021-05-05 LAB — URIC ACID: Uric Acid, Serum: 10.5 mg/dL — ABNORMAL HIGH (ref 4.0–8.0)

## 2021-05-06 ENCOUNTER — Other Ambulatory Visit: Payer: Self-pay | Admitting: Registered Nurse

## 2021-05-06 DIAGNOSIS — M1A0790 Idiopathic chronic gout, unspecified ankle and foot, without tophus (tophi): Secondary | ICD-10-CM

## 2021-05-06 MED ORDER — ALLOPURINOL 100 MG PO TABS: 100.0000 mg | ORAL_TABLET | Freq: Every day | ORAL | 6 refills | Status: AC

## 2021-05-16 ENCOUNTER — Telehealth: Payer: Medicare Other

## 2021-05-16 ENCOUNTER — Telehealth: Payer: Self-pay

## 2021-05-16 NOTE — Telephone Encounter (Signed)
  Care Management   Follow Up Note   05/16/2021 Name: Allen [name] MRN: [id number] DOB: Dec 16, 1945   Referred by: [name], MD Reason for referral : Chronic Care Management (RNCM: Initial Outreach Chronic Care Management and coordination needs-unsuccessful outreach)   An unsuccessful telephone outreach was attempted today. The patient was referred to the case management team for assistance with care management and care coordination.   Follow Up Plan: The care management team will reach out to the patient again over the next 30 days.   Peter Garter RN, BSN,CCM, CDE Care Management Coordinator [place] [phone], Mobile [phone]

## 2021-05-23 ENCOUNTER — Observation Stay (HOSPITAL_COMMUNITY)
Admission: EM | Admit: 2021-05-23 | Discharge: 2021-05-25 | Disposition: A | Discharge status: Home / Self Care | Payer: Medicare Other | Attending: Emergency Medicine | Admitting: Emergency Medicine

## 2021-05-23 ENCOUNTER — Emergency Department (HOSPITAL_COMMUNITY): Payer: Medicare Other

## 2021-05-23 ENCOUNTER — Other Ambulatory Visit: Payer: Self-pay

## 2021-05-23 ENCOUNTER — Encounter (HOSPITAL_COMMUNITY): Payer: Self-pay | Admitting: Emergency Medicine

## 2021-05-23 DIAGNOSIS — E870 Hyperosmolality and hypernatremia: Secondary | ICD-10-CM | POA: Diagnosis not present

## 2021-05-23 DIAGNOSIS — K5730 Diverticulosis of large intestine without perforation or abscess without bleeding: Secondary | ICD-10-CM | POA: Diagnosis not present

## 2021-05-23 DIAGNOSIS — R1011 Right upper quadrant pain: Secondary | ICD-10-CM

## 2021-05-23 DIAGNOSIS — I771 Stricture of artery: Secondary | ICD-10-CM | POA: Insufficient documentation

## 2021-05-23 DIAGNOSIS — J90 Pleural effusion, not elsewhere classified: Secondary | ICD-10-CM | POA: Diagnosis not present

## 2021-05-23 DIAGNOSIS — J449 Chronic obstructive pulmonary disease, unspecified: Secondary | ICD-10-CM | POA: Insufficient documentation

## 2021-05-23 DIAGNOSIS — R109 Unspecified abdominal pain: Secondary | ICD-10-CM | POA: Diagnosis not present

## 2021-05-23 DIAGNOSIS — R0789 Other chest pain: Secondary | ICD-10-CM | POA: Diagnosis not present

## 2021-05-23 DIAGNOSIS — K4020 Bilateral inguinal hernia, without obstruction or gangrene, not specified as recurrent: Secondary | ICD-10-CM | POA: Diagnosis not present

## 2021-05-23 DIAGNOSIS — R7303 Prediabetes: Secondary | ICD-10-CM | POA: Insufficient documentation

## 2021-05-23 DIAGNOSIS — Z79899 Other long term (current) drug therapy: Secondary | ICD-10-CM | POA: Insufficient documentation

## 2021-05-23 DIAGNOSIS — E785 Hyperlipidemia, unspecified: Secondary | ICD-10-CM | POA: Diagnosis present

## 2021-05-23 DIAGNOSIS — I2699 Other pulmonary embolism without acute cor pulmonale: Secondary | ICD-10-CM | POA: Diagnosis not present

## 2021-05-23 DIAGNOSIS — M545 Low back pain, unspecified: Secondary | ICD-10-CM | POA: Diagnosis not present

## 2021-05-23 DIAGNOSIS — I700 Atherosclerosis of aorta: Secondary | ICD-10-CM | POA: Diagnosis not present

## 2021-05-23 DIAGNOSIS — Z9861 Coronary angioplasty status: Secondary | ICD-10-CM

## 2021-05-23 DIAGNOSIS — Z7982 Long term (current) use of aspirin: Secondary | ICD-10-CM | POA: Diagnosis not present

## 2021-05-23 DIAGNOSIS — I10 Essential (primary) hypertension: Secondary | ICD-10-CM | POA: Diagnosis present

## 2021-05-23 DIAGNOSIS — J439 Emphysema, unspecified: Secondary | ICD-10-CM | POA: Diagnosis present

## 2021-05-23 DIAGNOSIS — F419 Anxiety disorder, unspecified: Secondary | ICD-10-CM | POA: Diagnosis present

## 2021-05-23 DIAGNOSIS — J9601 Acute respiratory failure with hypoxia: Secondary | ICD-10-CM | POA: Diagnosis not present

## 2021-05-23 DIAGNOSIS — K219 Gastro-esophageal reflux disease without esophagitis: Secondary | ICD-10-CM | POA: Diagnosis present

## 2021-05-23 DIAGNOSIS — I2510 Atherosclerotic heart disease of native coronary artery without angina pectoris: Secondary | ICD-10-CM

## 2021-05-23 DIAGNOSIS — R079 Chest pain, unspecified: Secondary | ICD-10-CM | POA: Diagnosis not present

## 2021-05-23 DIAGNOSIS — Z87891 Personal history of nicotine dependence: Secondary | ICD-10-CM | POA: Diagnosis not present

## 2021-05-23 DIAGNOSIS — K760 Fatty (change of) liver, not elsewhere classified: Secondary | ICD-10-CM | POA: Diagnosis not present

## 2021-05-23 DIAGNOSIS — I2694 Multiple subsegmental pulmonary emboli without acute cor pulmonale: Secondary | ICD-10-CM | POA: Diagnosis not present

## 2021-05-23 DIAGNOSIS — D72829 Elevated white blood cell count, unspecified: Secondary | ICD-10-CM | POA: Diagnosis not present

## 2021-05-23 DIAGNOSIS — Z20822 Contact with and (suspected) exposure to covid-19: Secondary | ICD-10-CM | POA: Insufficient documentation

## 2021-05-23 DIAGNOSIS — R0902 Hypoxemia: Secondary | ICD-10-CM | POA: Diagnosis not present

## 2021-05-23 DIAGNOSIS — R0602 Shortness of breath: Secondary | ICD-10-CM | POA: Diagnosis not present

## 2021-05-23 DIAGNOSIS — F32A Depression, unspecified: Secondary | ICD-10-CM | POA: Diagnosis present

## 2021-05-23 DIAGNOSIS — J9811 Atelectasis: Secondary | ICD-10-CM | POA: Diagnosis not present

## 2021-05-23 LAB — COMPREHENSIVE METABOLIC PANEL
ALT: 17 U/L (ref 0–44)
AST: 17 U/L (ref 15–41)
Albumin: 4.1 g/dL (ref 3.5–5.0)
Alkaline Phosphatase: 70 U/L (ref 38–126)
Anion gap: 11 (ref 5–15)
BUN: 25 mg/dL — ABNORMAL HIGH (ref 8–23)
CO2: 25 mmol/L (ref 22–32)
Calcium: 9.1 mg/dL (ref 8.9–10.3)
Chloride: 96 mmol/L — ABNORMAL LOW (ref 98–111)
Creatinine, Ser: 1.25 mg/dL — ABNORMAL HIGH (ref 0.61–1.24)
GFR, Estimated: >60 mL/min (ref >60)
Glucose, Bld: 121 mg/dL — ABNORMAL HIGH (ref 70–99)
Potassium: 3.7 mmol/L (ref 3.5–5.1)
Sodium: 132 mmol/L — ABNORMAL LOW (ref 135–145)
Total Bilirubin: 0.7 mg/dL (ref 0.3–1.2)
Total Protein: 7.8 g/dL (ref 6.5–8.1)

## 2021-05-23 LAB — URINALYSIS, ROUTINE W REFLEX MICROSCOPIC
Bilirubin Urine: NEGATIVE
Glucose, UA: NEGATIVE mg/dL
Hgb urine dipstick: NEGATIVE
Ketones, ur: NEGATIVE mg/dL
Leukocytes,Ua: NEGATIVE
Nitrite: NEGATIVE
Protein, ur: NEGATIVE mg/dL
Specific Gravity, Urine: 1.015 (ref 1.005–1.030)
pH: 5 (ref 5.0–8.0)

## 2021-05-23 LAB — CBC WITH DIFFERENTIAL/PLATELET
Abs Immature Granulocytes: 0.06 10*3/uL (ref 0.00–0.07)
Basophils Absolute: 0.1 10*3/uL (ref 0.0–0.1)
Basophils Relative: 0 %
Eosinophils Absolute: 0.1 10*3/uL (ref 0.0–0.5)
Eosinophils Relative: 1 %
HCT: 43.2 % (ref 39.0–52.0)
Hemoglobin: 13.6 g/dL (ref 13.0–17.0)
Immature Granulocytes: 0 %
Lymphocytes Relative: 11 %
Lymphs Abs: 1.5 10*3/uL (ref 0.7–4.0)
MCH: 28.3 pg (ref 26.0–34.0)
MCHC: 31.5 g/dL (ref 30.0–36.0)
MCV: 89.8 fL (ref 80.0–100.0)
Monocytes Absolute: 1 10*3/uL (ref 0.1–1.0)
Monocytes Relative: 7 %
Neutro Abs: 10.9 10*3/uL — ABNORMAL HIGH (ref 1.7–7.7)
Neutrophils Relative %: 81 %
Platelets: 263 10*3/uL (ref 150–400)
RBC: 4.81 MIL/uL (ref 4.22–5.81)
RDW: 15.2 % (ref 11.5–15.5)
WBC: 13.6 10*3/uL — ABNORMAL HIGH (ref 4.0–10.5)
nRBC: 0 % (ref 0.0–0.2)

## 2021-05-23 MED ORDER — OXYCODONE-ACETAMINOPHEN 5-325 MG PO TABS
1.0000 | ORAL_TABLET | Freq: Once | ORAL | Status: AC
Start: 2021-05-23 — End: 2021-05-23
  Administered 2021-05-23: 1 via ORAL
  Filled 2021-05-23: qty 1

## 2021-05-23 NOTE — ED Triage Notes (Signed)
Pt here via [facility] from dr office for R side flank pain that radiates into R chest. Pt reports lifting things yesterday and waking up w/ pain today. EMS gave '324mg'$  ASA and 1 SL nitro w/o relief.

## 2021-05-23 NOTE — ED Provider Notes (Signed)
Emergency Medicine Provider Triage Evaluation Note  Allen [name] , a 75 y.o. male  was evaluated in triage.  Pt complains of right-sided pain.  Pain began last night, initially in the anterior abdomen but now feels more in the back.  He was at urgent care, was short of breath due to pain.  He denies fevers, chills, cough, nausea, vomiting, urinary symptoms, normal bowel movements.  No previous history of kidney stones.  No abdominal surgeries including cholecystectomy.  Review of Systems  Positive: R sided pain Negative: fever  Physical Exam  BP (!) 138/56   Pulse 78   Temp 98.8 F (37.1 C) (Oral)   Resp (!) 30   Ht 6' (1.829 m)   Wt 126.1 kg   SpO2 99%   BMI 37.70 kg/m  Gen:   Awake, no distress   Resp:  Normal effort  MSK:   Moves extremities without difficulty  Other:  Ttp of RUQ, RLQ, and R CVA.   Medical Decision Making  Medically screening exam initiated at 7:32 PM.  Appropriate orders placed.  Allen [name] was informed that the remainder of the evaluation will be completed by another provider, this initial triage assessment does not replace that evaluation, and the importance of remaining in the ED until their evaluation is complete.  Labs, ua, ct, cxr   Allen He[place], PA-C 05/23/21 1934    Allen Bo, MD 05/23/21 [phone]

## 2021-05-24 ENCOUNTER — Observation Stay (HOSPITAL_BASED_OUTPATIENT_CLINIC_OR_DEPARTMENT_OTHER): Payer: Medicare Other

## 2021-05-24 ENCOUNTER — Other Ambulatory Visit (HOSPITAL_COMMUNITY): Payer: Self-pay

## 2021-05-24 ENCOUNTER — Emergency Department (HOSPITAL_COMMUNITY): Payer: Medicare Other

## 2021-05-24 ENCOUNTER — Encounter (HOSPITAL_COMMUNITY): Payer: Self-pay | Admitting: Internal Medicine

## 2021-05-24 DIAGNOSIS — E785 Hyperlipidemia, unspecified: Secondary | ICD-10-CM

## 2021-05-24 DIAGNOSIS — I2510 Atherosclerotic heart disease of native coronary artery without angina pectoris: Secondary | ICD-10-CM

## 2021-05-24 DIAGNOSIS — F419 Anxiety disorder, unspecified: Secondary | ICD-10-CM | POA: Diagnosis not present

## 2021-05-24 DIAGNOSIS — I2694 Multiple subsegmental pulmonary emboli without acute cor pulmonale: Secondary | ICD-10-CM | POA: Diagnosis not present

## 2021-05-24 DIAGNOSIS — D72829 Elevated white blood cell count, unspecified: Secondary | ICD-10-CM | POA: Diagnosis present

## 2021-05-24 DIAGNOSIS — I2609 Other pulmonary embolism with acute cor pulmonale: Secondary | ICD-10-CM | POA: Diagnosis not present

## 2021-05-24 DIAGNOSIS — I2699 Other pulmonary embolism without acute cor pulmonale: Secondary | ICD-10-CM

## 2021-05-24 DIAGNOSIS — F32A Depression, unspecified: Secondary | ICD-10-CM

## 2021-05-24 DIAGNOSIS — J438 Other emphysema: Secondary | ICD-10-CM | POA: Diagnosis not present

## 2021-05-24 DIAGNOSIS — R1011 Right upper quadrant pain: Secondary | ICD-10-CM | POA: Diagnosis not present

## 2021-05-24 DIAGNOSIS — I10 Essential (primary) hypertension: Secondary | ICD-10-CM

## 2021-05-24 DIAGNOSIS — Z9861 Coronary angioplasty status: Secondary | ICD-10-CM

## 2021-05-24 DIAGNOSIS — K760 Fatty (change of) liver, not elsewhere classified: Secondary | ICD-10-CM | POA: Diagnosis not present

## 2021-05-24 DIAGNOSIS — J9811 Atelectasis: Secondary | ICD-10-CM | POA: Diagnosis not present

## 2021-05-24 LAB — D-DIMER, QUANTITATIVE: D-Dimer, Quant: 1.42 ug/mL-FEU — ABNORMAL HIGH (ref 0.00–0.50)

## 2021-05-24 LAB — TROPONIN I (HIGH SENSITIVITY)
Troponin I (High Sensitivity): 11 ng/L (ref <18)
Troponin I (High Sensitivity): 11 ng/L (ref <18)

## 2021-05-24 LAB — LIPASE, BLOOD: Lipase: 31 U/L (ref 11–51)

## 2021-05-24 LAB — RESP PANEL BY RT-PCR (FLU A&B, COVID) ARPGX2
Influenza A by PCR: NEGATIVE
Influenza B by PCR: NEGATIVE
SARS Coronavirus 2 by RT PCR: NEGATIVE

## 2021-05-24 LAB — ECHOCARDIOGRAM COMPLETE
Area-P 1/2: 4.99 cm2
S' Lateral: 3.9 cm

## 2021-05-24 MED ORDER — HEPARIN BOLUS VIA INFUSION
5000.0000 [IU] | Freq: Once | INTRAVENOUS | Status: AC
  Administered 2021-05-24: 5000 [IU] via INTRAVENOUS
  Filled 2021-05-24: qty 5000

## 2021-05-24 MED ORDER — CLONAZEPAM 0.5 MG PO TABS
0.5000 mg | ORAL_TABLET | Freq: Two times a day (BID) | ORAL | Status: DC | PRN
  Administered 2021-05-24: 0.5 mg via ORAL
  Filled 2021-05-24: qty 1

## 2021-05-24 MED ORDER — FUROSEMIDE 20 MG PO TABS
20.0000 mg | ORAL_TABLET | Freq: Every day | ORAL | Status: DC
  Administered 2021-05-24 – 2021-05-25 (×2): 20 mg via ORAL
  Filled 2021-05-24 (×3): qty 1

## 2021-05-24 MED ORDER — PANTOPRAZOLE SODIUM 40 MG PO TBEC
40.0000 mg | DELAYED_RELEASE_TABLET | Freq: Every day | ORAL | Status: DC
  Administered 2021-05-24 – 2021-05-25 (×2): 40 mg via ORAL
  Filled 2021-05-24 (×2): qty 1

## 2021-05-24 MED ORDER — PERFLUTREN LIPID MICROSPHERE
1.0000 mL | INTRAVENOUS | Status: AC | PRN
  Administered 2021-05-24: 2 mL via INTRAVENOUS
  Filled 2021-05-24: qty 10

## 2021-05-24 MED ORDER — FENTANYL CITRATE (PF) 100 MCG/2ML IJ SOLN: 25.0000 ug | INTRAMUSCULAR | Status: DC | PRN

## 2021-05-24 MED ORDER — METOPROLOL TARTRATE 12.5 MG HALF TABLET
12.5000 mg | ORAL_TABLET | Freq: Two times a day (BID) | ORAL | Status: DC
  Administered 2021-05-24 – 2021-05-25 (×3): 12.5 mg via ORAL
  Filled 2021-05-24 (×3): qty 1

## 2021-05-24 MED ORDER — BUDESONIDE 0.5 MG/2ML IN SUSP
0.5000 mg | Freq: Two times a day (BID) | RESPIRATORY_TRACT | Status: DC
  Administered 2021-05-24 – 2021-05-25 (×3): 0.5 mg via RESPIRATORY_TRACT
  Filled 2021-05-24 (×4): qty 2

## 2021-05-24 MED ORDER — ACETAMINOPHEN 650 MG RE SUPP: 650.0000 mg | Freq: Four times a day (QID) | RECTAL | Status: DC | PRN

## 2021-05-24 MED ORDER — IRBESARTAN 150 MG PO TABS
150.0000 mg | ORAL_TABLET | Freq: Every day | ORAL | Status: DC
  Administered 2021-05-24 – 2021-05-25 (×2): 150 mg via ORAL
  Filled 2021-05-24 (×2): qty 1

## 2021-05-24 MED ORDER — APIXABAN 5 MG PO TABS
10.0000 mg | ORAL_TABLET | Freq: Two times a day (BID) | ORAL | Status: DC
  Administered 2021-05-24 – 2021-05-25 (×3): 10 mg via ORAL
  Filled 2021-05-24 (×3): qty 2

## 2021-05-24 MED ORDER — HYDROMORPHONE HCL 1 MG/ML IJ SOLN
1.0000 mg | Freq: Once | INTRAMUSCULAR | Status: AC
  Administered 2021-05-24: 1 mg via INTRAVENOUS
  Filled 2021-05-24: qty 1

## 2021-05-24 MED ORDER — FENTANYL CITRATE (PF) 100 MCG/2ML IJ SOLN
50.0000 ug | Freq: Once | INTRAMUSCULAR | Status: AC
Start: 2021-05-24 — End: 2021-05-24
  Administered 2021-05-24: 50 ug via INTRAVENOUS
  Filled 2021-05-24: qty 2

## 2021-05-24 MED ORDER — HYDROCHLOROTHIAZIDE 12.5 MG PO CAPS
12.5000 mg | ORAL_CAPSULE | Freq: Every day | ORAL | Status: DC
  Administered 2021-05-24 – 2021-05-25 (×2): 12.5 mg via ORAL
  Filled 2021-05-24 (×2): qty 1

## 2021-05-24 MED ORDER — HEPARIN (PORCINE) 25000 UT/250ML-% IV SOLN
1700.0000 [IU]/h | INTRAVENOUS | Status: DC
  Administered 2021-05-24: 1700 [IU]/h via INTRAVENOUS
  Filled 2021-05-24: qty 250

## 2021-05-24 MED ORDER — SODIUM CHLORIDE 0.9 % IV SOLN: Freq: Once | INTRAVENOUS | Status: AC

## 2021-05-24 MED ORDER — ALBUTEROL SULFATE (2.5 MG/3ML) 0.083% IN NEBU: 2.5000 mg | INHALATION_SOLUTION | RESPIRATORY_TRACT | Status: DC | PRN

## 2021-05-24 MED ORDER — BUPROPION HCL ER (XL) 150 MG PO TB24
150.0000 mg | ORAL_TABLET | Freq: Every day | ORAL | Status: DC
  Administered 2021-05-24 – 2021-05-25 (×2): 150 mg via ORAL
  Filled 2021-05-24 (×2): qty 1

## 2021-05-24 MED ORDER — ALLOPURINOL 100 MG PO TABS
100.0000 mg | ORAL_TABLET | Freq: Every day | ORAL | Status: DC
  Administered 2021-05-24 – 2021-05-25 (×2): 100 mg via ORAL
  Filled 2021-05-24 (×2): qty 1

## 2021-05-24 MED ORDER — ONDANSETRON HCL 4 MG PO TABS: 4.0000 mg | ORAL_TABLET | Freq: Four times a day (QID) | ORAL | Status: DC | PRN

## 2021-05-24 MED ORDER — ONDANSETRON HCL 4 MG/2ML IJ SOLN: 4.0000 mg | Freq: Four times a day (QID) | INTRAMUSCULAR | Status: DC | PRN

## 2021-05-24 MED ORDER — OLMESARTAN MEDOXOMIL-HCTZ 20-12.5 MG PO TABS: 1.0000 | ORAL_TABLET | Freq: Every day | ORAL | Status: DC

## 2021-05-24 MED ORDER — ARFORMOTEROL TARTRATE 15 MCG/2ML IN NEBU
15.0000 ug | INHALATION_SOLUTION | Freq: Two times a day (BID) | RESPIRATORY_TRACT | Status: DC
  Administered 2021-05-24 – 2021-05-25 (×3): 15 ug via RESPIRATORY_TRACT
  Filled 2021-05-24 (×4): qty 2

## 2021-05-24 MED ORDER — ACETAMINOPHEN 325 MG PO TABS: 650.0000 mg | ORAL_TABLET | Freq: Four times a day (QID) | ORAL | Status: DC | PRN

## 2021-05-24 MED ORDER — ASPIRIN EC 81 MG PO TBEC
81.0000 mg | DELAYED_RELEASE_TABLET | Freq: Every day | ORAL | Status: DC
  Administered 2021-05-24 – 2021-05-25 (×2): 81 mg via ORAL
  Filled 2021-05-24 (×2): qty 1

## 2021-05-24 MED ORDER — ROSUVASTATIN CALCIUM 20 MG PO TABS
20.0000 mg | ORAL_TABLET | Freq: Every day | ORAL | Status: DC
  Administered 2021-05-24 – 2021-05-25 (×2): 20 mg via ORAL
  Filled 2021-05-24 (×2): qty 1

## 2021-05-24 MED ORDER — SODIUM CHLORIDE 0.9% FLUSH
3.0000 mL | Freq: Two times a day (BID) | INTRAVENOUS | Status: DC
  Administered 2021-05-24 – 2021-05-25 (×2): 3 mL via INTRAVENOUS

## 2021-05-24 MED ORDER — APIXABAN 5 MG PO TABS
5.0000 mg | ORAL_TABLET | Freq: Two times a day (BID) | ORAL | Status: DC
Start: 2021-05-31 — End: 2021-05-25

## 2021-05-24 MED ORDER — ACETAMINOPHEN 325 MG PO TABS
650.0000 mg | ORAL_TABLET | Freq: Once | ORAL | Status: AC
  Administered 2021-05-24: 650 mg via ORAL
  Filled 2021-05-24: qty 2

## 2021-05-24 MED ORDER — HYDROCODONE-ACETAMINOPHEN 5-325 MG PO TABS
1.0000 | ORAL_TABLET | Freq: Four times a day (QID) | ORAL | Status: DC | PRN
  Administered 2021-05-24 – 2021-05-25 (×3): 1 via ORAL
  Filled 2021-05-24 (×3): qty 1

## 2021-05-24 MED ORDER — IOHEXOL 350 MG/ML SOLN
100.0000 mL | Freq: Once | INTRAVENOUS | Status: AC | PRN
  Administered 2021-05-24: 100 mL via INTRAVENOUS
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
## 2021-05-24 NOTE — Discharge Instructions (Signed)
Information on my medicine - ELIQUIS (apixaban)  Why was Eliquis prescribed for you? Eliquis was prescribed to treat blood clots that may have been found in the veins of your legs (deep vein thrombosis) or in your lungs (pulmonary embolism) and to reduce the risk of them occurring again.  What do You need to know about Eliquis ? The starting dose is 10 mg (two 5 mg tablets) taken TWICE daily for the FIRST SEVEN (7) DAYS, then on 05/31/2021 the dose is reduced to ONE 5 mg tablet taken TWICE daily.  Eliquis may be taken with or without food.   Try to take the dose about the same time in the morning and in the evening. If you have difficulty swallowing the tablet whole please discuss with your pharmacist how to take the medication safely.  Take Eliquis exactly as prescribed and DO NOT stop taking Eliquis without talking to the doctor who prescribed the medication.  Stopping may increase your risk of developing a new blood clot.  Refill your prescription before you run out.  After discharge, you should have regular check-up appointments with your healthcare provider that is prescribing your Eliquis.    What do you do if you miss a dose? If a dose of ELIQUIS is not taken at the scheduled time, take it as soon as possible on the same day and twice-daily administration should be resumed. The dose should not be doubled to make up for a missed dose.  Important Safety Information A possible side effect of Eliquis is bleeding. You should call your healthcare provider right away if you experience any of the following: Bleeding from an injury or your nose that does not stop. Unusual colored urine (red or dark brown) or unusual colored stools (red or black). Unusual bruising for unknown reasons. A serious fall or if you hit your head (even if there is no bleeding).  Some medicines may interact with Eliquis and might increase your risk of bleeding or clotting while on Eliquis. To help avoid this,  consult your healthcare provider or pharmacist prior to using any new prescription or non-prescription medications, including herbals, vitamins, non-steroidal anti-inflammatory drugs (NSAIDs) and supplements.  This website has more information on Eliquis (apixaban): http://www.eliquis.com/eliquis/home

## 2021-05-24 NOTE — ED Notes (Signed)
RN attempted report x1.  

## 2021-05-24 NOTE — Progress Notes (Signed)
  Echocardiogram 2D Echocardiogram with defintiy has been performed.  Darlina Sicilian M 05/24/2021, 2:23 PM
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
## 2021-05-24 NOTE — Progress Notes (Signed)
BLE venous duplex has been completed.  Results can be found under chart review under CV PROC. 05/24/2021 3:13 PM Mayline Dragon RVT, RDMS

## 2021-05-24 NOTE — TOC Benefit Eligibility Note (Signed)
Patient Teacher, English as a foreign language completed.    The patient is currently admitted and upon discharge could be taking Eliquis 5 mg.  The current 30 day co-pay is, $15.00.   The patient is insured through [place], [place] Patient Advocate Specialist [place] Team Direct Number: [phone]  Fax: [phone]

## 2021-05-24 NOTE — ED Notes (Signed)
D-dimer redrawn and sent to lab.

## 2021-05-24 NOTE — ED Notes (Signed)
Patient transported to CT 

## 2021-05-24 NOTE — ED Notes (Signed)
Patient transported to vascular. 

## 2021-05-24 NOTE — ED Notes (Signed)
Heparin started on pt. Pt remains in the hallway. Cardiac monitor at bedside.

## 2021-05-24 NOTE — ED Notes (Signed)
Patient transported to Ultrasound 

## 2021-05-25 ENCOUNTER — Other Ambulatory Visit (HOSPITAL_COMMUNITY): Payer: Self-pay

## 2021-05-25 DIAGNOSIS — F419 Anxiety disorder, unspecified: Secondary | ICD-10-CM | POA: Diagnosis not present

## 2021-05-25 DIAGNOSIS — I2694 Multiple subsegmental pulmonary emboli without acute cor pulmonale: Secondary | ICD-10-CM | POA: Diagnosis not present

## 2021-05-25 DIAGNOSIS — F32A Depression, unspecified: Secondary | ICD-10-CM | POA: Diagnosis not present

## 2021-05-25 DIAGNOSIS — I2699 Other pulmonary embolism without acute cor pulmonale: Secondary | ICD-10-CM | POA: Diagnosis not present

## 2021-05-25 LAB — CBC
HCT: 39.7 % (ref 39.0–52.0)
Hemoglobin: 12.8 g/dL — ABNORMAL LOW (ref 13.0–17.0)
MCH: 28.4 pg (ref 26.0–34.0)
MCHC: 32.2 g/dL (ref 30.0–36.0)
MCV: 88.2 fL (ref 80.0–100.0)
Platelets: 231 10*3/uL (ref 150–400)
RBC: 4.5 MIL/uL (ref 4.22–5.81)
RDW: 15.2 % (ref 11.5–15.5)
WBC: 12.3 10*3/uL — ABNORMAL HIGH (ref 4.0–10.5)
nRBC: 0 % (ref 0.0–0.2)

## 2021-05-25 LAB — BASIC METABOLIC PANEL
Anion gap: 11 (ref 5–15)
BUN: 23 mg/dL (ref 8–23)
CO2: 29 mmol/L (ref 22–32)
Calcium: 9 mg/dL (ref 8.9–10.3)
Chloride: 93 mmol/L — ABNORMAL LOW (ref 98–111)
Creatinine, Ser: 1.23 mg/dL (ref 0.61–1.24)
GFR, Estimated: >60 mL/min (ref >60)
Glucose, Bld: 116 mg/dL — ABNORMAL HIGH (ref 70–99)
Potassium: 3.9 mmol/L (ref 3.5–5.1)
Sodium: 133 mmol/L — ABNORMAL LOW (ref 135–145)

## 2021-05-25 MED ORDER — APIXABAN 5 MG PO TABS
ORAL_TABLET | ORAL | 0 refills | Status: DC
  Filled 2021-05-25: qty 72, 30d supply, fill #0

## 2021-05-25 MED ORDER — HYDROCODONE-ACETAMINOPHEN 5-325 MG PO TABS
1.0000 | ORAL_TABLET | Freq: Four times a day (QID) | ORAL | 0 refills | Status: DC | PRN
  Filled 2021-05-25: qty 24, 6d supply, fill #0

## 2021-05-25 MED ORDER — HYDROCODONE-ACETAMINOPHEN 5-325 MG PO TABS
1.0000 | ORAL_TABLET | Freq: Four times a day (QID) | ORAL | 0 refills | Status: AC | PRN
  Filled 2021-05-25: qty 24, 3d supply, fill #0

## 2021-05-25 NOTE — Evaluation (Addendum)
Physical Therapy Evaluation Patient Details Name: Allen [name] MRN: [id number] DOB: 1946-04-22 Today's Date: 05/25/2021   History of Present Illness  75 y.o. male presenting to ED with back pain and SOB. CXR with atelectasis and right-sided pleural effusion. CT abdomen/pelvis (-). Ultrasound (+) gallbladder sludge with hepatic steatosis. Elevated D-dimer. CT angio chest (+) acute bilateral PE with small R middle lobe infarct. Started on heparin 8/4 at 0700. PMHx significant for HTN, Hx of CVA, HLD, chronic BLE edema, CAD, PVD, COPD, remote tobacco use and obesity.  Clinical Impression  Pt was seen for progressing mobility and noted his loss of O2 sat control on room air in four steps.SATURATION QUALIFICATIONS: (This note is used to comply with regulatory documentation for home oxygen)  Patient Saturations on Room Air at Rest = 93%  Patient Saturations on Room Air while Ambulating = 78%  Patient Saturations on 3 Liters of oxygen while Ambulating = 95%  Please briefly explain why patient needs home oxygen:  Cannot safely maintain sats on room air, and even on air titrated to 3L from 2L at rest. Pt was able to describe the details of home, and will have help at all times to manage safety with new use of O2.  Talked with pt about HHPT seeing him to get off use of AD quickly and recover his control of endurance.  Follow for goals of acute PT.    Follow Up Recommendations Home health PT;Supervision for mobility/OOB    Equipment Recommendations  None recommended by PT (has a RW)    Recommendations for Other Services       Precautions / Restrictions Precautions Precautions: Fall Precaution Comments: Monitor SpO2 Restrictions Weight Bearing Restrictions: No      Mobility  Bed Mobility               General bed mobility comments: Seated in recliner upon entry.    Transfers Overall transfer level: Needs assistance Equipment used: Rolling walker (2 wheeled);1 person hand held  assist Transfers: Sit to/from Stand Sit to Stand: Supervision         General transfer comment: Supervision A from low recliner; requires increased time/effort.  Ambulation/Gait Ambulation/Gait assistance: Min guard Gait Distance (Feet): 150 Feet Assistive device: IV Pole;1 person hand held assist Gait Pattern/deviations: Step-through pattern;Decreased stride length Gait velocity: reduced Gait velocity interpretation: <1.31 ft/sec, indicative of household ambulator General Gait Details: takes his time, initially wanted to try to walk without O2, down to 78% in four steps with pulse 130.  At 2L was fine for first 100' and then had drop to 87%, then added to 3L and was 95% at end of walk  Stairs            Wheelchair Mobility    Modified Rankin (Stroke Patients Only)       Balance Overall balance assessment: Mild deficits observed, not formally tested                                           Pertinent Vitals/Pain Pain Assessment: Faces Faces Pain Scale: Hurts little more Pain Location: back with deep breath Pain Descriptors / Indicators: Tender;Sharp Pain Intervention(s): Limited activity within patient's tolerance;Monitored during session    Home Living Family/patient expects to be discharged to:: Private residence Living Arrangements: Spouse/significant other;Children Available Help at Discharge: Family;Available 24 hours/day Type of Home: House Home Access: Level entry  Home Layout: One level Home Equipment: Walker - 2 wheels;Cane - single point;Shower seat;Bedside commode;Grab bars - tub/shower;Wheelchair - manual Additional Comments: has devices but walked with nothing to assist him    Prior Function Level of Independence: Independent         Comments: I with ADLs/IADLs; drives; enjoys working around the house and spending time with his 2 teenage children.     Hand Dominance   Dominant Hand: Right    Extremity/Trunk  Assessment   Upper Extremity Assessment Upper Extremity Assessment: Overall WFL for tasks assessed    Lower Extremity Assessment Lower Extremity Assessment: Overall WFL for tasks assessed    Cervical / Trunk Assessment Cervical / Trunk Assessment: Other exceptions (has cervical spine cord injury prviously) Cervical / Trunk Exceptions: Hx of cord trauma  Communication   Communication: No difficulties  Cognition Arousal/Alertness: Awake/alert Behavior During Therapy: WFL for tasks assessed/performed Overall Cognitive Status: Within Functional Limits for tasks assessed                                 General Comments: pt very positive and motivated      General Comments General comments (skin integrity, edema, etc.): Wife present at bedside.    Exercises     Assessment/Plan    PT Assessment Patient needs continued PT services  PT Problem List Cardiopulmonary status limiting activity;Decreased activity tolerance;Decreased balance       PT Treatment Interventions DME instruction;Gait training;Functional mobility training;Therapeutic activities;Therapeutic exercise;Balance training;Neuromuscular re-education;Patient/family education    PT Goals (Current goals can be found in the Care Plan section)  Acute Rehab PT Goals Patient Stated Goal: To return home. PT Goal Formulation: With patient Time For Goal Achievement: 06/01/21 Potential to Achieve Goals: Good    Frequency Min 3X/week   Barriers to discharge   will have help and home is accessible    Co-evaluation               AM-PAC PT "6 Clicks" Mobility  Outcome Measure Help needed turning from your back to your side while in a flat bed without using bedrails?: A Little Help needed moving from lying on your back to sitting on the side of a flat bed without using bedrails?: A Little Help needed moving to and from a bed to a chair (including a wheelchair)?: A Little Help needed standing up from a  chair using your arms (e.g., wheelchair or bedside chair)?: A Little Help needed to walk in hospital room?: A Little Help needed climbing 3-5 steps with a railing? : A Little 6 Click Score: 18    End of Session Equipment Utilized During Treatment: Gait belt;Oxygen Activity Tolerance: Patient limited by fatigue;Treatment limited secondary to medical complications (Comment) (sats dropping unless O2 used) Patient left: in chair;with call bell/phone within reach;with chair alarm set;with family/visitor present Nurse Communication: Mobility status PT Visit Diagnosis: Unsteadiness on feet (R26.81);Other abnormalities of gait and mobility (R26.89)    Time: 1003-1030 PT Time Calculation (min) (ACUTE ONLY): 27 min   Charges:   PT Evaluation $PT Eval Moderate Complexity: 1 Mod PT Treatments $Gait Training: 8-22 mins       Ramond Dial 05/25/2021, 2:49 PM Mee Hives, PT [place] Acute Rehab Dept. Number: [place] [facility] [place]

## 2021-05-25 NOTE — Telephone Encounter (Signed)
Patient is currently admitted in hospital.

## 2021-05-25 NOTE — Evaluation (Signed)
Occupational Therapy Evaluation Patient Details Name: Allen [name] MRN: [id number] DOB: 09-13-1946 Today's Date: 05/25/2021    History of Present Illness 75 y.o. male presenting to ED with back pain and SOB. CXR with atelectasis and right-sided pleural effusion. CT abdomen/pelvis (-). Ultrasound (+) gallbladder sludge with hepatic steatosis. Elevated D-dimer. CT angio chest (+) acute bilateral PE with small R middle lobe infarct. Started on heparin 8/4 at 0700. PMHx significant for HTN, Hx of CVA, HLD, chronic BLE edema, CAD, PVD, COPD, remote tobacco use and obesity.   Clinical Impression   PTA patient was living with his wife and 2 teenage children and was grossly I with ADLs/IADLs without AD. Patient currently demonstrating observed ADLs with supervision to [place] guard grossly. Patient also limited by deficits listed below including decreased cardiopulmonary status requiring 3L supplemental O2 via [place] and would benefit from continued acute OT services in prep for safe d/c home. OT will continue to follow acutely.      Follow Up Recommendations  No OT follow up;Other (comment) (May benefit from cardiopulmonary rehab)    Equipment Recommendations  None recommended by OT (Patient has necessary DME.)    Recommendations for Other Services       Precautions / Restrictions Precautions Precautions: Fall Precaution Comments: Monitor SpO2 Restrictions Weight Bearing Restrictions: No      Mobility Bed Mobility               General bed mobility comments: Seated in recliner upon entry.    Transfers Overall transfer level: Needs assistance Equipment used: Rolling walker (2 wheeled);1 person hand held assist Transfers: Sit to/from Stand Sit to Stand: Supervision         General transfer comment: Supervision A from low recliner; requires increased time/effort.    Balance Overall balance assessment: Mild deficits observed, not formally tested                                          ADL either performed or assessed with clinical judgement   ADL Overall ADL's : Modified independent                                             Vision   Vision Assessment?: No apparent visual deficits     Perception     Praxis      Pertinent Vitals/Pain Pain Assessment: Faces Faces Pain Scale: Hurts little more Pain Location: back with deep breath Pain Descriptors / Indicators: Tender;Sharp Pain Intervention(s): Limited activity within patient's tolerance;Monitored during session     Hand Dominance Right   Extremity/Trunk Assessment Upper Extremity Assessment Upper Extremity Assessment: Overall WFL for tasks assessed   Lower Extremity Assessment Lower Extremity Assessment: Overall WFL for tasks assessed   Cervical / Trunk Assessment Cervical / Trunk Assessment: Other exceptions (has cervical spine cord injury prviously) Cervical / Trunk Exceptions: Hx of cord trauma   Communication Communication Communication: No difficulties   Cognition Arousal/Alertness: Awake/alert Behavior During Therapy: WFL for tasks assessed/performed Overall Cognitive Status: Within Functional Limits for tasks assessed                                 General Comments: pt very positive and motivated  General Comments  Wife present at bedside.    Exercises     Shoulder Instructions      Home Living Family/patient expects to be discharged to:: Private residence Living Arrangements: Spouse/significant other;Children Available Help at Discharge: Family;Available 24 hours/day Type of Home: House Home Access: Level entry     Home Layout: One level         Bathroom Toilet: Standard     Home Equipment: Walker - 2 wheels;Cane - single point;Shower seat;Bedside commode;Grab bars - tub/shower;Wheelchair - manual   Additional Comments: has devices but walked with nothing to assist him      Prior Functioning/Environment  Level of Independence: Independent        Comments: I with ADLs/IADLs; drives; enjoys working around the house and spending time with his 2 teenage children.        OT Problem List: Cardiopulmonary status limiting activity      OT Treatment/Interventions: Self-care/ADL training;Therapeutic exercise;Energy conservation;DME and/or AE instruction;Therapeutic activities;Patient/family education;Balance training    OT Goals(Current goals can be found in the care plan section) Acute Rehab OT Goals Patient Stated Goal: To return home. OT Goal Formulation: With patient Time For Goal Achievement: 06/08/21 Potential to Achieve Goals: Good ADL Goals Additional ADL Goal #1: Patient will complete ADLs with Mod I with AD PRN. Additional ADL Goal #2: Patient will recall 3 energy conservation techniques in prep for ADLs/IADLs.  OT Frequency: Min 2X/week   Barriers to D/C:            Co-evaluation              AM-PAC OT "6 Clicks" Daily Activity     Outcome Measure Help from another person eating meals?: None Help from another person taking care of personal grooming?: None Help from another person toileting, which includes using toliet, bedpan, or urinal?: A Little Help from another person bathing (including washing, rinsing, drying)?: A Little Help from another person to put on and taking off regular upper body clothing?: None Help from another person to put on and taking off regular lower body clothing?: A Little 6 Click Score: 21   End of Session Equipment Utilized During Treatment: Gait belt;Rolling walker;Oxygen Nurse Communication: Mobility status;Other (comment) (Response to treatment)  Activity Tolerance: Patient tolerated treatment well Patient left: in chair;with call bell/phone within reach  OT Visit Diagnosis: Unsteadiness on feet (R26.81)                Time: [id number] OT Time Calculation (min): 14 min Charges:  OT General Charges $OT Visit: 1 Visit OT  Evaluation $OT Eval Low Complexity: 1 Low  Andjela Wickes H. OTR/L Supplemental OT, Department of rehab services [phone]  Jaquavian Firkus R H. 05/25/2021, 2:19 PM

## 2021-05-25 NOTE — Discharge Summary (Signed)
Physician Discharge Summary  MUKHTAR ICE [id number] DOB: 09/18/1946 DOA: 05/23/2021  PCP: [name], MD  Admit date: 05/23/2021 Discharge date: 05/25/2021  Admitted From: Home Disposition: Home  Recommendations for Outpatient Follow-up:  Follow up with PCP in 1 week Follow-up pulmonology as an outpatient for referral to cardiopulmonary rehab Please obtain BMP/CBC in one week Please follow up on the following pending results: None  Home Health: PT Equipment/Devices: Oxygen  Discharge Condition: Stable CODE STATUS: Full code Diet recommendation: Heart healthy  Brief/Interim Summary:  Admission HPI written by Norval Morton, MD   HPI: Allen [name] is a 75 y.o. male with medical history significant of hypertension, hyperlipidemia, CAD, PVD, COPD, remote tobacco abuse, and obesity presented with complaints of back pain over the last 1-2 days.  Complains of having sharp pain on the right side of his back near his kidney.  Pain is constant and worse with taking a deep inspiratory breath and certain movements.  Associated symptoms included acute worsening of shortness of breath.  He had been doing pretty heavy lifting taking things out to a dumpster and thought he had possibly strained a muscle as symptoms seem to start the morning after.  Of note patient reports that he recently had taken trips to [place] in [place], and [place] about a week ago with each trip being approximately 4-1/2-hour drive each way.  Patient reports that he drove through without stops on both trips.  He chronically has lower leg swelling that is unchanged.  Denies having any significant cough, leg pain, prior history of blood clots, nausea, vomiting, or diarrhea.   Hospital course:  Bilateral pulmonary emboli Pulmonary infarct Likely precipitated by prolonged immobility with recent travel.  Echocardiogram was significant for no evidence of right heart failure, heart dysfunction,  heart dilation.  Patient started empirically on Eliquis and has remained stable.  Bilateral lower extremity venous duplex was negative for DVT.  Continues to have some mild right-sided chest pain managed/controlled with narcotics.  PT/OT with recommendations for therapy as an outpatient.  Outpatient therapy recommending cardiopulmonary rehab and I discussed with pulmonology to facilitate referral.  Patient will need to follow-up with pulmonology will then refer him to cardiopulmonary rehab.  Patient will likely need 3 to 6 months of Eliquis use.  Acute respiratory failure with hypoxia Secondary to pulmonary embolism.  Patient requiring 2 L of oxygen during rest and up to 3 L/min of oxygen while ambulating.  Patient sent home with home oxygen.  Will need to remain as symptoms from emboli improve.  Leukocytosis Mild.  Slightly improved.  Likely related to stress event mentioned above.  No evidence of acute infection.  CAD History of NSTEMI Patient with recent cardiac catheterization in 2021 was recently on dual antiplatelet therapy with aspirin and Effient.  Patient is currently now only on aspirin.  Patient can continue aspirin on discharge.  Primary pretension Continue home olmesartan/hydrochlorothiazide.  Hyponatremia Mild.  Asymptomatic.  COPD Continue Symbicort and albuterol.  Left subclavian artery stenosis Followed with vascular surgery.  Continue aspirin and statin.  Anxiety Depression Continue Wellbutrin and Klonopin.  Hyperlipidemia Continue Crestor  Obesity Body mass index is 37.7 kg/m.   Discharge Diagnoses:  Principal Problem:   Pulmonary embolus (HCC) Active Problems:   Dyslipidemia, goal LDL below 70   Essential hypertension   CAD S/P PCI   GERD   Anxiety and depression   COPD (chronic obstructive pulmonary disease) with emphysema (HCC)   Pulmonary infarct (  [place])   Leukocytosis    Discharge Instructions  Discharge Instructions     Ambulatory referral  to Pulmonology   Complete by: As directed    Acute PE with small infarct. Patient is established with Dr. Ander Slade but requires quick follow-up for referral to cardiopulmonary rehabilitation per therapy recommendations for dyspnea on exertion.   Reason for referral: Other   Call MD for:  difficulty breathing, headache or visual disturbances   Complete by: As directed    Call MD for:  extreme fatigue   Complete by: As directed    Call MD for:  severe uncontrolled pain   Complete by: As directed       Allergies as of 05/25/2021       Reactions   Sulfa Antibiotics Hives   Advil [ibuprofen] Swelling, Other (See Comments)   Lip swelling   Atorvastatin    Muscle pain   Penicillins Hives, Other (See Comments)   Has patient had a PCN reaction causing immediate rash, facial/tongue/throat swelling, SOB or lightheadedness with hypotension: no Has patient had a PCN reaction causing severe rash involving mucus membranes or skin necrosis: no Has patient had a PCN reaction that required hospitalization no - childhood reaction Has patient had a PCN reaction occurring within the last 10 years: no - childhood reaction If all of the above answers are "NO", then may proceed with Cephalosporin use.   Sulfonamide Derivatives Hives        Medication List     TAKE these medications    albuterol 108 (90 Base) MCG/ACT inhaler Commonly known as: VENTOLIN HFA TAKE 2 PUFFS BY MOUTH EVERY 6 HOURS AS NEEDED FOR WHEEZE OR SHORTNESS OF BREATH What changed: See the new instructions.   allopurinol 100 MG tablet Commonly known as: ZYLOPRIM Take 1 tablet (100 mg total) by mouth daily.   apixaban 5 MG Tabs tablet Commonly known as: ELIQUIS Take 2 tablets (10 mg total) by mouth 2 (two) times daily for 6 days, THEN 1 tablet (5 mg total) 2 (two) times daily for 24 days. Start taking on: May 25, 2021   aspirin 81 MG EC tablet Take 1 tablet (81 mg total) by mouth daily.   budesonide-formoterol 80-4.5  MCG/ACT inhaler Commonly known as: Symbicort TAKE 2 PUFFS BY MOUTH TWICE A DAY What changed: See the new instructions.   buPROPion 150 MG 24 hr tablet Commonly known as: WELLBUTRIN XL TAKE 1 TABLET BY MOUTH EVERY DAY   clonazePAM 0.5 MG tablet Commonly known as: KLONOPIN TAKE 1 TABLET BY MOUTH TWICE A DAY AS NEEDED FOR ANXIETY What changed: See the new instructions.   colchicine 0.6 MG tablet Take 1 tablet (0.6 mg total) by mouth daily. Until flare resolves. Do not take rosuvastatin or aspirin when taking this medication. What changed:  when to take this reasons to take this   furosemide 20 MG tablet Commonly known as: LASIX Take 1 tablet (20 mg total) by mouth daily.   HYDROcodone-acetaminophen 5-325 MG tablet Commonly known as: NORCO/VICODIN Take 1 tablet by mouth every 6 (six) hours as needed for up to 5 days for moderate pain.   metoprolol tartrate 25 MG tablet Commonly known as: LOPRESSOR TAKE 0.5 TABLETS (12.5 MG TOTAL) BY MOUTH 2 (TWO) TIMES DAILY.   nitroGLYCERIN 0.4 MG SL tablet Commonly known as: NITROSTAT Place 1 tablet (0.4 mg total) under the tongue every 5 (five) minutes as needed for chest pain.   olmesartan-hydrochlorothiazide 20-12.5 MG tablet Commonly known as: BENICAR HCT TAKE  1 TABLET BY MOUTH EVERY DAY   rosuvastatin 20 MG tablet Commonly known as: CRESTOR TAKE 1 TABLET BY MOUTH EVERY DAY        Follow-up Information     [facility]. Schedule an appointment as soon as possible for a visit in 1 week(s).   Specialty: Pulmonology Why: Make aPPOINTMENT IN ONE WEEK TO OBTAIN REFERRAL FOR PULMONARY REHABILITATION IF PULMONARY AGREES Contact information: [address] [place] [phone]               Allergies  Allergen Reactions   Sulfa Antibiotics Hives   Advil [Ibuprofen] Swelling and Other (See Comments)    Lip swelling   Atorvastatin     Muscle pain   Penicillins Hives and  Other (See Comments)    Has patient had a PCN reaction causing immediate rash, facial/tongue/throat swelling, SOB or lightheadedness with hypotension: no Has patient had a PCN reaction causing severe rash involving mucus membranes or skin necrosis: no Has patient had a PCN reaction that required hospitalization no - childhood reaction Has patient had a PCN reaction occurring within the last 10 years: no - childhood reaction If all of the above answers are "NO", then may proceed with Cephalosporin use.    Sulfonamide Derivatives Hives    Consultations: None   Procedures/Studies: DG Chest 2 View  Result Date: 05/23/2021 CLINICAL DATA:  Right flank pain radiating to right chest EXAM: CHEST - 2 VIEW COMPARISON:  12/26/2019 FINDINGS: Frontal and lateral views of the chest demonstrate a stable cardiac silhouette. There is patchy bibasilar consolidation, with a small right pleural effusion. No pneumothorax. No acute bony abnormalities. IMPRESSION: 1. Patchy bibasilar consolidation compatible with atelectasis. 2. Trace right pleural effusion. Electronically Signed   By: Randa Ngo M.D.   On: 05/23/2021 20:50   CT Angio Chest PE W and/or Wo Contrast  Result Date: 05/24/2021 CLINICAL DATA:  Pulmonary embolism suspected. Low to intermediate probability. EXAM: CT ANGIOGRAPHY CHEST WITH CONTRAST TECHNIQUE: Multidetector CT imaging of the chest was performed using the standard protocol during bolus administration of intravenous contrast. Multiplanar CT image reconstructions and MIPs were obtained to evaluate the vascular anatomy. CONTRAST:  117m OMNIPAQUE IOHEXOL 350 MG/ML SOLN COMPARISON:  None. FINDINGS: Cardiovascular: Satisfactory opacification of the pulmonary arteries to the segmental level. Segmental to subsegmental pulmonary artery filling defects are seen in the lateral segment right middle lobe and posterior segment left upper lobe normal heart size. No pericardial effusion. Extensive coronary  atheromatous calcification. Mediastinum/Nodes: No adenopathy or mass. Lungs/Pleura: Small wedge of ground-glass opacity in the subpleural right middle lobe consistent with infarct. No pulmonary edema. Atelectasis at the lung bases with trace pleural fluid on the right. Paraseptal emphysema at the apices. Upper Abdomen: No acute finding. Musculoskeletal: Spondylosis with multi-level bridging osteophyte. Review of the MIP images confirms the above findings. Critical Value/emergent results were called by telephone at the time of interpretation on 05/24/2021 at 6:37 am to [facility], who verbally acknowledged these results. IMPRESSION: 1. Acute bilateral segmental to subsubmental pulmonary emboli with small right middle lobe infarct. 2. Extensive coronary atherosclerosis. Electronically Signed   By: JMonte FantasiaM.D.   On: 05/24/2021 06:37   ECHOCARDIOGRAM COMPLETE  Result Date: 05/24/2021    ECHOCARDIOGRAM REPORT   Patient Name:   Allen JUBBDate of Exam: 05/24/2021 Medical Rec #:  [id number]      Height:       72.0 in Accession #:  [id number]      Weight:       278.0 lb Date of Birth:  21-Mar-1946      BSA:          2.451 m Patient Age:    75 years        BP:           134/73 mmHg Patient Gender: M               HR:           68 bpm. Exam Location:  Inpatient Procedure: 2D Echo, Cardiac Doppler, Color Doppler and Intracardiac            Opacification Agent Indications:    Pulmonary Embolus I26.09  History:        Patient has prior history of Echocardiogram examinations, most                 recent 12/28/2019. CAD, COPD and PAD; Risk Factors:Former Smoker.  Sonographer:    Darlina Sicilian RDCS Referring Phys: [id number] RONDELL A SMITH  Sonographer Comments: Image acquisition challenging due to patient body habitus. IMPRESSIONS  1. Left ventricular ejection fraction, by estimation, is 45 to 50%. The left ventricle has mildly decreased function. The left ventricle demonstrates global hypokinesis. Left  ventricular diastolic parameters are consistent with Grade I diastolic dysfunction (impaired relaxation).  2. Right ventricular systolic function is normal. The right ventricular size is normal. Tricuspid regurgitation signal is inadequate for assessing PA pressure.  3. The mitral valve is normal in structure. No evidence of mitral valve regurgitation. No evidence of mitral stenosis.  4. The aortic valve is tricuspid. Aortic valve regurgitation is not visualized. Mild aortic valve sclerosis is present, with no evidence of aortic valve stenosis.  5. The inferior vena cava is normal in size with greater than 50% respiratory variability, suggesting right atrial pressure of 3 mmHg.  6. Technically difficult study with poor acoustic windows. FINDINGS  Left Ventricle: Left ventricular ejection fraction, by estimation, is 45 to 50%. The left ventricle has mildly decreased function. The left ventricle demonstrates global hypokinesis. Definity contrast agent was given IV to delineate the left ventricular  endocardial borders. The left ventricular internal cavity size was normal in size. There is no left ventricular hypertrophy. Left ventricular diastolic parameters are consistent with Grade I diastolic dysfunction (impaired relaxation). Right Ventricle: The right ventricular size is normal. No increase in right ventricular wall thickness. Right ventricular systolic function is normal. Tricuspid regurgitation signal is inadequate for assessing PA pressure. Left Atrium: Left atrial size was normal in size. Right Atrium: Right atrial size was normal in size. Pericardium: There is no evidence of pericardial effusion. Mitral Valve: The mitral valve is normal in structure. No evidence of mitral valve regurgitation. No evidence of mitral valve stenosis. Tricuspid Valve: The tricuspid valve is not well visualized. Tricuspid valve regurgitation is not demonstrated. Aortic Valve: The aortic valve is tricuspid. Aortic valve  regurgitation is not visualized. Mild aortic valve sclerosis is present, with no evidence of aortic valve stenosis. Pulmonic Valve: The pulmonic valve was normal in structure. Pulmonic valve regurgitation is not visualized. Aorta: The aortic root is normal in size and structure. Venous: The inferior vena cava is normal in size with greater than 50% respiratory variability, suggesting right atrial pressure of 3 mmHg. IAS/Shunts: No atrial level shunt detected by color flow Doppler.  LEFT VENTRICLE PLAX 2D LVIDd:         5.30 cm  Diastology LVIDs:  3.90 cm  LV e' medial:    4.87 cm/s LV PW:         1.00 cm  LV E/e' medial:  14.7 LV IVS:        0.90 cm  LV e' lateral:   9.36 cm/s LVOT diam:     2.30 cm  LV E/e' lateral: 7.6 LV SV:         83 LV SV Index:   34 LVOT Area:     4.15 cm  RIGHT VENTRICLE RV S prime:     15.70 cm/s TAPSE (M-mode): 2.5 cm LEFT ATRIUM             Index       RIGHT ATRIUM          Index LA diam:        3.60 cm 1.47 cm/m  RA Area:     9.27 cm LA Vol (A2C):   43.9 ml 17.91 ml/m RA Volume:   14.30 ml 5.84 ml/m LA Vol (A4C):   71.5 ml 29.18 ml/m LA Biplane Vol: 56.5 ml 23.06 ml/m  AORTIC VALVE LVOT Vmax:   90.20 cm/s LVOT Vmean:  53.100 cm/s LVOT VTI:    0.199 m  AORTA Ao Root diam: 3.00 cm Ao Asc diam:  3.20 cm MITRAL VALVE MV Area (PHT): 4.99 cm    SHUNTS MV Decel Time: 152 msec    Systemic VTI:  0.20 m MV E velocity: 71.50 cm/s  Systemic Diam: 2.30 cm MV A velocity: 78.40 cm/s MV E/A ratio:  0.91 Loralie Champagne MD Electronically signed by Loralie Champagne MD Signature Date/Time: 05/24/2021/5:34:36 PM    Final    CT Renal Stone Study  Result Date: 05/23/2021 CLINICAL DATA:  75 year old male with flank pain. Concern for kidney stone. EXAM: CT ABDOMEN AND PELVIS WITHOUT CONTRAST TECHNIQUE: Multidetector CT imaging of the abdomen and pelvis was performed following the standard protocol without IV contrast. COMPARISON:  CT of the chest abdomen pelvis dated 04/26/2014. FINDINGS: Evaluation  of this exam is limited in the absence of intravenous contrast. Lower chest: There are bibasilar subpleural densities, right greater left which may represent atelectasis or infiltrate. A subpleural hazy density in the right middle lobe may also represent atelectasis or infiltrate, although a pulmonary infarct is not excluded. Chest CT with IV contrast may provide better evaluation if there is high clinical concern for pulmonary embolism. Three vessel coronary vascular calcification. No intra-abdominal free air or free fluid. Hepatobiliary: Diffuse fatty liver. No intrahepatic biliary dilatation. The gallbladder is unremarkable. Pancreas: Unremarkable. No pancreatic ductal dilatation or surrounding inflammatory changes. Spleen: Normal in size without focal abnormality. Adrenals/Urinary Tract: The adrenal glands are unremarkable. The kidneys, visualized ureters, and urinary bladder appear unremarkable. Stomach/Bowel: There is sigmoid diverticulosis without active inflammatory changes. There is no bowel obstruction or active inflammation. The appendix is normal. Vascular/Lymphatic: Advanced aortoiliac atherosclerotic disease. The IVC is unremarkable. No portal venous gas. There is no adenopathy. Reproductive: The prostate and seminal vesicles are grossly unremarkable. No pelvic mass. Other: Small fat containing bilateral inguinal hernias. Musculoskeletal: Degenerative changes of the spine. No acute osseous pathology. IMPRESSION: 1. No acute intra-abdominal or pelvic pathology. No hydronephrosis or nephrolithiasis. 2. Fatty liver. 3. Sigmoid diverticulosis. No bowel obstruction. Normal appendix. 4. Aortic Atherosclerosis (ICD10-I70.0). Electronically Signed   By: Anner Crete M.D.   On: 05/23/2021 20:40   VAS [place] LOWER EXTREMITY VENOUS (DVT)  Result Date: 05/24/2021  Lower Venous DVT Study Patient Name:  Allen [name]  Whitham  Date of Exam:   05/24/2021 Medical Rec #: [id number]        Accession #:    [id number] Date of  Birth: November 08, 1945       Patient Gender: M Patient Age:   74Y Exam Location:  [facility] Procedure:      VAS [place] LOWER EXTREMITY VENOUS (DVT) Referring Phys: Fuller Plan --------------------------------------------------------------------------------  Indications: Pulmonary embolism.  Limitations: Body habitus and poor ultrasound/tissue interface. Comparison Study: No previous exams Performing Technologist: Jody Hill RVT, RDMS  Examination Guidelines: A complete evaluation includes B-mode imaging, spectral Doppler, color Doppler, and power Doppler as needed of all accessible portions of each vessel. Bilateral testing is considered an integral part of a complete examination. Limited examinations for reoccurring indications may be performed as noted. The reflux portion of the exam is performed with the patient in reverse Trendelenburg.  +---------+---------------+---------+-----------+----------+--------------+ RIGHT    CompressibilityPhasicitySpontaneityPropertiesThrombus Aging +---------+---------------+---------+-----------+----------+--------------+ CFV      Full           Yes      Yes                                 +---------+---------------+---------+-----------+----------+--------------+ SFJ      Full                                                        +---------+---------------+---------+-----------+----------+--------------+ FV Prox  Full           Yes      Yes                                 +---------+---------------+---------+-----------+----------+--------------+ FV Mid   Full           Yes      Yes                                 +---------+---------------+---------+-----------+----------+--------------+ FV DistalFull           Yes      Yes                                 +---------+---------------+---------+-----------+----------+--------------+ PFV      Full                                                         +---------+---------------+---------+-----------+----------+--------------+ POP      Full           Yes      Yes                                 +---------+---------------+---------+-----------+----------+--------------+ PTV      Full                                                        +---------+---------------+---------+-----------+----------+--------------+  PERO     Full                                                        +---------+---------------+---------+-----------+----------+--------------+   +---------+---------------+---------+-----------+----------+--------------+ LEFT     CompressibilityPhasicitySpontaneityPropertiesThrombus Aging +---------+---------------+---------+-----------+----------+--------------+ CFV      Full           Yes      Yes                                 +---------+---------------+---------+-----------+----------+--------------+ SFJ      Full                                                        +---------+---------------+---------+-----------+----------+--------------+ FV Prox  Full           Yes      Yes                                 +---------+---------------+---------+-----------+----------+--------------+ FV Mid   Full           Yes      Yes                                 +---------+---------------+---------+-----------+----------+--------------+ FV DistalFull           Yes      Yes                                 +---------+---------------+---------+-----------+----------+--------------+ PFV      Full                                                        +---------+---------------+---------+-----------+----------+--------------+ POP      Full           Yes      Yes                                 +---------+---------------+---------+-----------+----------+--------------+ PTV      Full                                                         +---------+---------------+---------+-----------+----------+--------------+ PERO     Full                                                        +---------+---------------+---------+-----------+----------+--------------+  Summary: BILATERAL: - No evidence of deep vein thrombosis seen in the lower extremities, bilaterally. - No evidence of superficial venous thrombosis in the lower extremities, bilaterally. -No evidence of popliteal cyst, bilaterally.   *See table(s) above for measurements and observations. Electronically signed by Servando Snare MD on 05/24/2021 at 3:45:16 PM.    Final    US Abdomen Limited RUQ (LIVER/GB)  Result Date: 05/24/2021 CLINICAL DATA:  Right upper quadrant pain EXAM: ULTRASOUND ABDOMEN LIMITED RIGHT UPPER QUADRANT COMPARISON:  CT from yesterday FINDINGS: Gallbladder: Gallbladder sludge. No discrete or shadowing stone. No focal tenderness or wall thickening Common bile duct: Diameter: 6 mm Liver: Echogenic with diminished acoustic penetration. There is pericholecystic sparing by prior CT. No evidence of mass. Portal vein is patent on color Doppler imaging with normal direction of blood flow towards the liver. Other: None. IMPRESSION: Gallbladder sludge and hepatic steatosis.  No acute finding. Electronically Signed   By: Monte Fantasia M.D.   On: 05/24/2021 05:49      Subjective: Patient reports pain with deep inspiration.  Otherwise he feels fine with no significant dyspnea while at rest.  No cough or hemoptysis.  Discharge Exam: Vitals:   05/25/21 0724 05/25/21 0725  BP:    Pulse:    Resp:    Temp:    SpO2: 96% 96%   Vitals:   05/25/21 0456 05/25/21 0710 05/25/21 0724 05/25/21 0725  BP: 133/72     Pulse: 69     Resp: 20     Temp: 100 F (37.8 C)     TempSrc:      SpO2: 95% 97% 96% 96%  Weight:      Height:        General: Pt is alert, awake, not in acute distress Cardiovascular: RRR, S1/S2 +, no rubs, no gallops Respiratory: CTA bilaterally, no  wheezing, no rhonchi Abdominal: Soft, NT, ND, bowel sounds + Extremities: no edema, no cyanosis    The results of significant diagnostics from this hospitalization (including imaging, microbiology, ancillary and laboratory) are listed below for reference.     Microbiology: Recent Results (from the past 240 hour(s))  Resp Panel by RT-PCR (Flu A&B, Covid) Nasopharyngeal Swab     Status: None   Collection Time: 05/24/21  6:40 AM   Specimen: Nasopharyngeal Swab; Nasopharyngeal(NP) swabs in vial transport medium  Result Value Ref Range Status   SARS Coronavirus 2 by RT PCR NEGATIVE NEGATIVE Final    Comment: (NOTE) SARS-CoV-2 target nucleic acids are NOT DETECTED.  The SARS-CoV-2 RNA is generally detectable in upper respiratory specimens during the acute phase of infection. The lowest concentration of SARS-CoV-2 viral copies this assay can detect is 138 copies/mL. A negative result does not preclude SARS-Cov-2 infection and should not be used as the sole basis for treatment or other patient management decisions. A negative result may occur with  improper specimen collection/handling, submission of specimen other than nasopharyngeal swab, presence of viral mutation(s) within the areas targeted by this assay, and inadequate number of viral copies(<138 copies/mL). A negative result must be combined with clinical observations, patient history, and epidemiological information. The expected result is Negative.  Fact Sheet for Patients:  EntrepreneurPulse.com.au  Fact Sheet for Healthcare Providers:  IncredibleEmployment.be  This test is no t yet approved or cleared by the [place] FDA and  has been authorized for detection and/or diagnosis of SARS-CoV-2 by FDA under an Emergency Use Authorization (EUA). This EUA will remain  in effect (meaning this test can be  used) for the duration of the COVID-19 declaration under Section 564(b)(1) of the Act,  21 U.S.C.section 360bbb-3(b)(1), unless the authorization is terminated  or revoked sooner.       Influenza A by PCR NEGATIVE NEGATIVE Final   Influenza B by PCR NEGATIVE NEGATIVE Final    Comment: (NOTE) The Xpert Xpress SARS-CoV-2/FLU/RSV plus assay is intended as an aid in the diagnosis of influenza from Nasopharyngeal swab specimens and should not be used as a sole basis for treatment. Nasal washings and aspirates are unacceptable for Xpert Xpress SARS-CoV-2/FLU/RSV testing.  Fact Sheet for Patients: EntrepreneurPulse.com.au  Fact Sheet for Healthcare Providers: IncredibleEmployment.be  This test is not yet approved or cleared by the [place] FDA and has been authorized for detection and/or diagnosis of SARS-CoV-2 by FDA under an Emergency Use Authorization (EUA). This EUA will remain in effect (meaning this test can be used) for the duration of the COVID-19 declaration under Section 564(b)(1) of the Act, 21 U.S.C. section 360bbb-3(b)(1), unless the authorization is terminated or revoked.  Performed at [facility], [place] [address] [place], [place] [address]      Labs: BNP (last 3 results) No results for input(s): BNP in the last 8760 hours. Basic Metabolic Panel: Recent Labs  Lab 05/23/21 1943 05/25/21 0101  NA 132* 133*  K 3.7 3.9  CL 96* 93*  CO2 25 29  GLUCOSE 121* 116*  BUN 25* 23  CREATININE 1.25* 1.23  CALCIUM 9.1 9.0   Liver Function Tests: Recent Labs  Lab 05/23/21 1943  AST 17  ALT 17  ALKPHOS 70  BILITOT 0.7  PROT 7.8  ALBUMIN 4.1   Recent Labs  Lab 05/24/21 0244  LIPASE 31   No results for input(s): AMMONIA in the last 168 hours. CBC: Recent Labs  Lab 05/23/21 1943 05/25/21 0101  WBC 13.6* 12.3*  NEUTROABS 10.9*  --   HGB 13.6 12.8*  HCT 43.2 39.7  MCV 89.8 88.2  PLT 263 231   Cardiac Enzymes: No results for input(s): CKTOTAL, CKMB, CKMBINDEX, TROPONINI in the last 168  hours. BNP: Invalid input(s): POCBNP CBG: No results for input(s): GLUCAP in the last 168 hours. D-Dimer Recent Labs    05/24/21 0400  DDIMER 1.42*   Hgb A1c No results for input(s): HGBA1C in the last 72 hours. Lipid Profile No results for input(s): CHOL, HDL, LDLCALC, TRIG, CHOLHDL, LDLDIRECT in the last 72 hours. Thyroid function studies No results for input(s): TSH, T4TOTAL, T3FREE, THYROIDAB in the last 72 hours.  Invalid input(s): FREET3 Anemia work up No results for input(s): VITAMINB12, FOLATE, FERRITIN, TIBC, IRON, RETICCTPCT in the last 72 hours. Urinalysis    Component Value Date/Time   COLORURINE YELLOW 05/23/2021 1942   APPEARANCEUR HAZY (A) 05/23/2021 1942   LABSPEC 1.015 05/23/2021 1942   PHURINE 5.0 05/23/2021 1942   GLUCOSEU NEGATIVE 05/23/2021 1942   GLUCOSEU NEGATIVE 04/13/2010 1052   HGBUR NEGATIVE 05/23/2021 1942   BILIRUBINUR NEGATIVE 05/23/2021 1942   KETONESUR NEGATIVE 05/23/2021 1942   PROTEINUR NEGATIVE 05/23/2021 1942   UROBILINOGEN 0.2 04/13/2010 1052   NITRITE NEGATIVE 05/23/2021 1942   LEUKOCYTESUR NEGATIVE 05/23/2021 1942   Sepsis Labs Invalid input(s): PROCALCITONIN,  WBC,  LACTICIDVEN Microbiology Recent Results (from the past 240 hour(s))  Resp Panel by RT-PCR (Flu A&B, Covid) Nasopharyngeal Swab     Status: None   Collection Time: 05/24/21  6:40 AM   Specimen: Nasopharyngeal Swab; Nasopharyngeal(NP) swabs in vial transport medium  Result Value Ref Range Status  SARS Coronavirus 2 by RT PCR NEGATIVE NEGATIVE Final    Comment: (NOTE) SARS-CoV-2 target nucleic acids are NOT DETECTED.  The SARS-CoV-2 RNA is generally detectable in upper respiratory specimens during the acute phase of infection. The lowest concentration of SARS-CoV-2 viral copies this assay can detect is 138 copies/mL. A negative result does not preclude SARS-Cov-2 infection and should not be used as the sole basis for treatment or other patient management  decisions. A negative result may occur with  improper specimen collection/handling, submission of specimen other than nasopharyngeal swab, presence of viral mutation(s) within the areas targeted by this assay, and inadequate number of viral copies(<138 copies/mL). A negative result must be combined with clinical observations, patient history, and epidemiological information. The expected result is Negative.  Fact Sheet for Patients:  EntrepreneurPulse.com.au  Fact Sheet for Healthcare Providers:  IncredibleEmployment.be  This test is no t yet approved or cleared by the [place] FDA and  has been authorized for detection and/or diagnosis of SARS-CoV-2 by FDA under an Emergency Use Authorization (EUA). This EUA will remain  in effect (meaning this test can be used) for the duration of the COVID-19 declaration under Section 564(b)(1) of the Act, 21 U.S.C.section 360bbb-3(b)(1), unless the authorization is terminated  or revoked sooner.       Influenza A by PCR NEGATIVE NEGATIVE Final   Influenza B by PCR NEGATIVE NEGATIVE Final    Comment: (NOTE) The Xpert Xpress SARS-CoV-2/FLU/RSV plus assay is intended as an aid in the diagnosis of influenza from Nasopharyngeal swab specimens and should not be used as a sole basis for treatment. Nasal washings and aspirates are unacceptable for Xpert Xpress SARS-CoV-2/FLU/RSV testing.  Fact Sheet for Patients: EntrepreneurPulse.com.au  Fact Sheet for Healthcare Providers: IncredibleEmployment.be  This test is not yet approved or cleared by the [place] FDA and has been authorized for detection and/or diagnosis of SARS-CoV-2 by FDA under an Emergency Use Authorization (EUA). This EUA will remain in effect (meaning this test can be used) for the duration of the COVID-19 declaration under Section 564(b)(1) of the Act, 21 U.S.C. section 360bbb-3(b)(1), unless the  authorization is terminated or revoked.  Performed at [facility], [place] [address] [place], [place] [address]      SIGNED:   Cordelia Poche, MD [facility] 05/25/2021, 3:01 PM

## 2021-05-25 NOTE — TOC Initial Note (Addendum)
Transition of Care [facility]) - Initial/Assessment Note    Patient Details  Name: Allen [name] MRN: [id number] Date of Birth: Aug 21, 1946  [facility] CM/SW Contact:    Verdell Carmine, RN Phone Number: 05/25/2021, 10:12 AM  Clinical Narrative:                 Patent admitted with back discomfort SHOB, found to have PE eliquis eligibility done, 15 dollar co=pay.. Follow up with PCP wil be needed. Cm will follow for transitions and neess 1400 Discussed with MD, patient will have outpatient pulmonary rehab and PT [place] to [place] later today   1515 called [employer] for oxygen explained to patient.  Expected Discharge Plan: Home/Self Care Barriers to Discharge: Continued Medical Work up   Patient Goals and CMS Choice        Expected Discharge Plan and Services Expected Discharge Plan: Home/Self Care       Living arrangements for the past 2 months: Single Family Home                                      Prior Living Arrangements/Services Living arrangements for the past 2 months: Single Family Home Lives with:: Spouse Patient language and need for interpreter reviewed:: Yes        Need for Family Participation in Patient Care: Yes (Comment) Care giver support system in place?: Yes (comment)   Criminal Activity/Legal Involvement Pertinent to Current Situation/Hospitalization: No - Comment as needed  Activities of Daily Living Home Assistive Devices/Equipment: Cane (specify quad or straight), Dentures (specify type) ADL Screening (condition at time of admission) Patient's cognitive ability adequate to safely complete daily activities?: Yes Is the patient deaf or have difficulty hearing?: No Does the patient have difficulty seeing, even when wearing glasses/contacts?: No Does the patient have difficulty concentrating, remembering, or making decisions?: No Patient able to express need for assistance with ADLs?: Yes Does the patient have difficulty dressing or  bathing?: No Independently performs ADLs?: Yes (appropriate for developmental age) Does the patient have difficulty walking or climbing stairs?: No Weakness of Legs: None Weakness of Arms/Hands: None  Permission Sought/Granted                  Emotional Assessment       Orientation: : Oriented to Self, Oriented to Place, Oriented to  Time, Oriented to Situation Alcohol / Substance Use: Not Applicable Psych Involvement: No (comment)  Admission diagnosis:  Pulmonary embolus ([place]) [I26.99] RUQ pain [R10.11] Multiple subsegmental pulmonary emboli without acute cor pulmonale (HCC) [I26.94] Patient Active Problem List   Diagnosis Date Noted   Pulmonary embolus ([place]) 05/24/2021   Pulmonary infarct ([place]) 05/24/2021   Leukocytosis 05/24/2021   Status post coronary artery stent placement    History of non-ST elevation myocardial infarction (NSTEMI) 12/27/2019   Lower extremity edema 03/26/2019   Nocturia more than twice per night 11/26/2018   Subclavian artery stenosis, left ([place]) 01/12/2018   Morbid obesity ([place]) 02/19/2016   Left shoulder pain 02/19/2016   Anxiety state 02/19/2016   Chronic neck pain 09/17/2015   Gastric mass - submucsaol - antrum 03/03/2015   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  [place] [id number]: [phone]: [phone]    Social Determinants of Health (SDOH) Interventions    Readmission Risk Interventions No flowsheet data found.

## 2021-06-01 ENCOUNTER — Ambulatory Visit: Payer: Medicare Other | Admitting: Adult Health

## 2021-06-01 ENCOUNTER — Telehealth: Payer: Self-pay | Admitting: Family Medicine

## 2021-06-01 ENCOUNTER — Other Ambulatory Visit: Payer: Self-pay

## 2021-06-01 DIAGNOSIS — I252 Old myocardial infarction: Secondary | ICD-10-CM | POA: Diagnosis not present

## 2021-06-01 DIAGNOSIS — I2694 Multiple subsegmental pulmonary emboli without acute cor pulmonale: Secondary | ICD-10-CM | POA: Diagnosis not present

## 2021-06-01 DIAGNOSIS — J438 Other emphysema: Secondary | ICD-10-CM

## 2021-06-01 DIAGNOSIS — J9611 Chronic respiratory failure with hypoxia: Secondary | ICD-10-CM | POA: Diagnosis not present

## 2021-06-01 MED ORDER — APIXABAN 5 MG PO TABS: 5.0000 mg | ORAL_TABLET | Freq: Two times a day (BID) | ORAL | 5 refills | Status: DC

## 2021-06-01 NOTE — Assessment & Plan Note (Signed)
Continue on Symbicort

## 2021-06-01 NOTE — Progress Notes (Signed)
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 nasal gel Twice daily  .  Use oxygen to keep sats >90%.  Follow up with Cardiology in 2 weeks as planned.  Follow up with 1 month with Dr. Ander Slade or Niema Carrara NP and As needed   Please contact office for sooner follow up if symptoms do not improve or worsen or seek emergency care       COPD (chronic obstructive pulmonary disease) with emphysema ([place]) Continue on Symbicort   Chronic respiratory failure with hypoxia ([place]) Decreased Oxygen demands.  Use oxygen 2l/m As needed  , goal O2 sats >88-90%   Plan  Patient Instructions  Symbicort 2 puffs Twice daily, rinse after use.  Continue on Eliquis '5mg'$  Twice daily  Saline nasal gel Twice daily  .  Use oxygen to keep sats >90%.  Follow up with Cardiology in 2 weeks as planned.  Follow up with 1 month with Dr. Ander Slade or Julien Oscar NP and As needed   Please contact office for sooner follow up if symptoms do not improve or worsen or seek emergency care       History of  non-ST elevation myocardial infarction (NSTEMI) Hx of CAD Echo with no sign RV dysfunction  EF slightly lower w/ LV hypokinesis - keep Ov with cards in 2 weeks and As needed      I spent   41 minutes dedicated to the care of this patient on the date of this encounter to include pre-visit review of records, face-to-face time with the patient discussing conditions above, post visit ordering of testing, clinical documentation with the electronic health record, making appropriate referrals as documented, and communicating necessary findings to members of the patients care team.   Rexene Edison, NP 06/01/2021

## 2021-06-01 NOTE — Assessment & Plan Note (Signed)
Hx of CAD Echo with no sign RV dysfunction  EF slightly lower w/ LV hypokinesis - keep Ov with cards in 2 weeks and As needed

## 2021-06-01 NOTE — Assessment & Plan Note (Signed)
Bilateral PE- ?provoked with back to back long car travel. Had Covid 19 in April 2022.  Would consider 6 months of Eliquis . After discontinuation , check hypercoagulable panel.  Echo with no right sided failure .  Continue on Eliquis starter pack then transition to Eliquis '5mg'$  Twice daily  . Patient education given .   Plan  Patient Instructions  Symbicort 2 puffs Twice daily, rinse after use.  Continue on Eliquis '5mg'$  Twice daily  Saline nasal gel Twice daily  .  Use oxygen to keep sats >90%.  Follow up with Cardiology in 2 weeks as planned.  Follow up with 1 month with Dr. Ander Slade or Majed Pellegrin NP and As needed   Please contact office for sooner follow up if symptoms do not improve or worsen or seek emergency care

## 2021-06-01 NOTE — Patient Instructions (Addendum)
Symbicort 2 puffs Twice daily, rinse after use.  Continue on Eliquis '5mg'$  Twice daily  Saline nasal gel Twice daily  .  Use oxygen to keep sats >90%.  Follow up with Cardiology in 2 weeks as planned.  Follow up with 1 month with Dr. Ander Slade or Dasani Crear NP and As needed   Please contact office for sooner follow up if symptoms do not improve or worsen or seek emergency care

## 2021-06-01 NOTE — Assessment & Plan Note (Signed)
Decreased Oxygen demands.  Use oxygen 2l/m As needed  , goal O2 sats >88-90%   Plan  Patient Instructions  Symbicort 2 puffs Twice daily, rinse after use.  Continue on Eliquis '5mg'$  Twice daily  Saline nasal gel Twice daily  .  Use oxygen to keep sats >90%.  Follow up with Cardiology in 2 weeks as planned.  Follow up with 1 month with Dr. Ander Slade or Hadleigh Felber NP and As needed   Please contact office for sooner follow up if symptoms do not improve or worsen or seek emergency care

## 2021-06-04 ENCOUNTER — Telehealth (HOSPITAL_COMMUNITY): Payer: Self-pay

## 2021-06-04 ENCOUNTER — Other Ambulatory Visit (HOSPITAL_COMMUNITY): Payer: Self-pay

## 2021-06-04 NOTE — Telephone Encounter (Signed)
Pharmacy Transitions of Care Follow-up Telephone Call  Date of discharge: 05/25/21  Discharge Diagnosis: PE  How have you been since you were released from the hospital?  Spoke with patient and his wife on the phone. Wife is managing patient's medications. Patient has taken an additional 3 days of Eliquis 10 mg BID but was advised to start maintenance dosing today (06/04/21).  Medication changes made at discharge:  - START: Eliquis  Medication changes verified by the patient? Yes    Medication Accessibility:  Home Pharmacy: not discussed   Was the patient provided with refills on discharged medications? No   Have all prescriptions been transferred from [facility] to home pharmacy? Eliquis maintenance refills have been sent to home pharmacy    Is the patient able to afford medications? Patient has [employer]    Medication Review:  APIXABAN (ELIQUIS)  Apixaban 10 mg BID initiated on 05/25/21. Will switch to apixaban 5 mg BID after 7 days (DATE 06/01/21). Wife is managing medications and she actually continued to give patient the '10mg'$  BID dose until today. Advised patient to reduce dose to '5mg'$  BID maintenance dosing. - Discussed importance of taking medication around the same time everyday  - Advised patient of medications to avoid (NSAIDs, ASA)  - Educated that Tylenol (acetaminophen) will be the preferred analgesic to prevent risk of bleeding  - Emphasized importance of monitoring for signs and symptoms of bleeding (abnormal bruising, prolonged bleeding, nose bleeds, bleeding from gums, discolored urine, black tarry stools)  - Advised patient to alert all providers of anticoagulation therapy prior to starting a new medication or having a procedure    Follow-up Appointments:  PCP Hospital f/u appt confirmed? Scheduled to see Family Medicine on 1/9/223.   [facility] f/u appt confirmed? Scheduled to see Dr. Isaac Laud on 06/18/21 @ Cardiology.   If their condition worsens, is  the pt aware to call PCP or go to the Emergency Dept.? Yes  Final Patient Assessment: Patient has refills at home pharmacy and follow up scheduled

## 2021-06-08 ENCOUNTER — Other Ambulatory Visit: Payer: Self-pay

## 2021-06-08 MED ORDER — OLMESARTAN MEDOXOMIL-HCTZ 20-12.5 MG PO TABS: 1.0000 | ORAL_TABLET | Freq: Every day | ORAL | 0 refills | Status: AC

## 2021-06-08 NOTE — Telephone Encounter (Signed)
Called patient back, no answer. Left a voicemail message for the patient to call back.

## 2021-06-08 NOTE — Telephone Encounter (Signed)
Patient called back. I explained that he needed a follow up on his bloodpressure. Patient voiced understanding and states he will schedule. Medication sent to patient's pharmacy.

## 2021-06-08 NOTE — Telephone Encounter (Signed)
Please follow back up with [employer] and patient in regard to medication denial.  [place] - Medicare Quality  [place]

## 2021-06-13 ENCOUNTER — Other Ambulatory Visit: Payer: Self-pay

## 2021-06-13 ENCOUNTER — Encounter: Payer: Self-pay | Admitting: Podiatry

## 2021-06-13 ENCOUNTER — Telehealth: Payer: Medicare Other

## 2021-06-13 ENCOUNTER — Ambulatory Visit (INDEPENDENT_AMBULATORY_CARE_PROVIDER_SITE_OTHER): Payer: Medicare Other | Admitting: Podiatry

## 2021-06-13 DIAGNOSIS — M79674 Pain in right toe(s): Secondary | ICD-10-CM | POA: Diagnosis not present

## 2021-06-13 DIAGNOSIS — M79675 Pain in left toe(s): Secondary | ICD-10-CM | POA: Diagnosis not present

## 2021-06-13 DIAGNOSIS — B351 Tinea unguium: Secondary | ICD-10-CM

## 2021-06-14 ENCOUNTER — Encounter: Payer: Self-pay | Admitting: Podiatry

## 2021-06-14 NOTE — Progress Notes (Signed)
  Subjective:  Patient ID: Allen [name], male    DOB: [birth year],  MRN: [id number]  Chief Complaint  Patient presents with   Nail Problem    Nail trim    75 y.o. male returns for the above complaint.  Patient presents with thickened elongated dystrophic toenails x10.  Pain on palpation.  Patient would like to have them debrided down.  He cannot do it himself.  He denies any other acute complaints.  He is not a diabetic  Objective:  There were no vitals filed for this visit. Podiatric Exam: Vascular: dorsalis pedis and posterior tibial pulses are palpable bilateral. Capillary return is immediate. Temperature gradient is WNL. Skin turgor WNL  Sensorium: Normal Semmes Weinstein monofilament test. Normal tactile sensation bilaterally. Nail Exam: Pt has thick disfigured discolored nails with subungual debris noted bilateral entire nail hallux through fifth toenails.  Pain on palpation to the nails. Ulcer Exam: There is no evidence of ulcer or pre-ulcerative changes or infection. Orthopedic Exam: Muscle tone and strength are WNL. No limitations in general ROM. No crepitus or effusions noted.  Skin: No Porokeratosis. No infection or ulcers    Assessment & Plan:  No diagnosis found.  Patient was evaluated and treated and all questions answered.  Onychomycosis with pain  -Nails palliatively debrided as below. -Educated on self-care  Procedure: Nail Debridement Rationale: pain  Type of Debridement: manual, sharp debridement. Instrumentation: Nail nipper, rotary burr. Number of Nails: 10  Procedures and Treatment: Consent by patient was obtained for treatment procedures. The patient understood the discussion of treatment and procedures well. All questions were answered thoroughly reviewed. Debridement of mycotic and hypertrophic toenails, 1 through 5 bilateral and clearing of subungual debris. No ulceration, no infection noted.  Return Visit-Office Procedure: Patient instructed to  return to the office for a follow up visit 3 months for continued evaluation and treatment.  Boneta Lucks, DPM    Return in about 3 months (around 09/13/2021) for mayer.

## 2021-06-18 ENCOUNTER — Encounter: Payer: Medicare Other | Admitting: Physician Assistant

## 2021-06-18 NOTE — Progress Notes (Signed)
This encounter was created in error - please disregard.

## 2021-06-20 ENCOUNTER — Ambulatory Visit (INDEPENDENT_AMBULATORY_CARE_PROVIDER_SITE_OTHER): Payer: Medicare Other

## 2021-06-20 DIAGNOSIS — I2510 Atherosclerotic heart disease of native coronary artery without angina pectoris: Secondary | ICD-10-CM

## 2021-06-20 DIAGNOSIS — E785 Hyperlipidemia, unspecified: Secondary | ICD-10-CM | POA: Diagnosis not present

## 2021-06-20 DIAGNOSIS — I10 Essential (primary) hypertension: Secondary | ICD-10-CM

## 2021-06-20 DIAGNOSIS — J438 Other emphysema: Secondary | ICD-10-CM | POA: Diagnosis not present

## 2021-06-20 DIAGNOSIS — Z9861 Coronary angioplasty status: Secondary | ICD-10-CM | POA: Diagnosis not present

## 2021-06-20 DIAGNOSIS — I2694 Multiple subsegmental pulmonary emboli without acute cor pulmonale: Secondary | ICD-10-CM

## 2021-06-20 NOTE — Patient Instructions (Signed)
Visit Information   PATIENT GOALS:   Goals Addressed             This Visit's Progress    Manage My Cholesterol       Timeframe:  Long-Range Goal Priority:  Medium Start Date:      06/20/21                       Expected End Date:      11/21/21                 Follow Up Date 07/18/21    - change to whole grain breads, cereal, pasta - eat smaller or less servings of red meat - fill half the plate with nonstarchy vegetables - get blood test (fasting) done 1 week before next visit - increase the amount of fiber in food - read food labels for fat and fiber - switch to low-fat or skim milk    Why is this important?   Changing cholesterol starts with eating heart-healthy foods.  Other steps may be to increase your activity and to quit if you smoke.    Notes:      Track and Manage My Blood Pressure-Hypertension       Timeframe:  Long-Range Goal Priority:  Medium Start Date:    8/3//22                         Expected End Date:    11/21/21                   Follow Up Date 07/18/21    - check blood pressure weekly - choose a place to take my blood pressure (home, clinic or office, retail store) - write blood pressure results in a log or diary    Why is this important?   You won't feel high blood pressure, but it can still hurt your blood vessels.  High blood pressure can cause heart or kidney problems. It can also cause a stroke.  Making lifestyle changes like losing a little weight or eating less salt will help.  Checking your blood pressure at home and at different times of the day can help to control blood pressure.  If the doctor prescribes medicine remember to take it the way the doctor ordered.  Call the office if you cannot afford the medicine or if there are questions about it.     Notes:      Track and Manage My Symptoms-COPD       Timeframe:  Long-Range Goal Priority:  High Start Date:     06/20/21                        Expected End Date: 11/21/21                       Follow Up Date 07/18/21    - develop a rescue plan - eliminate symptom triggers at home - follow rescue plan if symptoms flare-up - keep follow-up appointments - use an extra pillow to sleep    Why is this important?   Tracking your symptoms and other information about your health helps your doctor plan your care.  Write down the symptoms, the time of day, what you were doing and what medicine you are taking.  You will soon learn how to manage your symptoms.     Notes:  COPD Action Plan A COPD action plan is a description of what to do when you have a flare (exacerbation) of chronic obstructive pulmonary disease (COPD). Your action plan is a color-coded plan that lists the symptoms that indicate whether your condition is under control and what actions to take. If you have symptoms in the green zone, it means you are doing well that day. If you have symptoms in the yellow zone, it means you are having a bad day or an exacerbation. If you have symptoms in the red zone, you need urgent medical care. Follow the plan that you and your health care provider developed. Review your plan with your health care provider at each visit. Red zone Symptoms in this zone mean that you should get medical help right away. They include: Feeling very short of breath, even when you are resting. Not being able to do any activities because of poor breathing. Not being able to sleep because of poor breathing. Fever or shaking chills. Feeling confused or very sleepy. Chest pain. Coughing up blood. If you have any of these symptoms, call emergency services (911 in the [place] or go to the nearest emergency room. Yellow zone Symptoms in this zone mean that your condition may be getting worse. They include: Feeling more short of breath than usual. Having less energy for daily activities than usual. Phlegm or mucus that is thicker than usual. Needing to use your rescue inhaler or nebulizer more often  than usual. More ankle swelling than usual. Coughing more than usual. Feeling like you have a chest cold. Trouble sleeping due to COPD symptoms. Decreased appetite. COPD medicines not helping as much as usual. If you experience any "yellow" symptoms: Keep taking your daily medicines as directed. Use your quick-relief inhaler as told by your health care provider. If you were prescribed steroid medicine to take by mouth (oral medicine), start taking it as told by your health care provider. If you were prescribed an antibiotic medicine, start taking it as told by your health care provider. Do not stop taking the antibiotic even if you start to feel better. Use oxygen as told by your health care provider. Get more rest. Do your pursed-lip breathing exercises. Do not smoke. Avoid any irritants in the air. If your signs and symptoms do not improve after taking these steps, call your health care provider right away. Green zone Symptoms in this zone mean that you are doing well. They include: Being able to do your usual activities and exercise. Having the usual amount of coughing, including the same amount of phlegm or mucus. Being able to sleep well. Having a good appetite. Where to find more information: You can find more information about COPD from: American Lung Association, My COPD Action Plan: www.lung.org COPD Foundation: www.copdfoundation.[place]: https://wilson-eaton.com/ Follow these instructions at home: Continue taking your daily medicines as told by your health care provider. Make sure you receive all the immunizations that your health care provider recommends, especially the pneumococcal and influenza vaccines. Wash your hands often with soap and water. Have family members wash their hands too. Regular hand washing can help prevent infections. Follow your usual exercise and diet plan. Avoid irritants in the air, such as smoke. Do not use any  products that contain nicotine or tobacco. These products include cigarettes, chewing tobacco, and vaping devices, such as e-cigarettes. If you need help quitting, ask your health care provider. Summary A COPD action plan tells you what to do  when you have a flare (exacerbation) of chronic obstructive pulmonary disease (COPD). Follow each action plan for your symptoms. If you have any symptoms in the red zone, call emergency services (911 in the [place] or go to the nearest emergency room. This information is not intended to replace advice given to you by your health care provider. Make sure you discuss any questions you have with your health care provider. Document Revised: 08/15/2020 Document Reviewed: 08/15/2020 Elsevier Patient Education  2022 [employer].   Consent to CCM Services: Allen [name] was given information about Chronic Care Management services including:  CCM service includes personalized support from designated clinical staff supervised by his physician, including individualized plan of care and coordination with other care providers 24/7 contact phone numbers for assistance for urgent and routine care needs. Service will only be billed when office clinical staff spend 20 minutes or more in a month to coordinate care. Only one practitioner may furnish and bill the service in a calendar month. The patient may stop CCM services at any time (effective at the end of the month) by phone call to the office staff. The patient will be responsible for cost sharing (co-pay) of up to 20% of the service fee (after annual deductible is met).  Patient agreed to services and verbal consent obtained.   Patient verbalizes understanding of instructions provided today and agrees to view in [place].   Telephone follow up appointment with care management team member scheduled for:  Peter Garter RN, [facility], CDE Care Management Coordinator [facility] [phone],  Mobile [phone]   CLINICAL CARE PLAN: Patient Care Plan: Cardiovascular disease (CAD, Pulmonary embolus, HTN and HLD)     Problem Identified: Need for Cardiovascular disease (CAD, Pulmonary embolus, HTN and HLD)self managment   Priority: High     Long-Range Goal: Effective Cardiovascular disease (CAD,Pulmonary embolus, HTN and HLD) self management   Start Date: 06/20/2021  Expected End Date: 11/21/2021  This Visit's Progress: On track  Priority: High  Note:   Current Barriers:  Knowledge deficits related to self health management of Cardiovascular disease (CAD, Pulmonary embolus, HTN and HLD) Knowledge Deficits related to self management of Cardiovascular disease (CAD, Pulmonary embolus, HTN and HLD) Chronic Disease Management support and education needs related to self management of Cardiovascular disease (CAD, Pulmonary embolus, HTN and HLD) Unable to independently self manage Cardiovascular disease (CAD, Pulmonary embolus, HTN and HLD) States that he is getting stronger since coming home from the hospital.  Denies any chest pains or swelling.  States his daughter checks his B/P and it has been good.  States that he is now taking his medications as ordered and using a medication box Nurse Case Manager Clinical Goal(s):  patient will take all medications exactly as prescribed and will call provider for medication related questions patient will verbalize understanding of cardiovascular symptoms and when to call doctor patient will verbalize understanding of plan for self management of Cardiovascular disease (CAD, Pulmonary embolus, HTN and HLD) patient will attend all scheduled medical appointments: pulmonary 07/02/21, cardiology 08/16/21 patient will demonstrate improved adherence to prescribed treatment plan for self management of Cardiovascular disease (CAD, Pulmonary embolus, HTN and HLD) patient will verbalize basic understanding of  Cardiovascular disease (CAD, Pulmonary embolus,  HTN and HLD) disease process and self health management plan patient will demonstrate understanding of rationale for each prescribed medication the patient will demonstrate ongoing self health care management ability Interventions:  Collaboration with [name], MD regarding development and update of  comprehensive plan of care as evidenced by provider attestation and co-signature Inter-disciplinary care team collaboration (see longitudinal plan of care) Basic overview and discussion of CAD Medications reviewed Evaluation of current treatment plan related to self management of Cardiovascular disease (CAD, Pulmonary embolus, HTN and HLD) and patient's adherence to plan as established by provider. Provided education to patient re: self management of Cardiovascular disease (CAD, Pulmonary embolus, HTN and HLD) Advised patient, providing education and rationale, to monitor blood pressure daily and record, calling provider for findings outside established parameters.  Provided patient with written educational materials related to self management of Cardiovascular disease (CAD, Pulmonary embolus, HTN and HLD) Reviewed scheduled/upcoming provider appointments including:  Discussed plans with patient for ongoing care management follow up and provided patient with direct contact information for care management team Reviewed to avoid saturated fats, trans-fats and eat more fiber Reviewed to take statin as ordered Reviewed to lower fatty foods, red meat, cheese, milk and increase fiber like whole grains and veggies. Patient Goals/Self-Care Activities: -- change to whole grain breads, cereal, pasta - eat smaller or less servings of red meat - fill half the plate with nonstarchy vegetables - get blood test (fasting) done 1 week before next visit - increase the amount of fiber in food - read food labels for fat and fiber - s- check blood pressure weekly - choose a place to take my blood pressure  (home, clinic or office, retail store) - write blood pressure results in a log or diarywitch to low-fat or skim milk  Follow Up Plan: Telephone follow up appointment with care management team member scheduled for: 07/18/21 at 9 AM The patient has been provided with contact information for the care management team and has been advised to call with any health related questions or concerns.      Patient Care Plan: COPD (Adult)     Problem Identified: Symptom Exacerbation (COPD)   Priority: High     Long-Range Goal: Symptom Exacerbation Prevented or Minimized   Start Date: 06/20/2021  Expected End Date: 11/21/2021  This Visit's Progress: On track  Priority: High  Note:   Current Barriers:  Knowledge deficits related to basic understanding of COPD disease process Knowledge deficits related to basic COPD self care/management Knowledge deficit related to basic understanding of how to use inhalers and how inhaled medications work Unable to independently self manage COPD, hx of recent Pulmonary embolus   States he is breathing better now and only using his oxygen if needed.  States his O2 sat is usually 94-96%.  States he still gets short of breath with exertion but it is improving.  States he is using his inhalers as directed.  Denies any cough  Case Manager Clinical Goal(s): patient will report using inhalers as prescribed including rinsing mouth after use patient will verbalize understanding of COPD action plan and when to seek appropriate levels of medical care patient will engage in lite exercise as tolerated to build/regain stamina and strength and reduce shortness of breath through activity tolerance patient will verbalize basic understanding of COPD disease process and self care activities patient will not be hospitalized for COPD exacerbation as evidenced  Interventions:  Collaboration with [name], MD regarding development and update of comprehensive plan of care as  evidenced by provider attestation and co-signature Inter-disciplinary care team collaboration (see longitudinal plan of care) Provided patient with basic written and verbal COPD education on self care/management/and exacerbation prevention  Provided patient with COPD action plan and reinforced importance  of daily self assessment Provided instruction about proper use of medications used for management of COPD including inhalers Advised patient to self assesses COPD action plan zone and make appointment with provider if in the yellow zone for 48 hours without improvement. Provided education about and advised patient to utilize infection prevention strategies to reduce risk of respiratory infection  Reviewed s/sx of Pulmonary embolus to call 911 Reviewed importance of taking anticoagulant until directed to stop Reviewed importance of fall prevention and safety while on anticoagulant  Self-Care Activities:  Patient verbalizes understanding of plan to self manage COPD Self administers medications as prescribed Attends all scheduled provider appointments Calls pharmacy for medication refills Calls provider office for new concerns or questions Patient Goals: - don't eat or exercise right before bedtime - eat healthy - get at least 7 to 8 hours of sleep at night - get outdoors every day (weather permitting) - keep room cool and dark - use a fan or white noise in bedroom - use devices that will help like a cane, sock-puller or reacher - develop a rescue plan - eliminate symptom triggers at home - follow rescue plan if symptoms flare-up - keep follow-up appointments - use an extra pillow to sleep - identify and remove indoor air pollutants - limit outdoor activity during cold weather - listen for public air quality announcements every day Follow Up Plan: Telephone follow up appointment with care management team member scheduled for: 07/18/21 at 9 AM The patient has been provided with contact  information for the care management team and has been advised to call with any health related questions or concerns.

## 2021-06-20 NOTE — Chronic Care Management (AMB) (Signed)
Chronic Care Management   CCM RN Visit Note  06/20/2021 Name: Allen [name] MRN: [id number] DOB: [birth year]  Subjective: Allen [name] is a 75 y.o. year old male who is a primary care patient of Allen [name], Allen Millet, MD. The care management team was consulted for assistance with disease management and care coordination needs.    Engaged with patient by telephone for initial visit in response to provider referral for case management and/or care coordination services.   Consent to Services:  The patient was given the following information about Chronic Care Management services today, agreed to services, and gave verbal consent: 1. CCM service includes personalized support from designated clinical staff supervised by the primary care provider, including individualized plan of care and coordination with other care providers 2. 24/7 contact phone numbers for assistance for urgent and routine care needs. 3. Service will only be billed when office clinical staff spend 20 minutes or more in a month to coordinate care. 4. Only one practitioner may furnish and bill the service in a calendar month. 5.The patient may stop CCM services at any time (effective at the end of the month) by phone call to the office staff. 6. The patient will be responsible for cost sharing (co-pay) of up to 20% of the service fee (after annual deductible is met). Patient agreed to services and consent obtained.  Patient agreed to services and verbal consent obtained.   Assessment: Review of patient past medical history, allergies, medications, health status, including review of consultants reports, laboratory and other test data, was performed as part of comprehensive evaluation and provision of chronic care management services.   SDOH (Social Determinants of Health) assessments and interventions performed:    CCM Care Plan  Allergies  Allergen Reactions   Sulfa Antibiotics Hives   Advil [Ibuprofen] Swelling and Other  (See Comments)    Lip swelling   Atorvastatin     Muscle pain   Penicillins Hives and Other (See Comments)    Has patient had a PCN reaction causing immediate rash, facial/tongue/throat swelling, SOB or lightheadedness with hypotension: no Has patient had a PCN reaction causing severe rash involving mucus membranes or skin necrosis: no Has patient had a PCN reaction that required hospitalization no - childhood reaction Has patient had a PCN reaction occurring within the last 10 years: no - childhood reaction If all of the above answers are "NO", then may proceed with Cephalosporin use.    Sulfonamide Derivatives Hives    Outpatient Encounter Medications as of 06/20/2021  Medication Sig Note   albuterol (VENTOLIN HFA) 108 (90 Base) MCG/ACT inhaler TAKE 2 PUFFS BY MOUTH EVERY 6 HOURS AS NEEDED FOR WHEEZE OR SHORTNESS OF BREATH    allopurinol (ZYLOPRIM) 100 MG tablet Take 1 tablet (100 mg total) by mouth daily.    apixaban (ELIQUIS) 5 MG TABS tablet Take 1 tablet (5 mg total) by mouth 2 (two) times daily.    aspirin EC 81 MG EC tablet Take 1 tablet (81 mg total) by mouth daily. 05/24/2021: Med on hold while pt is taking colchicine   budesonide-formoterol (SYMBICORT) 80-4.5 MCG/ACT inhaler TAKE 2 PUFFS BY MOUTH TWICE A DAY    buPROPion (WELLBUTRIN XL) 150 MG 24 hr tablet TAKE 1 TABLET BY MOUTH EVERY DAY    clonazePAM (KLONOPIN) 0.5 MG tablet TAKE 1 TABLET BY MOUTH TWICE A DAY AS NEEDED FOR ANXIETY    colchicine 0.6 MG tablet Take 1 tablet (0.6 mg total) by mouth daily. Until flare resolves.  Do not take rosuvastatin or aspirin when taking this medication.    furosemide (LASIX) 20 MG tablet Take 1 tablet (20 mg total) by mouth daily.    metoprolol tartrate (LOPRESSOR) 25 MG tablet TAKE 0.5 TABLETS (12.5 MG TOTAL) BY MOUTH 2 (TWO) TIMES DAILY.    nitroGLYCERIN (NITROSTAT) 0.4 MG SL tablet Place 1 tablet (0.4 mg total) under the tongue every 5 (five) minutes as needed for chest pain.     olmesartan-hydrochlorothiazide (BENICAR HCT) 20-12.5 MG tablet Take 1 tablet by mouth daily.    rosuvastatin (CRESTOR) 20 MG tablet TAKE 1 TABLET BY MOUTH EVERY DAY 05/24/2021: Med is on hold while pt is taking colchicine   No facility-administered encounter medications on file as of 06/20/2021.    Patient Active Problem List   Diagnosis Date Noted   Chronic respiratory failure with hypoxia ([place]) 06/01/2021   Pulmonary embolus ([place]) 05/24/2021   Pulmonary infarct ([place]) 05/24/2021   Leukocytosis 05/24/2021   Status post coronary artery stent placement    History of non-ST elevation myocardial infarction (NSTEMI) 12/27/2019   Lower extremity edema 03/26/2019   Nocturia more than twice per night 11/26/2018   Subclavian artery stenosis, left ([place]) 01/12/2018   Morbid obesity ([place]) 02/19/2016   Left shoulder pain 02/19/2016   Anxiety state 02/19/2016   Chronic neck pain 09/17/2015   Gastric mass - submucsaol - antrum 03/03/2015   COPD (chronic obstructive pulmonary disease) with emphysema ([place]) 02/07/2015   Rotator cuff impingement syndrome of left shoulder 08/02/2014   De Quervain's tenosynovitis, right 05/01/2014   Thyromegaly 05/01/2014   Knee sprain and strain 02/17/2013   Health maintenance examination 08/08/2011   Chest pain 08/08/2011   Prostate cancer screening 08/08/2011   Anxiety and depression 12/27/2010   Unspecified hearing loss 04/19/2010   Dyslipidemia, goal LDL below 70 08/21/2007   Essential hypertension 08/21/2007   CAD S/P PCI 08/21/2007   GERD 08/21/2007    Conditions to be addressed/monitored:CAD, HTN, HLD, COPD, and Pulmonary Embolus  Care Plan : Cardiovascular disease (CAD, Pulmonary embolus, HTN and HLD)  Updates made by Dimitri Ped, RN since 06/20/2021 12:00 AM     Problem: Need for Cardiovascular disease (CAD, Pulmonary embolus, HTN and HLD)self managment   Priority: High     Long-Range Goal: Effective Cardiovascular disease (CAD,Pulmonary  embolus, HTN and HLD) self management   Start Date: 06/20/2021  Expected End Date: 11/21/2021  This Visit's Progress: On track  Priority: High  Note:   Current Barriers:  Knowledge deficits related to self health management of Cardiovascular disease (CAD, Pulmonary embolus, HTN and HLD) Knowledge Deficits related to self management of Cardiovascular disease (CAD, Pulmonary embolus, HTN and HLD) Chronic Disease Management support and education needs related to self management of Cardiovascular disease (CAD, Pulmonary embolus, HTN and HLD) Unable to independently self manage Cardiovascular disease (CAD, Pulmonary embolus, HTN and HLD) States that he is getting stronger since coming home from the hospital.  Denies any chest pains or swelling.  States his daughter checks his B/P and it has been good.  States that he is now taking his medications as ordered and using a medication box Nurse Case Manager Clinical Goal(s):  patient will take all medications exactly as prescribed and will call provider for medication related questions patient will verbalize understanding of cardiovascular symptoms and when to call doctor patient will verbalize understanding of plan for self management of Cardiovascular disease (CAD, Pulmonary embolus, HTN and HLD) patient will attend all scheduled medical appointments: pulmonary  07/02/21, cardiology 08/16/21 patient will demonstrate improved adherence to prescribed treatment plan for self management of Cardiovascular disease (CAD, Pulmonary embolus, HTN and HLD) patient will verbalize basic understanding of  Cardiovascular disease (CAD, Pulmonary embolus, HTN and HLD) disease process and self health management plan patient will demonstrate understanding of rationale for each prescribed medication the patient will demonstrate ongoing self health care management ability Interventions:  Collaboration with [name], MD regarding development and update of  comprehensive plan of care as evidenced by provider attestation and co-signature Inter-disciplinary care team collaboration (see longitudinal plan of care) Basic overview and discussion of CAD Medications reviewed Evaluation of current treatment plan related to self management of Cardiovascular disease (CAD, Pulmonary embolus, HTN and HLD) and patient's adherence to plan as established by provider. Provided education to patient re: self management of Cardiovascular disease (CAD, Pulmonary embolus, HTN and HLD) Advised patient, providing education and rationale, to monitor blood pressure daily and record, calling provider for findings outside established parameters.  Provided patient with written educational materials related to self management of Cardiovascular disease (CAD, Pulmonary embolus, HTN and HLD) Reviewed scheduled/upcoming provider appointments including:  Discussed plans with patient for ongoing care management follow up and provided patient with direct contact information for care management team Reviewed to avoid saturated fats, trans-fats and eat more fiber Reviewed to take statin as ordered Reviewed to lower fatty foods, red meat, cheese, milk and increase fiber like whole grains and veggies. Patient Goals/Self-Care Activities: -- change to whole grain breads, cereal, pasta - eat smaller or less servings of red meat - fill half the plate with nonstarchy vegetables - get blood test (fasting) done 1 week before next visit - increase the amount of fiber in food - read food labels for fat and fiber - s- check blood pressure weekly - choose a place to take my blood pressure (home, clinic or office, retail store) - write blood pressure results in a log or diarywitch to low-fat or skim milk  Follow Up Plan: Telephone follow up appointment with care management team member scheduled for: 07/18/21 at 9 AM The patient has been provided with contact information for the care management  team and has been advised to call with any health related questions or concerns.      Care Plan : COPD (Adult)  Updates made by Dimitri Ped, RN since 06/20/2021 12:00 AM     Problem: Symptom Exacerbation (COPD)   Priority: High     Long-Range Goal: Symptom Exacerbation Prevented or Minimized   Start Date: 06/20/2021  Expected End Date: 11/21/2021  This Visit's Progress: On track  Priority: High  Note:   Current Barriers:  Knowledge deficits related to basic understanding of COPD disease process Knowledge deficits related to basic COPD self care/management Knowledge deficit related to basic understanding of how to use inhalers and how inhaled medications work Unable to independently self manage COPD, hx of recent Pulmonary embolus   States he is breathing better now and only using his oxygen if needed.  States his O2 sat is usually 94-96%.  States he still gets short of breath with exertion but it is improving.  States he is using his inhalers as directed.  Denies any cough  Case Manager Clinical Goal(s): patient will report using inhalers as prescribed including rinsing mouth after use patient will verbalize understanding of COPD action plan and when to seek appropriate levels of medical care patient will engage in lite exercise as tolerated to build/regain stamina and  strength and reduce shortness of breath through activity tolerance patient will verbalize basic understanding of COPD disease process and self care activities patient will not be hospitalized for COPD exacerbation as evidenced  Interventions:  Collaboration with [name], MD regarding development and update of comprehensive plan of care as evidenced by provider attestation and co-signature Inter-disciplinary care team collaboration (see longitudinal plan of care) Provided patient with basic written and verbal COPD education on self care/management/and exacerbation prevention  Provided patient with COPD  action plan and reinforced importance of daily self assessment Provided instruction about proper use of medications used for management of COPD including inhalers Advised patient to self assesses COPD action plan zone and make appointment with provider if in the yellow zone for 48 hours without improvement. Provided education about and advised patient to utilize infection prevention strategies to reduce risk of respiratory infection  Reviewed s/sx of Pulmonary embolus to call 911 Reviewed importance of taking anticoagulant until directed to stop Reviewed importance of fall prevention and safety while on anticoagulant  Self-Care Activities:  Patient verbalizes understanding of plan to self manage COPD Self administers medications as prescribed Attends all scheduled provider appointments Calls pharmacy for medication refills Calls provider office for new concerns or questions Patient Goals: - don't eat or exercise right before bedtime - eat healthy - get at least 7 to 8 hours of sleep at night - get outdoors every day (weather permitting) - keep room cool and dark - use a fan or white noise in bedroom - use devices that will help like a cane, sock-puller or reacher - develop a rescue plan - eliminate symptom triggers at home - follow rescue plan if symptoms flare-up - keep follow-up appointments - use an extra pillow to sleep - identify and remove indoor air pollutants - limit outdoor activity during cold weather - listen for public air quality announcements every day Follow Up Plan: Telephone follow up appointment with care management team member scheduled for: 07/18/21 at 9 AM The patient has been provided with contact information for the care management team and has been advised to call with any health related questions or concerns.       Plan:Telephone follow up appointment with care management team member scheduled for:  07/18/21 and The patient has been provided with contact  information for the care management team and has been advised to call with any health related questions or concerns.  Peter Garter RN, BSN,CCM, CDE Care Management Coordinator [place] [phone], Mobile [phone]

## 2021-06-21 ENCOUNTER — Ambulatory Visit (INDEPENDENT_AMBULATORY_CARE_PROVIDER_SITE_OTHER): Payer: Medicare HMO | Admitting: Family Medicine

## 2021-06-25 DIAGNOSIS — J449 Chronic obstructive pulmonary disease, unspecified: Secondary | ICD-10-CM | POA: Diagnosis not present

## 2021-06-26 DIAGNOSIS — J449 Chronic obstructive pulmonary disease, unspecified: Secondary | ICD-10-CM | POA: Diagnosis not present

## 2021-06-29 DIAGNOSIS — M479 Spondylosis, unspecified: Secondary | ICD-10-CM | POA: Insufficient documentation

## 2021-06-29 DIAGNOSIS — Z136 Encounter for screening for cardiovascular disorders: Secondary | ICD-10-CM | POA: Diagnosis not present

## 2021-06-29 DIAGNOSIS — M10072 Idiopathic gout, left ankle and foot: Secondary | ICD-10-CM | POA: Diagnosis not present

## 2021-06-29 DIAGNOSIS — E785 Hyperlipidemia, unspecified: Secondary | ICD-10-CM | POA: Insufficient documentation

## 2021-06-29 DIAGNOSIS — Z86711 Personal history of pulmonary embolism: Secondary | ICD-10-CM | POA: Insufficient documentation

## 2021-06-29 DIAGNOSIS — I10 Essential (primary) hypertension: Secondary | ICD-10-CM | POA: Diagnosis not present

## 2021-06-29 DIAGNOSIS — E049 Nontoxic goiter, unspecified: Secondary | ICD-10-CM | POA: Diagnosis not present

## 2021-06-29 DIAGNOSIS — M109 Gout, unspecified: Secondary | ICD-10-CM | POA: Insufficient documentation

## 2021-06-29 DIAGNOSIS — H6123 Impacted cerumen, bilateral: Secondary | ICD-10-CM | POA: Diagnosis not present

## 2021-06-29 DIAGNOSIS — Z0001 Encounter for general adult medical examination with abnormal findings: Secondary | ICD-10-CM | POA: Diagnosis not present

## 2021-07-02 ENCOUNTER — Ambulatory Visit: Payer: Medicare Other | Admitting: Adult Health

## 2021-07-02 ENCOUNTER — Other Ambulatory Visit: Payer: Self-pay

## 2021-07-02 ENCOUNTER — Encounter: Payer: Self-pay | Admitting: Adult Health

## 2021-07-02 VITALS — BP 120/70 | HR 74 | Temp 98.3°F | Ht 71.0 in | Wt 277.0 lb

## 2021-07-02 DIAGNOSIS — J9611 Chronic respiratory failure with hypoxia: Secondary | ICD-10-CM

## 2021-07-02 DIAGNOSIS — Z23 Encounter for immunization: Secondary | ICD-10-CM

## 2021-07-02 DIAGNOSIS — I2694 Multiple subsegmental pulmonary emboli without acute cor pulmonale: Secondary | ICD-10-CM | POA: Diagnosis not present

## 2021-07-02 DIAGNOSIS — R0683 Snoring: Secondary | ICD-10-CM | POA: Diagnosis not present

## 2021-07-02 DIAGNOSIS — J438 Other emphysema: Secondary | ICD-10-CM

## 2021-07-02 DIAGNOSIS — R400 Somnolence: Secondary | ICD-10-CM

## 2021-07-02 NOTE — Patient Instructions (Addendum)
Symbicort 2 puffs Twice daily, rinse after use.  Continue on Eliquis '5mg'$  Twice daily  Saline nasal gel Twice daily.  Set up for Split night sleep study .  Healthy sleep regimen .  Follow up with Cardiology as planned next month .   Flu shot today .  Follow up with 3 months with Dr. Ander Slade or Ludwig Tugwell NP and As needed   Please contact office for sooner follow up if symptoms do not improve or worsen or seek emergency care
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
## 2021-07-05 ENCOUNTER — Ambulatory Visit (INDEPENDENT_AMBULATORY_CARE_PROVIDER_SITE_OTHER): Payer: Medicare HMO | Admitting: Family Medicine

## 2021-07-05 DIAGNOSIS — R400 Somnolence: Secondary | ICD-10-CM | POA: Insufficient documentation

## 2021-07-05 NOTE — Assessment & Plan Note (Signed)
Provoked PE August 2022.  Patient is continue on Eliquis.  Consider a 32-monththerapy We will check hypercoagulable panel if Eliquis is DC'd after 6 months.  Plan  . Patient Instructions  Symbicort 2 puffs Twice daily, rinse after use.  Continue on Eliquis '5mg'$  Twice daily  Saline nasal gel Twice daily.  Set up for Split night sleep study .  Healthy sleep regimen .  Follow up with Cardiology as planned next month .   Flu shot today .  Follow up with 3 months with Dr. OAnder Sladeor Brithney Bensen NP and As needed   Please contact office for sooner follow up if symptoms do not improve or worsen or seek emergency care

## 2021-07-05 NOTE — Assessment & Plan Note (Signed)
Continue with oxygen as needed to maintain O2 saturation greater than 88 to 90%.

## 2021-07-05 NOTE — Assessment & Plan Note (Signed)
Healthy weight loss discussed 

## 2021-07-05 NOTE — Assessment & Plan Note (Signed)
Symptoms suspicious for sleep apnea with daytime sleepiness, snoring, witnessed apneic events and obesity.  Set up for split-night sleep study  Plan  Patient Instructions  Symbicort 2 puffs Twice daily, rinse after use.  Continue on Eliquis '5mg'$  Twice daily  Saline nasal gel Twice daily.  Set up for Split night sleep study .  Healthy sleep regimen .  Follow up with Cardiology as planned next month .   Flu shot today .  Follow up with 3 months with Dr. Ander Slade or Rondel Episcopo NP and As needed   Please contact office for sooner follow up if symptoms do not improve or worsen or seek emergency care

## 2021-07-05 NOTE — Assessment & Plan Note (Signed)
Compensated continue on current regimen  Plan  Patient Instructions  Symbicort 2 puffs Twice daily, rinse after use.  Continue on Eliquis '5mg'$  Twice daily  Saline nasal gel Twice daily.  Set up for Split night sleep study .  Healthy sleep regimen .  Follow up with Cardiology as planned next month .   Flu shot today .  Follow up with 3 months with Dr. Ander Slade or Filomeno Cromley NP and As needed   Please contact office for sooner follow up if symptoms do not improve or worsen or seek emergency care

## 2021-07-18 ENCOUNTER — Telehealth: Payer: Medicare Other

## 2021-07-26 DIAGNOSIS — J449 Chronic obstructive pulmonary disease, unspecified: Secondary | ICD-10-CM | POA: Diagnosis not present

## 2021-08-01 ENCOUNTER — Ambulatory Visit (INDEPENDENT_AMBULATORY_CARE_PROVIDER_SITE_OTHER): Payer: Medicare Other

## 2021-08-01 DIAGNOSIS — I2510 Atherosclerotic heart disease of native coronary artery without angina pectoris: Secondary | ICD-10-CM

## 2021-08-01 DIAGNOSIS — I10 Essential (primary) hypertension: Secondary | ICD-10-CM

## 2021-08-01 DIAGNOSIS — E785 Hyperlipidemia, unspecified: Secondary | ICD-10-CM

## 2021-08-01 DIAGNOSIS — Z9861 Coronary angioplasty status: Secondary | ICD-10-CM

## 2021-08-01 NOTE — Chronic Care Management (AMB) (Signed)
Chronic Care Management   CCM RN Visit Note  08/01/2021 Name: Allen [name] MRN: [id number] DOB: [birth year]  Subjective: Allen [name] is a 75 y.o. year old male who is a primary care patient of Birdie Riddle, Aundra Millet, MD. The care management team was consulted for assistance with disease management and care coordination needs.    Engaged with patient by telephone for follow up visit in response to provider referral for case management and/or care coordination services.   Consent to Services:  The patient was given information about Chronic Care Management services, agreed to services, and gave verbal consent prior to initiation of services.  Please see initial visit note for detailed documentation.   Patient agreed to services and verbal consent obtained.   Assessment: Review of patient past medical history, allergies, medications, health status, including review of consultants reports, laboratory and other test data, was performed as part of comprehensive evaluation and provision of chronic care management services.   SDOH (Social Determinants of Health) assessments and interventions performed:    CCM Care Plan  Allergies  Allergen Reactions   Sulfa Antibiotics Hives   Advil [Ibuprofen] Swelling and Other (See Comments)    Lip swelling   Atorvastatin     Muscle pain   Penicillins Hives and Other (See Comments)    Has patient had a PCN reaction causing immediate rash, facial/tongue/throat swelling, SOB or lightheadedness with hypotension: no Has patient had a PCN reaction causing severe rash involving mucus membranes or skin necrosis: no Has patient had a PCN reaction that required hospitalization no - childhood reaction Has patient had a PCN reaction occurring within the last 10 years: no - childhood reaction If all of the above answers are "NO", then may proceed with Cephalosporin use.    Sulfonamide Derivatives Hives    Outpatient Encounter Medications as of 08/01/2021   Medication Sig Note   albuterol (VENTOLIN HFA) 108 (90 Base) MCG/ACT inhaler TAKE 2 PUFFS BY MOUTH EVERY 6 HOURS AS NEEDED FOR WHEEZE OR SHORTNESS OF BREATH    allopurinol (ZYLOPRIM) 100 MG tablet Take 1 tablet (100 mg total) by mouth daily.    apixaban (ELIQUIS) 5 MG TABS tablet Take 1 tablet (5 mg total) by mouth 2 (two) times daily.    aspirin EC 81 MG EC tablet Take 1 tablet (81 mg total) by mouth daily. 05/24/2021: Med on hold while pt is taking colchicine   budesonide-formoterol (SYMBICORT) 80-4.5 MCG/ACT inhaler TAKE 2 PUFFS BY MOUTH TWICE A DAY    buPROPion (WELLBUTRIN XL) 150 MG 24 hr tablet TAKE 1 TABLET BY MOUTH EVERY DAY    clonazePAM (KLONOPIN) 0.5 MG tablet TAKE 1 TABLET BY MOUTH TWICE A DAY AS NEEDED FOR ANXIETY    colchicine 0.6 MG tablet Take 1 tablet (0.6 mg total) by mouth daily. Until flare resolves. Do not take rosuvastatin or aspirin when taking this medication.    furosemide (LASIX) 20 MG tablet Take 1 tablet (20 mg total) by mouth daily.    metoprolol tartrate (LOPRESSOR) 25 MG tablet TAKE 0.5 TABLETS (12.5 MG TOTAL) BY MOUTH 2 (TWO) TIMES DAILY.    nitroGLYCERIN (NITROSTAT) 0.4 MG SL tablet Place 1 tablet (0.4 mg total) under the tongue every 5 (five) minutes as needed for chest pain.    olmesartan-hydrochlorothiazide (BENICAR HCT) 20-12.5 MG tablet Take 1 tablet by mouth daily.    rosuvastatin (CRESTOR) 20 MG tablet TAKE 1 TABLET BY MOUTH EVERY DAY 05/24/2021: Med is on hold while pt is taking colchicine  No facility-administered encounter medications on file as of 08/01/2021.    Patient Active Problem List   Diagnosis Date Noted   Daytime sleepiness 07/05/2021   Chronic respiratory failure with hypoxia ([place]) 06/01/2021   Pulmonary embolus ([place]) 05/24/2021   Pulmonary infarct ([place]) 05/24/2021   Leukocytosis 05/24/2021   Status post coronary artery stent placement    History of non-ST elevation myocardial infarction (NSTEMI) 12/27/2019   Lower extremity edema  03/26/2019   Nocturia more than twice per night 11/26/2018   Subclavian artery stenosis, left ([place]) 01/12/2018   Morbid obesity ([place]) 02/19/2016   Left shoulder pain 02/19/2016   Anxiety state 02/19/2016   Chronic neck pain 09/17/2015   Gastric mass - submucsaol - antrum 03/03/2015   COPD (chronic obstructive pulmonary disease) with emphysema ([place]) 02/07/2015   Rotator cuff impingement syndrome of left shoulder 08/02/2014   De Quervain's tenosynovitis, right 05/01/2014   Thyromegaly 05/01/2014   Knee sprain and strain 02/17/2013   Health maintenance examination 08/08/2011   Chest pain 08/08/2011   Prostate cancer screening 08/08/2011   Anxiety and depression 12/27/2010   Unspecified hearing loss 04/19/2010   Dyslipidemia, goal LDL below 70 08/21/2007   Essential hypertension 08/21/2007   CAD S/P PCI 08/21/2007   GERD 08/21/2007    Conditions to be addressed/monitored:CAD, HTN, HLD, and COPD  Care Plan : Cardiovascular disease (CAD, Pulmonary embolus, HTN and HLD)  Updates made by Dimitri Ped, RN since 08/01/2021 12:00 AM  Completed 08/01/2021   Problem: Need for Cardiovascular disease (CAD, Pulmonary embolus, HTN and HLD)self managment Resolved 08/01/2021  Priority: High     Long-Range Goal: Effective Cardiovascular disease (CAD,Pulmonary embolus, HTN and HLD) self management Completed 08/01/2021  Start Date: 06/20/2021  Expected End Date: 11/21/2021  This Visit's Progress: On track  Recent Progress: On track  Priority: High  Note:   Current Barriers:  Knowledge deficits related to self health management of Cardiovascular disease (CAD, Pulmonary embolus, HTN and HLD) Knowledge Deficits related to self management of Cardiovascular disease (CAD, Pulmonary embolus, HTN and HLD) Chronic Disease Management support and education needs related to self management of Cardiovascular disease (CAD, Pulmonary embolus, HTN and HLD) Unable to independently self manage Cardiovascular  disease (CAD, Pulmonary embolus, HTN and HLD) States that he is in the process of changing his primary care doctor at [facility] group Nurse Case Manager Clinical Goal(s):  patient will take all medications exactly as prescribed and will call provider for medication related questions patient will verbalize understanding of cardiovascular symptoms and when to call doctor patient will verbalize understanding of plan for self management of Cardiovascular disease (CAD, Pulmonary embolus, HTN and HLD) patient will attend all scheduled medical appointments: pulmonary 07/02/21, cardiology 08/16/21 patient will demonstrate improved adherence to prescribed treatment plan for self management of Cardiovascular disease (CAD, Pulmonary embolus, HTN and HLD) patient will verbalize basic understanding of  Cardiovascular disease (CAD, Pulmonary embolus, HTN and HLD) disease process and self health management plan patient will demonstrate understanding of rationale for each prescribed medication the patient will demonstrate ongoing self health care management ability Interventions:  Collaboration with [name], MD regarding development and update of comprehensive plan of care as evidenced by provider attestation and co-signature Inter-disciplinary care team collaboration (see longitudinal plan of care) Basic overview and discussion of CAD Medications reviewed Evaluation of current treatment plan related to self management of Cardiovascular disease (CAD, Pulmonary embolus, HTN and HLD) and patient's adherence to plan as established by provider. Provided education  to patient re: self management of Cardiovascular disease (CAD, Pulmonary embolus, HTN and HLD) Advised patient, providing education and rationale, to monitor blood pressure daily and record, calling provider for findings outside established parameters.  Provided patient with written educational materials related to self management of  Cardiovascular disease (CAD, Pulmonary embolus, HTN and HLD) Reviewed scheduled/upcoming provider appointments including:  Discussed plans with patient for ongoing care management follow up and provided patient with direct contact information for care management team Reviewed to avoid saturated fats, trans-fats and eat more fiber Reviewed to take statin as ordered Reviewed to lower fatty foods, red meat, cheese, milk and increase fiber like whole grains and veggies. Patient Goals/Self-Care Activities: -- change to whole grain breads, cereal, pasta - eat smaller or less servings of red meat - fill half the plate with nonstarchy vegetables - get blood test (fasting) done 1 week before next visit - increase the amount of fiber in food - read food labels for fat and fiber - s- check blood pressure weekly - choose a place to take my blood pressure (home, clinic or office, retail store) - write blood pressure results in a log or diarywitch to low-fat or skim milk  Follow Up Plan: No further follow up. Case closed as he will be no longer eligible for CCM services    Care Plan : COPD (Adult)  Updates made by Dimitri Ped, RN since 08/01/2021 12:00 AM  Completed 08/01/2021   Problem: Symptom Exacerbation (COPD) Resolved 08/01/2021  Priority: High     Long-Range Goal: Symptom Exacerbation Prevented or Minimized Completed 08/01/2021  Start Date: 06/20/2021  Expected End Date: 11/21/2021  This Visit's Progress: On track  Recent Progress: On track  Priority: High  Note:   Current Barriers:  Knowledge deficits related to basic understanding of COPD disease process Knowledge deficits related to basic COPD self care/management Knowledge deficit related to basic understanding of how to use inhalers and how inhaled medications work Unable to independently self manage COPD, hx of recent Pulmonary embolus   States he in the process of changing his primary care provider to [place]. Case Manager Clinical Goal(s): patient will report using inhalers as prescribed including rinsing mouth after use patient will verbalize understanding of COPD action plan and when to seek appropriate levels of medical care patient will engage in lite exercise as tolerated to build/regain stamina and strength and reduce shortness of breath through activity tolerance patient will verbalize basic understanding of COPD disease process and self care activities patient will not be hospitalized for COPD exacerbation as evidenced  Interventions:  Collaboration with [name], MD regarding development and update of comprehensive plan of care as evidenced by provider attestation and co-signature Inter-disciplinary care team collaboration (see longitudinal plan of care) Provided patient with basic written and verbal COPD education on self care/management/and exacerbation prevention  Provided patient with COPD action plan and reinforced importance of daily self assessment Provided instruction about proper use of medications used for management of COPD including inhalers Advised patient to self assesses COPD action plan zone and make appointment with provider if in the yellow zone for 48 hours without improvement. Provided education about and advised patient to utilize infection prevention strategies to reduce risk of respiratory infection  Reviewed s/sx of Pulmonary embolus to call 911 Reviewed importance of taking anticoagulant until directed to stop Reviewed importance of fall prevention and safety while on anticoagulant  Self-Care Activities:  Patient verbalizes understanding of plan to self manage  COPD Self administers medications as prescribed Attends all scheduled provider appointments Calls pharmacy for medication refills Calls provider office for new concerns or questions Patient Goals: - don't eat or exercise right before bedtime - eat healthy - get at least 7 to 8 hours of  sleep at night - get outdoors every day (weather permitting) - keep room cool and dark - use a fan or white noise in bedroom - use devices that will help like a cane, sock-puller or reacher - develop a rescue plan - eliminate symptom triggers at home - follow rescue plan if symptoms flare-up - keep follow-up appointments - use an extra pillow to sleep - identify and remove indoor air pollutants - limit outdoor activity during cold weather - listen for public air quality announcements every day Follow Up Plan: No further follow up. Case closed as he will be no longer eligible for CCM services     Plan:No further follow up. Case closed as he will be no longer eligible for CCM services Peter Garter RN, [facility], CDE Care Management Coordinator [place] [phone], Mobile [phone]

## 2021-08-01 NOTE — Patient Instructions (Signed)
Visit Information  PATIENT GOALS:  Goals Addressed             This Visit's Progress    COMPLETED: Manage My Cholesterol       Timeframe:  Long-Range Goal Priority:  Medium Start Date:      06/20/21                       Expected End Date:      11/21/21                 Follow Up Date 07/18/21    - change to whole grain breads, cereal, pasta - eat smaller or less servings of red meat - fill half the plate with nonstarchy vegetables - get blood test (fasting) done 1 week before next visit - increase the amount of fiber in food - read food labels for fat and fiber - switch to low-fat or skim milk    Why is this important?   Changing cholesterol starts with eating heart-healthy foods.  Other steps may be to increase your activity and to quit if you smoke.    Notes:      COMPLETED: Track and Manage My Blood Pressure-Hypertension       Timeframe:  Long-Range Goal Priority:  Medium Start Date:    8/3//22                         Expected End Date:    11/21/21                   Follow Up Date 07/18/21    - check blood pressure weekly - choose a place to take my blood pressure (home, clinic or office, retail store) - write blood pressure results in a log or diary    Why is this important?   You won't feel high blood pressure, but it can still hurt your blood vessels.  High blood pressure can cause heart or kidney problems. It can also cause a stroke.  Making lifestyle changes like losing a little weight or eating less salt will help.  Checking your blood pressure at home and at different times of the day can help to control blood pressure.  If the doctor prescribes medicine remember to take it the way the doctor ordered.  Call the office if you cannot afford the medicine or if there are questions about it.     Notes:      COMPLETED: Track and Manage My Symptoms-COPD       Timeframe:  Long-Range Goal Priority:  High Start Date:     06/20/21                        Expected End  Date: 11/21/21                      Follow Up Date 07/18/21    - develop a rescue plan - eliminate symptom triggers at home - follow rescue plan if symptoms flare-up - keep follow-up appointments - use an extra pillow to sleep    Why is this important?   Tracking your symptoms and other information about your health helps your doctor plan your care.  Write down the symptoms, the time of day, what you were doing and what medicine you are taking.  You will soon learn how to manage your symptoms.  Notes:         Patient verbalizes understanding of instructions provided today and agrees to view in [place].   No further follow up required: case closed Peter Garter RN, [facility], CDE Care Management Coordinator [place] [phone], Mobile [phone]

## 2021-08-02 ENCOUNTER — Other Ambulatory Visit: Payer: Self-pay

## 2021-08-02 DIAGNOSIS — J438 Other emphysema: Secondary | ICD-10-CM

## 2021-08-02 MED ORDER — FUROSEMIDE 20 MG PO TABS: 20.0000 mg | ORAL_TABLET | Freq: Every day | ORAL | 3 refills | Status: DC

## 2021-08-02 MED ORDER — BUDESONIDE-FORMOTEROL FUMARATE 80-4.5 MCG/ACT IN AERO
INHALATION_SPRAY | RESPIRATORY_TRACT | 1 refills | Status: DC
Start: 2021-08-02 — End: 2022-04-24
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
## 2021-08-09 ENCOUNTER — Other Ambulatory Visit: Payer: Self-pay

## 2021-08-09 MED ORDER — ALBUTEROL SULFATE HFA 108 (90 BASE) MCG/ACT IN AERS: INHALATION_SPRAY | RESPIRATORY_TRACT | 2 refills | Status: DC

## 2021-08-13 ENCOUNTER — Telehealth: Payer: Self-pay | Admitting: Cardiovascular Disease

## 2021-08-13 NOTE — Telephone Encounter (Signed)
Patient called in to reschedule appt that was scheduled for 10/27, he stated he was going out of town. He has cancelled his last 3 appointments, none of which were because of [place]. I rescheduled for 11/18. He wants something sooner but I did not have anything to offer. Appt notes state, "chest discomfort, pain down left arm and SOB."  Not sure if he needs to added on to the DOD day but he has been rescheduling his appointments since July.

## 2021-08-13 NOTE — Telephone Encounter (Signed)
Spoke with patient of Dr. Gwenlyn Found who has chest pain, pain down left arm, shortness of breath Patient has cardiac history - NSTEMI, stent  He has taken NTG once or twice  He has been on the scheduled since July for the same symptoms, cancelled 3 appointments  Scheduled for DOD appt on 11/1 with Dr. Gwenlyn Found and advised patient the necessity of keeping this visit.

## 2021-08-16 ENCOUNTER — Ambulatory Visit: Payer: Medicare Other | Admitting: Student

## 2021-08-20 DIAGNOSIS — I10 Essential (primary) hypertension: Secondary | ICD-10-CM

## 2021-08-20 DIAGNOSIS — E785 Hyperlipidemia, unspecified: Secondary | ICD-10-CM | POA: Diagnosis not present

## 2021-08-20 DIAGNOSIS — I2510 Atherosclerotic heart disease of native coronary artery without angina pectoris: Secondary | ICD-10-CM

## 2021-08-20 DIAGNOSIS — Z9861 Coronary angioplasty status: Secondary | ICD-10-CM

## 2021-08-21 ENCOUNTER — Other Ambulatory Visit: Payer: Self-pay

## 2021-08-21 ENCOUNTER — Encounter: Payer: Self-pay | Admitting: Cardiovascular Disease

## 2021-08-21 ENCOUNTER — Ambulatory Visit (HOSPITAL_BASED_OUTPATIENT_CLINIC_OR_DEPARTMENT_OTHER): Payer: Medicare Other | Attending: Adult Health | Admitting: Pulmonary Disease

## 2021-08-21 ENCOUNTER — Ambulatory Visit: Payer: Medicare Other | Admitting: Cardiovascular Disease

## 2021-08-21 VITALS — BP 105/60 | HR 78 | Ht 72.0 in | Wt 275.0 lb

## 2021-08-21 DIAGNOSIS — G4733 Obstructive sleep apnea (adult) (pediatric): Secondary | ICD-10-CM | POA: Insufficient documentation

## 2021-08-21 DIAGNOSIS — E785 Hyperlipidemia, unspecified: Secondary | ICD-10-CM | POA: Diagnosis not present

## 2021-08-21 DIAGNOSIS — Z9861 Coronary angioplasty status: Secondary | ICD-10-CM

## 2021-08-21 DIAGNOSIS — R0683 Snoring: Secondary | ICD-10-CM

## 2021-08-21 DIAGNOSIS — I10 Essential (primary) hypertension: Secondary | ICD-10-CM | POA: Diagnosis not present

## 2021-08-21 DIAGNOSIS — R600 Localized edema: Secondary | ICD-10-CM

## 2021-08-21 DIAGNOSIS — I2510 Atherosclerotic heart disease of native coronary artery without angina pectoris: Secondary | ICD-10-CM

## 2021-08-21 DIAGNOSIS — I2694 Multiple subsegmental pulmonary emboli without acute cor pulmonale: Secondary | ICD-10-CM

## 2021-08-21 NOTE — Assessment & Plan Note (Signed)
Recent pulmonary emboli in August demonstrated by CTA probably related to 2 back-to-back prolonged car trips currently on Eliquis oral anticoagulation.

## 2021-08-21 NOTE — Patient Instructions (Signed)
Medication Instructions:  Your physician recommends that you continue on your current medications as directed. Please refer to the Current Medication list given to you today.  *If you need a refill on your cardiac medications before your next appointment, please call your pharmacy*   Lab Work: Your physician recommends that you return for lab work in: next week or so for FASTING lipid/liver profile.  If you have labs (blood work) drawn today and your tests are completely normal, you will receive your results only by: [place] (if you have MyChart) OR A paper copy in the mail If you have any lab test that is abnormal or we need to change your treatment, we will call you to review the results.   Follow-Up: At [facility], you and your health needs are our priority.  As part of our continuing mission to provide you with exceptional heart care, we have created designated Provider Care Teams.  These Care Teams include your primary Cardiologist (physician) and Advanced Practice Providers (APPs -  Physician Assistants and Nurse Practitioners) who all work together to provide you with the care you need, when you need it.  We recommend signing up for the patient portal called "MyChart".  Sign up information is provided on this After Visit Summary.  MyChart is used to connect with patients for Virtual Visits (Telemedicine).  Patients are able to view lab/test results, encounter notes, upcoming appointments, etc.  Non-urgent messages can be sent to your provider as well.   To learn more about what you can do with MyChart, go to [email or web address].    Your next appointment:   6 month(s)  The format for your next appointment:   In Person  Provider:   Quay Burow, MD

## 2021-08-21 NOTE — Assessment & Plan Note (Addendum)
History of CAD status post diagnostic cath by Dr. Burt Knack 04/26/2014 revealing noncritical CAD and normal LV function.  He had a Myoview performed 12/05/2016 that showed mild ischemia in the RCA territory.  He was admitted with a non-STEMI 12/26/2019 and underwent diagnostic cath by Dr. Saunders Revel revealing minimal disease in the LAD, 80% mid AV groove circumflex which was stented using a 2.5 mm x 18 mm long Medtronic resolute Onyx drug-eluting stent.  His RCA appeared chronically occluded with left-to-right collaterals.  He had preserved EF in the 50% range with inferior hypokinesia.  He has had some chest pain over the last year or so requiring sublingual nitroglycerin but this is fairly infrequent.  He currently is on 1 baby aspirin.

## 2021-08-21 NOTE — Assessment & Plan Note (Signed)
>>  ASSESSMENT AND PLAN FOR CAD S/P PCI WRITTEN ON 08/21/2021  4:30 PM BY Runell Gess, MD  History of CAD status post diagnostic cath by Dr. Excell Seltzer 04/26/2014 revealing noncritical CAD and normal LV function.  He had a Myoview performed 12/05/2016 that showed mild ischemia in the RCA territory.  He was admitted with a non-STEMI 12/26/2019 and underwent diagnostic cath by Dr. Okey Dupre revealing minimal disease in the LAD, 80% mid AV groove circumflex which was stented using a 2.5 mm x 18 mm long Medtronic resolute Onyx drug-eluting stent.  His RCA appeared chronically occluded with left-to-right collaterals.  He had preserved EF in the 50% range with inferior hypokinesia.  He has had some chest pain over the last year or so requiring sublingual nitroglycerin but this is fairly infrequent.  He currently is on 1 baby aspirin.

## 2021-08-21 NOTE — Assessment & Plan Note (Signed)
History of dyslipidemia on Crestor which he takes intermittently.  His most recent lipid profile performed 07/26/2020 revealed a total cholesterol of 210 and LDL of 146.  Given his CAD I prefer his LDL be less than 70.  I suspect he would benefit from being on a PCSK9.  We will recheck a lipid liver profile.

## 2021-08-21 NOTE — Assessment & Plan Note (Signed)
History of lower extremity edema managed with oral furosemide.

## 2021-08-21 NOTE — Assessment & Plan Note (Signed)
History of essential hypertension a blood pressure measured today at 105/60.  He is on Benicar/hydrochlorothiazide and metoprolol.

## 2021-08-21 NOTE — Progress Notes (Signed)
08/21/2021 [name]   08/10/1946  [id number]  Primary Physician [name], MD Primary Cardiologist: Lorretta Harp MD Lupe Carney, [place]  HPI:  Allen [name] is a 75 y.o.  mildly overweight married Caucasian male father of 54 (4 biologic, 2 adopted), grandfather of 6 grandchildren who  was referred by Dr. Birdie Riddle for cardiovascular evaluation because of chest pain.  He is retired Engineer, structural in [place].  I last saw him in the office 02/09/2020.[name]  He has had cardiac catheterizations in the past, one remotely and 1 performed by Dr. Burt Knack 04/26/14 revealing noncritical CAD with normal LV function. His pain at that time was thought to be noncardiac. His problems include remote tobacco abuse having smoked 35-50 pack years and stopped 20 years ago. He also has a History of hypertension and hyperlipidemia. He has never had a heart attack or stroke. He's had chest pain which began a year ago that occurs monthly. He experienced 3 episodes over Christmas which were more worrisome.   I performed Myoview stress testing on him 12/05/2016 that showed mild ischemia in the RCA territory.  Apparently at his previous cath in 2015 he did have moderate RCA disease.  I elected to treat him medically at that time.  He was admitted 12/26/2019 with a non-STEMI and underwent cardiac catheterization the following day by Dr. Saunders Revel revealing minimal disease in the LAD, 80% mid AV groove circumflex with which was stented with a 2.5 mm x 18 mm long Medtronic resolute Onyx drug-eluting stent.  His RCA was occluded with left-to-right collaterals and his EF was preserved in the 50% range with inferior hypokinesia.  Since I saw him a year and a half ago he was admitted to the hospital 05/23/2021 for 2 days because of bilateral pulmonary emboli.  He was placed on Eliquis oral anticoagulation.  His symptoms have improved.  He has had several episodes of nitroglycerin responsive chest pain in the last year but  these are fairly infrequent.   Current Meds  Medication Sig   albuterol (VENTOLIN HFA) 108 (90 Base) MCG/ACT inhaler TAKE 2 PUFFS BY MOUTH EVERY 6 HOURS AS NEEDED FOR WHEEZE OR SHORTNESS OF BREATH   allopurinol (ZYLOPRIM) 100 MG tablet Take 1 tablet (100 mg total) by mouth daily.   allopurinol (ZYLOPRIM) 100 MG tablet allopurinol 100 mg tablet  Take 2 tablets every day by oral route for 90 days.   apixaban (ELIQUIS) 5 MG TABS tablet Take 1 tablet (5 mg total) by mouth 2 (two) times daily.   aspirin EC 81 MG EC tablet Take 1 tablet (81 mg total) by mouth daily.   budesonide-formoterol (SYMBICORT) 80-4.5 MCG/ACT inhaler TAKE 2 PUFFS BY MOUTH TWICE A DAY   buPROPion (WELLBUTRIN XL) 150 MG 24 hr tablet TAKE 1 TABLET BY MOUTH EVERY DAY   clonazePAM (KLONOPIN) 0.5 MG tablet TAKE 1 TABLET BY MOUTH TWICE A DAY AS NEEDED FOR ANXIETY   colchicine 0.6 MG tablet Take 1 tablet (0.6 mg total) by mouth daily. Until flare resolves. Do not take rosuvastatin or aspirin when taking this medication.   furosemide (LASIX) 20 MG tablet Take 1 tablet (20 mg total) by mouth daily.   metoprolol tartrate (LOPRESSOR) 25 MG tablet TAKE 0.5 TABLETS (12.5 MG TOTAL) BY MOUTH 2 (TWO) TIMES DAILY.   nitroGLYCERIN (NITROSTAT) 0.4 MG SL tablet Place 1 tablet (0.4 mg total) under the tongue every 5 (five) minutes as needed for chest pain.   olmesartan-hydrochlorothiazide (BENICAR  HCT) 20-12.5 MG tablet Take 1 tablet by mouth daily.   rosuvastatin (CRESTOR) 20 MG tablet TAKE 1 TABLET BY MOUTH EVERY DAY     Allergies  Allergen Reactions   Sulfa Antibiotics Hives   Advil [Ibuprofen] Swelling and Other (See Comments)    Lip swelling   Atorvastatin     Muscle pain   Penicillins Hives and Other (See Comments)    Has patient had a PCN reaction causing immediate rash, facial/tongue/throat swelling, SOB or lightheadedness with hypotension: no Has patient had a PCN reaction causing severe rash involving mucus membranes or skin  necrosis: no Has patient had a PCN reaction that required hospitalization no - childhood reaction Has patient had a PCN reaction occurring within the last 10 years: no - childhood reaction If all of the above answers are "NO", then may proceed with Cephalosporin use.    Sulfonamide Derivatives Hives    Social History   Socioeconomic History   Marital status: Married    Spouse name: Not on file   Number of children: 6   Years of education: Not on file   Highest education level: Not on file  Occupational History   Occupation: retired  Tobacco Use   Smoking status: Former    Packs/day: 2.00    Years: 35.00    Pack years: 70.00    Types: Cigarettes    Quit date: 10/21/1997    Years since quitting: 23.8   Smokeless tobacco: Never   Tobacco comments:    2-3 ppd  Substance and Sexual Activity   Alcohol use: No    Alcohol/week: 0.0 standard drinks   Drug use: No   Sexual activity: Yes    Birth control/protection: None  Other Topics Concern   Not on file  Social History Narrative   Married, 6 kids (2 adopted).   Retired Engineer, structural.   Former smoker.  No signif alc.     No drugs.      Social Determinants of Health   Financial Resource Strain: Low Risk    Difficulty of Paying Living Expenses: Not hard at all  Food Insecurity: No Food Insecurity   Worried About Charity fundraiser in the Last Year: Never true   [place] in the Last Year: Never true  Transportation Needs: No Transportation Needs   Lack of Transportation (Medical): No   Lack of Transportation (Non-Medical): No  Physical Activity: Inactive   Days of Exercise per Week: 0 days   Minutes of Exercise per Session: 0 min  Stress: No Stress Concern Present   Feeling of Stress : Not at all  Social Connections: Moderately Integrated   Frequency of Communication with Friends and Family: More than three times a week   Frequency of Social Gatherings with Friends and Family: More than three times a week    Attends Religious Services: More than 4 times per year   Active Member of [employer] or Organizations: No   Attends Archivist Meetings: Never   Marital Status: Married  Human resources officer Violence: Not At Risk   Fear of Current or Ex-Partner: No   Emotionally Abused: No   Physically Abused: No   Sexually Abused: No     Review of Systems: General: negative for chills, fever, night sweats or weight changes.  Cardiovascular: negative for chest pain, dyspnea on exertion, edema, orthopnea, palpitations, paroxysmal nocturnal dyspnea or shortness of breath Dermatological: negative for rash Respiratory: negative for cough or wheezing Urologic: negative for hematuria  Abdominal: negative for nausea, vomiting, diarrhea, bright red blood per rectum, melena, or hematemesis Neurologic: negative for visual changes, syncope, or dizziness All other systems reviewed and are otherwise negative except as noted above.    Blood pressure 105/60, pulse 78, height 6' (1.829 m), weight 275 lb (124.7 kg), SpO2 97 %.  General appearance: alert and no distress Neck: no adenopathy, no carotid bruit, no JVD, supple, symmetrical, trachea midline, and thyroid not enlarged, symmetric, no tenderness/mass/nodules Lungs: clear to auscultation bilaterally Heart: regular rate and rhythm, S1, S2 normal, no murmur, click, rub or gallop Extremities: extremities normal, atraumatic, no cyanosis or edema Pulses: 2+ and symmetric Skin: Skin color, texture, turgor normal. No rashes or lesions Neurologic: Grossly normal  EKG sinus rhythm at 78 with nonspecific ST and T wave changes.  I personally reviewed this EKG.  ASSESSMENT AND PLAN:   Dyslipidemia, goal LDL below 70 History of dyslipidemia on Crestor which he takes intermittently.  His most recent lipid profile performed 07/26/2020 revealed a total cholesterol of 210 and LDL of 146.  Given his CAD I prefer his LDL be less than 70.  I suspect he would benefit from being  on a PCSK9.  We will recheck a lipid liver profile.  Essential hypertension History of essential hypertension a blood pressure measured today at 105/60.  He is on Benicar/hydrochlorothiazide and metoprolol.  CAD S/P PCI History of CAD status post diagnostic cath by Dr. Burt Knack 04/26/2014 revealing noncritical CAD and normal LV function.  He had a Myoview performed 12/05/2016 that showed mild ischemia in the RCA territory.  He was admitted with a non-STEMI 12/26/2019 and underwent diagnostic cath by Dr. Saunders Revel revealing minimal disease in the LAD, 80% mid AV groove circumflex which was stented using a 2.5 mm x 18 mm long Medtronic resolute Onyx drug-eluting stent.  His RCA appeared chronically occluded with left-to-right collaterals.  He had preserved EF in the 50% range with inferior hypokinesia.  He has had some chest pain over the last year or so requiring sublingual nitroglycerin but this is fairly infrequent.  He currently is on 1 baby aspirin.  Lower extremity edema History of lower extremity edema managed with oral furosemide.  Pulmonary embolus (HCC) Recent pulmonary emboli in August demonstrated by CTA probably related to 2 back-to-back prolonged car trips currently on Eliquis oral anticoagulation.     Lorretta Harp MD FACP,FACC,FAHA, [facility] 08/21/2021 4:31 PM

## 2021-08-26 DIAGNOSIS — J449 Chronic obstructive pulmonary disease, unspecified: Secondary | ICD-10-CM | POA: Diagnosis not present

## 2021-08-27 ENCOUNTER — Telehealth: Payer: Self-pay | Admitting: Adult Health

## 2021-08-27 DIAGNOSIS — G4733 Obstructive sleep apnea (adult) (pediatric): Secondary | ICD-10-CM

## 2021-08-27 NOTE — Telephone Encounter (Signed)
Mild obstructive sleep apnea occurred during the diagnostic portion of the study (AHI = 12.3 /hour). - Moderate oxygen desaturation was noted during the diagnostic portion of the study (Min O2 = 79.0%).         RECOMMENDATIONS - Trial of BiPAP therapy on 29/25 cm H2O with a Medium size Resmed Full Face Mask Mirage Quattro mask and heated humidification.Prferably use autoBIPAP with max IPAP of 20cm, PS +4 cm ,min EPAP 10 cm for better tolerance. IPAP can be subsequently increased if evnts are found to persist on review of download - Avoid alcohol, sedatives and other CNS    Please set up office visit or video visit to discuss results and treatment options

## 2021-08-27 NOTE — Telephone Encounter (Signed)
I have called the pt and he is aware of results per TP.  I have scheduled pt to video visit for tomorrow to discuss these results.  Nothing further is needed.

## 2021-08-27 NOTE — Procedures (Signed)
Patient Name: Allen [name], Allen [name] Date: 08/21/2021 Gender: Male D.O.B: 01-Jan-1946 Age (years): 74 Referring Provider: Tammy Parrett Height (inches): 72 Interpreting Physician: Kara Mead MD, ABSM Weight (lbs): 275 RPSGT: Carolin Coy BMI: 37 MRN: [id number] Neck Size: 17.50 <br> <br> CLINICAL INFORMATION The patient is referred for a split night study with BPAP. MEDICATIONS Medications self-administered by patient taken the night of the study : N/A  SLEEP STUDY TECHNIQUE As per the AASM Manual for the Scoring of Sleep and Associated Events v2.3 (April 2016) with a hypopnea requiring 4% desaturations.  The channels recorded and monitored were frontal, central and occipital EEG, electrooculogram (EOG), submentalis EMG (chin), nasal and oral airflow, thoracic and abdominal wall motion, anterior tibialis EMG, snore microphone, electrocardiogram, and pulse oximetry. Bi-level positive airway pressure (BiPAP) was initiated when the patient met split night criteria and was titrated according to treat sleep-disordered breathing.  RESPIRATORY PARAMETERS Diagnostic  Total AHI (/hr): 12.3 RDI (/hr): 23.4 OA Index (/hr): - CA Index (/hr): 0.0 REM AHI (/hr): 26.3 NREM AHI (/hr): 11.1 Supine AHI (/hr): N/A Non-supine AHI (/hr): 23.44 Min O2 Sat (%): 79.0 Mean O2 (%): 91.4 Time below 88% (min): 10.5   Titration  Optimal IPAP Pressure (cm): 29 Optimal EPAP Pressure (cm): 25 AHI at Optimal Pressure (/hr): 12 Min O2 at Optimal Pressure (%): 93.0 Sleep % at Optimal (%): 19 Supine % at Optimal (%): 0     SLEEP ARCHITECTURE The study was initiated at 10:23:05 PM and terminated at 4:52:13 AM. The total recorded time was 389.1 minutes. EEG confirmed total sleep time was 271.5 minutes yielding a sleep efficiency of 69.8%%. Sleep onset after lights out was 0.3 minutes with a REM latency of 45.5 minutes. The patient spent 33.7%% of the night in stage N1 sleep, 51.9%% in stage N2 sleep, 0.0%% in stage  N3 and 14.4% in REM. Wake after sleep onset (WASO) was 117.3 minutes. The Arousal Index was 42.7/hour.  LEG MOVEMENT DATA The total Periodic Limb Movements of Sleep (PLMS) were 0. The PLMS index was 0.0 .  CARDIAC DATA The 2 lead EKG demonstrated sinus rhythm. The mean heart rate was 100.0 beats per minute. Other EKG findings include: None.   IMPRESSIONS - Mild obstructive sleep apnea occurred during the diagnostic portion of the study (AHI = 12.3 /hour). An optimal PAP pressure was selected for this patient ( 29 / 25cm of water) - Moderate oxygen desaturation was noted during the diagnostic portion of the study (Min O2 = 79.0%). - The patient snored with moderate snoring volume during the diagnostic portion of the study. - No cardiac abnormalities were noted during this study. - Clinically significant periodic limb movements of sleep did not occur during the study.   DIAGNOSIS - Obstructive Sleep Apnea (G47.33)   RECOMMENDATIONS - Trial of BiPAP therapy on 29/25 cm H2O with a Medium size Resmed Full Face Mask Mirage Quattro mask and heated humidification.Prferably use autoBIPAP with max IPAP of 20cm, PS +4 cm ,min EPAP 10 cm for better tolerance. IPAP can be subsequently increased if evnts are found to persist on review of download - Avoid alcohol, sedatives and other CNS depressants that may worsen sleep apnea and disrupt normal sleep architecture. - Sleep hygiene should be reviewed to assess factors that may improve sleep quality. - Weight management and regular exercise should be initiated or continued. - Return to [facility] Center for re-evaluation.   Kara Mead MD Board Certified in [place]

## 2021-08-28 ENCOUNTER — Encounter: Payer: Self-pay | Admitting: Adult Health

## 2021-08-28 ENCOUNTER — Telehealth (INDEPENDENT_AMBULATORY_CARE_PROVIDER_SITE_OTHER): Payer: Medicare Other | Admitting: Adult Health

## 2021-08-28 ENCOUNTER — Other Ambulatory Visit: Payer: Self-pay

## 2021-08-28 DIAGNOSIS — J9611 Chronic respiratory failure with hypoxia: Secondary | ICD-10-CM | POA: Diagnosis not present

## 2021-08-28 DIAGNOSIS — J449 Chronic obstructive pulmonary disease, unspecified: Secondary | ICD-10-CM

## 2021-08-28 DIAGNOSIS — Z7901 Long term (current) use of anticoagulants: Secondary | ICD-10-CM | POA: Diagnosis not present

## 2021-08-28 DIAGNOSIS — R0683 Snoring: Secondary | ICD-10-CM | POA: Diagnosis not present

## 2021-08-28 DIAGNOSIS — G4733 Obstructive sleep apnea (adult) (pediatric): Secondary | ICD-10-CM | POA: Diagnosis not present

## 2021-08-28 DIAGNOSIS — I2694 Multiple subsegmental pulmonary emboli without acute cor pulmonale: Secondary | ICD-10-CM

## 2021-08-28 DIAGNOSIS — R400 Somnolence: Secondary | ICD-10-CM

## 2021-08-28 DIAGNOSIS — I2699 Other pulmonary embolism without acute cor pulmonale: Secondary | ICD-10-CM | POA: Diagnosis not present

## 2021-08-28 NOTE — Addendum Note (Signed)
Addended by: Fenton Foy on: 08/28/2021 05:17 PM   Modules accepted: Orders
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
## 2021-08-28 NOTE — Patient Instructions (Addendum)
Begin BIPAP At bedtime  , wear all night while sleeping  Work on healthy weight loss Do not drive if sleepy.   Continue Symbicort 2 puffs Twice daily, rinse after use.   Continue on Eliquis 5mg  Twice daily   Follow up with Dr. Ander Slade in 3 months and As needed

## 2021-08-29 NOTE — Addendum Note (Signed)
Addended by: Fenton Foy on: 08/29/2021 09:12 AM   Modules accepted: Orders

## 2021-08-31 ENCOUNTER — Other Ambulatory Visit: Payer: Self-pay | Admitting: Cardiovascular Disease

## 2021-09-07 ENCOUNTER — Ambulatory Visit: Payer: Medicare Other | Admitting: Student

## 2021-09-17 ENCOUNTER — Ambulatory Visit (INDEPENDENT_AMBULATORY_CARE_PROVIDER_SITE_OTHER): Payer: Medicare Other | Admitting: Podiatry

## 2021-09-17 DIAGNOSIS — M79675 Pain in left toe(s): Secondary | ICD-10-CM

## 2021-09-17 DIAGNOSIS — M79674 Pain in right toe(s): Secondary | ICD-10-CM

## 2021-09-17 DIAGNOSIS — B351 Tinea unguium: Secondary | ICD-10-CM

## 2021-09-19 DIAGNOSIS — G4733 Obstructive sleep apnea (adult) (pediatric): Secondary | ICD-10-CM | POA: Diagnosis not present

## 2021-09-25 DIAGNOSIS — J449 Chronic obstructive pulmonary disease, unspecified: Secondary | ICD-10-CM | POA: Diagnosis not present

## 2021-09-27 NOTE — Progress Notes (Signed)
No show  Gardiner Barefoot DPM

## 2021-10-04 ENCOUNTER — Ambulatory Visit: Payer: Medicare Other | Admitting: Pulmonary Disease

## 2021-10-04 ENCOUNTER — Other Ambulatory Visit: Payer: Self-pay

## 2021-10-04 ENCOUNTER — Encounter: Payer: Self-pay | Admitting: Pulmonary Disease

## 2021-10-04 ENCOUNTER — Ambulatory Visit: Payer: Medicare Other | Admitting: Adult Health

## 2021-10-04 VITALS — BP 110/70 | HR 66 | Temp 97.9°F | Ht 72.0 in | Wt 278.0 lb

## 2021-10-04 DIAGNOSIS — I2694 Multiple subsegmental pulmonary emboli without acute cor pulmonale: Secondary | ICD-10-CM | POA: Diagnosis not present

## 2021-10-04 DIAGNOSIS — G4733 Obstructive sleep apnea (adult) (pediatric): Secondary | ICD-10-CM | POA: Diagnosis not present

## 2021-10-04 DIAGNOSIS — Z9989 Dependence on other enabling machines and devices: Secondary | ICD-10-CM
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
## 2021-10-04 NOTE — Patient Instructions (Signed)
We will contact the medical supply company to adjust the humidification in your machine  Continue using the BiPAP nightly as tolerated  I will see you in about 6 to 8 weeks  Loss with significant concerns if you are still having significant difficulty with the BiPAP   Graded exercises as tolerated Continue weight loss efforts

## 2021-10-26 DIAGNOSIS — J449 Chronic obstructive pulmonary disease, unspecified: Secondary | ICD-10-CM | POA: Diagnosis not present

## 2021-10-29 ENCOUNTER — Ambulatory Visit: Payer: Medicare HMO

## 2021-11-19 ENCOUNTER — Ambulatory Visit: Payer: Medicare Other | Admitting: Pulmonary Disease

## 2021-11-26 DIAGNOSIS — J449 Chronic obstructive pulmonary disease, unspecified: Secondary | ICD-10-CM | POA: Diagnosis not present

## 2021-11-27 ENCOUNTER — Ambulatory Visit: Payer: Medicare Other | Admitting: Adult Health

## 2021-11-29 ENCOUNTER — Encounter: Payer: Self-pay | Admitting: Adult Health

## 2021-11-29 ENCOUNTER — Ambulatory Visit: Payer: Medicare Other | Admitting: Adult Health

## 2021-11-29 ENCOUNTER — Other Ambulatory Visit: Payer: Self-pay

## 2021-11-29 DIAGNOSIS — I2694 Multiple subsegmental pulmonary emboli without acute cor pulmonale: Secondary | ICD-10-CM | POA: Diagnosis not present

## 2021-11-29 DIAGNOSIS — J438 Other emphysema: Secondary | ICD-10-CM | POA: Diagnosis not present

## 2021-11-29 DIAGNOSIS — J9611 Chronic respiratory failure with hypoxia: Secondary | ICD-10-CM | POA: Diagnosis not present

## 2021-11-29 DIAGNOSIS — G4733 Obstructive sleep apnea (adult) (pediatric): Secondary | ICD-10-CM

## 2021-11-29 HISTORY — DX: Obstructive sleep apnea (adult) (pediatric): G47.33

## 2021-11-29 NOTE — Assessment & Plan Note (Signed)
Continue on oxygen with activity and at bedtime to maintain O2 saturations greater than 88 to 90%  Plan  . Patient Instructions  Continue Symbicort 2 puffs Twice daily, rinse after use. Albuterol inhaler As needed   Activity as tolerated.    Continue on Eliquis 5mg  Twice daily  Avoid NSAIDS- no advil, motrin or aleve.   Continue on Oxygen 2l/m with activity and At bedtime .  Do not drive if sleepy .  Work on healthy weight loss.   Follow up with Dr. Ander Slade or Laurielle Selmon NP in 3 months and As needed

## 2021-11-29 NOTE — Assessment & Plan Note (Signed)
Currently under good control.  Continue on current regimen  Plan  Patient Instructions  Continue Symbicort 2 puffs Twice daily, rinse after use. Albuterol inhaler As needed   Activity as tolerated.    Continue on Eliquis 5mg  Twice daily  Avoid NSAIDS- no advil, motrin or aleve.   Continue on Oxygen 2l/m with activity and At bedtime .  Do not drive if sleepy .  Work on healthy weight loss.   Follow up with Dr. Ander Slade or [place] Delauder NP in 3 months and As needed

## 2021-11-29 NOTE — Patient Instructions (Addendum)
Continue Symbicort 2 puffs Twice daily, rinse after use. Albuterol inhaler As needed   Activity as tolerated.    Continue on Eliquis 5mg  Twice daily  Avoid NSAIDS- no advil, motrin or aleve.   Continue on Oxygen 2l/m with activity and At bedtime .  Do not drive if sleepy .  Work on healthy weight loss.   Follow up with Dr. Ander Slade or Kensley Lares NP in 3 months and As needed

## 2021-11-29 NOTE — Assessment & Plan Note (Signed)
History of provoked PE diagnosed in August 2022.  We discussed treatment plan with at least Eliquis for 6 to 12 months.  Patient does have multiple risk factors including morbid obesity chronic lower extremity edema.  He does try to be active but is limited by knee issues.  For now we will plan for at least 12 months of therapy.  Patient does inquire about lifelong therapy as he is worried about his increased risk factors.  Could consider low-dose after 12 months of therapy  Plan  Patient Instructions  Continue Symbicort 2 puffs Twice daily, rinse after use. Albuterol inhaler As needed   Activity as tolerated.    Continue on Eliquis 5mg  Twice daily  Avoid NSAIDS- no advil, motrin or aleve.   Continue on Oxygen 2l/m with activity and At bedtime .  Do not drive if sleepy .  Work on healthy weight loss.   Follow up with Dr. Ander Slade or Renad Jenniges NP in 3 months and As needed

## 2021-11-29 NOTE — Assessment & Plan Note (Signed)
Mild obstructive sleep apnea.  Unfortunately patient was unable to tolerate BiPAP.  Patient education was given on sleep apnea.  Patient is encouraged on healthy weight loss.  Positional sleep.  Healthy sleep regimen discussed - discussed how weight can impact sleep and risk for sleep disordered breathing - discussed options to assist with weight loss: combination of diet modification, cardiovascular and strength training exercises   - had an extensive discussion regarding the adverse health consequences related to untreated sleep disordered breathing - specifically discussed the risks for hypertension, coronary artery disease, cardiac dysrhythmias, cerebrovascular disease, and diabetes - lifestyle modification discussed   - discussed how sleep disruption can increase risk of accidents, particularly when driving - safe driving practices were discussed    Plan  Patient Instructions  Continue Symbicort 2 puffs Twice daily, rinse after use. Albuterol inhaler As needed   Activity as tolerated.    Continue on Eliquis 5mg  Twice daily  Avoid NSAIDS- no advil, motrin or aleve.   Continue on Oxygen 2l/m with activity and At bedtime .  Do not drive if sleepy .  Work on healthy weight loss.   Follow up with Dr. Ander Slade or Courtland Reas NP in 3 months and As needed
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
## 2021-12-24 DIAGNOSIS — J449 Chronic obstructive pulmonary disease, unspecified: Secondary | ICD-10-CM | POA: Diagnosis not present

## 2022-01-18 DIAGNOSIS — H1033 Unspecified acute conjunctivitis, bilateral: Secondary | ICD-10-CM | POA: Diagnosis not present

## 2022-01-24 DIAGNOSIS — J449 Chronic obstructive pulmonary disease, unspecified: Secondary | ICD-10-CM | POA: Diagnosis not present

## 2022-02-23 DIAGNOSIS — J449 Chronic obstructive pulmonary disease, unspecified: Secondary | ICD-10-CM | POA: Diagnosis not present

## 2022-02-27 ENCOUNTER — Encounter: Payer: Self-pay | Admitting: Adult Health

## 2022-02-27 ENCOUNTER — Ambulatory Visit (INDEPENDENT_AMBULATORY_CARE_PROVIDER_SITE_OTHER): Payer: Medicare Other | Admitting: Adult Health

## 2022-02-27 VITALS — BP 130/64 | HR 62 | Temp 98.1°F | Ht 72.0 in | Wt 280.6 lb

## 2022-02-27 DIAGNOSIS — G4733 Obstructive sleep apnea (adult) (pediatric): Secondary | ICD-10-CM

## 2022-02-27 DIAGNOSIS — I2694 Multiple subsegmental pulmonary emboli without acute cor pulmonale: Secondary | ICD-10-CM

## 2022-02-27 DIAGNOSIS — I2510 Atherosclerotic heart disease of native coronary artery without angina pectoris: Secondary | ICD-10-CM

## 2022-02-27 DIAGNOSIS — J9611 Chronic respiratory failure with hypoxia: Secondary | ICD-10-CM | POA: Diagnosis not present

## 2022-02-27 DIAGNOSIS — J438 Other emphysema: Secondary | ICD-10-CM | POA: Diagnosis not present

## 2022-02-27 DIAGNOSIS — Z9861 Coronary angioplasty status: Secondary | ICD-10-CM

## 2022-02-27 NOTE — Assessment & Plan Note (Signed)
Hx of provoked PE (Car travel) 05/2022. Tx with Eliquis x 1 year . Consider low dose for life long afterwards as chronic edema , obesity , sedentary .  ? ?Plan  ?Patient Instructions  ?Continue Symbicort 2 puffs Twice daily, rinse after use. ?Albuterol inhaler As needed   ?Activity as tolerated.  ?  ?Continue on Eliquis '5mg'$  Twice daily  ?Avoid NSAIDS- no advil, motrin or aleve.  ? ?Continue on Oxygen 2l/m with activity and At bedtime .  ?Do not drive if sleepy .  ?Work on healthy weight loss.  ? ?Follow up with Dr. Ander Slade or Damonica Chopra NP in 4 months and As needed   ? ? ?  ? ?
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
## 2022-02-27 NOTE — Assessment & Plan Note (Signed)
Cont current regimen and follow up with cards  ?

## 2022-02-27 NOTE — Patient Instructions (Addendum)
Continue Symbicort 2 puffs Twice daily, rinse after use. ?Albuterol inhaler As needed   ?Activity as tolerated.  ?  ?Continue on Eliquis '5mg'$  Twice daily  ?Avoid NSAIDS- no advil, motrin or aleve.  ? ?Continue on Oxygen 2l/m with activity and At bedtime .  ?Do not drive if sleepy .  ?Work on healthy weight loss.  ? ?Follow up with Dr. Ander Slade or Cheron Coryell NP in 4 months and As needed   ? ? ?

## 2022-02-27 NOTE — Assessment & Plan Note (Signed)
>>  ASSESSMENT AND PLAN FOR CAD S/P PCI WRITTEN ON 02/27/2022 11:51 AM BY PARRETT, TAMMY S, NP  Cont current regimen and follow up with cards

## 2022-02-27 NOTE — Assessment & Plan Note (Signed)
BIPAP intolerant .  ?Do not drive if sleepy  ?Pat education given .  ?

## 2022-02-27 NOTE — Assessment & Plan Note (Signed)
Compensated on present regimen  ? ?Plan  ?Patient Instructions  ?Continue Symbicort 2 puffs Twice daily, rinse after use. ?Albuterol inhaler As needed   ?Activity as tolerated.  ?  ?Continue on Eliquis '5mg'$  Twice daily  ?Avoid NSAIDS- no advil, motrin or aleve.  ? ?Continue on Oxygen 2l/m with activity and At bedtime .  ?Do not drive if sleepy .  ?Work on healthy weight loss.  ? ?Follow up with Allen [name] or Allen Coppin NP in 4 months and As needed   ? ? ?  ? ?

## 2022-02-27 NOTE — Assessment & Plan Note (Signed)
Cont on O2 with act and At bedtime   

## 2022-03-26 DIAGNOSIS — J449 Chronic obstructive pulmonary disease, unspecified: Secondary | ICD-10-CM | POA: Diagnosis not present

## 2022-03-27 ENCOUNTER — Other Ambulatory Visit: Payer: Self-pay | Admitting: Cardiovascular Disease

## 2022-03-29 ENCOUNTER — Ambulatory Visit: Payer: Medicare Other | Admitting: General Practice
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
## 2022-04-09 ENCOUNTER — Ambulatory Visit: Payer: Medicare Other | Admitting: Nurse Practitioner

## 2022-04-09 ENCOUNTER — Encounter: Payer: Self-pay | Admitting: General Practice

## 2022-04-09 ENCOUNTER — Other Ambulatory Visit: Payer: Self-pay | Admitting: Nurse Practitioner

## 2022-04-09 VITALS — BP 118/66 | HR 70 | Ht 71.0 in | Wt 277.4 lb

## 2022-04-09 DIAGNOSIS — I2694 Multiple subsegmental pulmonary emboli without acute cor pulmonale: Secondary | ICD-10-CM

## 2022-04-09 DIAGNOSIS — E785 Hyperlipidemia, unspecified: Secondary | ICD-10-CM

## 2022-04-09 DIAGNOSIS — I10 Essential (primary) hypertension: Secondary | ICD-10-CM

## 2022-04-09 DIAGNOSIS — Z9861 Coronary angioplasty status: Secondary | ICD-10-CM

## 2022-04-09 DIAGNOSIS — I5030 Unspecified diastolic (congestive) heart failure: Secondary | ICD-10-CM

## 2022-04-09 DIAGNOSIS — I771 Stricture of artery: Secondary | ICD-10-CM

## 2022-04-09 DIAGNOSIS — I2510 Atherosclerotic heart disease of native coronary artery without angina pectoris: Secondary | ICD-10-CM

## 2022-04-09 MED ORDER — ISOSORBIDE MONONITRATE ER 30 MG PO TB24: 15.0000 mg | ORAL_TABLET | Freq: Every day | ORAL | 6 refills | Status: DC

## 2022-04-09 MED ORDER — POTASSIUM CHLORIDE ER 10 MEQ PO TBCR: 10.0000 meq | EXTENDED_RELEASE_TABLET | Freq: Every day | ORAL | 0 refills | Status: DC

## 2022-04-09 MED ORDER — FUROSEMIDE 20 MG PO TABS: 20.0000 mg | ORAL_TABLET | Freq: Every day | ORAL | 3 refills | Status: DC

## 2022-04-09 MED ORDER — METOPROLOL SUCCINATE ER 50 MG PO TB24: 50.0000 mg | ORAL_TABLET | Freq: Every day | ORAL | 6 refills | Status: DC

## 2022-04-09 NOTE — Patient Instructions (Signed)
Medication Instructions:  STOP METOPROLOL TARTRATE  START METOPROLOL SUCCINATE '25MG'$  DAILY  INCREASE FUROSEMIDE '40MG'$  x3 DAYS THEN BACK TO '20MG'$  DAILY  TAKE POTASSIUM 10MEQ x3 DAYS WITH INCREASED LASIX THEN STOP  START IMDUR '15MG'$  (1/2 TAB) DAILY  *If you need a refill on your cardiac medications before your next appointment, please call your pharmacy*  Lab Work:    LIPID PANEL TODAY AND   BMET IN 1 WEEK-THIS IS NOT FASTING  If you have labs (blood work) drawn today and your tests are completely normal, you will receive your results only by: [place] (if you have MyChart) OR  A paper copy in the mail If you have any lab test that is abnormal or we need to change your treatment, we will call you to review the results.  Special Instructions TAKE AND LOG YOUR WEIGHT DAILY-3 POUNDS IN A DAY OR 5 POUNDS IN A WEEK  Follow-Up: Your next appointment:  1 month(s) In Person with Quay Burow, MD  or  ANY APP      At [facility], you and your health needs are our priority.  As part of our continuing mission to provide you with exceptional heart care, we have created designated Provider Care Teams.  These Care Teams include your primary Cardiologist (physician) and Advanced Practice Providers (APPs -  Physician Assistants and Nurse Practitioners) who all work together to provide you with the care you need, when you need it.    Important Information About Sugar

## 2022-04-10 ENCOUNTER — Other Ambulatory Visit: Payer: Self-pay | Admitting: *Deleted

## 2022-04-10 DIAGNOSIS — E785 Hyperlipidemia, unspecified: Secondary | ICD-10-CM

## 2022-04-10 LAB — LIPID PANEL
Chol/HDL Ratio: 5.4 ratio — ABNORMAL HIGH (ref 0.0–5.0)
Cholesterol, Total: 195 mg/dL (ref 100–199)
HDL: 36 mg/dL — ABNORMAL LOW (ref >39)
LDL Chol Calc (NIH): 112 mg/dL — ABNORMAL HIGH (ref 0–99)
Triglycerides: 269 mg/dL — ABNORMAL HIGH (ref 0–149)
VLDL Cholesterol Cal: 47 mg/dL — ABNORMAL HIGH (ref 5–40)

## 2022-04-19 ENCOUNTER — Encounter: Payer: Self-pay | Admitting: Pharmacist

## 2022-04-19 ENCOUNTER — Ambulatory Visit: Payer: Medicare Other | Admitting: Pharmacist

## 2022-04-19 DIAGNOSIS — I2583 Coronary atherosclerosis due to lipid rich plaque: Secondary | ICD-10-CM | POA: Diagnosis not present

## 2022-04-19 DIAGNOSIS — E785 Hyperlipidemia, unspecified: Secondary | ICD-10-CM

## 2022-04-19 DIAGNOSIS — I2510 Atherosclerotic heart disease of native coronary artery without angina pectoris: Secondary | ICD-10-CM | POA: Diagnosis not present

## 2022-04-19 MED ORDER — ROSUVASTATIN CALCIUM 40 MG PO TABS: 40.0000 mg | ORAL_TABLET | Freq: Every day | ORAL | 3 refills | Status: DC
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
## 2022-04-19 NOTE — Patient Instructions (Signed)
It was nice meeting you today  We would like to increase your rosuvastatin to '40mg'$  once a day.  You can take two of your '20mg'$  tablets until they run out  We will also start a new medication called Repatha or Praluent.  Both are given once every 2 weeks  I will complete the prior authorization for you and contact you when it is approved  Once you start the medication we will recheck your cholesterol in about 2-3 months  Please call or message with any questions  Karren Cobble, PharmD, [place], [place], [address], [place], [place], [address] Phone: [phone], Fax: [phone]

## 2022-04-24 ENCOUNTER — Telehealth: Payer: Self-pay | Admitting: Pulmonary Disease

## 2022-04-24 ENCOUNTER — Other Ambulatory Visit: Payer: Self-pay | Admitting: Family Medicine

## 2022-04-24 DIAGNOSIS — J438 Other emphysema: Secondary | ICD-10-CM

## 2022-04-24 MED ORDER — APIXABAN 5 MG PO TABS: 5.0000 mg | ORAL_TABLET | Freq: Two times a day (BID) | ORAL | 5 refills | Status: DC

## 2022-04-24 NOTE — Telephone Encounter (Signed)
I left a brief message per DPR that a refill of Eliquis was sent to [facility] in [place] per patient request. He was asked to call back with any questions.

## 2022-04-25 DIAGNOSIS — J449 Chronic obstructive pulmonary disease, unspecified: Secondary | ICD-10-CM | POA: Diagnosis not present
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
## 2022-05-06 ENCOUNTER — Encounter: Payer: Self-pay | Admitting: General Practice

## 2022-05-06 ENCOUNTER — Ambulatory Visit: Payer: Medicare Other | Admitting: General Practice

## 2022-05-06 VITALS — BP 128/70 | HR 69 | Ht 72.0 in | Wt 277.0 lb

## 2022-05-06 DIAGNOSIS — I2583 Coronary atherosclerosis due to lipid rich plaque: Secondary | ICD-10-CM

## 2022-05-06 DIAGNOSIS — I10 Essential (primary) hypertension: Secondary | ICD-10-CM

## 2022-05-06 DIAGNOSIS — I2694 Multiple subsegmental pulmonary emboli without acute cor pulmonale: Secondary | ICD-10-CM

## 2022-05-06 DIAGNOSIS — I5030 Unspecified diastolic (congestive) heart failure: Secondary | ICD-10-CM | POA: Diagnosis not present

## 2022-05-06 DIAGNOSIS — I2510 Atherosclerotic heart disease of native coronary artery without angina pectoris: Secondary | ICD-10-CM

## 2022-05-06 NOTE — Addendum Note (Signed)
Addended by: Waylan Rocher on: 05/06/2022 03:14 PM   Modules accepted: Orders

## 2022-05-06 NOTE — Patient Instructions (Addendum)
Medication Instructions:  The current medical regimen is effective;  continue present plan and medications as directed. Please refer to the Current Medication list given to you today.   *If you need a refill on your cardiac medications before your next appointment, please call your pharmacy*  Lab Work:   Testing/Procedures:  BMET TODAY   NONE  If you have labs (blood work) drawn today and your tests are completely normal, you will receive your results only by: [place] (if you have MyChart) OR  A paper copy in the mail If you have any lab test that is abnormal or we need to change your treatment, we will call you to review the results.  Special Instructions PLEASE READ AND FOLLOW SALTY 6-ATTACHED-1,'800mg'$  daily  PLEASE INCREASE PHYSICAL ACTIVITY AS TOLERATED   Follow-Up: Your next appointment:  6 month(s) In Person with Quay Burow, MD   Please call our office 2 months in advance to schedule this appointment  :1  At [facility], you and your health needs are our priority.  As part of our continuing mission to provide you with exceptional heart care, we have created designated Provider Care Teams.  These Care Teams include your primary Cardiologist (physician) and Advanced Practice Providers (APPs -  Physician Assistants and Nurse Practitioners) who all work together to provide you with the care you need, when you need it.  Important Information About Sugar             6 SALTY THINGS TO AVOID     1,'800MG'$  DAILY

## 2022-05-07 LAB — BASIC METABOLIC PANEL
BUN/Creatinine Ratio: 21 (ref 10–24)
BUN: 22 mg/dL (ref 8–27)
CO2: 21 mmol/L (ref 20–29)
Calcium: 9.5 mg/dL (ref 8.6–10.2)
Chloride: 105 mmol/L (ref 96–106)
Creatinine, Ser: 1.06 mg/dL (ref 0.76–1.27)
Glucose: 94 mg/dL (ref 70–99)
Potassium: 4.2 mmol/L (ref 3.5–5.2)
Sodium: 143 mmol/L (ref 134–144)
eGFR: 74 mL/min/{1.73_m2} (ref >59)

## 2022-05-15 ENCOUNTER — Encounter: Payer: Self-pay | Admitting: Pharmacist

## 2022-05-15 DIAGNOSIS — Z9861 Coronary angioplasty status: Secondary | ICD-10-CM

## 2022-05-15 DIAGNOSIS — E785 Hyperlipidemia, unspecified: Secondary | ICD-10-CM

## 2022-05-15 MED ORDER — REPATHA SURECLICK 140 MG/ML ~~LOC~~ SOAJ: 1.0000 mL | SUBCUTANEOUS | 3 refills | Status: DC

## 2022-05-15 NOTE — Addendum Note (Signed)
Addended by: Rollen Sox on: 05/15/2022 09:24 AM   Modules accepted: Orders

## 2022-05-23 ENCOUNTER — Telehealth: Payer: Self-pay

## 2022-05-23 DIAGNOSIS — Z789 Other specified health status: Secondary | ICD-10-CM

## 2022-05-23 NOTE — Progress Notes (Signed)
[facility])                                            [place] Team                                        Statin Quality Measure Assessment    05/23/2022  Allen [name] [birth year] [id number]  Per review of chart and payor information, this patient has been flagged for non-adherence to the following CMS Quality Measure:   '[]'$  Statin Use in Persons with Diabetes  '[x]'$  Statin Use in Persons with Cardiovascular Disease  The ASCVD Risk score (Arnett DK, et al., 2019) failed to calculate for the following reasons:   The patient has a prior MI or stroke diagnosis  Allen [name] is failing [facility] CMS measure as a result of not filling rosuvastatin in 2023. I called and spoke with patient today, he reported that at one point he stopped taking rosuvastatin d/t muscle pain he endorsed while on the medication. However, he has recently resumed taking rosuvastatin daily after receiving education on medication compliance and clinical benefits of statin.  Rosuvastatin 20 mg was increased to 40 mg and patient denied worsening symptoms with dose increase.  He has not picked up Repatha since he is at the beach right now, but plans to fill medication when he returns home. Of note, Allen [name] was under the impression that once he starts Repatha he would d/c rosuvastatin. Per chart review, I do not see directions to d/c rosuvastatin. Encouraged patient to continue rosuvastatin, if pain is not an issue, for now and this pharmacist will f/u with provider(s).    No appointment scheduled with PCP or [facility]. Please consider associating exclusion (see options below) at the next encounter if rosuvastatin will be discontinued.   Please consider ONE of the following recommendations:   Initiate high intensity statin Atorvastatin '40mg'$  once daily, #90, 3 refills   Rosuvastatin '20mg'$  once daily, #90, 3 refills    Initiate moderate intensity           statin with reduced frequency if prior          statin intolerance 1x weekly, #13, 3 refills   2x weekly, #26, 3 refills   3x weekly, #39, 3 refills   Code for past statin intolerance or other exclusions (required annually)  Drug Induced Myopathy G72.0   Myositis, unspecified M60.9   Rhabdomyolysis M62.82   Cirrhosis of liver K74.69   Biliary cirrhosis, unspecified K74.5   Abnormal blood glucose - for SUPD ONLY R73.09   Prediabetes - for SUPD ONLY  R73.03   Polycystic ovarian syndrome E28.2   Adverse effect of antihyperlipidemic and antiarteriosclerotic drugs, initial encounter S06.3K1S   Thank you for your time,  Kristeen Miss, [place] Cell: [phone]

## 2022-05-26 DIAGNOSIS — J449 Chronic obstructive pulmonary disease, unspecified: Secondary | ICD-10-CM | POA: Diagnosis not present

## 2022-05-29 ENCOUNTER — Encounter (INDEPENDENT_AMBULATORY_CARE_PROVIDER_SITE_OTHER): Payer: Self-pay

## 2022-06-13 ENCOUNTER — Other Ambulatory Visit: Payer: Self-pay | Admitting: Cardiology

## 2022-06-26 DIAGNOSIS — J449 Chronic obstructive pulmonary disease, unspecified: Secondary | ICD-10-CM | POA: Diagnosis not present

## 2022-07-01 ENCOUNTER — Ambulatory Visit: Payer: Medicare Other | Admitting: Adult Health

## 2022-07-26 DIAGNOSIS — J449 Chronic obstructive pulmonary disease, unspecified: Secondary | ICD-10-CM | POA: Diagnosis not present

## 2022-08-26 DIAGNOSIS — J449 Chronic obstructive pulmonary disease, unspecified: Secondary | ICD-10-CM | POA: Diagnosis not present

## 2022-09-17 ENCOUNTER — Telehealth: Payer: Self-pay

## 2022-09-17 NOTE — Progress Notes (Signed)
[place] [facility])                                            [place] Team                                        Statin Quality Measure Assessment    09/17/2022  ALAZAR CHERIAN [birth year] [id number]  Per review of chart and payor information, this patient has been flagged for non-adherence to the following CMS Quality Measure:   '[]'$  Statin Use in Persons with Diabetes  '[x]'$  Statin Use in Persons with Cardiovascular Disease  The ASCVD Risk score (Arnett DK, et al., 2019) failed to calculate for the following reasons:   The patient has a prior MI or stroke diagnosis  Patient was outreached to discuss medication adherence as rosuvastatin has not been filled in 2023, per pharmacy claims and prior discussion with patient. This pharmacist called Mr. Grattan today to discuss medication compliance of rosuvastatin but patient reports that he stopped taking medication due to muscle aches. He is only using Repatha at this time. No appointment with cardiologist (including pharmacist in lipid clinic) or PCP at this time.   If deemed clinically appropriate, please consider associating exclusion code (see options below) at the next appointment.   Please consider ONE of the following recommendations:   Initiate high intensity statin Atorvastatin '40mg'$  once daily, #90, 3 refills   Rosuvastatin '20mg'$  once daily, #90, 3 refills    Initiate moderate intensity          statin with reduced frequency if prior          statin intolerance 1x weekly, #13, 3 refills   2x weekly, #26, 3 refills   3x weekly, #39, 3 refills   Code for past statin intolerance or other exclusions (required annually)  Drug Induced Myopathy G72.0   Myositis, unspecified M60.9   Myalgia M79.1   Rhabdomyolysis M62.82   Cirrhosis of liver K74.69   Biliary cirrhosis, unspecified K74.5   Thank you for your time,  Kristeen Miss, [place] Cell: [phone]

## 2022-09-25 DIAGNOSIS — J449 Chronic obstructive pulmonary disease, unspecified: Secondary | ICD-10-CM | POA: Diagnosis not present

## 2022-09-27 ENCOUNTER — Other Ambulatory Visit: Payer: Self-pay | Admitting: Cardiology

## 2022-09-28 ENCOUNTER — Other Ambulatory Visit: Payer: Self-pay | Admitting: Family Medicine

## 2022-10-21 ENCOUNTER — Other Ambulatory Visit: Payer: Self-pay | Admitting: Nurse Practitioner

## 2022-10-23 NOTE — Telephone Encounter (Signed)
This is Dr. Berry's pt 

## 2022-11-15 ENCOUNTER — Other Ambulatory Visit: Payer: Self-pay | Admitting: Family Medicine

## 2022-11-18 DIAGNOSIS — M5459 Other low back pain: Secondary | ICD-10-CM | POA: Diagnosis not present

## 2022-12-02 ENCOUNTER — Telehealth: Payer: Self-pay | Admitting: Family Medicine

## 2022-12-02 NOTE — Telephone Encounter (Signed)
Patient requests to transfer care from Dr. Birdie Riddle to Dr. Randol Kern. Is this okay?

## 2022-12-02 NOTE — Telephone Encounter (Signed)
[place] with me!  I haven't seen him a quite awhile but wish him the best

## 2022-12-03 ENCOUNTER — Other Ambulatory Visit: Payer: Self-pay | Admitting: Nurse Practitioner

## 2022-12-12 NOTE — Telephone Encounter (Signed)
Would this be okay with you?

## 2022-12-14 IMAGING — CT CT RENAL STONE PROTOCOL
2 of 4 series · 16 of 46 positions shown, 18 images · non-contrast
Comparison: CT of the chest abdomen pelvis dated 04/26/2014.

CLINICAL DATA: 74-year-old male with flank pain. Concern for kidney
stone.

EXAM:
CT ABDOMEN AND PELVIS WITHOUT CONTRAST
TECHNIQUE: Multidetector CT imaging of the abdomen and pelvis was performed
following the standard protocol without IV contrast.

[Series 3: renal stone 5.0 · axial · 0.96mm/px · z∈[+783,+1258]mm · 13 of 105 slices shown, 15 images]
[im 5/105  soft-tissue]
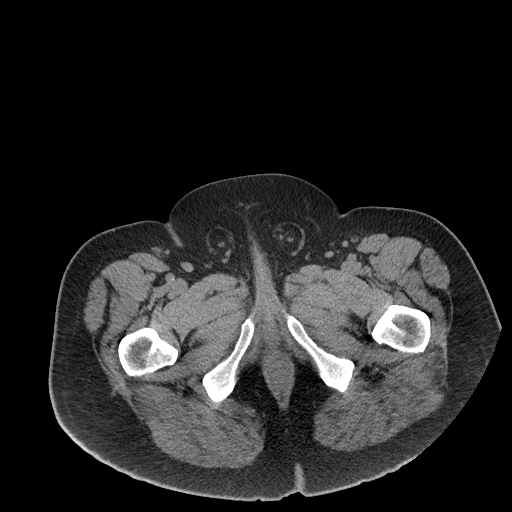
[im 5/105  bone]
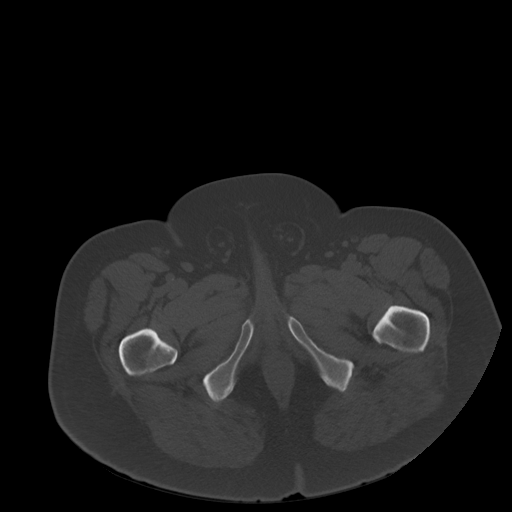
[im 15/105  soft-tissue]
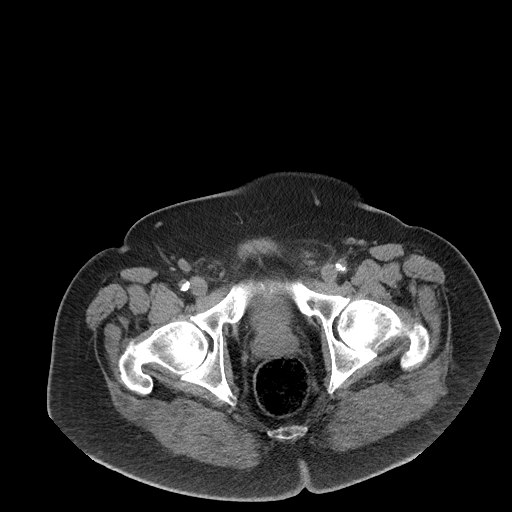
[im 24/105  soft-tissue]
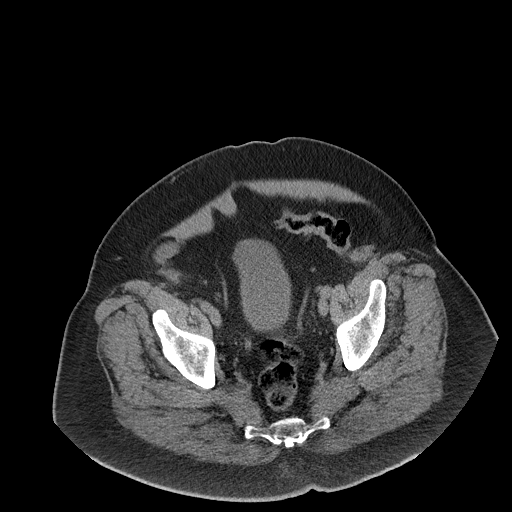
[im 29/105  soft-tissue]
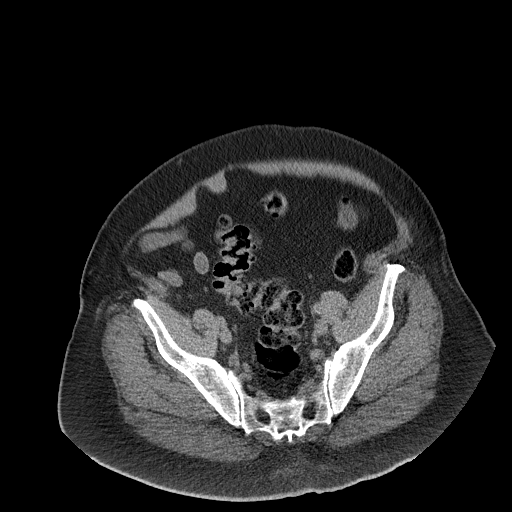
[im 38/105  soft-tissue]
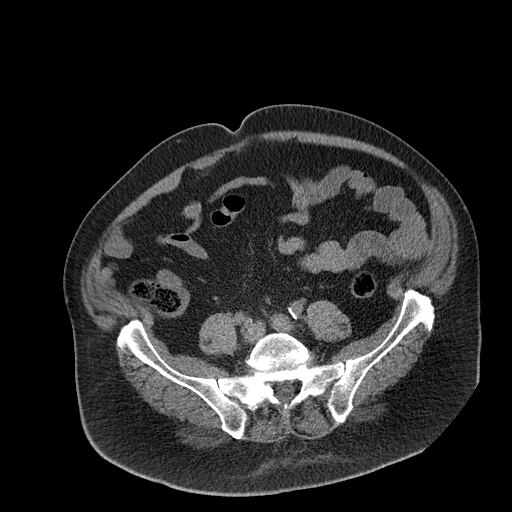
[im 43/105  soft-tissue]
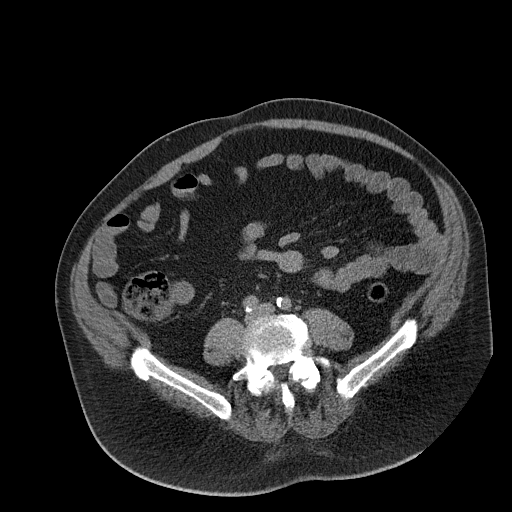
[im 53/105  soft-tissue]
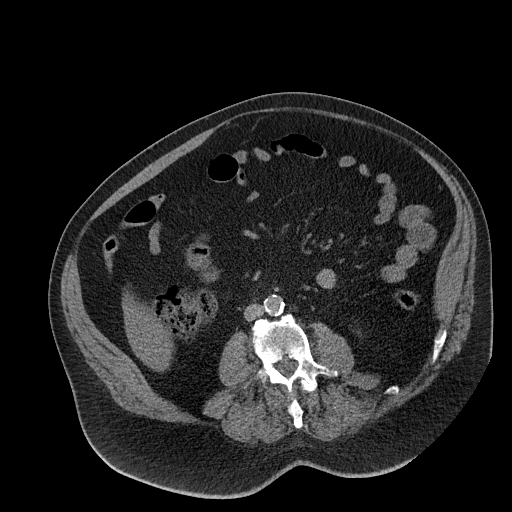
[im 62/105  soft-tissue]
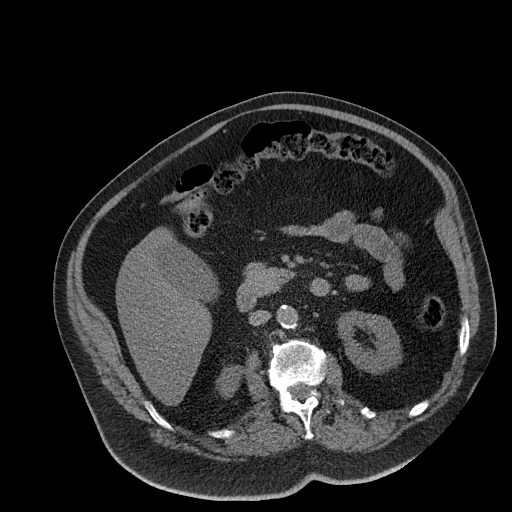
[im 67/105  soft-tissue]
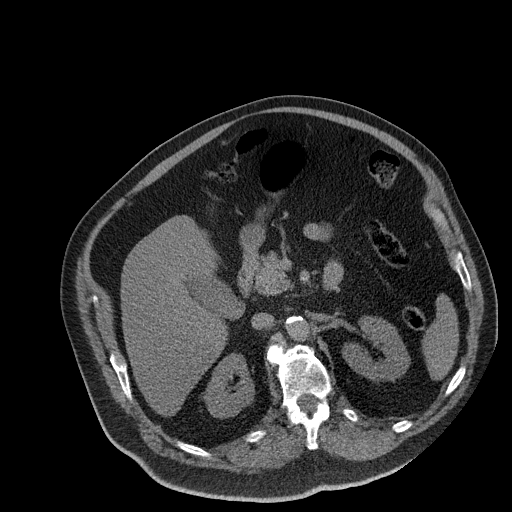
[im 67/105  bone]
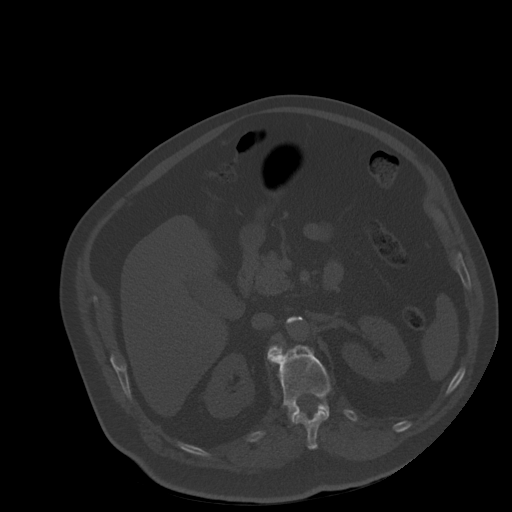
[im 76/105  soft-tissue]
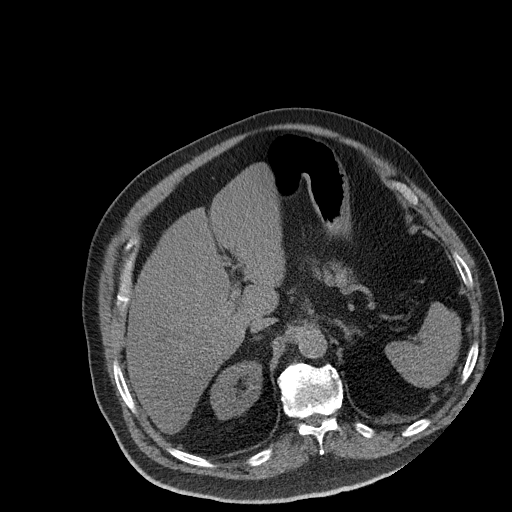
[im 81/105  soft-tissue]
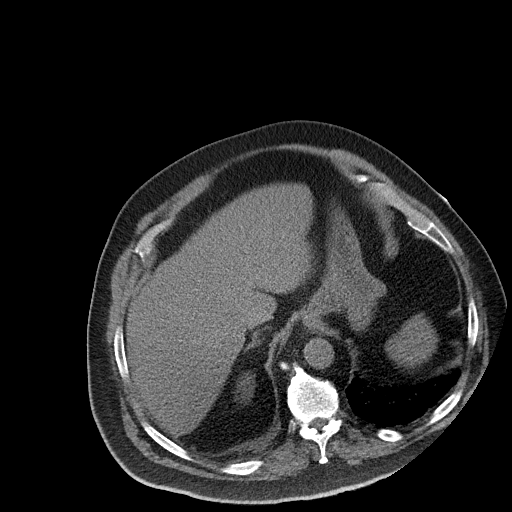
[im 90/105  soft-tissue]
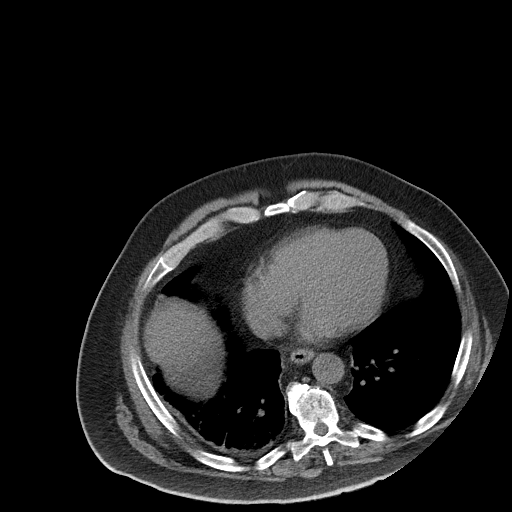
[im 100/105  soft-tissue]
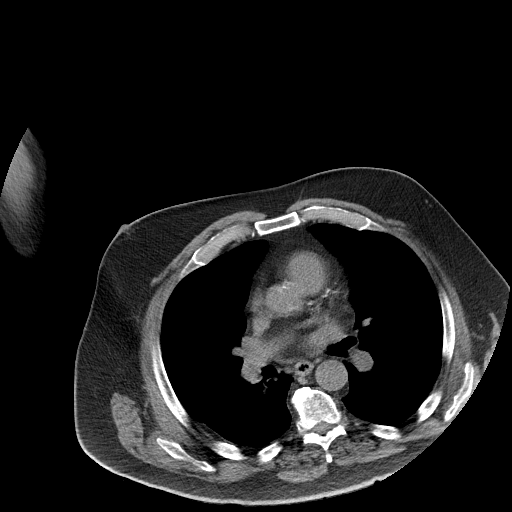

[Series 6: cor · coronal · 1.02mm/px · 3 of 214 slices shown]
[im 72/214  soft-tissue]
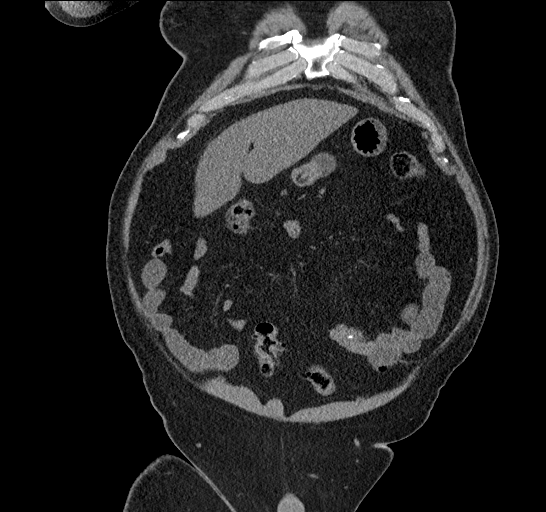
[im 95/214  soft-tissue]
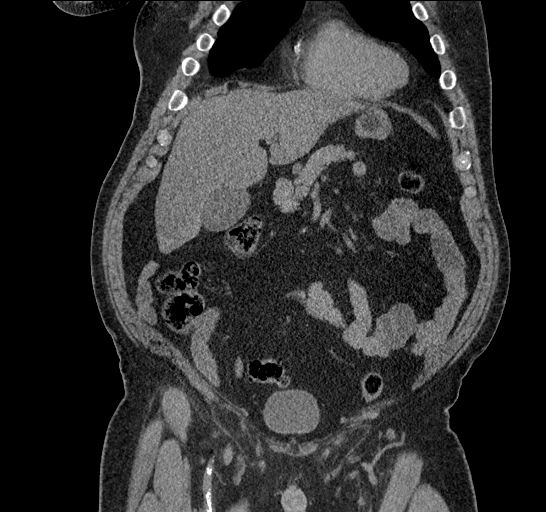
[im 119/214  soft-tissue]
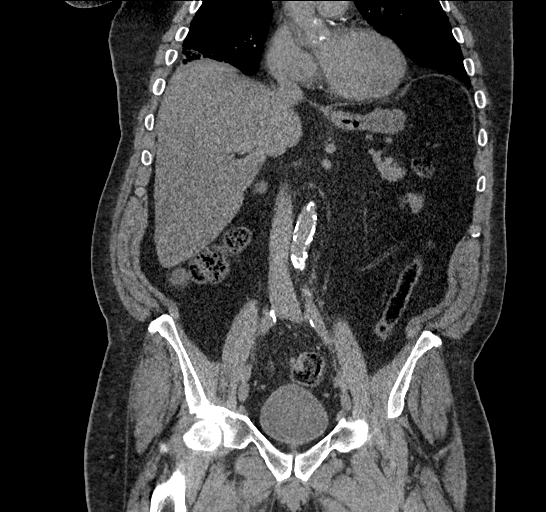

[16 of 46 positions shown; findings below may reference images not displayed]

FINDINGS: Evaluation of this exam is limited in the absence of intravenous
contrast.

Lower chest: There are bibasilar subpleural densities, right greater
left which may represent atelectasis or infiltrate. A subpleural
hazy density in the right middle lobe may also represent atelectasis
or infiltrate, although a pulmonary infarct is not excluded. Chest
CT with IV contrast may provide better evaluation if there is high
clinical concern for pulmonary embolism. Three vessel coronary
vascular calcification.

No intra-abdominal free air or free fluid.

Hepatobiliary: Diffuse fatty liver. No intrahepatic biliary
dilatation. The gallbladder is unremarkable.

Pancreas: Unremarkable. No pancreatic ductal dilatation or
surrounding inflammatory changes.

Spleen: Normal in size without focal abnormality.

Adrenals/Urinary Tract: The adrenal glands are unremarkable. The
kidneys, visualized ureters, and urinary bladder appear
unremarkable.

Stomach/Bowel: There is sigmoid diverticulosis without active
inflammatory changes. There is no bowel obstruction or active
inflammation. The appendix is normal.

Vascular/Lymphatic: Advanced aortoiliac atherosclerotic disease. The
IVC is unremarkable. No portal venous gas. There is no adenopathy.

Reproductive: The prostate and seminal vesicles are grossly
unremarkable. No pelvic mass.

Other: Small fat containing bilateral inguinal hernias.

Musculoskeletal: Degenerative changes of the spine. No acute osseous
pathology.
IMPRESSION: 1. No acute intra-abdominal or pelvic pathology. No hydronephrosis
or nephrolithiasis.
2. Fatty liver.
3. Sigmoid diverticulosis. No bowel obstruction. Normal appendix.
4. Aortic Atherosclerosis ([id number]).

## 2022-12-14 IMAGING — CR DG CHEST 2V
2 series · 2 of 2 positions shown · non-contrast
Comparison: 12/26/2019

CLINICAL DATA: Right flank pain radiating to right chest

EXAM:
CHEST - 2 VIEW

[chest ap]
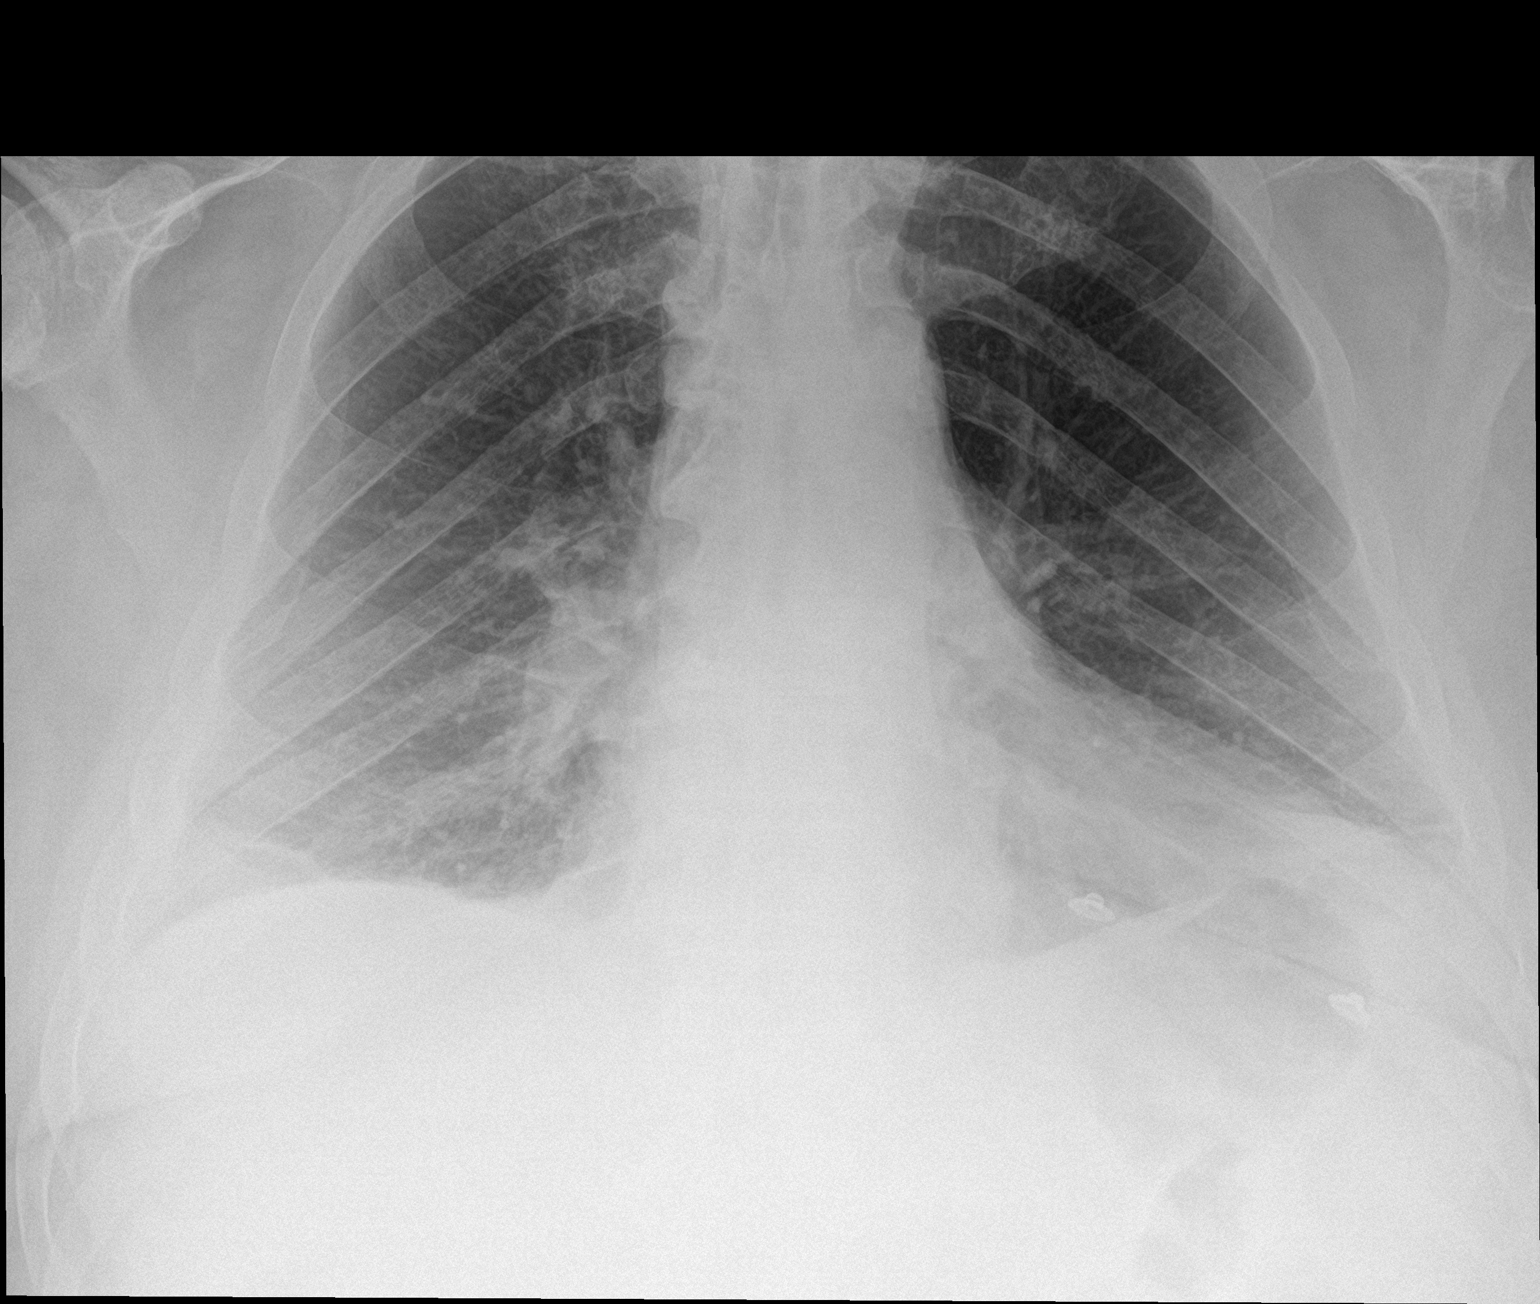

[chest lat]
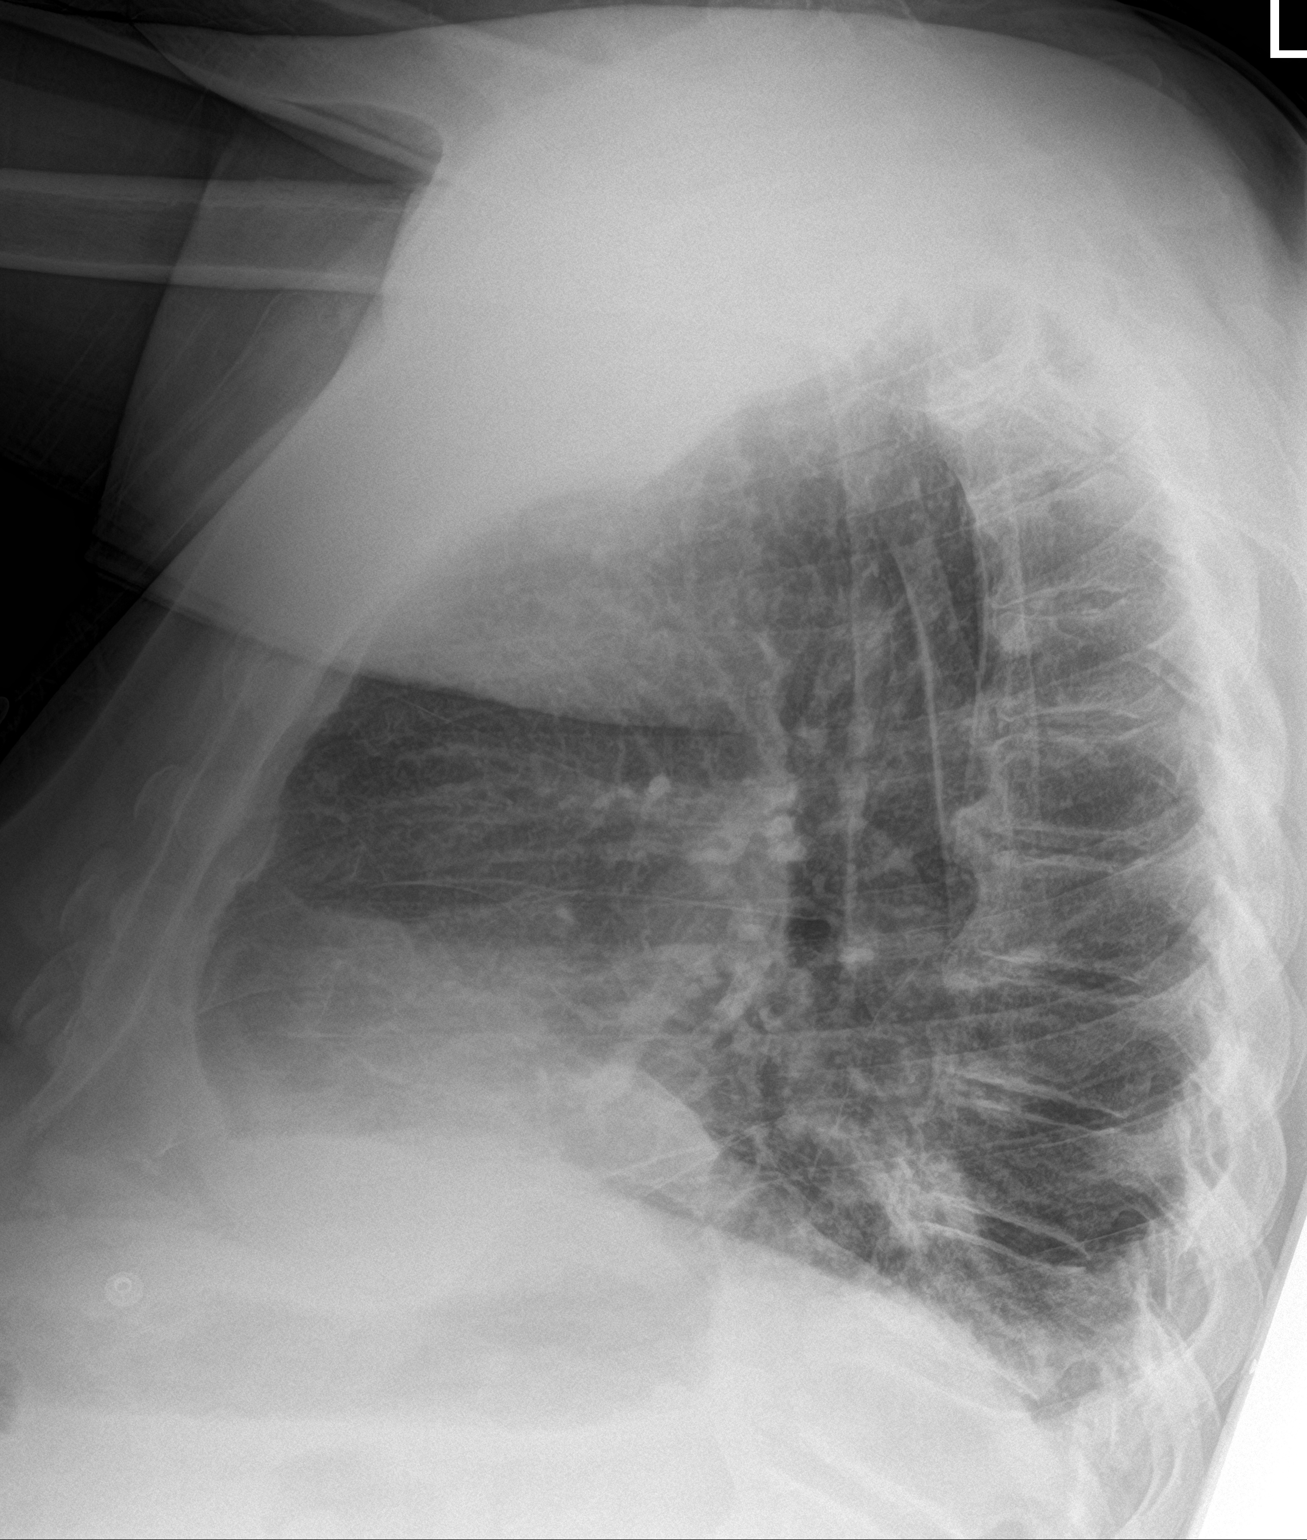

[2 of 2 positions shown; findings below may reference images not displayed]

FINDINGS: Frontal and lateral views of the chest demonstrate a stable cardiac
silhouette. There is patchy bibasilar consolidation, with a small
right pleural effusion. No pneumothorax. No acute bony
abnormalities.
IMPRESSION: 1. Patchy bibasilar consolidation compatible with atelectasis.
2. Trace right pleural effusion.

## 2022-12-15 IMAGING — US US ABDOMEN LIMITED
1 series · 14 of 25 positions shown · non-contrast
Comparison: CT from yesterday

CLINICAL DATA: Right upper quadrant pain

EXAM:
ULTRASOUND ABDOMEN LIMITED RIGHT UPPER QUADRANT

[Series 1: us abdomen limited ruq (liver/gb) · 14 of 38 slices shown]
[im 1/38]
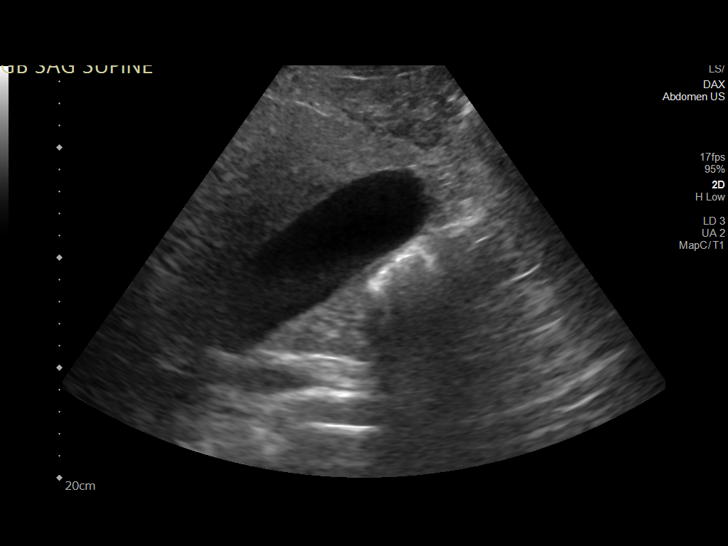
[im 4/38]
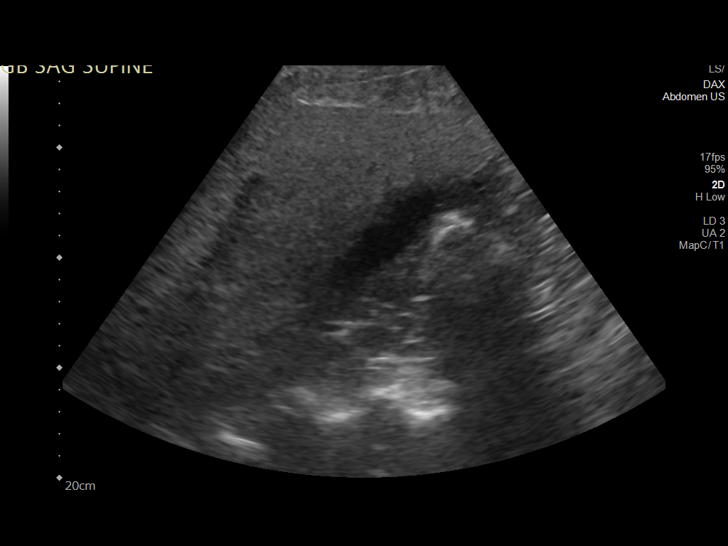
[im 7/38]
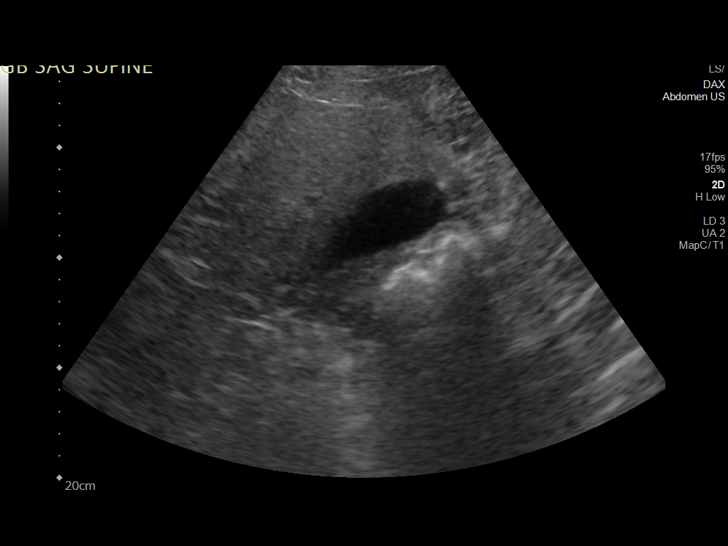
[im 10/38]
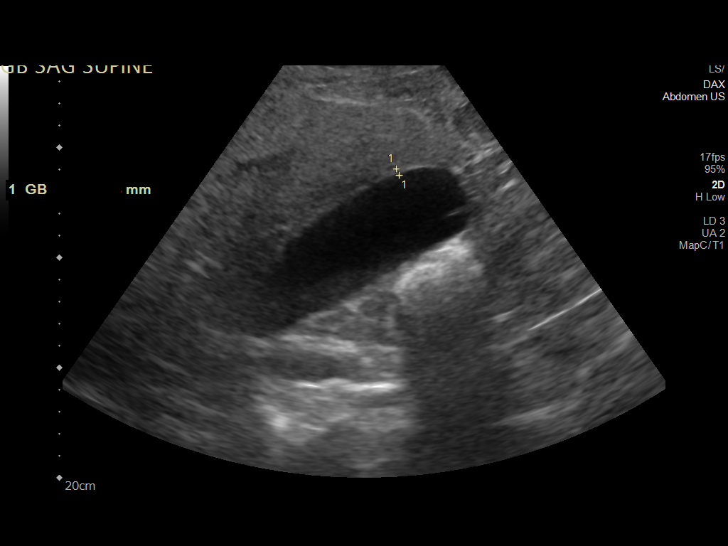
[im 13/38]
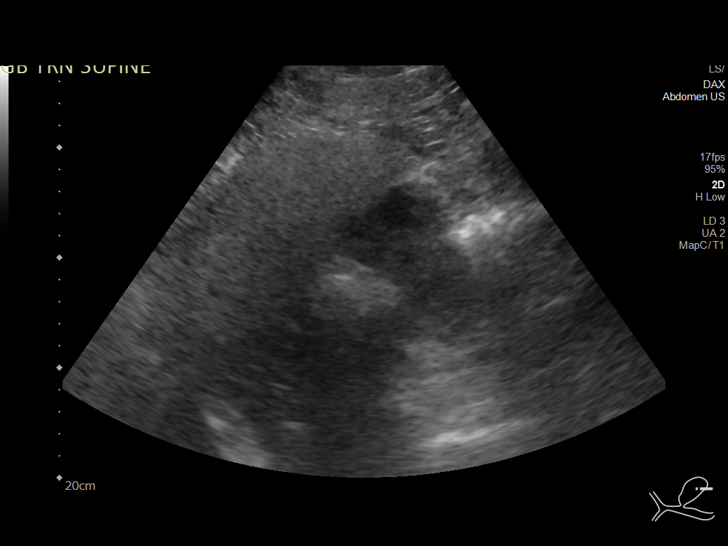
[im 14/38]
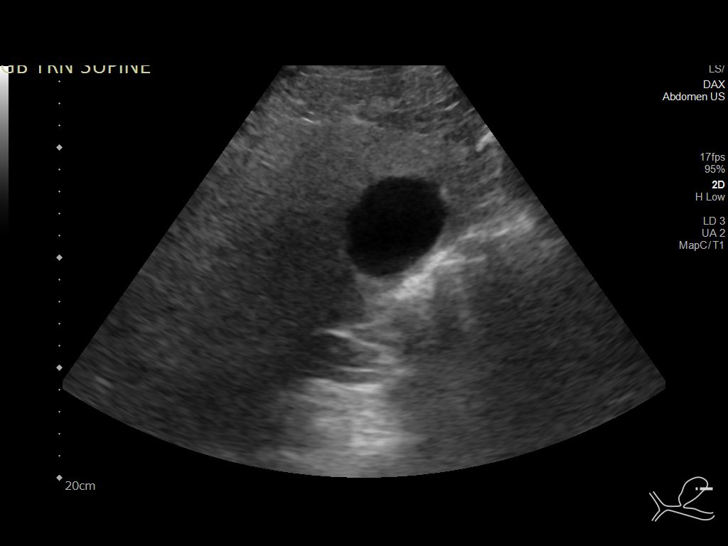
[im 17/38]
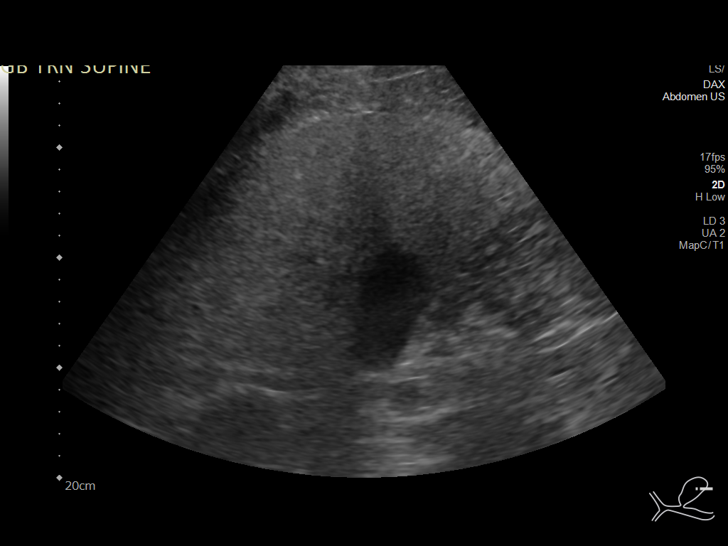
[im 21/38]
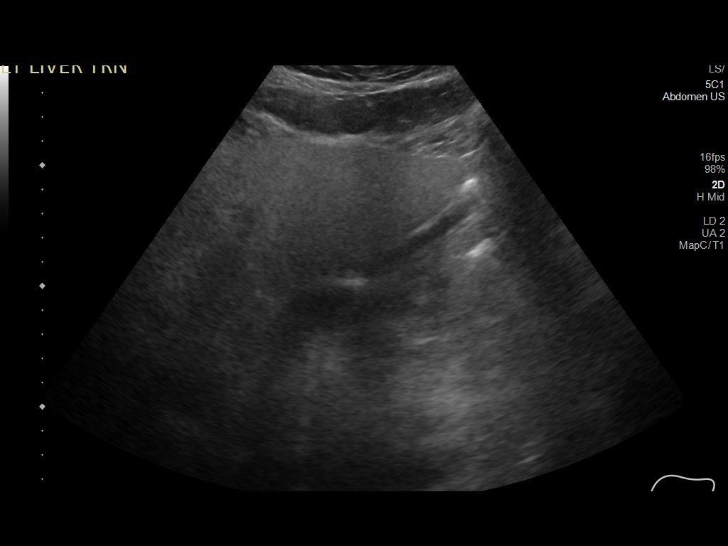
[im 24/38]
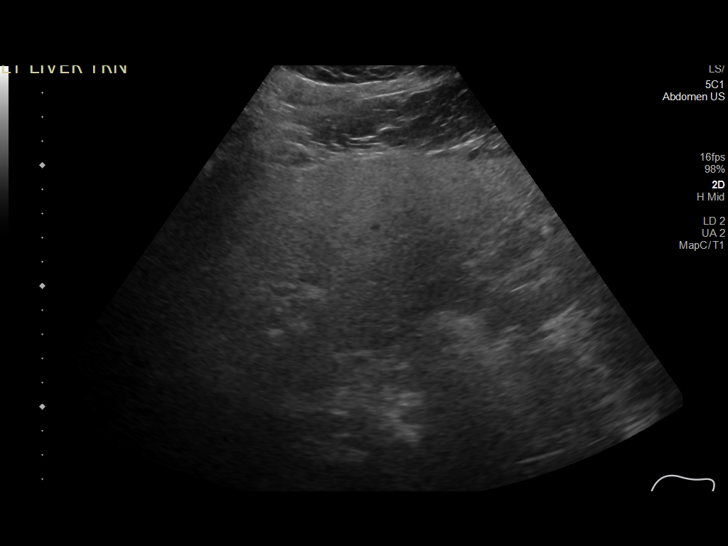
[im 25/38]
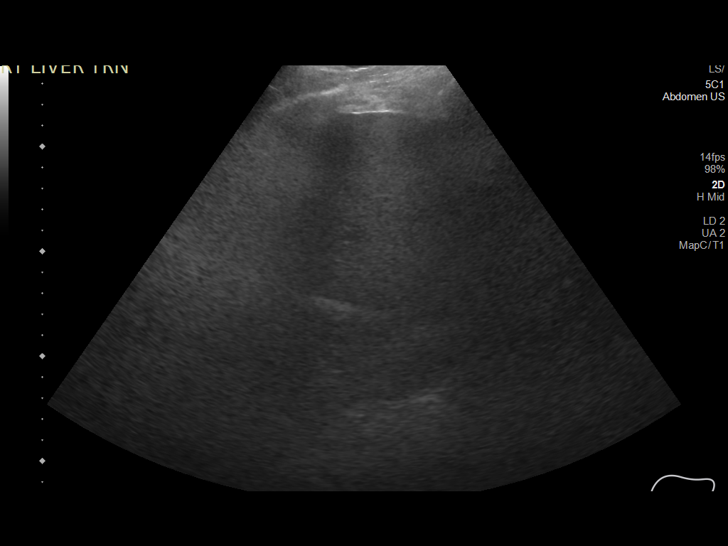
[im 28/38]
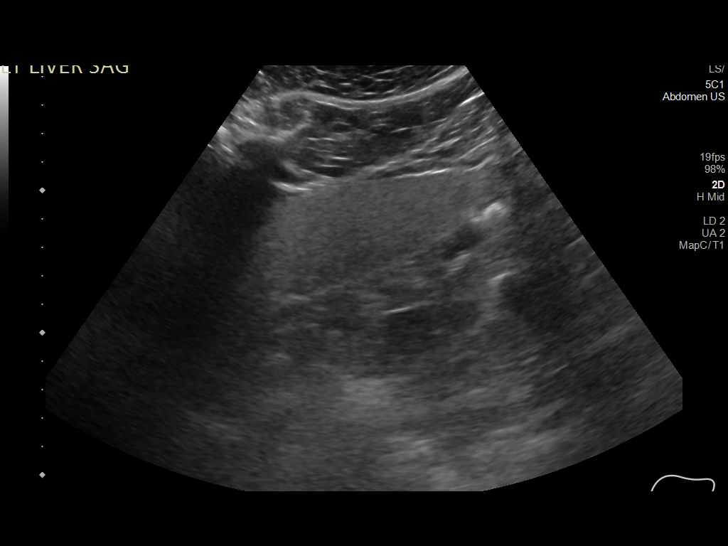
[im 31/38]
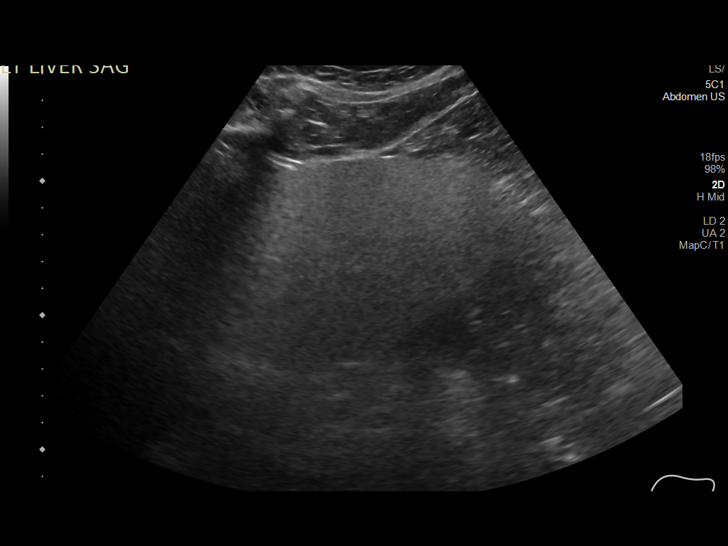
[im 34/38]
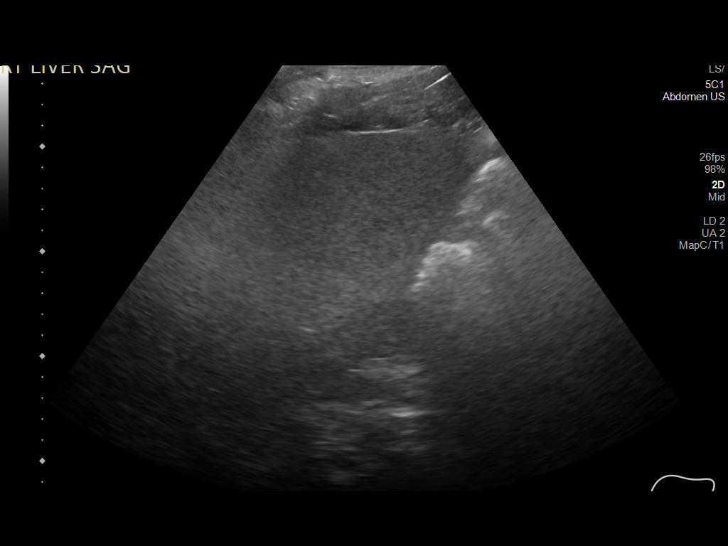
[im 38/38]
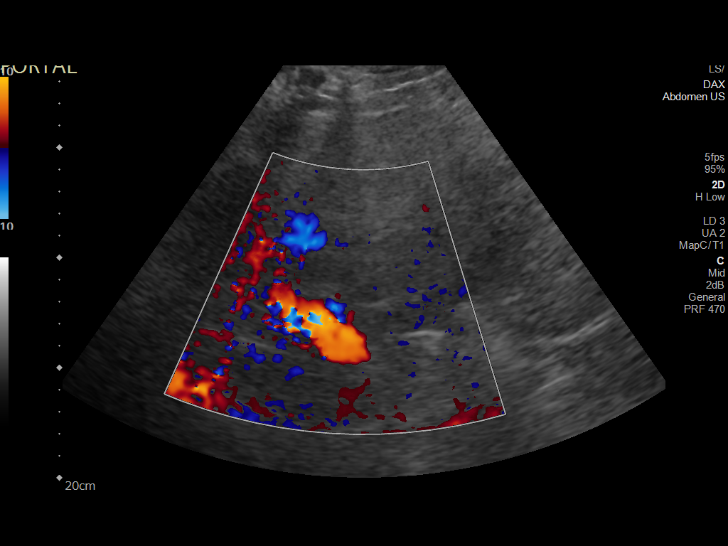

[14 of 25 positions shown; findings below may reference images not displayed]

FINDINGS: Gallbladder:

Gallbladder sludge. No discrete or shadowing stone. No focal
tenderness or wall thickening

Common bile duct:

Diameter: 6 mm

Liver:

Echogenic with diminished acoustic penetration. There is
pericholecystic sparing by prior CT. No evidence of mass. Portal
vein is patent on color Doppler imaging with normal direction of
blood flow towards the liver.

Other: None.
IMPRESSION: Gallbladder sludge and hepatic steatosis.  No acute finding.

## 2022-12-15 IMAGING — CT CT ANGIO CHEST
2 of 7 series · 18 of 46 positions shown · IV contrast (APPLIED)
Comparison: None.

CLINICAL DATA: Pulmonary embolism suspected. Low to intermediate
probability.

EXAM:
CT ANGIOGRAPHY CHEST WITH CONTRAST
TECHNIQUE: Multidetector CT imaging of the chest was performed using the
standard protocol during bolus administration of intravenous
contrast. Multiplanar CT image reconstructions and MIPs were
obtained to evaluate the vascular anatomy.
CONTRAST:  100mL OMNIPAQUE IOHEXOL 350 MG/ML SOLN

[Series 7: thins · axial · 0.91mm/px · z∈[-262,+5]mm · 15 of 430 slices shown]
[im 24/430  lung]
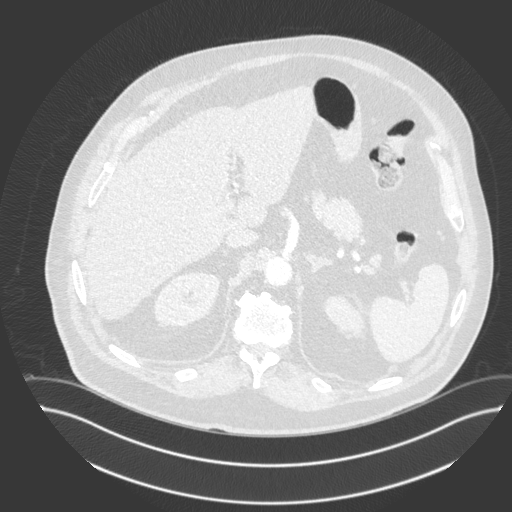
[im 48/430  soft-tissue]
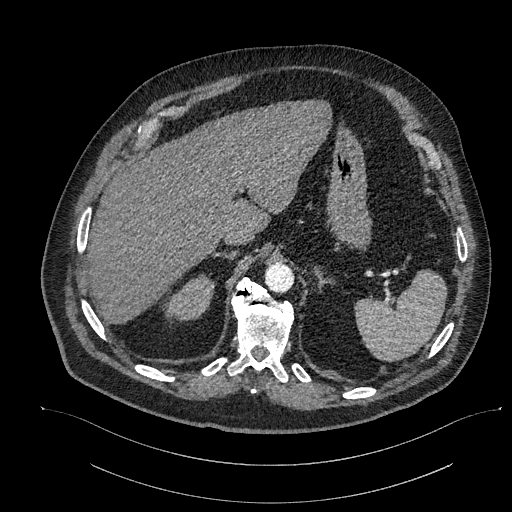
[im 72/430  lung]
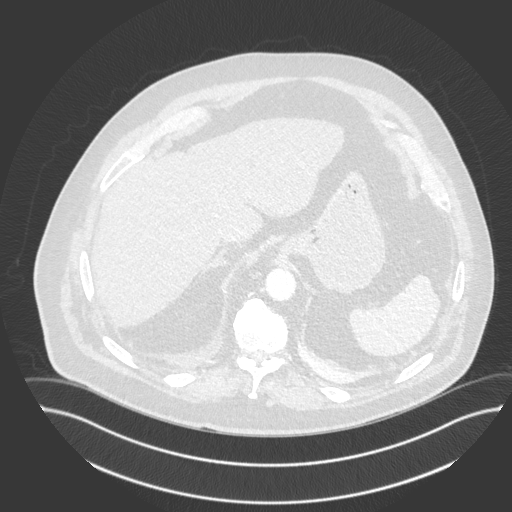
[im 96/430  soft-tissue]
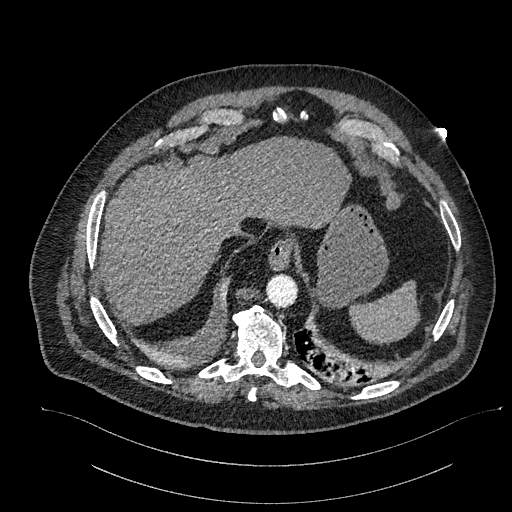
[im 144/430  lung]
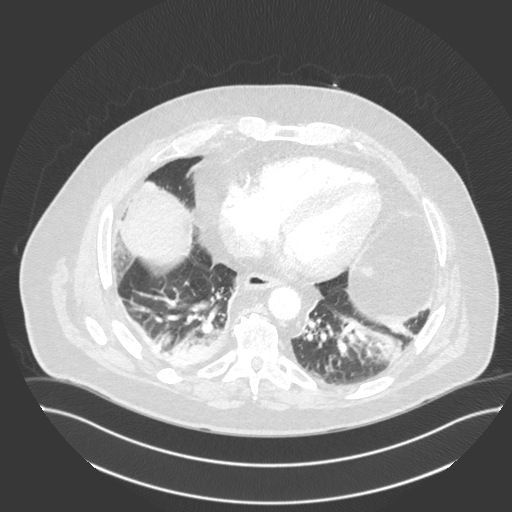
[im 167/430  soft-tissue]
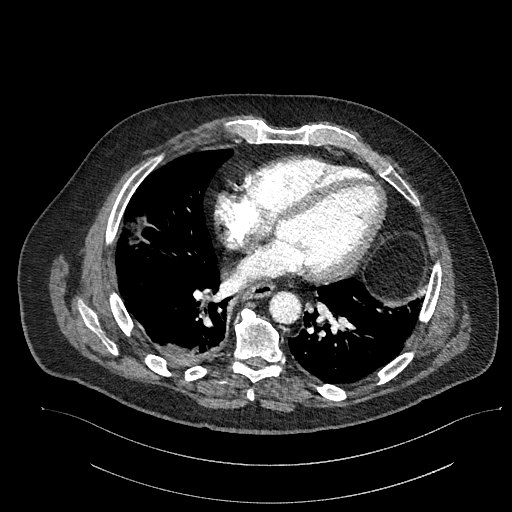
[im 191/430  lung]
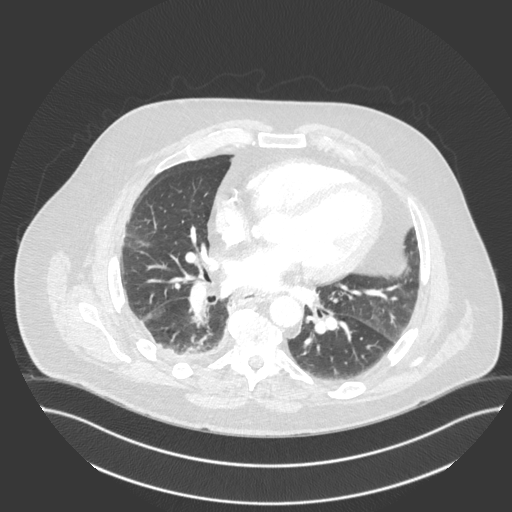
[im 215/430  soft-tissue]
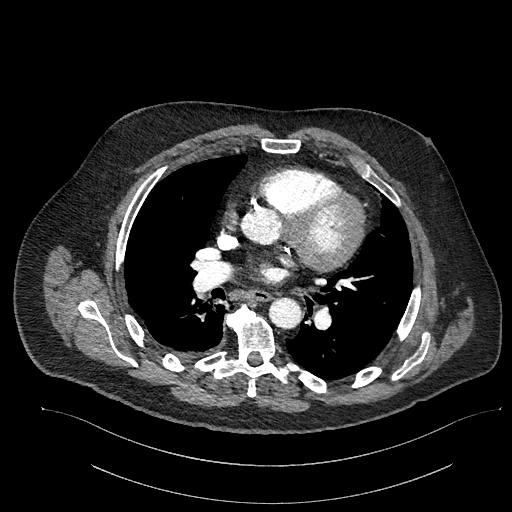
[im 239/430  lung]
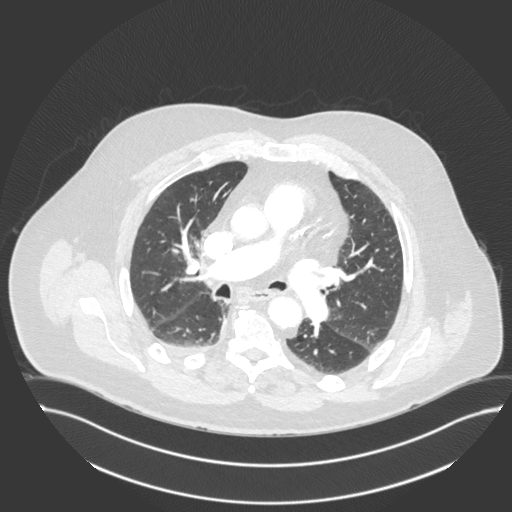
[im 263/430  soft-tissue]
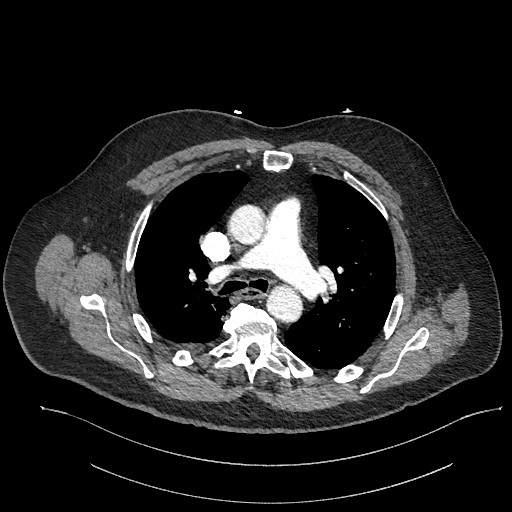
[im 287/430  lung]
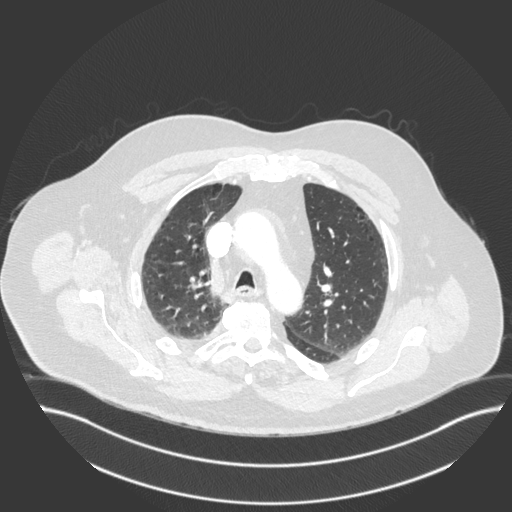
[im 334/430  soft-tissue]
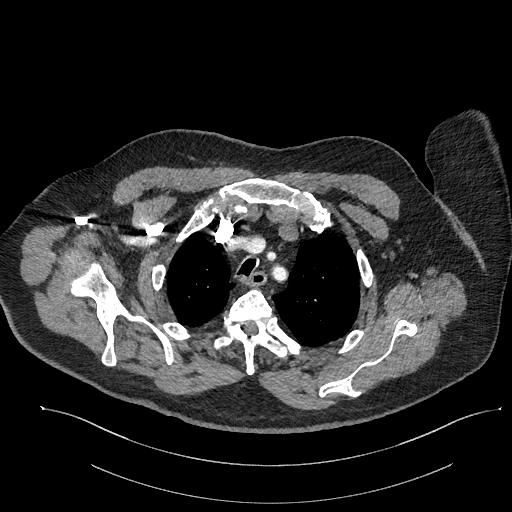
[im 358/430  lung]
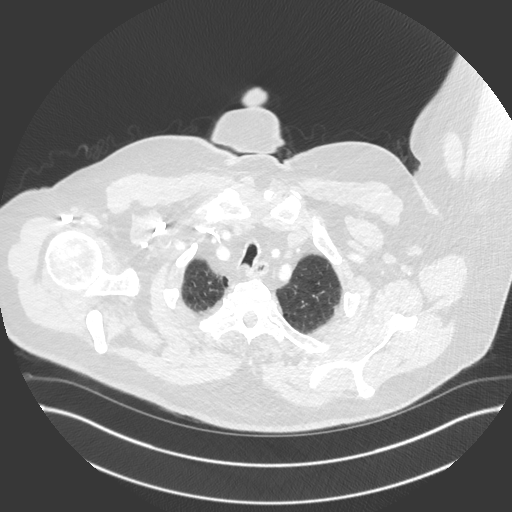
[im 382/430  soft-tissue]
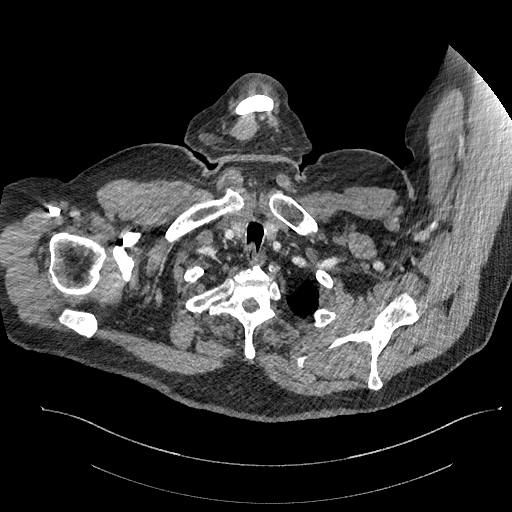
[im 406/430  lung]
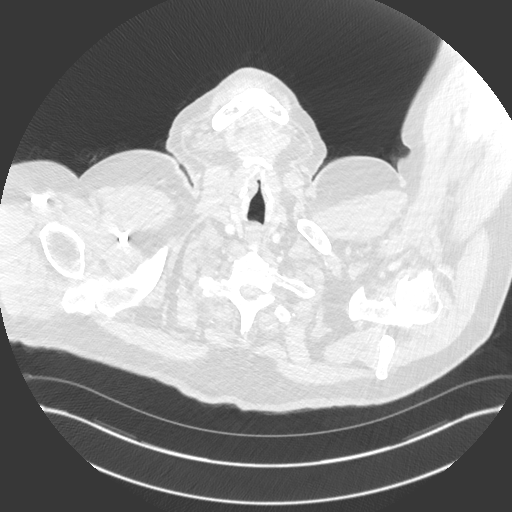

[Series 8: cor · coronal · 0.58mm/px · 3 of 185 slices shown]
[im 47/185  soft-tissue]
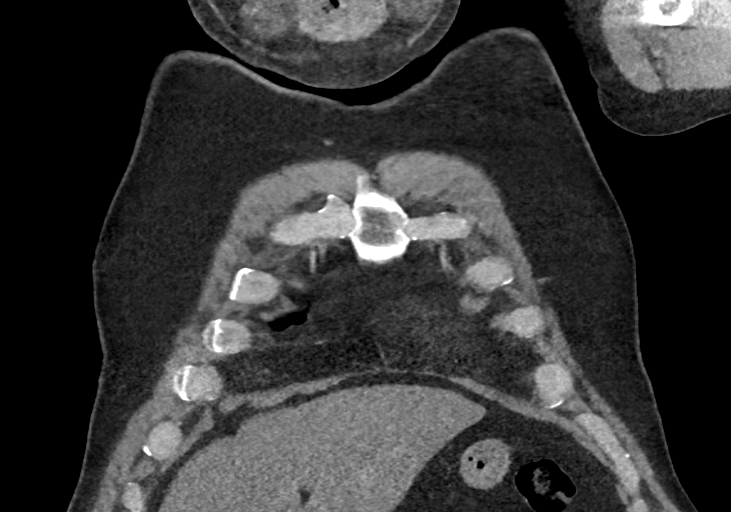
[im 93/185  soft-tissue]
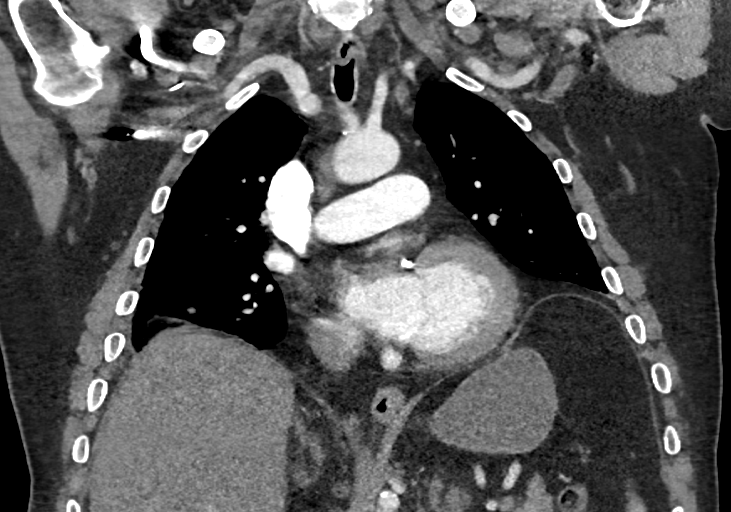
[im 139/185  soft-tissue]
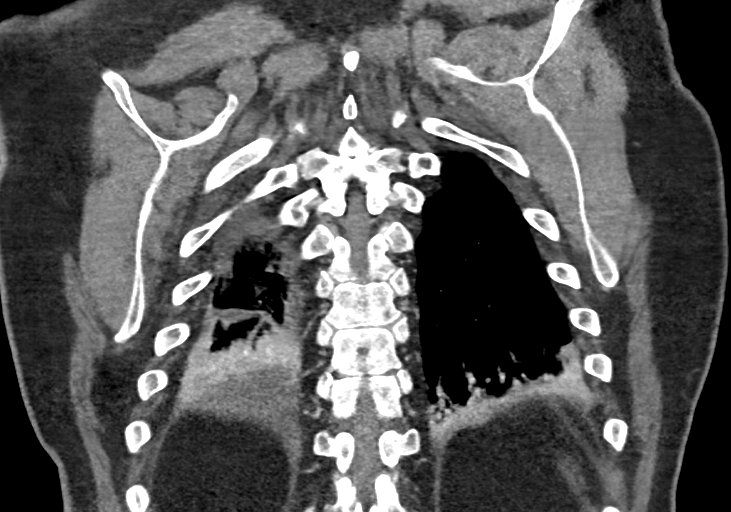

[18 of 46 positions shown; findings below may reference images not displayed]

FINDINGS: Cardiovascular: Satisfactory opacification of the pulmonary arteries
to the segmental level. Segmental to subsegmental pulmonary artery
filling defects are seen in the lateral segment right middle lobe
and posterior segment left upper lobe normal heart size. No
pericardial effusion. Extensive coronary atheromatous calcification.

Mediastinum/Nodes: No adenopathy or mass.

Lungs/Pleura: Small wedge of ground-glass opacity in the subpleural
right middle lobe consistent with infarct. No pulmonary edema.
Atelectasis at the lung bases with trace pleural fluid on the right.
Paraseptal emphysema at the apices.

Upper Abdomen: No acute finding.

Musculoskeletal: Spondylosis with multi-level bridging osteophyte.

Review of the MIP images confirms the above findings.

Critical Value/emergent results were called by telephone at the time
of interpretation on 05/24/2021 at [DATE] to provider YUDI TONE
, who verbally acknowledged these results.
IMPRESSION: 1. Acute bilateral segmental to subsubmental pulmonary emboli with
small right middle lobe infarct.
2. Extensive coronary atherosclerosis.

## 2022-12-18 ENCOUNTER — Ambulatory Visit: Payer: Medicare Other | Admitting: Internal Medicine

## 2022-12-30 ENCOUNTER — Ambulatory Visit: Payer: Medicare Other | Admitting: Internal Medicine

## 2022-12-30 ENCOUNTER — Encounter: Payer: Self-pay | Admitting: Internal Medicine

## 2022-12-30 VITALS — BP 140/78 | HR 68 | Temp 97.7°F | Ht 72.0 in | Wt 281.4 lb

## 2022-12-30 DIAGNOSIS — I2510 Atherosclerotic heart disease of native coronary artery without angina pectoris: Secondary | ICD-10-CM | POA: Diagnosis not present

## 2022-12-30 DIAGNOSIS — B351 Tinea unguium: Secondary | ICD-10-CM

## 2022-12-30 DIAGNOSIS — E785 Hyperlipidemia, unspecified: Secondary | ICD-10-CM | POA: Diagnosis not present

## 2022-12-30 DIAGNOSIS — I999 Unspecified disorder of circulatory system: Secondary | ICD-10-CM

## 2022-12-30 DIAGNOSIS — R200 Anesthesia of skin: Secondary | ICD-10-CM

## 2022-12-30 DIAGNOSIS — M542 Cervicalgia: Secondary | ICD-10-CM

## 2022-12-30 DIAGNOSIS — I2583 Coronary atherosclerosis due to lipid rich plaque: Secondary | ICD-10-CM

## 2022-12-30 DIAGNOSIS — F32A Depression, unspecified: Secondary | ICD-10-CM

## 2022-12-30 DIAGNOSIS — J4540 Moderate persistent asthma, uncomplicated: Secondary | ICD-10-CM

## 2022-12-30 DIAGNOSIS — H1013 Acute atopic conjunctivitis, bilateral: Secondary | ICD-10-CM | POA: Insufficient documentation

## 2022-12-30 DIAGNOSIS — Z9861 Coronary angioplasty status: Secondary | ICD-10-CM

## 2022-12-30 DIAGNOSIS — J45909 Unspecified asthma, uncomplicated: Secondary | ICD-10-CM | POA: Insufficient documentation

## 2022-12-30 DIAGNOSIS — R454 Irritability and anger: Secondary | ICD-10-CM

## 2022-12-30 DIAGNOSIS — S39012D Strain of muscle, fascia and tendon of lower back, subsequent encounter: Secondary | ICD-10-CM

## 2022-12-30 DIAGNOSIS — G4733 Obstructive sleep apnea (adult) (pediatric): Secondary | ICD-10-CM

## 2022-12-30 DIAGNOSIS — K219 Gastro-esophageal reflux disease without esophagitis: Secondary | ICD-10-CM

## 2022-12-30 DIAGNOSIS — Z125 Encounter for screening for malignant neoplasm of prostate: Secondary | ICD-10-CM

## 2022-12-30 DIAGNOSIS — I209 Angina pectoris, unspecified: Secondary | ICD-10-CM

## 2022-12-30 DIAGNOSIS — X503XXA Overexertion from repetitive movements, initial encounter: Secondary | ICD-10-CM | POA: Insufficient documentation

## 2022-12-30 DIAGNOSIS — R7303 Prediabetes: Secondary | ICD-10-CM

## 2022-12-30 DIAGNOSIS — X503XXD Overexertion from repetitive movements, subsequent encounter: Secondary | ICD-10-CM

## 2022-12-30 DIAGNOSIS — M109 Gout, unspecified: Secondary | ICD-10-CM

## 2022-12-30 DIAGNOSIS — M170 Bilateral primary osteoarthritis of knee: Secondary | ICD-10-CM | POA: Diagnosis not present

## 2022-12-30 DIAGNOSIS — G8929 Other chronic pain: Secondary | ICD-10-CM

## 2022-12-30 DIAGNOSIS — Z7409 Other reduced mobility: Secondary | ICD-10-CM

## 2022-12-30 DIAGNOSIS — I10 Essential (primary) hypertension: Secondary | ICD-10-CM

## 2022-12-30 DIAGNOSIS — K3189 Other diseases of stomach and duodenum: Secondary | ICD-10-CM

## 2022-12-30 MED ORDER — ISOSORBIDE MONONITRATE ER 30 MG PO TB24: 30.0000 mg | ORAL_TABLET | Freq: Every day | ORAL | 11 refills | Status: DC

## 2022-12-30 MED ORDER — OLOPATADINE HCL 0.2 % OP SOLN: OPHTHALMIC | 0 refills | Status: DC

## 2022-12-30 MED ORDER — FLUOXETINE HCL 20 MG PO TABS: 20.0000 mg | ORAL_TABLET | Freq: Every day | ORAL | 3 refills | Status: DC

## 2022-12-30 MED ORDER — OZEMPIC (0.25 OR 0.5 MG/DOSE) 2 MG/3ML ~~LOC~~ SOPN: 0.2500 mg | PEN_INJECTOR | SUBCUTANEOUS | 11 refills | Status: DC

## 2022-12-30 MED ORDER — METFORMIN HCL 500 MG PO TABS: 500.0000 mg | ORAL_TABLET | Freq: Two times a day (BID) | ORAL | 3 refills | Status: DC

## 2022-12-30 NOTE — Assessment & Plan Note (Signed)
Offered vascular referral - but we will wait and check ankle-brachial index first to decide vein or vascular.. or both
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
## 2022-12-30 NOTE — Assessment & Plan Note (Signed)
Arranged home health  physical therapy occupational therapy

## 2022-12-30 NOTE — Assessment & Plan Note (Signed)
Advised patient to try hinge joint knee brace and physical therapy if surgery not option I can do injections but bone on bone will make this of low value - will see back soon to discuss more.

## 2022-12-31 ENCOUNTER — Other Ambulatory Visit (INDEPENDENT_AMBULATORY_CARE_PROVIDER_SITE_OTHER): Payer: Medicare Other

## 2022-12-31 ENCOUNTER — Ambulatory Visit (INDEPENDENT_AMBULATORY_CARE_PROVIDER_SITE_OTHER)
Admission: RE | Admit: 2022-12-31 | Discharge: 2022-12-31 | Disposition: A | Discharge status: Home / Self Care | Payer: Medicare Other | Source: Ambulatory Visit | Attending: Internal Medicine | Admitting: Internal Medicine

## 2022-12-31 DIAGNOSIS — M109 Gout, unspecified: Secondary | ICD-10-CM | POA: Diagnosis not present

## 2022-12-31 DIAGNOSIS — X503XXD Overexertion from repetitive movements, subsequent encounter: Secondary | ICD-10-CM

## 2022-12-31 DIAGNOSIS — R200 Anesthesia of skin: Secondary | ICD-10-CM

## 2022-12-31 DIAGNOSIS — S39012D Strain of muscle, fascia and tendon of lower back, subsequent encounter: Secondary | ICD-10-CM | POA: Diagnosis not present

## 2022-12-31 DIAGNOSIS — I999 Unspecified disorder of circulatory system: Secondary | ICD-10-CM

## 2022-12-31 DIAGNOSIS — M47816 Spondylosis without myelopathy or radiculopathy, lumbar region: Secondary | ICD-10-CM | POA: Diagnosis not present

## 2022-12-31 DIAGNOSIS — M545 Low back pain, unspecified: Secondary | ICD-10-CM | POA: Diagnosis not present

## 2022-12-31 DIAGNOSIS — M4316 Spondylolisthesis, lumbar region: Secondary | ICD-10-CM | POA: Diagnosis not present

## 2022-12-31 LAB — CBC
HCT: 43.5 % (ref 39.0–52.0)
Hemoglobin: 14.6 g/dL (ref 13.0–17.0)
MCHC: 33.5 g/dL (ref 30.0–36.0)
MCV: 84.7 fl (ref 78.0–100.0)
Platelets: 260 10*3/uL (ref 150.0–400.0)
RBC: 5.14 Mil/uL (ref 4.22–5.81)
RDW: 15.6 % — ABNORMAL HIGH (ref 11.5–15.5)
WBC: 7.3 10*3/uL (ref 4.0–10.5)

## 2022-12-31 LAB — COMPREHENSIVE METABOLIC PANEL
ALT: 18 U/L (ref 0–53)
AST: 16 U/L (ref 0–37)
Albumin: 4.2 g/dL (ref 3.5–5.2)
Alkaline Phosphatase: 83 U/L (ref 39–117)
BUN: 21 mg/dL (ref 6–23)
CO2: 27 mEq/L (ref 19–32)
Calcium: 9.8 mg/dL (ref 8.4–10.5)
Chloride: 101 mEq/L (ref 96–112)
Creatinine, Ser: 0.96 mg/dL (ref 0.40–1.50)
GFR: 77.98 mL/min (ref >60.00)
Glucose, Bld: 104 mg/dL — ABNORMAL HIGH (ref 70–99)
Potassium: 4.6 mEq/L (ref 3.5–5.1)
Sodium: 138 mEq/L (ref 135–145)
Total Bilirubin: 0.4 mg/dL (ref 0.2–1.2)
Total Protein: 7.5 g/dL (ref 6.0–8.3)

## 2022-12-31 LAB — LIPID PANEL
Cholesterol: 147 mg/dL (ref 0–200)
HDL: 38.3 mg/dL — ABNORMAL LOW (ref >39.00)
NonHDL: 108.81
Total CHOL/HDL Ratio: 4
Triglycerides: 206 mg/dL — ABNORMAL HIGH (ref 0.0–149.0)
VLDL: 41.2 mg/dL — ABNORMAL HIGH (ref 0.0–40.0)

## 2022-12-31 LAB — TSH: TSH: 2.1 u[IU]/mL (ref 0.35–5.50)

## 2022-12-31 LAB — B12 AND FOLATE PANEL
Folate: 8.6 ng/mL (ref >5.9)
Vitamin B-12: 191 pg/mL — ABNORMAL LOW (ref 211–911)

## 2022-12-31 LAB — URIC ACID: Uric Acid, Serum: 6.4 mg/dL (ref 4.0–7.8)

## 2022-12-31 LAB — HEMOGLOBIN A1C: Hgb A1c MFr Bld: 6 % (ref 4.6–6.5)

## 2022-12-31 LAB — LDL CHOLESTEROL, DIRECT: Direct LDL: 89 mg/dL

## 2023-01-01 ENCOUNTER — Encounter: Payer: Self-pay | Admitting: Internal Medicine

## 2023-01-01 DIAGNOSIS — E538 Deficiency of other specified B group vitamins: Secondary | ICD-10-CM | POA: Insufficient documentation

## 2023-01-01 NOTE — Progress Notes (Signed)
Please place any orders needed / requested. Arrange/confirm follow up appt(s).

## 2023-01-02 ENCOUNTER — Other Ambulatory Visit: Payer: Self-pay

## 2023-01-02 DIAGNOSIS — R7989 Other specified abnormal findings of blood chemistry: Secondary | ICD-10-CM

## 2023-01-02 LAB — METHYLMALONIC ACID, SERUM: Methylmalonic Acid, Quant: 245 nmol/L (ref 87–318)

## 2023-01-02 MED ORDER — VITAMIN B-12 1000 MCG PO TABS: 1000.0000 ug | ORAL_TABLET | Freq: Every day | ORAL | 3 refills | Status: DC

## 2023-01-02 NOTE — Addendum Note (Signed)
Addended by: Joanette Gula on: 01/02/2023 03:48 PM   Modules accepted: Orders

## 2023-01-02 NOTE — Addendum Note (Signed)
Addended by: Joanette Gula on: 01/02/2023 03:44 PM   Modules accepted: Orders

## 2023-01-03 ENCOUNTER — Encounter: Payer: Self-pay | Admitting: Internal Medicine

## 2023-01-03 DIAGNOSIS — M545 Low back pain, unspecified: Secondary | ICD-10-CM | POA: Insufficient documentation

## 2023-01-03 DIAGNOSIS — I700 Atherosclerosis of aorta: Secondary | ICD-10-CM | POA: Insufficient documentation

## 2023-01-04 ENCOUNTER — Other Ambulatory Visit: Payer: Self-pay | Admitting: Nurse Practitioner

## 2023-01-06 DIAGNOSIS — J4489 Other specified chronic obstructive pulmonary disease: Secondary | ICD-10-CM | POA: Diagnosis not present

## 2023-01-06 DIAGNOSIS — Z7982 Long term (current) use of aspirin: Secondary | ICD-10-CM | POA: Diagnosis not present

## 2023-01-06 DIAGNOSIS — J4540 Moderate persistent asthma, uncomplicated: Secondary | ICD-10-CM | POA: Diagnosis not present

## 2023-01-06 DIAGNOSIS — E785 Hyperlipidemia, unspecified: Secondary | ICD-10-CM | POA: Diagnosis not present

## 2023-01-06 DIAGNOSIS — I2583 Coronary atherosclerosis due to lipid rich plaque: Secondary | ICD-10-CM | POA: Diagnosis not present

## 2023-01-06 DIAGNOSIS — Z9181 History of falling: Secondary | ICD-10-CM | POA: Diagnosis not present

## 2023-01-06 DIAGNOSIS — Z8673 Personal history of transient ischemic attack (TIA), and cerebral infarction without residual deficits: Secondary | ICD-10-CM | POA: Diagnosis not present

## 2023-01-06 DIAGNOSIS — Z87891 Personal history of nicotine dependence: Secondary | ICD-10-CM | POA: Diagnosis not present

## 2023-01-06 DIAGNOSIS — H1013 Acute atopic conjunctivitis, bilateral: Secondary | ICD-10-CM | POA: Diagnosis not present

## 2023-01-06 DIAGNOSIS — I10 Essential (primary) hypertension: Secondary | ICD-10-CM | POA: Diagnosis not present

## 2023-01-06 DIAGNOSIS — Z86711 Personal history of pulmonary embolism: Secondary | ICD-10-CM | POA: Diagnosis not present

## 2023-01-06 DIAGNOSIS — M19012 Primary osteoarthritis, left shoulder: Secondary | ICD-10-CM | POA: Diagnosis not present

## 2023-01-06 DIAGNOSIS — M109 Gout, unspecified: Secondary | ICD-10-CM | POA: Diagnosis not present

## 2023-01-06 DIAGNOSIS — G8929 Other chronic pain: Secondary | ICD-10-CM | POA: Diagnosis not present

## 2023-01-06 DIAGNOSIS — M545 Low back pain, unspecified: Secondary | ICD-10-CM | POA: Diagnosis not present

## 2023-01-06 DIAGNOSIS — B351 Tinea unguium: Secondary | ICD-10-CM | POA: Diagnosis not present

## 2023-01-06 DIAGNOSIS — G4733 Obstructive sleep apnea (adult) (pediatric): Secondary | ICD-10-CM | POA: Diagnosis not present

## 2023-01-06 DIAGNOSIS — Z7951 Long term (current) use of inhaled steroids: Secondary | ICD-10-CM | POA: Diagnosis not present

## 2023-01-06 DIAGNOSIS — Z7984 Long term (current) use of oral hypoglycemic drugs: Secondary | ICD-10-CM | POA: Diagnosis not present

## 2023-01-06 DIAGNOSIS — M170 Bilateral primary osteoarthritis of knee: Secondary | ICD-10-CM | POA: Diagnosis not present

## 2023-01-06 DIAGNOSIS — R7303 Prediabetes: Secondary | ICD-10-CM | POA: Diagnosis not present

## 2023-01-06 DIAGNOSIS — K449 Diaphragmatic hernia without obstruction or gangrene: Secondary | ICD-10-CM | POA: Diagnosis not present

## 2023-01-06 DIAGNOSIS — Z7901 Long term (current) use of anticoagulants: Secondary | ICD-10-CM | POA: Diagnosis not present

## 2023-01-06 DIAGNOSIS — Z6839 Body mass index (BMI) 39.0-39.9, adult: Secondary | ICD-10-CM | POA: Diagnosis not present

## 2023-01-10 DIAGNOSIS — J4540 Moderate persistent asthma, uncomplicated: Secondary | ICD-10-CM | POA: Diagnosis not present

## 2023-01-10 DIAGNOSIS — G8929 Other chronic pain: Secondary | ICD-10-CM | POA: Diagnosis not present

## 2023-01-10 DIAGNOSIS — M545 Low back pain, unspecified: Secondary | ICD-10-CM | POA: Diagnosis not present

## 2023-01-10 DIAGNOSIS — H1013 Acute atopic conjunctivitis, bilateral: Secondary | ICD-10-CM | POA: Diagnosis not present

## 2023-01-10 DIAGNOSIS — G4733 Obstructive sleep apnea (adult) (pediatric): Secondary | ICD-10-CM | POA: Diagnosis not present

## 2023-01-10 DIAGNOSIS — K449 Diaphragmatic hernia without obstruction or gangrene: Secondary | ICD-10-CM | POA: Diagnosis not present

## 2023-01-10 DIAGNOSIS — I2583 Coronary atherosclerosis due to lipid rich plaque: Secondary | ICD-10-CM | POA: Diagnosis not present

## 2023-01-10 DIAGNOSIS — R7303 Prediabetes: Secondary | ICD-10-CM | POA: Diagnosis not present

## 2023-01-10 DIAGNOSIS — J4489 Other specified chronic obstructive pulmonary disease: Secondary | ICD-10-CM | POA: Diagnosis not present

## 2023-01-10 DIAGNOSIS — M170 Bilateral primary osteoarthritis of knee: Secondary | ICD-10-CM | POA: Diagnosis not present

## 2023-01-10 DIAGNOSIS — E785 Hyperlipidemia, unspecified: Secondary | ICD-10-CM | POA: Diagnosis not present

## 2023-01-10 DIAGNOSIS — M19012 Primary osteoarthritis, left shoulder: Secondary | ICD-10-CM | POA: Diagnosis not present

## 2023-01-10 DIAGNOSIS — B351 Tinea unguium: Secondary | ICD-10-CM | POA: Diagnosis not present

## 2023-01-10 DIAGNOSIS — M109 Gout, unspecified: Secondary | ICD-10-CM | POA: Diagnosis not present

## 2023-01-10 DIAGNOSIS — I10 Essential (primary) hypertension: Secondary | ICD-10-CM | POA: Diagnosis not present

## 2023-01-14 ENCOUNTER — Telehealth: Payer: Self-pay | Admitting: Internal Medicine

## 2023-01-14 NOTE — Telephone Encounter (Signed)
Called and left vm for Melissa concerning this.

## 2023-01-14 NOTE — Telephone Encounter (Signed)
.  Home Health Certification or Plan of Care Tracking  Is this a Certification or Plan of Care? Yes  [place] Agency: [facility]  Order Number:  [id number]  Has charge sheet been attached? yes  Where has form been placed:  In provider's box  Faxed to:   [phone]

## 2023-01-14 NOTE — Telephone Encounter (Signed)
I have received these forms and have faxed them back today.

## 2023-01-14 NOTE — Telephone Encounter (Signed)
.  Home Health Verbal Orders  Agency:  [facility]   Caller: Renie Ora and title: OT Therapist, [phone] (Secure VM)  Requesting OT/ PT/ Skilled nursing/ Social Work/ Speech:  OT  Reason for Request:    Frequency:  0 x 1wk          1 x 3wks            HH needs F2F w/in last 30 days

## 2023-01-15 NOTE — Telephone Encounter (Signed)
Allen [name] with [facility] states Certification form has not been received.   Requests form be re-faxed to fax# [phone]

## 2023-01-16 NOTE — Telephone Encounter (Signed)
Spoke with [place] from [place], she is going to refax these forms as we did not receive them.

## 2023-01-20 ENCOUNTER — Ambulatory Visit: Payer: Medicare Other | Admitting: Internal Medicine

## 2023-01-20 ENCOUNTER — Encounter: Payer: Self-pay | Admitting: Internal Medicine

## 2023-01-20 ENCOUNTER — Telehealth: Payer: Self-pay

## 2023-01-20 VITALS — BP 140/70 | HR 63 | Temp 98.2°F | Ht 72.0 in | Wt 281.6 lb

## 2023-01-20 DIAGNOSIS — I2510 Atherosclerotic heart disease of native coronary artery without angina pectoris: Secondary | ICD-10-CM

## 2023-01-20 DIAGNOSIS — R0601 Orthopnea: Secondary | ICD-10-CM | POA: Diagnosis not present

## 2023-01-20 DIAGNOSIS — H1013 Acute atopic conjunctivitis, bilateral: Secondary | ICD-10-CM

## 2023-01-20 DIAGNOSIS — B351 Tinea unguium: Secondary | ICD-10-CM

## 2023-01-20 DIAGNOSIS — G4733 Obstructive sleep apnea (adult) (pediatric): Secondary | ICD-10-CM | POA: Diagnosis not present

## 2023-01-20 DIAGNOSIS — Z9861 Coronary angioplasty status: Secondary | ICD-10-CM

## 2023-01-20 MED ORDER — CICLOPIROX OLAMINE 0.77 % EX CREA: TOPICAL_CREAM | Freq: Two times a day (BID) | CUTANEOUS | 0 refills | Status: DC

## 2023-01-20 NOTE — Assessment & Plan Note (Signed)
Encouraged continuing with attempts to get Ozempic for this. Encouraged continuing with Repatha and heart-healthy diet

## 2023-01-20 NOTE — Assessment & Plan Note (Signed)
>>  ASSESSMENT AND PLAN FOR CAD S/P PCI WRITTEN ON 01/20/2023  8:26 PM BY Lula Olszewski, MD  Encouraged continuing with attempts to get Ozempic for this. Encouraged continuing with Repatha and heart-healthy diet

## 2023-01-20 NOTE — Assessment & Plan Note (Addendum)
Encouraged patient to to discuss with pulmonology - sent message to his pulmonologist to discuss Also sent message to cardiologist about recent flare of this, ? If CHF diagnosis is appropriate to add.  He is on fluid pills. Last echocardiogram 05/2021  Close follow up complex patient that I met 2 weeks ago. Accompanied by wife who is concerned about his sleep- he tried to sleep flat on his back instead of recliner [place] and that led to his severe pulmonary distress briefly which he did not go to the ER for.  He also reports he is not wearing CPAP and not breathing well generally ever since he had a blood clot couple years ago.  So I decided for this visit to focus on cardiopulmonary status and optimize that. In particular I think he may have some type of congestive heart failure even though it is not on his record wife says that someone else thought that before.  Looking at his echocardiogram as he is slightly depressed ventricular function my suspicion is that he has right-sided heart failure causing chronic venous insufficiency that has caused left-sided heart failure which causes the orthopnea. In other words heart failure with preserved ejection fraction and pulmonary hypertension.  If I can get agreement on these suspected diagnosis  from cardiologist I can probably add spironolactone to help with the orthopnea and Jardiance as well.  Will do another follow-up after I get a chest x-ray and have him meet with his cardiologist for a repeat echocardiogram which he is due for in my opinion.  I shared all these thoughts with Allen [name] and he felt open to exploring the possibilities also was not even willing to reconsider CPAP variations that might overcome his dilemma with feeling suffocated by the mask and claustrophobic from it

## 2023-01-20 NOTE — Assessment & Plan Note (Signed)
Wife wrote down olopatadine.   Alternative take benadryl at bedtime.

## 2023-01-20 NOTE — Telephone Encounter (Signed)
Left detailed message on answering machine. Pt to confirm if he is not taking rosuvastation 40mg  daily and give [place] a call back.

## 2023-01-20 NOTE — Telephone Encounter (Signed)
Lorretta Harp, MD  Beatrix Fetters, RN Direct LDL of 89.  On Crestor 20 mg a day, increase to 40 mg a day and recheck lipid liver profile in 3 months.  LDL goal less than 70.  Pt is on repatha and is not currently taking rosuvastatin 40mg . Pt has been seen in the lipid clinic by Karren Cobble, PharmD on 04/19/22 who started pt on repatha. Will send message to pharmd to advise. Pt verbalizes understanding.

## 2023-01-20 NOTE — Progress Notes (Signed)
[name]: [id number]   Routine Medical Office Visit  Patient:  Allen [name]      Age: 77 y.o.       Sex:  male  Date:   01/20/2023  PCP:    Allen Pacas, MD   [place] Provider: Loralee Pacas, MD   Assessment and Plan:     Sleeps in sitting position due to orthopnea Assessment & Plan: Encouraged patient to to discuss with pulmonology - sent message to his pulmonologist to discuss Also sent message to cardiologist about recent flare of this, ? If CHF diagnosis is appropriate to add.  He is on fluid pills. Last echocardiogram 05/2021  Close follow up complex patient that I met 2 weeks ago. Accompanied by wife who is concerned about his sleep- he tried to sleep flat on his back instead of recliner [place] and that led to his severe pulmonary distress briefly which he did not go to the ER for.  He also reports he is not wearing CPAP and not breathing well generally ever since he had a blood clot couple years ago.  So I decided for this visit to focus on cardiopulmonary status and optimize that. In particular I think he may have some type of congestive heart failure even though it is not on his record wife says that someone else thought that before.  Looking at his echocardiogram as he is slightly depressed ventricular function my suspicion is that he has right-sided heart failure causing chronic venous insufficiency that has caused left-sided heart failure which causes the orthopnea. In other words heart failure with preserved ejection fraction and pulmonary hypertension.  If I can get agreement on these suspected diagnosis  from cardiologist I can probably add spironolactone to help with the orthopnea and Jardiance as well.  Will do another follow-up after I get a chest x-ray and have him meet with his cardiologist for a repeat echocardiogram which he is due for in my opinion.  I shared all these thoughts with Allen [name] and he felt  open to exploring the possibilities also was not even willing to reconsider CPAP variations that might overcome his dilemma with feeling suffocated by the mask and claustrophobic from it   Allergic conjunctivitis of both eyes Assessment & Plan: Wife wrote down olopatadine.   Alternative take benadryl at bedtime.   OSA (obstructive sleep apnea) -     DG Chest 2 View; Future  Onychomycosis -     Ciclopirox Olamine; Apply topically 2 (two) times daily.  Dispense: 45 g; Refill: 0  Morbid obesity  AVS  Please check with the pharmacy about why we do not have Ozempic and let me know what I need to do.  We have a prior Auth team that can help with prior authorizations if that is needed.  The reason for the Ozempic is purely for cardiac disease and is now improved FDA indication.  Also try to get the Pataday for the eye discharge.  Also stop and get a chest x-ray and I will be checking with your lung and heart specialists but I want you to also make sure you are scheduled to see your heart doctor soon.  I sent in a new medication for your toenail fungus  Please schedule a close follow up appointment 1-2 months from now at checkout.     Clinical Presentation:   77 y.o. male here today for 2 Week Follow-up  Reviewed:  has a past medical history of Arthritis, CAD (coronary artery disease), Cataract, Chest pain (08/08/2011), Chronic left shoulder pain, Chronic neck pain (09/17/2015), Collagenous colitis (2002), COPD (chronic obstructive pulmonary disease), Former smoker (quit 1999), Gastric mass - submucsaol - antrum (03/03/2015), GERD (08/21/2007), GERD (gastroesophageal reflux disease), Health maintenance examination (08/08/2011), Hepatic steatosis (04/2014), Hiatal hernia, Hyperlipidemia, Hypertension, Knee sprain (02/17/2013), Obesity, Class II, BMI 35-39.9, OSA (obstructive sleep apnea) (11/29/2021), Prediabetes (01/2015), Prostate cancer screening (08/08/2011), and Spinal cord trauma. Active  Ambulatory Problems    Diagnosis Date Noted   Dyslipidemia, goal LDL below 70 08/21/2007   Unspecified hearing loss 04/19/2010   Essential hypertension 08/21/2007   CAD S/P PCI 08/21/2007   Anxiety and depression 12/27/2010   De Quervain's tenosynovitis, right 05/01/2014   Thyromegaly 05/01/2014   Rotator cuff impingement syndrome of left shoulder 08/02/2014   COPD (chronic obstructive pulmonary disease) with emphysema 02/07/2015   Gastric mass - submucsaol - antrum 03/03/2015   Chronic neck pain 09/17/2015   Morbid obesity 02/19/2016   Left shoulder pain 02/19/2016   Anxiety state 02/19/2016   Subclavian artery stenosis, left 01/12/2018   Nocturia more than twice per night 11/26/2018   Lower extremity edema 03/26/2019   History of non-ST elevation myocardial infarction (NSTEMI) 12/27/2019   Status post coronary artery stent placement    Pulmonary embolus 05/24/2021   Pulmonary infarct 05/24/2021   Leukocytosis 05/24/2021   Chronic respiratory failure with hypoxia 06/01/2021   Daytime sleepiness 07/05/2021   OSA (obstructive sleep apnea) 11/29/2021   History of pulmonary embolus (PE) 06/29/2021   Coronary artery disease 08/21/2007   Dyslipidemia 06/29/2021   Gouty arthropathy 06/29/2021   Spondylosis 06/29/2021   Depression 12/27/2010   Mobility impaired 12/30/2022   Bilateral primary osteoarthritis of knee 12/30/2022   Circulation disorder of lower extremity 12/30/2022   Prediabetes 12/30/2022   Onychomycosis 12/30/2022   Cold-induced asthma 12/30/2022   Allergic conjunctivitis of both eyes 12/30/2022   Irritability 12/30/2022   Overuse syndrome of low back 12/30/2022   Hand numbness 12/30/2022   B12 deficiency 01/01/2023   Lumbar back pain 01/03/2023   Aortic atherosclerosis 01/03/2023   Sleeps in sitting position due to orthopnea 01/20/2023   Resolved Ambulatory Problems    Diagnosis Date Noted   GERD 08/21/2007   VIRAL URI 12/24/2010   Acute bronchitis  08/08/2011   Health maintenance examination 08/08/2011   Chest pain 08/08/2011   Prostate cancer screening 08/08/2011   Uncontrolled hypertension 02/17/2013   Knee sprain and strain 02/17/2013   Acute bronchitis 04/07/2013   History of tobacco abuse 04/07/2013   Wheezing 10/16/2013   Unstable angina ([place]) 04/26/2014   Knee sprain 02/17/2013   Past Medical History:  Diagnosis Date   Arthritis    CAD (coronary artery disease)    Cataract    Chronic left shoulder pain    Collagenous colitis 2002   COPD (chronic obstructive pulmonary disease)    Former smoker quit 1999   GERD (gastroesophageal reflux disease)    Hepatic steatosis 04/2014   Hiatal hernia    Hyperlipidemia    Hypertension    Obesity, Class II, BMI 35-39.9    Spinal cord trauma     Outpatient Medications Prior to Visit  Medication Sig   albuterol (VENTOLIN HFA) 108 (90 Base) MCG/ACT inhaler INHALE 2 PUFFS BY MOUTH EVERY 6 HOURS AS NEEDED FOR WHEEZING OR SHORTNESS OF BREATH   allopurinol (ZYLOPRIM) 100 MG tablet Take 1 tablet (100 mg total) by mouth daily.  apixaban (ELIQUIS) 5 MG TABS tablet Take 1 tablet (5 mg total) by mouth 2 (two) times daily.   aspirin EC 81 MG EC tablet Take 1 tablet (81 mg total) by mouth daily.   budesonide-formoterol (SYMBICORT) 80-4.5 MCG/ACT inhaler Take 2 puffs by mouth twice daily   colchicine 0.6 MG tablet Take 1 tablet (0.6 mg total) by mouth daily. Until flare resolves. Do not take rosuvastatin or aspirin when taking this medication.   cyanocobalamin (VITAMIN B12) 1000 MCG tablet Take 1 tablet (1,000 mcg total) by mouth daily.   Evolocumab (REPATHA SURECLICK) [id number] MG/ML SOAJ Inject 1 mL into the skin every 14 (fourteen) days.   FLUoxetine (PROZAC) 20 MG tablet Take 1 tablet (20 mg total) by mouth daily. To start: just half tablet daily for 2 weeks   furosemide (LASIX) 20 MG tablet TAKE 2 TABLETS FOR 3 DAYS THEN DECREASE TO 1 TABLET   isosorbide mononitrate (IMDUR) 30 MG 24 hr tablet  Take 1 tablet (30 mg total) by mouth daily.   metFORMIN (GLUCOPHAGE) 500 MG tablet Take 1 tablet (500 mg total) by mouth 2 (two) times daily with a meal.   metoprolol succinate (TOPROL-XL) 50 MG 24 hr tablet TAKE 1 TABLET BY MOUTH DAILY. TAKE WITH OR IMMEDIATELY FOLLOWING A MEAL.   nitroGLYCERIN (NITROSTAT) 0.4 MG SL tablet PLACE 1 TABLET UNDER THE TONGUE EVERY 5 MINUTES AS NEEDED FOR CHEST PAIN.   olmesartan-hydrochlorothiazide (BENICAR HCT) 20-12.5 MG tablet Take 1 tablet by mouth daily.   Olopatadine HCl (PATADAY) 0.2 % SOLN Apply 1 drop to each eye once daily.   rosuvastatin (CRESTOR) 40 MG tablet Take 1 tablet (40 mg total) by mouth daily.   Semaglutide,0.25 or 0.5MG /DOS, (OZEMPIC, 0.25 OR 0.5 MG/DOSE,) 2 MG/3ML SOPN Inject 0.25 mg into the skin once a week.   valsartan-hydrochlorothiazide (DIOVAN-HCT) 160-25 MG tablet    No facility-administered medications prior to visit.    HPI  Updated and modified:  Problem  Sleeps in Sitting Position Due to Orthopnea   Ever since pulmonary embolism Last echocardiogram over 1 year(s) ago and history of chf    Allergic Conjunctivitis of Both Eyes   Sent olopatadine he has not filled yet and still getting discharge   Osa (Obstructive Sleep Apnea)   Intolerant to CPAP Sleeps in upright position.    Morbid Obesity   BMI 38 Comorbid coronary artery disease, hyperlipidemia, obstructive sleep apnea,hypertension,aortic atherosclerosis     Prostate Cancer Screening (Resolved)   Lab Results  Component Value Date   PSA 1.03 06/17/2018   PSA 2.45 10/16/2016   PSA 0.71 05/22/2015                   Clinical Data Analysis:   Physical Exam  BP (!) 140/70 (BP Location: Right Arm, Patient Position: Sitting)   Pulse 63   Temp 98.2 F (36.8 C) (Temporal)   Ht 6' (1.829 m)   Wt 281 lb 9.6 oz (127.7 kg)   SpO2 96%   BMI 38.19 kg/m  Wt Readings from Last 10 Encounters:  01/20/23 281 lb 9.6 oz (127.7 kg)  12/30/22 281 lb 6.4 oz (127.6  kg)  05/06/22 277 lb (125.6 kg)  04/09/22 277 lb 6.4 oz (125.8 kg)  02/27/22 280 lb 9.6 oz (127.3 kg)  11/29/21 282 lb 6.4 oz (128.1 kg)  10/04/21 278 lb (126.1 kg)  08/21/21 275 lb (124.7 kg)  08/21/21 275 lb (124.7 kg)  07/02/21 277 lb (125.6 kg)   Vital signs reviewed.  Nursing notes reviewed. Weight trend reviewed. Abnormalities and Problem-Specific physical exam findings:  no observable dyspnea sitting upright at rest General Appearance:  No acute distress appreciable.   Well-groomed, healthy-appearing male.  Well proportioned with no abnormal fat distribution.  Good muscle tone. Skin: Clear and well-hydrated. Pulmonary:  Normal work of breathing at rest, no respiratory distress apparent. SpO2: 96 %  Musculoskeletal: Patient demonstrates smooth and coordinated movements throughout all major joints.All extremities are intact.  Neurological:  Awake, alert, oriented, and engaged.  No obvious focal neurological deficits or cognitive impairments.  Sensorium seems unclouded. Gait is smooth and coordinated.  Speech is clear and coherent with logical content. Psychiatric:  Appropriate mood, pleasant and cooperative demeanor, cheerful and engaged during the exam    Additional Results Reviewed:     No results found for any visits on 01/20/23.  Recent Results (from the past 2160 hour(s))  Methylmalonic acid, serum     Status: None   Collection Time: 12/31/22 10:42 AM  Result Value Ref Range   Methylmalonic Acid, Quant 245 87 - 318 nmol/L    Comment: . This test was developed and its analytical performance characteristics have been determined by [place], [place]. It has not been cleared or approved by the U.S. Food and Drug Administration. This assay has been validated pursuant to the CLIA regulations and is used for clinical purposes. .   B12 and Folate Panel     Status: Abnormal   Collection Time: 12/31/22 10:42 AM  Result Value Ref Range    Vitamin B-12 191 (L) 211 - 911 pg/mL   Folate 8.6 >5.9 ng/mL  Uric acid     Status: None   Collection Time: 12/31/22 10:42 AM  Result Value Ref Range   Uric Acid, Serum 6.4 4.0 - 7.8 mg/dL  Hemoglobin A1c     Status: None   Collection Time: 12/31/22 10:42 AM  Result Value Ref Range   Hgb A1c MFr Bld 6.0 4.6 - 6.5 %    Comment: Glycemic Control Guidelines for People with Diabetes:Non Diabetic:  <6%Goal of Therapy: <7%Additional Action Suggested:  >8%   TSH     Status: None   Collection Time: 12/31/22 10:42 AM  Result Value Ref Range   TSH 2.10 0.35 - 5.50 uIU/mL  Lipid panel     Status: Abnormal   Collection Time: 12/31/22 10:42 AM  Result Value Ref Range   Cholesterol 147 0 - 200 mg/dL    Comment: ATP III Classification       Desirable:  < 200 mg/dL               Borderline High:  200 - 239 mg/dL          High:  > = 240 mg/dL   Triglycerides 206.0 (H) 0.0 - 149.0 mg/dL    Comment: Normal:  <150 mg/dLBorderline High:  150 - 199 mg/dL   HDL 38.30 (L) >39.00 mg/dL   VLDL 41.2 (H) 0.0 - 40.0 mg/dL   Total CHOL/HDL Ratio 4     Comment:                Men          Women1/2 Average Risk     3.4          3.3Average Risk          5.0          4.42X Average Risk  9.6          7.13X Average Risk          15.0          11.0                       NonHDL 108.81     Comment: NOTE:  Non-HDL goal should be 30 mg/dL higher than patient's LDL goal (i.e. LDL goal of < 70 mg/dL, would have non-HDL goal of < 100 mg/dL)  Comprehensive metabolic panel     Status: Abnormal   Collection Time: 12/31/22 10:42 AM  Result Value Ref Range   Sodium 138 135 - 145 mEq/L   Potassium 4.6 3.5 - 5.1 mEq/L   Chloride 101 96 - 112 mEq/L   CO2 27 19 - 32 mEq/L   Glucose, Bld 104 (H) 70 - 99 mg/dL   BUN 21 6 - 23 mg/dL   Creatinine, Ser 0.96 0.40 - 1.50 mg/dL   Total Bilirubin 0.4 0.2 - 1.2 mg/dL   Alkaline Phosphatase 83 39 - 117 U/L   AST 16 0 - 37 U/L   ALT 18 0 - 53 U/L   Total Protein 7.5 6.0 - 8.3 g/dL    Albumin 4.2 3.5 - 5.2 g/dL   GFR 77.98 >60.00 mL/min    Comment: Calculated using the CKD-EPI Creatinine Equation (2021)   Calcium 9.8 8.4 - 10.5 mg/dL  CBC     Status: Abnormal   Collection Time: 12/31/22 10:42 AM  Result Value Ref Range   WBC 7.3 4.0 - 10.5 K/uL   RBC 5.14 4.22 - 5.81 Mil/uL   Platelets 260.0 150.0 - 400.0 K/uL   Hemoglobin 14.6 13.0 - 17.0 g/dL   HCT 43.5 39.0 - 52.0 %   MCV 84.7 78.0 - 100.0 fl   MCHC 33.5 30.0 - 36.0 g/dL   RDW 15.6 (H) 11.5 - 15.5 %  LDL cholesterol, direct     Status: None   Collection Time: 12/31/22 10:42 AM  Result Value Ref Range   Direct LDL 89.0 mg/dL    Comment: Optimal:  <100 mg/dLNear or Above Optimal:  100-129 mg/dLBorderline High:  130-159 mg/dLHigh:  160-189 mg/dLVery High:  >190 mg/dL    No image results found.   DG Lumbar Spine 2-3 Views  Result Date: 01/02/2023 CLINICAL DATA:  Right lower back pain for 1 month EXAM: LUMBAR SPINE - 2-3 VIEW COMPARISON:  05/23/2021 FINDINGS: Frontal and lateral views of the lumbar spine are obtained. There are 5 non-rib-bearing lumbar type vertebral bodies identified. There is mild grade 1 anterolisthesis of L4 on L5 and L5 on S1. The left L5 spondylolysis seen on prior CT is not readily apparent on this exam. There is diffuse multilevel spondylosis and facet hypertrophy throughout the lumbar spine, greatest at L4-5 and L5-S1. No acute fractures. Sacroiliac joints are unremarkable. Diffuse aortic atherosclerosis. IMPRESSION: 1. Diffuse multilevel lumbar spondylosis and facet hypertrophy, greatest at L4-5 and L5-S1. 2. Grade 1 degenerative anterolisthesis of L4 on L5. 3. Grade 1 anterolisthesis of L5 on S1. The unilateral pars defects seen on prior CT is not visualized on this exam. 4. No acute fracture. Electronically Signed   By: Randa Ngo M.D.   On: 01/02/2023 22:46     --------------------------------    Signed: Loralee Pacas, MD 01/20/2023 8:25 PM

## 2023-01-20 NOTE — Patient Instructions (Signed)
It was a pleasure seeing you today!  Your health and satisfaction are my top priorities. If you believe your experience today was worthy of a 5-star rating, I'd be grateful for your feedback! Loralee Pacas, MD   CHECKOUT CHECKLIST  []    Schedule next appointment(s):    No follow-ups on file.  1-44m Any requested lab visits should be scheduled as appointments too  If you are not doing well:  Return to the office sooner Please bring all your medicine bottles to each appointment If your condition begins to worsen or become severe:  go to the emergency room or even call 911  []    Sign release of information authorizations: Any records we need for your care and to be your medical home  REMINDERS:  []    Please check with the pharmacy about why we do not have Ozempic and let me know what I need to do.  We have a prior Auth team that can help with prior authorizations if that is needed.  The reason for the Ozempic is purely for cardiac disease and is now improved FDA indication.  Also try to get the Pataday for the eye discharge.  Also stop and get a chest x-ray and I will be checking with your lung and heart specialists but I want you to also make sure you are scheduled to see your heart doctor soon.  I sent in a new medication for your toenail fungus  []    X-rays can be obtained at the  [facility] office. You can walk in M-F between 8:30am- noon or 1pm - 5pm. Tell them you are there for xrays ordered by me. They will send me the results, then I will let you know the results with instructions. Address: [address].  The Xray department is located in the basement.     []   (Optional):  Review your clinical notes on MyChart after they are completed.     Today's draft of the physician documented plan for today's visit: (final revisions will be visible on MyChart chart later) Sleeps in sitting position due to orthopnea Assessment & Plan: Encouraged patient to to discuss with  pulmonology - sent message to his pulmonologist to discuss Also sent message to cardiologist about recent flare of this, ? If CHF diagnosis is appropriate to add.  He is on fluid pills. Last echocardiogram 05/2021   Allergic conjunctivitis of both eyes Assessment & Plan: Wife wrote down olopatadine.   Alternative take benadryl at bedtime.   OSA (obstructive sleep apnea) -     DG Chest 2 View; Future  Onychomycosis -     Ciclopirox Olamine; Apply topically 2 (two) times daily.  Dispense: 45 g; Refill: 0      QUESTIONS & CONCERNS: CLINICAL: please contact us via phone [phone] OR MyChart messaging  LAB & IMAGING:   We will call you if the results are significantly abnormal or you don't use MyChart.  Most normal results will be posted to MyChart immediately and have a clinical review message by Dr. Randol Kern posted within 2-3 business days.   If you have not heard from [place] regarding the results in 2 weeks OR if you need priority reporting, please contact this office. MYCHART:  The fastest way to get your results and easiest way to stay in touch with [place] is by activating your My Chart account. Instructions are located on the last page of this paperwork.  BILLING: xray and lab orders  are billed from separate companies and questions./concerns should be directed to the [place].  For visit charges please discuss with our administrative services COMPLAINTS:  please let Dr. Randol Kern know or see the [place], by asking at the front desk: we want you to be satisfied with every experience and we would be grateful for the opportunity to address any problems

## 2023-01-20 NOTE — Telephone Encounter (Signed)
Please verify patient is still taking rosuvasatin. If not, recommend restarting at 40mg  daily

## 2023-01-22 ENCOUNTER — Telehealth: Payer: Self-pay | Admitting: Internal Medicine

## 2023-01-22 NOTE — Telephone Encounter (Signed)
These forms have been received and faxed.

## 2023-01-22 NOTE — Telephone Encounter (Signed)
Contacted Allen [name] to schedule their annual wellness visit. Appointment made for 01/28/2023.  [place] Direct Dial [phone]

## 2023-01-23 DIAGNOSIS — J4489 Other specified chronic obstructive pulmonary disease: Secondary | ICD-10-CM | POA: Diagnosis not present

## 2023-01-23 DIAGNOSIS — H1013 Acute atopic conjunctivitis, bilateral: Secondary | ICD-10-CM | POA: Diagnosis not present

## 2023-01-23 DIAGNOSIS — M19012 Primary osteoarthritis, left shoulder: Secondary | ICD-10-CM | POA: Diagnosis not present

## 2023-01-23 DIAGNOSIS — G8929 Other chronic pain: Secondary | ICD-10-CM | POA: Diagnosis not present

## 2023-01-23 DIAGNOSIS — M109 Gout, unspecified: Secondary | ICD-10-CM | POA: Diagnosis not present

## 2023-01-23 DIAGNOSIS — J4540 Moderate persistent asthma, uncomplicated: Secondary | ICD-10-CM | POA: Diagnosis not present

## 2023-01-23 DIAGNOSIS — G4733 Obstructive sleep apnea (adult) (pediatric): Secondary | ICD-10-CM | POA: Diagnosis not present

## 2023-01-23 DIAGNOSIS — E785 Hyperlipidemia, unspecified: Secondary | ICD-10-CM | POA: Diagnosis not present

## 2023-01-23 DIAGNOSIS — I2583 Coronary atherosclerosis due to lipid rich plaque: Secondary | ICD-10-CM | POA: Diagnosis not present

## 2023-01-23 DIAGNOSIS — R7303 Prediabetes: Secondary | ICD-10-CM | POA: Diagnosis not present

## 2023-01-23 DIAGNOSIS — M170 Bilateral primary osteoarthritis of knee: Secondary | ICD-10-CM | POA: Diagnosis not present

## 2023-01-23 DIAGNOSIS — K449 Diaphragmatic hernia without obstruction or gangrene: Secondary | ICD-10-CM | POA: Diagnosis not present

## 2023-01-23 DIAGNOSIS — I10 Essential (primary) hypertension: Secondary | ICD-10-CM | POA: Diagnosis not present

## 2023-01-23 DIAGNOSIS — B351 Tinea unguium: Secondary | ICD-10-CM | POA: Diagnosis not present

## 2023-01-23 DIAGNOSIS — M545 Low back pain, unspecified: Secondary | ICD-10-CM | POA: Diagnosis not present

## 2023-01-23 NOTE — Telephone Encounter (Signed)
Contacted Allen [name] to schedule their annual wellness visit. Appointment made for 01/28/2023.  [place] Direct Dial [phone]

## 2023-01-24 DIAGNOSIS — E785 Hyperlipidemia, unspecified: Secondary | ICD-10-CM | POA: Diagnosis not present

## 2023-01-24 DIAGNOSIS — M19012 Primary osteoarthritis, left shoulder: Secondary | ICD-10-CM | POA: Diagnosis not present

## 2023-01-24 DIAGNOSIS — M545 Low back pain, unspecified: Secondary | ICD-10-CM | POA: Diagnosis not present

## 2023-01-24 DIAGNOSIS — M109 Gout, unspecified: Secondary | ICD-10-CM | POA: Diagnosis not present

## 2023-01-24 DIAGNOSIS — I2583 Coronary atherosclerosis due to lipid rich plaque: Secondary | ICD-10-CM | POA: Diagnosis not present

## 2023-01-24 DIAGNOSIS — H1013 Acute atopic conjunctivitis, bilateral: Secondary | ICD-10-CM | POA: Diagnosis not present

## 2023-01-24 DIAGNOSIS — J4489 Other specified chronic obstructive pulmonary disease: Secondary | ICD-10-CM | POA: Diagnosis not present

## 2023-01-24 DIAGNOSIS — G4733 Obstructive sleep apnea (adult) (pediatric): Secondary | ICD-10-CM | POA: Diagnosis not present

## 2023-01-24 DIAGNOSIS — B351 Tinea unguium: Secondary | ICD-10-CM | POA: Diagnosis not present

## 2023-01-24 DIAGNOSIS — K449 Diaphragmatic hernia without obstruction or gangrene: Secondary | ICD-10-CM | POA: Diagnosis not present

## 2023-01-24 DIAGNOSIS — G8929 Other chronic pain: Secondary | ICD-10-CM | POA: Diagnosis not present

## 2023-01-24 DIAGNOSIS — J4540 Moderate persistent asthma, uncomplicated: Secondary | ICD-10-CM | POA: Diagnosis not present

## 2023-01-24 DIAGNOSIS — I10 Essential (primary) hypertension: Secondary | ICD-10-CM | POA: Diagnosis not present

## 2023-01-24 DIAGNOSIS — R7303 Prediabetes: Secondary | ICD-10-CM | POA: Diagnosis not present

## 2023-01-24 DIAGNOSIS — M170 Bilateral primary osteoarthritis of knee: Secondary | ICD-10-CM | POA: Diagnosis not present

## 2023-01-27 ENCOUNTER — Ambulatory Visit (INDEPENDENT_AMBULATORY_CARE_PROVIDER_SITE_OTHER)
Admission: RE | Admit: 2023-01-27 | Discharge: 2023-01-27 | Disposition: A | Discharge status: Home / Self Care | Payer: Medicare Other | Source: Ambulatory Visit | Attending: Internal Medicine | Admitting: Internal Medicine

## 2023-01-27 DIAGNOSIS — G4733 Obstructive sleep apnea (adult) (pediatric): Secondary | ICD-10-CM | POA: Diagnosis not present

## 2023-01-27 DIAGNOSIS — Z0389 Encounter for observation for other suspected diseases and conditions ruled out: Secondary | ICD-10-CM | POA: Diagnosis not present

## 2023-01-28 ENCOUNTER — Ambulatory Visit (INDEPENDENT_AMBULATORY_CARE_PROVIDER_SITE_OTHER): Payer: Medicare Other

## 2023-01-28 VITALS — Wt 281.0 lb

## 2023-01-28 DIAGNOSIS — M170 Bilateral primary osteoarthritis of knee: Secondary | ICD-10-CM | POA: Diagnosis not present

## 2023-01-28 DIAGNOSIS — M545 Low back pain, unspecified: Secondary | ICD-10-CM | POA: Diagnosis not present

## 2023-01-28 DIAGNOSIS — J4489 Other specified chronic obstructive pulmonary disease: Secondary | ICD-10-CM | POA: Diagnosis not present

## 2023-01-28 DIAGNOSIS — M109 Gout, unspecified: Secondary | ICD-10-CM | POA: Diagnosis not present

## 2023-01-28 DIAGNOSIS — M19012 Primary osteoarthritis, left shoulder: Secondary | ICD-10-CM | POA: Diagnosis not present

## 2023-01-28 DIAGNOSIS — R7303 Prediabetes: Secondary | ICD-10-CM | POA: Diagnosis not present

## 2023-01-28 DIAGNOSIS — Z0000 Encounter for general adult medical examination without abnormal findings: Secondary | ICD-10-CM | POA: Diagnosis not present

## 2023-01-28 DIAGNOSIS — B351 Tinea unguium: Secondary | ICD-10-CM | POA: Diagnosis not present

## 2023-01-28 DIAGNOSIS — G4733 Obstructive sleep apnea (adult) (pediatric): Secondary | ICD-10-CM | POA: Diagnosis not present

## 2023-01-28 DIAGNOSIS — K449 Diaphragmatic hernia without obstruction or gangrene: Secondary | ICD-10-CM | POA: Diagnosis not present

## 2023-01-28 DIAGNOSIS — I2583 Coronary atherosclerosis due to lipid rich plaque: Secondary | ICD-10-CM | POA: Diagnosis not present

## 2023-01-28 DIAGNOSIS — I10 Essential (primary) hypertension: Secondary | ICD-10-CM | POA: Diagnosis not present

## 2023-01-28 DIAGNOSIS — E785 Hyperlipidemia, unspecified: Secondary | ICD-10-CM | POA: Diagnosis not present

## 2023-01-28 DIAGNOSIS — J4540 Moderate persistent asthma, uncomplicated: Secondary | ICD-10-CM | POA: Diagnosis not present

## 2023-01-28 DIAGNOSIS — H1013 Acute atopic conjunctivitis, bilateral: Secondary | ICD-10-CM | POA: Diagnosis not present

## 2023-01-28 DIAGNOSIS — G8929 Other chronic pain: Secondary | ICD-10-CM | POA: Diagnosis not present
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
## 2023-01-28 NOTE — Patient Instructions (Signed)
Allen [name] , Thank you for taking time to come for your Medicare Wellness Visit. I appreciate your ongoing commitment to your health goals. Please review the following plan we discussed and let me know if I can assist you in the future.   These are the goals we discussed:  Goals      Patient Stated     Would like to lose some weight & increase activity     Patient Stated     Lose weight         This is a list of the screening recommended for you and due dates:  Health Maintenance  Topic Date Due   Flu Shot  05/22/2023   Medicare Annual Wellness Visit  01/28/2024   Pneumonia Vaccine  Completed   HPV Vaccine  Aged Out   DTaP/Tdap/Td vaccine  Discontinued   Colon Cancer Screening  Discontinued   COVID-19 Vaccine  Discontinued   Hepatitis C Screening: USPSTF Recommendation to screen - Ages 11-79 yo.  Discontinued   Zoster (Shingles) Vaccine  Discontinued    Advanced directives: copies in chart   Conditions/risks identified: lose weight   Next appointment: Follow up in one year for your annual wellness visit.   Preventive Care 28 Years and Older, Male  Preventive care refers to lifestyle choices and visits with your health care provider that can promote health and wellness. What does preventive care include? A yearly physical exam. This is also called an annual well check. Dental exams once or twice a year. Routine eye exams. Ask your health care provider how often you should have your eyes checked. Personal lifestyle choices, including: Daily care of your teeth and gums. Regular physical activity. Eating a healthy diet. Avoiding tobacco and drug use. Limiting alcohol use. Practicing safe sex. Taking low doses of aspirin every day. Taking vitamin and mineral supplements as recommended by your health care provider. What happens during an annual well check? The services and screenings done by your health care provider during your annual well check will depend on your  age, overall health, lifestyle risk factors, and family history of disease. Counseling  Your health care provider may ask you questions about your: Alcohol use. Tobacco use. Drug use. Emotional well-being. Home and relationship well-being. Sexual activity. Eating habits. History of falls. Memory and ability to understand (cognition). Work and work Astronomer. Screening  You may have the following tests or measurements: Height, weight, and BMI. Blood pressure. Lipid and cholesterol levels. These may be checked every 5 years, or more frequently if you are over 20 years old. Skin check. Lung cancer screening. You may have this screening every year starting at age 43 if you have a 30-pack-year history of smoking and currently smoke or have quit within the past 15 years. Fecal occult blood test (FOBT) of the stool. You may have this test every year starting at age 52. Flexible sigmoidoscopy or colonoscopy. You may have a sigmoidoscopy every 5 years or a colonoscopy every 10 years starting at age 50. Prostate cancer screening. Recommendations will vary depending on your family history and other risks. Hepatitis C blood test. Hepatitis B blood test. Sexually transmitted disease (STD) testing. Diabetes screening. This is done by checking your blood sugar (glucose) after you have not eaten for a while (fasting). You may have this done every 1-3 years. Abdominal aortic aneurysm (AAA) screening. You may need this if you are a current or former smoker. Osteoporosis. You may be screened starting at age 60 if  you are at high risk. Talk with your health care provider about your test results, treatment options, and if necessary, the need for more tests. Vaccines  Your health care provider may recommend certain vaccines, such as: Influenza vaccine. This is recommended every year. Tetanus, diphtheria, and acellular pertussis (Tdap, Td) vaccine. You may need a Td booster every 10 years. Zoster  vaccine. You may need this after age 39. Pneumococcal 13-valent conjugate (PCV13) vaccine. One dose is recommended after age 67. Pneumococcal polysaccharide (PPSV23) vaccine. One dose is recommended after age 26. Talk to your health care provider about which screenings and vaccines you need and how often you need them. This information is not intended to replace advice given to you by your health care provider. Make sure you discuss any questions you have with your health care provider. Document Released: 11/03/2015 Document Revised: 06/26/2016 Document Reviewed: 08/08/2015 Elsevier Interactive Patient Education  2017 [place] Prevention in the Home Falls can cause injuries. They can happen to people of all ages. There are many things you can do to make your home safe and to help prevent falls. What can I do on the outside of my home? Regularly fix the edges of walkways and driveways and fix any cracks. Remove anything that might make you trip as you walk through a door, such as a raised step or threshold. Trim any bushes or trees on the path to your home. Use bright outdoor lighting. Clear any walking paths of anything that might make someone trip, such as rocks or tools. Regularly check to see if handrails are loose or broken. Make sure that both sides of any steps have handrails. Any raised decks and porches should have guardrails on the edges. Have any leaves, snow, or ice cleared regularly. Use sand or salt on walking paths during winter. Clean up any spills in your garage right away. This includes oil or grease spills. What can I do in the bathroom? Use night lights. Install grab bars by the toilet and in the tub and shower. Do not use towel bars as grab bars. Use non-skid mats or decals in the tub or shower. If you need to sit down in the shower, use a plastic, non-slip stool. Keep the floor dry. Clean up any water that spills on the floor as soon as it happens. Remove  soap buildup in the tub or shower regularly. Attach bath mats securely with double-sided non-slip rug tape. Do not have throw rugs and other things on the floor that can make you trip. What can I do in the bedroom? Use night lights. Make sure that you have a light by your bed that is easy to reach. Do not use any sheets or blankets that are too big for your bed. They should not hang down onto the floor. Have a firm chair that has side arms. You can use this for support while you get dressed. Do not have throw rugs and other things on the floor that can make you trip. What can I do in the kitchen? Clean up any spills right away. Avoid walking on wet floors. Keep items that you use a lot in easy-to-reach places. If you need to reach something above you, use a strong step stool that has a grab bar. Keep electrical cords out of the way. Do not use floor polish or wax that makes floors slippery. If you must use wax, use non-skid floor wax. Do not have throw rugs and other things on  the floor that can make you trip. What can I do with my stairs? Do not leave any items on the stairs. Make sure that there are handrails on both sides of the stairs and use them. Fix handrails that are broken or loose. Make sure that handrails are as long as the stairways. Check any carpeting to make sure that it is firmly attached to the stairs. Fix any carpet that is loose or worn. Avoid having throw rugs at the top or bottom of the stairs. If you do have throw rugs, attach them to the floor with carpet tape. Make sure that you have a light switch at the top of the stairs and the bottom of the stairs. If you do not have them, ask someone to add them for you. What else can I do to help prevent falls? Wear shoes that: Do not have high heels. Have rubber bottoms. Are comfortable and fit you well. Are closed at the toe. Do not wear sandals. If you use a stepladder: Make sure that it is fully opened. Do not climb a  closed stepladder. Make sure that both sides of the stepladder are locked into place. Ask someone to hold it for you, if possible. Clearly mark and make sure that you can see: Any grab bars or handrails. First and last steps. Where the edge of each step is. Use tools that help you move around (mobility aids) if they are needed. These include: Canes. Walkers. Scooters. Crutches. Turn on the lights when you go into a dark area. Replace any light bulbs as soon as they burn out. Set up your furniture so you have a clear path. Avoid moving your furniture around. If any of your floors are uneven, fix them. If there are any pets around you, be aware of where they are. Review your medicines with your doctor. Some medicines can make you feel dizzy. This can increase your chance of falling. Ask your doctor what other things that you can do to help prevent falls. This information is not intended to replace advice given to you by your health care provider. Make sure you discuss any questions you have with your health care provider. Document Released: 08/03/2009 Document Revised: 03/14/2016 Document Reviewed: 11/11/2014 Elsevier Interactive Patient Education  2017 [employer].

## 2023-01-30 ENCOUNTER — Encounter: Payer: Self-pay | Admitting: Internal Medicine

## 2023-01-30 DIAGNOSIS — M5134 Other intervertebral disc degeneration, thoracic region: Secondary | ICD-10-CM | POA: Insufficient documentation

## 2023-01-31 ENCOUNTER — Telehealth: Payer: Self-pay | Admitting: Internal Medicine

## 2023-01-31 DIAGNOSIS — R7303 Prediabetes: Secondary | ICD-10-CM | POA: Diagnosis not present

## 2023-01-31 DIAGNOSIS — H1013 Acute atopic conjunctivitis, bilateral: Secondary | ICD-10-CM | POA: Diagnosis not present

## 2023-01-31 DIAGNOSIS — M170 Bilateral primary osteoarthritis of knee: Secondary | ICD-10-CM | POA: Diagnosis not present

## 2023-01-31 DIAGNOSIS — J4489 Other specified chronic obstructive pulmonary disease: Secondary | ICD-10-CM | POA: Diagnosis not present

## 2023-01-31 DIAGNOSIS — B351 Tinea unguium: Secondary | ICD-10-CM | POA: Diagnosis not present

## 2023-01-31 DIAGNOSIS — J4540 Moderate persistent asthma, uncomplicated: Secondary | ICD-10-CM | POA: Diagnosis not present

## 2023-01-31 DIAGNOSIS — G8929 Other chronic pain: Secondary | ICD-10-CM | POA: Diagnosis not present

## 2023-01-31 DIAGNOSIS — I10 Essential (primary) hypertension: Secondary | ICD-10-CM | POA: Diagnosis not present

## 2023-01-31 DIAGNOSIS — M545 Low back pain, unspecified: Secondary | ICD-10-CM | POA: Diagnosis not present

## 2023-01-31 DIAGNOSIS — E785 Hyperlipidemia, unspecified: Secondary | ICD-10-CM | POA: Diagnosis not present

## 2023-01-31 DIAGNOSIS — I2583 Coronary atherosclerosis due to lipid rich plaque: Secondary | ICD-10-CM | POA: Diagnosis not present

## 2023-01-31 DIAGNOSIS — M19012 Primary osteoarthritis, left shoulder: Secondary | ICD-10-CM | POA: Diagnosis not present

## 2023-01-31 DIAGNOSIS — G4733 Obstructive sleep apnea (adult) (pediatric): Secondary | ICD-10-CM | POA: Diagnosis not present

## 2023-01-31 DIAGNOSIS — M109 Gout, unspecified: Secondary | ICD-10-CM | POA: Diagnosis not present

## 2023-01-31 DIAGNOSIS — K449 Diaphragmatic hernia without obstruction or gangrene: Secondary | ICD-10-CM | POA: Diagnosis not present

## 2023-01-31 NOTE — Telephone Encounter (Signed)
Home Health Verbal Orders  Agency:    Caller: Tawny Hopping [phone]   Requesting OT/ PT/ Skilled nursing/ Social Work/ Speech:  OT  Reason for Request:  Continuation of home health OT   Frequency:  1 x 5 weeks

## 2023-02-05 DIAGNOSIS — B351 Tinea unguium: Secondary | ICD-10-CM | POA: Diagnosis not present

## 2023-02-05 DIAGNOSIS — J4489 Other specified chronic obstructive pulmonary disease: Secondary | ICD-10-CM | POA: Diagnosis not present

## 2023-02-05 DIAGNOSIS — G8929 Other chronic pain: Secondary | ICD-10-CM | POA: Diagnosis not present

## 2023-02-05 DIAGNOSIS — R7303 Prediabetes: Secondary | ICD-10-CM | POA: Diagnosis not present

## 2023-02-05 DIAGNOSIS — Z8673 Personal history of transient ischemic attack (TIA), and cerebral infarction without residual deficits: Secondary | ICD-10-CM | POA: Diagnosis not present

## 2023-02-05 DIAGNOSIS — I10 Essential (primary) hypertension: Secondary | ICD-10-CM | POA: Diagnosis not present

## 2023-02-05 DIAGNOSIS — M19012 Primary osteoarthritis, left shoulder: Secondary | ICD-10-CM | POA: Diagnosis not present

## 2023-02-05 DIAGNOSIS — Z6839 Body mass index (BMI) 39.0-39.9, adult: Secondary | ICD-10-CM | POA: Diagnosis not present

## 2023-02-05 DIAGNOSIS — K449 Diaphragmatic hernia without obstruction or gangrene: Secondary | ICD-10-CM | POA: Diagnosis not present

## 2023-02-05 DIAGNOSIS — Z7901 Long term (current) use of anticoagulants: Secondary | ICD-10-CM | POA: Diagnosis not present

## 2023-02-05 DIAGNOSIS — Z7982 Long term (current) use of aspirin: Secondary | ICD-10-CM | POA: Diagnosis not present

## 2023-02-05 DIAGNOSIS — H1013 Acute atopic conjunctivitis, bilateral: Secondary | ICD-10-CM | POA: Diagnosis not present

## 2023-02-05 DIAGNOSIS — Z86711 Personal history of pulmonary embolism: Secondary | ICD-10-CM | POA: Diagnosis not present

## 2023-02-05 DIAGNOSIS — Z9181 History of falling: Secondary | ICD-10-CM | POA: Diagnosis not present

## 2023-02-05 DIAGNOSIS — Z7951 Long term (current) use of inhaled steroids: Secondary | ICD-10-CM | POA: Diagnosis not present

## 2023-02-05 DIAGNOSIS — J4540 Moderate persistent asthma, uncomplicated: Secondary | ICD-10-CM | POA: Diagnosis not present

## 2023-02-05 DIAGNOSIS — M109 Gout, unspecified: Secondary | ICD-10-CM | POA: Diagnosis not present

## 2023-02-05 DIAGNOSIS — Z7984 Long term (current) use of oral hypoglycemic drugs: Secondary | ICD-10-CM | POA: Diagnosis not present

## 2023-02-05 DIAGNOSIS — M170 Bilateral primary osteoarthritis of knee: Secondary | ICD-10-CM | POA: Diagnosis not present

## 2023-02-05 DIAGNOSIS — G4733 Obstructive sleep apnea (adult) (pediatric): Secondary | ICD-10-CM | POA: Diagnosis not present

## 2023-02-05 DIAGNOSIS — M545 Low back pain, unspecified: Secondary | ICD-10-CM | POA: Diagnosis not present

## 2023-02-05 DIAGNOSIS — I2583 Coronary atherosclerosis due to lipid rich plaque: Secondary | ICD-10-CM | POA: Diagnosis not present

## 2023-02-05 DIAGNOSIS — E785 Hyperlipidemia, unspecified: Secondary | ICD-10-CM | POA: Diagnosis not present

## 2023-02-05 DIAGNOSIS — Z87891 Personal history of nicotine dependence: Secondary | ICD-10-CM | POA: Diagnosis not present

## 2023-02-06 DIAGNOSIS — G8929 Other chronic pain: Secondary | ICD-10-CM | POA: Diagnosis not present

## 2023-02-06 DIAGNOSIS — I10 Essential (primary) hypertension: Secondary | ICD-10-CM | POA: Diagnosis not present

## 2023-02-06 DIAGNOSIS — M170 Bilateral primary osteoarthritis of knee: Secondary | ICD-10-CM | POA: Diagnosis not present

## 2023-02-06 DIAGNOSIS — H1013 Acute atopic conjunctivitis, bilateral: Secondary | ICD-10-CM | POA: Diagnosis not present

## 2023-02-06 DIAGNOSIS — M545 Low back pain, unspecified: Secondary | ICD-10-CM | POA: Diagnosis not present

## 2023-02-06 DIAGNOSIS — J4489 Other specified chronic obstructive pulmonary disease: Secondary | ICD-10-CM | POA: Diagnosis not present

## 2023-02-06 DIAGNOSIS — J4540 Moderate persistent asthma, uncomplicated: Secondary | ICD-10-CM | POA: Diagnosis not present

## 2023-02-06 DIAGNOSIS — R7303 Prediabetes: Secondary | ICD-10-CM | POA: Diagnosis not present

## 2023-02-06 DIAGNOSIS — I2583 Coronary atherosclerosis due to lipid rich plaque: Secondary | ICD-10-CM | POA: Diagnosis not present

## 2023-02-06 DIAGNOSIS — M109 Gout, unspecified: Secondary | ICD-10-CM | POA: Diagnosis not present

## 2023-02-06 DIAGNOSIS — E785 Hyperlipidemia, unspecified: Secondary | ICD-10-CM | POA: Diagnosis not present

## 2023-02-06 DIAGNOSIS — G4733 Obstructive sleep apnea (adult) (pediatric): Secondary | ICD-10-CM | POA: Diagnosis not present

## 2023-02-06 DIAGNOSIS — K449 Diaphragmatic hernia without obstruction or gangrene: Secondary | ICD-10-CM | POA: Diagnosis not present

## 2023-02-06 DIAGNOSIS — B351 Tinea unguium: Secondary | ICD-10-CM | POA: Diagnosis not present

## 2023-02-06 DIAGNOSIS — M19012 Primary osteoarthritis, left shoulder: Secondary | ICD-10-CM | POA: Diagnosis not present

## 2023-02-09 ENCOUNTER — Other Ambulatory Visit: Payer: Self-pay | Admitting: Cardiology

## 2023-02-13 DIAGNOSIS — B351 Tinea unguium: Secondary | ICD-10-CM | POA: Diagnosis not present

## 2023-02-13 DIAGNOSIS — J4540 Moderate persistent asthma, uncomplicated: Secondary | ICD-10-CM | POA: Diagnosis not present

## 2023-02-13 DIAGNOSIS — K449 Diaphragmatic hernia without obstruction or gangrene: Secondary | ICD-10-CM | POA: Diagnosis not present

## 2023-02-13 DIAGNOSIS — I10 Essential (primary) hypertension: Secondary | ICD-10-CM | POA: Diagnosis not present

## 2023-02-13 DIAGNOSIS — M19012 Primary osteoarthritis, left shoulder: Secondary | ICD-10-CM | POA: Diagnosis not present

## 2023-02-13 DIAGNOSIS — E785 Hyperlipidemia, unspecified: Secondary | ICD-10-CM | POA: Diagnosis not present

## 2023-02-13 DIAGNOSIS — H1013 Acute atopic conjunctivitis, bilateral: Secondary | ICD-10-CM | POA: Diagnosis not present

## 2023-02-13 DIAGNOSIS — M545 Low back pain, unspecified: Secondary | ICD-10-CM | POA: Diagnosis not present

## 2023-02-13 DIAGNOSIS — I2583 Coronary atherosclerosis due to lipid rich plaque: Secondary | ICD-10-CM | POA: Diagnosis not present

## 2023-02-13 DIAGNOSIS — G8929 Other chronic pain: Secondary | ICD-10-CM | POA: Diagnosis not present

## 2023-02-13 DIAGNOSIS — G4733 Obstructive sleep apnea (adult) (pediatric): Secondary | ICD-10-CM | POA: Diagnosis not present

## 2023-02-13 DIAGNOSIS — R7303 Prediabetes: Secondary | ICD-10-CM | POA: Diagnosis not present

## 2023-02-13 DIAGNOSIS — M170 Bilateral primary osteoarthritis of knee: Secondary | ICD-10-CM | POA: Diagnosis not present

## 2023-02-13 DIAGNOSIS — J4489 Other specified chronic obstructive pulmonary disease: Secondary | ICD-10-CM | POA: Diagnosis not present

## 2023-02-13 DIAGNOSIS — M109 Gout, unspecified: Secondary | ICD-10-CM | POA: Diagnosis not present

## 2023-02-17 DIAGNOSIS — M545 Low back pain, unspecified: Secondary | ICD-10-CM | POA: Diagnosis not present

## 2023-02-17 DIAGNOSIS — M109 Gout, unspecified: Secondary | ICD-10-CM | POA: Diagnosis not present

## 2023-02-17 DIAGNOSIS — I10 Essential (primary) hypertension: Secondary | ICD-10-CM | POA: Diagnosis not present

## 2023-02-17 DIAGNOSIS — G4733 Obstructive sleep apnea (adult) (pediatric): Secondary | ICD-10-CM | POA: Diagnosis not present

## 2023-02-17 DIAGNOSIS — I2583 Coronary atherosclerosis due to lipid rich plaque: Secondary | ICD-10-CM | POA: Diagnosis not present

## 2023-02-17 DIAGNOSIS — M170 Bilateral primary osteoarthritis of knee: Secondary | ICD-10-CM | POA: Diagnosis not present

## 2023-02-17 DIAGNOSIS — J4489 Other specified chronic obstructive pulmonary disease: Secondary | ICD-10-CM | POA: Diagnosis not present

## 2023-02-17 DIAGNOSIS — J4540 Moderate persistent asthma, uncomplicated: Secondary | ICD-10-CM | POA: Diagnosis not present

## 2023-02-17 DIAGNOSIS — G8929 Other chronic pain: Secondary | ICD-10-CM | POA: Diagnosis not present

## 2023-02-17 DIAGNOSIS — M19012 Primary osteoarthritis, left shoulder: Secondary | ICD-10-CM | POA: Diagnosis not present

## 2023-02-17 DIAGNOSIS — R7303 Prediabetes: Secondary | ICD-10-CM | POA: Diagnosis not present

## 2023-02-17 DIAGNOSIS — E785 Hyperlipidemia, unspecified: Secondary | ICD-10-CM | POA: Diagnosis not present

## 2023-02-17 DIAGNOSIS — H1013 Acute atopic conjunctivitis, bilateral: Secondary | ICD-10-CM | POA: Diagnosis not present

## 2023-02-17 DIAGNOSIS — B351 Tinea unguium: Secondary | ICD-10-CM | POA: Diagnosis not present

## 2023-02-17 DIAGNOSIS — K449 Diaphragmatic hernia without obstruction or gangrene: Secondary | ICD-10-CM | POA: Diagnosis not present

## 2023-02-18 NOTE — Telephone Encounter (Signed)
Called Melissa, no answer, left message on confidential VM. Per Dr Jon Billings, gave okay for VO.

## 2023-02-21 ENCOUNTER — Telehealth: Payer: Self-pay | Admitting: Pharmacist

## 2023-02-21 DIAGNOSIS — I10 Essential (primary) hypertension: Secondary | ICD-10-CM

## 2023-02-25 ENCOUNTER — Ambulatory Visit: Payer: Medicare Other | Admitting: Internal Medicine

## 2023-02-25 DIAGNOSIS — M19012 Primary osteoarthritis, left shoulder: Secondary | ICD-10-CM | POA: Diagnosis not present

## 2023-02-25 DIAGNOSIS — G4733 Obstructive sleep apnea (adult) (pediatric): Secondary | ICD-10-CM | POA: Diagnosis not present

## 2023-02-25 DIAGNOSIS — J4489 Other specified chronic obstructive pulmonary disease: Secondary | ICD-10-CM | POA: Diagnosis not present

## 2023-02-25 DIAGNOSIS — B351 Tinea unguium: Secondary | ICD-10-CM | POA: Diagnosis not present

## 2023-02-25 DIAGNOSIS — E785 Hyperlipidemia, unspecified: Secondary | ICD-10-CM | POA: Diagnosis not present

## 2023-02-25 DIAGNOSIS — M109 Gout, unspecified: Secondary | ICD-10-CM | POA: Diagnosis not present

## 2023-02-25 DIAGNOSIS — M170 Bilateral primary osteoarthritis of knee: Secondary | ICD-10-CM | POA: Diagnosis not present

## 2023-02-25 DIAGNOSIS — H1013 Acute atopic conjunctivitis, bilateral: Secondary | ICD-10-CM | POA: Diagnosis not present

## 2023-02-25 DIAGNOSIS — M545 Low back pain, unspecified: Secondary | ICD-10-CM | POA: Diagnosis not present

## 2023-02-25 DIAGNOSIS — K449 Diaphragmatic hernia without obstruction or gangrene: Secondary | ICD-10-CM | POA: Diagnosis not present

## 2023-02-25 DIAGNOSIS — I10 Essential (primary) hypertension: Secondary | ICD-10-CM | POA: Diagnosis not present

## 2023-02-25 DIAGNOSIS — J4540 Moderate persistent asthma, uncomplicated: Secondary | ICD-10-CM | POA: Diagnosis not present

## 2023-02-25 DIAGNOSIS — G8929 Other chronic pain: Secondary | ICD-10-CM | POA: Diagnosis not present

## 2023-02-25 DIAGNOSIS — R7303 Prediabetes: Secondary | ICD-10-CM | POA: Diagnosis not present

## 2023-02-25 DIAGNOSIS — I2583 Coronary atherosclerosis due to lipid rich plaque: Secondary | ICD-10-CM | POA: Diagnosis not present

## 2023-03-03 ENCOUNTER — Ambulatory Visit: Payer: Medicare Other | Admitting: Adult Health

## 2023-03-03 ENCOUNTER — Telehealth: Payer: Self-pay | Admitting: Pharmacist

## 2023-03-03 ENCOUNTER — Encounter: Payer: Self-pay | Admitting: Adult Health

## 2023-03-03 VITALS — BP 140/70 | HR 73 | Temp 97.8°F | Ht 71.0 in | Wt 275.8 lb

## 2023-03-03 DIAGNOSIS — I2694 Multiple subsegmental pulmonary emboli without acute cor pulmonale: Secondary | ICD-10-CM | POA: Diagnosis not present

## 2023-03-03 DIAGNOSIS — J9611 Chronic respiratory failure with hypoxia: Secondary | ICD-10-CM | POA: Diagnosis not present

## 2023-03-03 DIAGNOSIS — I2510 Atherosclerotic heart disease of native coronary artery without angina pectoris: Secondary | ICD-10-CM

## 2023-03-03 DIAGNOSIS — G4733 Obstructive sleep apnea (adult) (pediatric): Secondary | ICD-10-CM

## 2023-03-03 DIAGNOSIS — Z9861 Coronary angioplasty status: Secondary | ICD-10-CM

## 2023-03-03 DIAGNOSIS — J438 Other emphysema: Secondary | ICD-10-CM | POA: Diagnosis not present

## 2023-03-03 MED ORDER — BREZTRI AEROSPHERE 160-9-4.8 MCG/ACT IN AERO: 2.0000 | INHALATION_SPRAY | Freq: Two times a day (BID) | RESPIRATORY_TRACT | 5 refills | Status: DC

## 2023-03-03 MED ORDER — BREZTRI AEROSPHERE 160-9-4.8 MCG/ACT IN AERO: 2.0000 | INHALATION_SPRAY | Freq: Two times a day (BID) | RESPIRATORY_TRACT | 0 refills | Status: DC

## 2023-03-03 NOTE — Assessment & Plan Note (Signed)
History of PE in August 2022-we discussed at last visit this most likely was provoked as he had extensive car travel however he has multiple risk factors including sedentary lifestyle, obesity and chronic lower extremity edema.  Will continue on lifelong therapy.  At this time he is doing well on current dose.  Could consider low-dose Eliquis going forward if necessary.  Plan  Patient Instructions  Stop Symbicort , Try Breztri 2 puffs Twice daily  , rinse after use .  Albuterol inhaler As needed   Activity as tolerated.    Continue on Eliquis 5mg  Twice daily  Avoid NSAIDS- no advil, motrin or aleve.   Follow up with cardiology this week as planned.   Do not drive if sleepy .  Work on healthy weight loss.   Follow up with Dr. Wynona Neat or Bao Bazen NP in 3-4 months with spirometry with [facility] and As needed    '
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
## 2023-03-03 NOTE — Assessment & Plan Note (Signed)
No desaturations on ambulation today in the office.  Patient discontinued his oxygen earlier this year due to poor coverage from insurance..  Patient does have underlying sleep apnea declines CPAP therapy.  Will not be covered for nocturnal oxygen. Patient declines CPAP or BiPAP therapy.  Plan  Patient Instructions  Stop Symbicort , Try Breztri 2 puffs Twice daily  , rinse after use .  Albuterol inhaler As needed   Activity as tolerated.    Continue on Eliquis 5mg  Twice daily  Avoid NSAIDS- no advil, motrin or aleve.   Follow up with cardiology this week as planned.   Do not drive if sleepy .  Work on healthy weight loss.   Follow up with Dr. Wynona Neat or Isabellamarie Randa NP in 3-4 months with spirometry with DLCO and As needed    '

## 2023-03-03 NOTE — Assessment & Plan Note (Signed)
History of coronary artery disease and congestive heart failure.-Continue follow-up with cardiology as planned later this week.

## 2023-03-03 NOTE — Assessment & Plan Note (Signed)
BiPAP intolerance.  Patient declined CPAP or BiPAP.  Advised on weight loss and positional sleep.

## 2023-03-03 NOTE — Assessment & Plan Note (Signed)
Moderate COPD with emphysema.  Patient with some increased symptom burden with shortness of breath and intermittent cough.  Does use albuterol twice daily.  Will change Symbicort to [facility] to see if improved symptom burden. Chest x-ray last month was unrevealing.  He does not qualify for the lung cancer CT screening program as he quit smoking greater than 20 years ago.  Check spirometry with DLCO on return visit  Plan  Patient Instructions  Stop Symbicort , Try Breztri 2 puffs Twice daily  , rinse after use .  Albuterol inhaler As needed   Activity as tolerated.    Continue on Eliquis 5mg  Twice daily  Avoid NSAIDS- no advil, motrin or aleve.   Follow up with cardiology this week as planned.   Do not drive if sleepy .  Work on healthy weight loss.   Follow up with Dr. Wynona Neat or Kinzley Savell NP in 3-4 months with spirometry with DLCO and As needed

## 2023-03-03 NOTE — Patient Instructions (Addendum)
Stop Symbicort , Try Breztri 2 puffs Twice daily  , rinse after use .  Albuterol inhaler As needed   Activity as tolerated.    Continue on Eliquis 5mg  Twice daily  Avoid NSAIDS- no advil, motrin or aleve.   Follow up with cardiology this week as planned.   Do not drive if sleepy .  Work on healthy weight loss.   Follow up with Dr. Wynona Neat or Dalynn Jhaveri NP in 3-4 months with spirometry with DLCO and As needed

## 2023-03-03 NOTE — Progress Notes (Signed)
Care Management & Coordination Services Pharmacy Team  Reason for Encounter: Appointment Reminder  Contacted patient to confirm in office appointment with Erskine Emery, PharmD on 03/07/2023 at 10 am. Spoke with patient on 03/03/2023   Do you have any problems getting your medications? Yes, patient states he is unable to afford Ozempic and Repatha. If yes what types of problems are you experiencing? Financial barriers  What is your top health concern you would like to discuss at your upcoming visit? Heart and Pulmonary problems per patient.  Have you seen any other providers since your last visit with PCP? No   Chart review:  Recent office visits:  01/20/2023 OV (PCP) Lula Olszewski, MD; If I can get agreement on these suspected diagnosis from cardiologist I can probably add spironolactone to help with the orthopnea and Jardiance as well. Please check with the pharmacy about why we do not have Ozempic and let me know what I need to do. We have a prior Auth team that can help with prior authorizations if that is needed.   12/30/2022 OV (PCP) Lula Olszewski, MD; no medication changes indicated.  Recent consult visits:  None  Hospital visits:  None in previous 6 months   Star Rating Drugs:  Metformin 500 mg last filled 12/30/2022 90 DS Olmesartan-HCTZ 20-12.5 mg last filled 03/26/2022 90 DS Rosuvastatin 40 mg last filled 04/19/2022 90 DS   Care Gaps: Annual wellness visit in last year? Yes  If Diabetic: Last eye exam / retinopathy screening: Last diabetic foot exam:   Future Appointments  Date Time Provider Department Center  03/03/2023 10:30 AM Parrett, Virgel Bouquet, NP [facility] None  03/06/2023 10:30 AM Carlos Levering, NP [facility] None  03/07/2023 10:00 AM Erroll Luna, RPH [facility] None  02/03/2024  7:45 AM [facility] ANNUAL WELLNESS VISIT 1 [facility]   April D Calhoun, [facility] Clinical Pharmacist Assistant [phone]

## 2023-03-03 NOTE — Assessment & Plan Note (Signed)
>>  ASSESSMENT AND PLAN FOR CAD S/P PCI WRITTEN ON 03/03/2023  4:36 PM BY PARRETT, TAMMY S, NP  History of coronary artery disease and congestive heart failure.-Continue follow-up with cardiology as planned later this week.

## 2023-03-04 DIAGNOSIS — M545 Low back pain, unspecified: Secondary | ICD-10-CM | POA: Diagnosis not present

## 2023-03-04 DIAGNOSIS — G8929 Other chronic pain: Secondary | ICD-10-CM | POA: Diagnosis not present

## 2023-03-04 DIAGNOSIS — B351 Tinea unguium: Secondary | ICD-10-CM | POA: Diagnosis not present

## 2023-03-04 DIAGNOSIS — M19012 Primary osteoarthritis, left shoulder: Secondary | ICD-10-CM | POA: Diagnosis not present

## 2023-03-04 DIAGNOSIS — K449 Diaphragmatic hernia without obstruction or gangrene: Secondary | ICD-10-CM | POA: Diagnosis not present

## 2023-03-04 DIAGNOSIS — M170 Bilateral primary osteoarthritis of knee: Secondary | ICD-10-CM | POA: Diagnosis not present

## 2023-03-04 DIAGNOSIS — I2583 Coronary atherosclerosis due to lipid rich plaque: Secondary | ICD-10-CM | POA: Diagnosis not present

## 2023-03-04 DIAGNOSIS — I10 Essential (primary) hypertension: Secondary | ICD-10-CM | POA: Diagnosis not present

## 2023-03-04 DIAGNOSIS — J4540 Moderate persistent asthma, uncomplicated: Secondary | ICD-10-CM | POA: Diagnosis not present

## 2023-03-04 DIAGNOSIS — E785 Hyperlipidemia, unspecified: Secondary | ICD-10-CM | POA: Diagnosis not present

## 2023-03-04 DIAGNOSIS — H1013 Acute atopic conjunctivitis, bilateral: Secondary | ICD-10-CM | POA: Diagnosis not present

## 2023-03-04 DIAGNOSIS — M109 Gout, unspecified: Secondary | ICD-10-CM | POA: Diagnosis not present

## 2023-03-04 DIAGNOSIS — G4733 Obstructive sleep apnea (adult) (pediatric): Secondary | ICD-10-CM | POA: Diagnosis not present

## 2023-03-04 DIAGNOSIS — R7303 Prediabetes: Secondary | ICD-10-CM | POA: Diagnosis not present

## 2023-03-04 DIAGNOSIS — J4489 Other specified chronic obstructive pulmonary disease: Secondary | ICD-10-CM | POA: Diagnosis not present

## 2023-03-04 NOTE — Progress Notes (Unsigned)
Cardiology Clinic Note   Date: 03/06/2023 ID: QUINTAVIUS CARY, DOB 1946/09/17, MRN [id number]  Primary Cardiologist:  Nanetta Batty, MD  Patient Profile    Allen [name] is a 77 y.o. male who presents to the clinic today for 1 year follow-up.  Past medical history significant for: CAD. LHC 11/21/1999 (abnormal stress test): LM with mild irregularities.  Proximal LAD 20 to 30%.  Mild irregularities ramus intermedius.  Proximal AV circumflex 20 to 30%.  Proximal RCA 30 to 40%.  Mid RCA 30%. LHC 04/27/2014 (unstable angina): Minimal nonobstructive CAD in the LAD and LCx.  Mild to moderate nonobstructive CAD RCA. LHC 12/27/2019 (NSTEMI): Three-vessel CAD including 40 to 50% mid LAD, sequential 40% and 80% proximal and mid LCx, chronic total occlusion mid RCA with left-to-right collaterals via the apical LAD.  Moderately elevated LVEDP.  PCI with DES to mid LCx. HFmrEF. Echo 05/24/2021: EF 45 to 50%.  Global hypokinesis.  Grade I DD.  Normal RV function.  Mild aortic valve sclerosis without stenosis. Carotid artery disease. Carotid ultrasound 12/15/2017: 1 to 39% bilateral ICA.  Left subclavian artery was stenotic with disturbed blood flow.  Normal blood flow right subclavian artery.  Refer to vascular surgery.   Left subclavian artery stenosis. Arterial ultrasound left upper extremity 12/04/2018: No plaque visualized in the left upper extremity.  Possible hyperemic flow in the distal arteries (radial and ulnar).  Increased velocity in the proximal subclavian artery, limited visualization. Pulmonary embolus. CTA chest (PE protocol) 05/24/2021: Acute bilateral segmental to subsegmental PE with small right middle lobe infarct. Hypertension. Hyperlipidemia. Lipid panel 12/31/2022: LDL 89, HDL 38, TG 206, total 147. COPD. OSA.   History of Present Illness    Allen [name] was first evaluated by Dr. Allyson Sabal on 12/04/2016 for chest pain at the request of Dr. Beverely Low.  He had undergone 2 previous cardiac  catheterization showing nonobstructive CAD.  He underwent nuclear stress testing which showed inferior ischemia (possibly also small inferior basal scar) with abnormal EKG response to exercise indicative of more extensive disease.  In follow-up it was decided cardiac catheterization was not indicated at that time and opted for close follow-up.  It appears he was lost in follow-up until he presented to the ED 12/26/2019 and was found to have an NSTEMI for which he underwent PCI with DES to mid LCx.  He continues to be followed for the above outlined history.  He was last seen in the office by Edd Fabian, NP on 05/06/2022.  He was doing well at that visit and reported using less as needed SL NTG.  He was awaiting approval for Repatha.  No medication changes were made at that time.  Today, patient is here alone. He reports central chest "warmth" that will occasionally radiate to left shoulder with heavy exertion and resolves within 5 minutes of taking NTG and does not return. Occurs 1-2 times a week for the last several years. It has not changed in quality or frequency. It does not occur with normal activities. He reports chronic lower extremity that is best in the AM and is well managed with compression socks and Lasix as needed. He does not weigh every day. His activity is limited by bilateral knee pain. He ambulates with a single point cane. He does pool exercises in the summer. His PCP tried to get him started on Ozempic to help with weight loss but his insurance denied because he is not a diabetic. He denies blood in stool or urine  or other bleeding concerns.     ROS: All other systems reviewed and are otherwise negative except as noted in History of Present Illness.  Studies Reviewed    ECG personally reviewed by me today: Sinus bradycardia, 58 bpm.  No significant changes from 08/21/2021.           Physical Exam    VS:  BP 132/72 (BP Location: Left Arm, Patient Position: Sitting, Cuff Size:  Normal)   Pulse (!) 58   Ht 5\' 11"  (1.803 m)   Wt 268 lb 9.6 oz (121.8 kg)   SpO2 95%   BMI 37.46 kg/m  , BMI Body mass index is 37.46 kg/m.  GEN: Well nourished, well developed, in no acute distress. Neck: No JVD or carotid bruits. Cardiac:  RRR. No murmurs. No rubs or gallops.   Respiratory:  Respirations regular and unlabored. Clear to auscultation without rales, wheezing or rhonchi. GI: Soft, nontender, nondistended. Extremities: Radials/DP/PT 2+ and equal bilaterally. No clubbing or cyanosis. Trace edema bilateral lower extremities.   Skin: Warm and dry, no rash. Neuro: Strength intact.  Assessment & Plan    CAD.  S/P PCI with DES to mid LCx.  Patient reports central chest "warmth" that occasionally radiates to left shoulder with heavy exertion that resolves within 5 minutes of taking NTG and will not return. Occurs 1-2 times a week for the last several years and is unchanged in quality or frequency. Will increase isosorbide to 60 mg daily. Discussed contacting the office if frequency or quality of sensation change. Provided ED precautions. He verbalized understanding. Continue aspirin, metoprolol, rosuvastatin (see #4), Repatha, as needed SL NTG. Patient's PCP told him Ozempic may be approved if prescribed by cardiology. Pharm D states Ozempic is not an option as patient is not a diabetic. Will try for [facility]. Pharm D to start PA.  HFmrEF.  Echo August 2022 showed EF 45 to 50%, Grade I DD.  Patient reports chronic lower extremity edema best in the AM and well managed with compression socks and Lasix as needed. He does not weigh daily. Euvolemic and well compensated on exam. Discussed prn dosing of Lasix based on weight. He will begin weighing daily. Continue metoprolol, isosorbide (see #1), Benicar. Hypertension. BP today 132/72. Patient denies headaches, dizziness or vision changes. Continue metoprolol, Benicar. Hyperlipidemia.  LDL March 2024 89, not at goal. Patient is wiling to  restart rosuvastatin at a lower dose to see if he can tolerate it. He will begin rosuvastatin 20 mg. He will contact the office if it causes myalgias. May start Zetia if he cannot tolerate lower dose of rosuvastatin.  Continue Repatha.  Will repeat lipid panel and LFTs in 10-12 weeks.  Carotid artery disease/left subclavian artery stenosis.  Carotid ultrasound February 2019 1 to 39% bilateral ICA.  Arterial ultrasound left upper extremity February 2020 showed no plaque in the left upper extremity, possible hyperemic flow in the distal arteries (radial/ulnar).  Continue aspirin, rosuvastatin, Repatha.  Patient is followed by vascular surgery. PE.  CTA chest August 2022 showed acute bilateral segmental to subsegmental PE with small right middle lobe infarct.  Patient denies spontaneous bleeding concerns.  Continue Eliquis per pulmonology.  Disposition: Increase isosorbide to 60 mg daily. Renew prn SL NTG. Try rosuvastatin 20 mg. Repeat lipid panel and LFTs in 10-12 weeks. ZOXWRU PA started by Pharm D.          Signed, Etta Grandchild. Deijah Spikes, DNP, NP-C

## 2023-03-06 ENCOUNTER — Ambulatory Visit: Payer: Medicare Other | Attending: Student | Admitting: Student

## 2023-03-06 ENCOUNTER — Telehealth: Payer: Self-pay | Admitting: Pharmacist

## 2023-03-06 ENCOUNTER — Encounter: Payer: Self-pay | Admitting: Internal Medicine

## 2023-03-06 ENCOUNTER — Encounter: Payer: Self-pay | Admitting: Student

## 2023-03-06 ENCOUNTER — Other Ambulatory Visit (HOSPITAL_COMMUNITY): Payer: Self-pay

## 2023-03-06 VITALS — BP 132/72 | HR 58 | Ht 71.0 in | Wt 268.6 lb

## 2023-03-06 DIAGNOSIS — K449 Diaphragmatic hernia without obstruction or gangrene: Secondary | ICD-10-CM | POA: Diagnosis not present

## 2023-03-06 DIAGNOSIS — I25118 Atherosclerotic heart disease of native coronary artery with other forms of angina pectoris: Secondary | ICD-10-CM

## 2023-03-06 DIAGNOSIS — G4733 Obstructive sleep apnea (adult) (pediatric): Secondary | ICD-10-CM | POA: Diagnosis not present

## 2023-03-06 DIAGNOSIS — E785 Hyperlipidemia, unspecified: Secondary | ICD-10-CM

## 2023-03-06 DIAGNOSIS — M545 Low back pain, unspecified: Secondary | ICD-10-CM | POA: Diagnosis not present

## 2023-03-06 DIAGNOSIS — I5022 Chronic systolic (congestive) heart failure: Secondary | ICD-10-CM | POA: Diagnosis not present

## 2023-03-06 DIAGNOSIS — I6523 Occlusion and stenosis of bilateral carotid arteries: Secondary | ICD-10-CM

## 2023-03-06 DIAGNOSIS — M19012 Primary osteoarthritis, left shoulder: Secondary | ICD-10-CM | POA: Diagnosis not present

## 2023-03-06 DIAGNOSIS — J4489 Other specified chronic obstructive pulmonary disease: Secondary | ICD-10-CM | POA: Diagnosis not present

## 2023-03-06 DIAGNOSIS — R7303 Prediabetes: Secondary | ICD-10-CM | POA: Diagnosis not present

## 2023-03-06 DIAGNOSIS — I2583 Coronary atherosclerosis due to lipid rich plaque: Secondary | ICD-10-CM | POA: Diagnosis not present

## 2023-03-06 DIAGNOSIS — I5020 Unspecified systolic (congestive) heart failure: Secondary | ICD-10-CM | POA: Insufficient documentation

## 2023-03-06 DIAGNOSIS — M109 Gout, unspecified: Secondary | ICD-10-CM | POA: Diagnosis not present

## 2023-03-06 DIAGNOSIS — I10 Essential (primary) hypertension: Secondary | ICD-10-CM | POA: Diagnosis not present

## 2023-03-06 DIAGNOSIS — G8929 Other chronic pain: Secondary | ICD-10-CM | POA: Diagnosis not present

## 2023-03-06 DIAGNOSIS — I2694 Multiple subsegmental pulmonary emboli without acute cor pulmonale: Secondary | ICD-10-CM

## 2023-03-06 DIAGNOSIS — I771 Stricture of artery: Secondary | ICD-10-CM

## 2023-03-06 DIAGNOSIS — H1013 Acute atopic conjunctivitis, bilateral: Secondary | ICD-10-CM | POA: Diagnosis not present

## 2023-03-06 DIAGNOSIS — M170 Bilateral primary osteoarthritis of knee: Secondary | ICD-10-CM | POA: Diagnosis not present

## 2023-03-06 DIAGNOSIS — B351 Tinea unguium: Secondary | ICD-10-CM | POA: Diagnosis not present

## 2023-03-06 DIAGNOSIS — I6529 Occlusion and stenosis of unspecified carotid artery: Secondary | ICD-10-CM | POA: Insufficient documentation

## 2023-03-06 DIAGNOSIS — J4540 Moderate persistent asthma, uncomplicated: Secondary | ICD-10-CM | POA: Diagnosis not present

## 2023-03-06 MED ORDER — NITROGLYCERIN 0.4 MG SL SUBL: SUBLINGUAL_TABLET | SUBLINGUAL | 3 refills | Status: DC

## 2023-03-06 MED ORDER — ISOSORBIDE MONONITRATE ER 60 MG PO TB24
60.0000 mg | ORAL_TABLET | Freq: Every day | ORAL | 3 refills | Status: DC
Start: 2023-03-06 — End: 2024-03-08

## 2023-03-06 MED ORDER — WEGOVY 0.25 MG/0.5ML ~~LOC~~ SOAJ
0.2500 mg | SUBCUTANEOUS | 0 refills | Status: DC
Start: 2023-03-06 — End: 2023-04-09
  Filled 2023-03-06: qty 2, 28d supply, fill #0

## 2023-03-06 MED ORDER — ROSUVASTATIN CALCIUM 20 MG PO TABS
20.0000 mg | ORAL_TABLET | Freq: Every day | ORAL | 3 refills | Status: AC
Start: 2023-03-06 — End: ?

## 2023-03-06 NOTE — Addendum Note (Signed)
Addended by: Cheree Ditto on: 03/06/2023 05:08 PM   Modules accepted: Orders

## 2023-03-06 NOTE — Patient Instructions (Signed)
Medication Instructions:  Your physician has recommended you make the following change in your medication:  INCREASE: Imdur 60mg  daily  RESTART: Crestor 20mg  daily  *If you need a refill on your cardiac medications before your next appointment, please call your pharmacy*  Please weigh yourself daily, If you gain 3lbs overnight or 5lbs in a week please give the office a call.    Lab Work: Your physician recommends that you return in 10-12 weeks to have the following labs drawn: Lipid panel and LFTs  If you have labs (blood work) drawn today and your tests are completely normal, you will receive your results only by: MyChart Message (if you have MyChart) OR A paper copy in the mail If you have any lab test that is abnormal or we need to change your treatment, we will call you to review the results.   Testing/Procedures: NONE   Follow-Up: At [facility], you and your health needs are our priority.  As part of our continuing mission to provide you with exceptional heart care, we have created designated Provider Care Teams.  These Care Teams include your primary Cardiologist (physician) and Advanced Practice Providers (APPs -  Physician Assistants and Nurse Practitioners) who all work together to provide you with the care you need, when you need it.  We recommend signing up for the patient portal called "MyChart".  Sign up information is provided on this After Visit Summary.  MyChart is used to connect with patients for Virtual Visits (Telemedicine).  Patients are able to view lab/test results, encounter notes, upcoming appointments, etc.  Non-urgent messages can be sent to your provider as well.   To learn more about what you can do with MyChart, go to [email or web address].    Your next appointment:   6 month(s)  Provider:   Nanetta Batty, MD

## 2023-03-06 NOTE — Telephone Encounter (Signed)
Called and spoke with patient. Advised of [facility] approval. Explained dosing and administration and possible adverse effects. Rx sent to [facility]

## 2023-03-06 NOTE — Telephone Encounter (Signed)
PA for [facility] approved on [employer] medicare through 03/05/24

## 2023-03-06 NOTE — Telephone Encounter (Signed)
Per Carlos Levering, please complete PA for [facility].  Key:  Aldona Bar

## 2023-03-07 ENCOUNTER — Other Ambulatory Visit (HOSPITAL_COMMUNITY): Payer: Self-pay

## 2023-03-07 ENCOUNTER — Encounter: Payer: Medicare Other | Admitting: Pharmacist

## 2023-03-08 ENCOUNTER — Other Ambulatory Visit (HOSPITAL_COMMUNITY): Payer: Self-pay

## 2023-03-10 ENCOUNTER — Telehealth: Payer: Self-pay | Admitting: Internal Medicine

## 2023-03-10 NOTE — Telephone Encounter (Signed)
Patient dropped off document Home Health Certificate (Order ID [id number]), to be filled out by provider. Patient requested to send it via Fax within 5-days. Document is located in providers tray at front office.

## 2023-03-11 DIAGNOSIS — M542 Cervicalgia: Secondary | ICD-10-CM | POA: Diagnosis not present

## 2023-03-11 DIAGNOSIS — Z87891 Personal history of nicotine dependence: Secondary | ICD-10-CM | POA: Diagnosis not present

## 2023-03-11 DIAGNOSIS — Z7951 Long term (current) use of inhaled steroids: Secondary | ICD-10-CM | POA: Diagnosis not present

## 2023-03-11 DIAGNOSIS — R7303 Prediabetes: Secondary | ICD-10-CM | POA: Diagnosis not present

## 2023-03-11 DIAGNOSIS — M170 Bilateral primary osteoarthritis of knee: Secondary | ICD-10-CM | POA: Diagnosis not present

## 2023-03-11 DIAGNOSIS — H1013 Acute atopic conjunctivitis, bilateral: Secondary | ICD-10-CM | POA: Diagnosis not present

## 2023-03-11 DIAGNOSIS — Z6839 Body mass index (BMI) 39.0-39.9, adult: Secondary | ICD-10-CM | POA: Diagnosis not present

## 2023-03-11 DIAGNOSIS — Z86711 Personal history of pulmonary embolism: Secondary | ICD-10-CM | POA: Diagnosis not present

## 2023-03-11 DIAGNOSIS — K449 Diaphragmatic hernia without obstruction or gangrene: Secondary | ICD-10-CM | POA: Diagnosis not present

## 2023-03-11 DIAGNOSIS — Z7982 Long term (current) use of aspirin: Secondary | ICD-10-CM | POA: Diagnosis not present

## 2023-03-11 DIAGNOSIS — B351 Tinea unguium: Secondary | ICD-10-CM | POA: Diagnosis not present

## 2023-03-11 DIAGNOSIS — M109 Gout, unspecified: Secondary | ICD-10-CM | POA: Diagnosis not present

## 2023-03-11 DIAGNOSIS — E785 Hyperlipidemia, unspecified: Secondary | ICD-10-CM | POA: Diagnosis not present

## 2023-03-11 DIAGNOSIS — Z7984 Long term (current) use of oral hypoglycemic drugs: Secondary | ICD-10-CM | POA: Diagnosis not present

## 2023-03-11 DIAGNOSIS — G8929 Other chronic pain: Secondary | ICD-10-CM | POA: Diagnosis not present

## 2023-03-11 DIAGNOSIS — J4489 Other specified chronic obstructive pulmonary disease: Secondary | ICD-10-CM | POA: Diagnosis not present

## 2023-03-11 DIAGNOSIS — M19012 Primary osteoarthritis, left shoulder: Secondary | ICD-10-CM | POA: Diagnosis not present

## 2023-03-11 DIAGNOSIS — G4733 Obstructive sleep apnea (adult) (pediatric): Secondary | ICD-10-CM | POA: Diagnosis not present

## 2023-03-11 DIAGNOSIS — Z7901 Long term (current) use of anticoagulants: Secondary | ICD-10-CM | POA: Diagnosis not present

## 2023-03-11 DIAGNOSIS — I2583 Coronary atherosclerosis due to lipid rich plaque: Secondary | ICD-10-CM | POA: Diagnosis not present

## 2023-03-11 DIAGNOSIS — I10 Essential (primary) hypertension: Secondary | ICD-10-CM | POA: Diagnosis not present

## 2023-03-11 DIAGNOSIS — M545 Low back pain, unspecified: Secondary | ICD-10-CM | POA: Diagnosis not present

## 2023-03-11 DIAGNOSIS — Z8673 Personal history of transient ischemic attack (TIA), and cerebral infarction without residual deficits: Secondary | ICD-10-CM | POA: Diagnosis not present

## 2023-03-11 DIAGNOSIS — J4540 Moderate persistent asthma, uncomplicated: Secondary | ICD-10-CM | POA: Diagnosis not present

## 2023-03-13 ENCOUNTER — Other Ambulatory Visit: Payer: Self-pay | Admitting: Family Medicine

## 2023-03-13 DIAGNOSIS — J438 Other emphysema: Secondary | ICD-10-CM

## 2023-03-14 DIAGNOSIS — M542 Cervicalgia: Secondary | ICD-10-CM | POA: Diagnosis not present

## 2023-03-14 DIAGNOSIS — J4540 Moderate persistent asthma, uncomplicated: Secondary | ICD-10-CM | POA: Diagnosis not present

## 2023-03-14 DIAGNOSIS — M170 Bilateral primary osteoarthritis of knee: Secondary | ICD-10-CM | POA: Diagnosis not present

## 2023-03-14 DIAGNOSIS — G8929 Other chronic pain: Secondary | ICD-10-CM | POA: Diagnosis not present

## 2023-03-14 DIAGNOSIS — M109 Gout, unspecified: Secondary | ICD-10-CM | POA: Diagnosis not present

## 2023-03-14 DIAGNOSIS — J4489 Other specified chronic obstructive pulmonary disease: Secondary | ICD-10-CM | POA: Diagnosis not present

## 2023-03-14 DIAGNOSIS — K449 Diaphragmatic hernia without obstruction or gangrene: Secondary | ICD-10-CM | POA: Diagnosis not present

## 2023-03-14 DIAGNOSIS — H1013 Acute atopic conjunctivitis, bilateral: Secondary | ICD-10-CM | POA: Diagnosis not present

## 2023-03-14 DIAGNOSIS — B351 Tinea unguium: Secondary | ICD-10-CM | POA: Diagnosis not present

## 2023-03-14 DIAGNOSIS — E785 Hyperlipidemia, unspecified: Secondary | ICD-10-CM | POA: Diagnosis not present

## 2023-03-14 DIAGNOSIS — M19012 Primary osteoarthritis, left shoulder: Secondary | ICD-10-CM | POA: Diagnosis not present

## 2023-03-14 DIAGNOSIS — I10 Essential (primary) hypertension: Secondary | ICD-10-CM | POA: Diagnosis not present

## 2023-03-14 DIAGNOSIS — M545 Low back pain, unspecified: Secondary | ICD-10-CM | POA: Diagnosis not present

## 2023-03-14 DIAGNOSIS — R7303 Prediabetes: Secondary | ICD-10-CM | POA: Diagnosis not present

## 2023-03-14 DIAGNOSIS — G4733 Obstructive sleep apnea (adult) (pediatric): Secondary | ICD-10-CM | POA: Diagnosis not present

## 2023-03-14 DIAGNOSIS — I2583 Coronary atherosclerosis due to lipid rich plaque: Secondary | ICD-10-CM | POA: Diagnosis not present

## 2023-03-15 ENCOUNTER — Other Ambulatory Visit: Payer: Self-pay | Admitting: Cardiovascular Disease

## 2023-03-15 DIAGNOSIS — E785 Hyperlipidemia, unspecified: Secondary | ICD-10-CM

## 2023-03-15 DIAGNOSIS — I2510 Atherosclerotic heart disease of native coronary artery without angina pectoris: Secondary | ICD-10-CM

## 2023-03-18 ENCOUNTER — Encounter: Payer: Medicare Other | Admitting: Pharmacist

## 2023-03-18 NOTE — Progress Notes (Unsigned)
Care Management & Coordination Services Pharmacy Note  03/18/2023 Name:  Allen [name] MRN:  [id number] DOB:  20-Dec-1945  Summary: ***  Recommendations/Changes made from today's visit: ***  Follow up plan: ***   Subjective: Allen [name] is an 77 y.o. year old male who is a primary patient of Lula Olszewski, MD.  The care coordination team was consulted for assistance with disease management and care coordination needs.    Engaged with patient face to face for initial visit.  Recent office visits:  01/20/2023 OV (PCP) Lula Olszewski, MD; If I can get agreement on these suspected diagnosis from cardiologist I can probably add spironolactone to help with the orthopnea and Jardiance as well. Please check with the pharmacy about why we do not have Ozempic and let me know what I need to do. We have a prior Auth team that can help with prior authorizations if that is needed.    12/30/2022 OV (PCP) Lula Olszewski, MD; no medication changes indicated.   Recent consult visits:  None   Hospital visits:  None in previous 6 months   Objective:  Lab Results  Component Value Date   CREATININE 0.96 12/31/2022   BUN 21 12/31/2022   GFR 77.98 12/31/2022   EGFR 74 05/06/2022   GFRNONAA >60 05/25/2021   GFRAA >60 12/28/2019   NA 138 12/31/2022   K 4.6 12/31/2022   CALCIUM 9.8 12/31/2022   CO2 27 12/31/2022   GLUCOSE 104 (H) 12/31/2022    Lab Results  Component Value Date/Time   HGBA1C 6.0 12/31/2022 10:42 AM   HGBA1C 5.8 (H) 05/04/2021 02:47 PM   GFR 77.98 12/31/2022 10:42 AM   GFR 74.92 07/26/2020 09:55 AM    Last diabetic Eye exam: No results found for: "HMDIABEYEEXA"  Last diabetic Foot exam: No results found for: "HMDIABFOOTEX"   Lab Results  Component Value Date   CHOL 147 12/31/2022   HDL 38.30 (L) 12/31/2022   LDLCALC 112 (H) 04/09/2022   LDLDIRECT 89.0 12/31/2022   TRIG 206.0 (H) 12/31/2022   CHOLHDL 4 12/31/2022       Latest Ref Rng & Units  12/31/2022   10:42 AM 05/23/2021    7:43 PM 05/04/2021    2:47 PM  Hepatic Function  Total Protein 6.0 - 8.3 g/dL 7.5  7.8  7.7   Albumin 3.5 - 5.2 g/dL 4.2  4.1    AST 0 - 37 U/L 16  17  14    ALT 0 - 53 U/L 18  17  15    Alk Phosphatase 39 - 117 U/L 83  70    Total Bilirubin 0.2 - 1.2 mg/dL 0.4  0.7  0.5     Lab Results  Component Value Date/Time   TSH 2.10 12/31/2022 10:42 AM   TSH 2.24 07/26/2020 09:55 AM   FREET4 0.73 04/29/2014 11:13 AM       Latest Ref Rng & Units 12/31/2022   10:42 AM 05/25/2021    1:01 AM 05/23/2021    7:43 PM  CBC  WBC 4.0 - 10.5 K/uL 7.3  12.3  13.6   Hemoglobin 13.0 - 17.0 g/dL 40.9  81.1  91.4   Hematocrit 39.0 - 52.0 % 43.5  39.7  43.2   Platelets 150.0 - 400.0 K/uL 260.0  231  263     Lab Results  Component Value Date/Time   VITAMINB12 191 (L) 12/31/2022 10:42 AM    Clinical ASCVD: {YES/NO:21197} The ASCVD Risk score (Arnett DK,  et al., 2019) failed to calculate for the following reasons:   The patient has a prior MI or stroke diagnosis    ***Other: (CHADS2VASc if Afib, MMRC or CAT for COPD, ACT, DEXA)     01/28/2023    8:07 AM 12/30/2022    2:16 PM 06/20/2021    3:41 PM  Depression screen PHQ 2/9  Decreased Interest 0 0 0  Down, Depressed, Hopeless 0 0 0  PHQ - 2 Score 0 0 0  Altered sleeping 0 1   Tired, decreased energy 0 1   Change in appetite 0 3   Feeling bad or failure about yourself  0 0   Trouble concentrating 0 0   Moving slowly or fidgety/restless 0 3   Suicidal thoughts 0 0   PHQ-9 Score 0 8   Difficult doing work/chores Not difficult at all Somewhat difficult      Social History   Tobacco Use  Smoking Status Former   Packs/day: 2.00   Years: 35.00   Additional pack years: 0.00   Total pack years: 70.00   Types: Cigarettes   Quit date: 10/21/1997   Years since quitting: 25.4  Smokeless Tobacco Never  Tobacco Comments   2-3 ppd   BP Readings from Last 3 Encounters:  03/06/23 132/72  03/03/23 (!) 140/70   01/20/23 (!) 140/70   Pulse Readings from Last 3 Encounters:  03/06/23 (!) 58  03/03/23 73  01/20/23 63   Wt Readings from Last 3 Encounters:  03/06/23 268 lb 9.6 oz (121.8 kg)  03/03/23 275 lb 12.8 oz (125.1 kg)  01/28/23 281 lb (127.5 kg)   BMI Readings from Last 3 Encounters:  03/06/23 37.46 kg/m  03/03/23 38.47 kg/m  01/28/23 38.11 kg/m    Allergies  Allergen Reactions   Penicillins Hives and Other (See Comments)    Has patient had a PCN reaction causing immediate rash, facial/tongue/throat swelling, SOB or lightheadedness with hypotension: no Has patient had a PCN reaction causing severe rash involving mucus membranes or skin necrosis: no Has patient had a PCN reaction that required hospitalization no - childhood reaction Has patient had a PCN reaction occurring within the last 10 years: no - childhood reaction If all of the above answers are "NO", then may proceed with Cephalosporin use.    Sulfonamide Derivatives Hives   Sulfa Antibiotics Hives   Advil [Ibuprofen] Swelling and Other (See Comments)    Lip swelling   Atorvastatin Other (See Comments)    Muscle pain    Medications Reviewed Today     Reviewed by Carlos Levering, NP (Nurse Practitioner) on 03/06/23 at 1338  Med List Status: <None>   Medication Order Taking? Sig Documenting Provider Last Dose Status Informant  albuterol (VENTOLIN HFA) 108 (90 Base) MCG/ACT inhaler [id number] Yes INHALE 2 PUFFS BY MOUTH EVERY 6 HOURS AS NEEDED FOR WHEEZING OR SHORTNESS OF BREATH Sheliah Hatch, MD Taking Active   allopurinol (ZYLOPRIM) 100 MG tablet [id number] Yes Take 1 tablet (100 mg total) by mouth daily. Janeece Agee, NP Taking Active Self  apixaban (ELIQUIS) 5 MG TABS tablet [id number] Yes Take 1 tablet (5 mg total) by mouth 2 (two) times daily. Tomma Lightning, MD Taking Active   aspirin EC 81 MG EC tablet [id number] Yes Take 1 tablet (81 mg total) by mouth daily. Arty Baumgartner, NP Taking Active  Self           Med Note Valla Leaver May 24, 2021  3:47 AM) Med on hold while pt is taking colchicine  Budeson-Glycopyrrol-Formoterol (BREZTRI AEROSPHERE) 160-9-4.8 MCG/ACT AERO [id number] Yes Inhale 2 puffs into the lungs in the morning and at bedtime. Parrett, Virgel Bouquet, NP Taking Active   Budeson-Glycopyrrol-Formoterol (BREZTRI AEROSPHERE) 160-9-4.8 MCG/ACT AERO [id number] Yes Inhale 2 puffs into the lungs in the morning and at bedtime. Parrett, Virgel Bouquet, NP Taking Active   ciclopirox (LOPROX) 0.77 % cream [id number] Yes Apply topically 2 (two) times daily. Lula Olszewski, MD Taking Active   colchicine 0.6 MG tablet [id number] Yes Take 1 tablet (0.6 mg total) by mouth daily. Until flare resolves. Do not take rosuvastatin or aspirin when taking this medication. Janeece Agee, NP Taking Active Self  cyanocobalamin (VITAMIN B12) 1000 MCG tablet [id number] Yes Take 1 tablet (1,000 mcg total) by mouth daily. Lula Olszewski, MD Taking Active   Evolocumab [facility] SURECLICK) 140 MG/ML Ivory Broad [id number] Yes Inject 1 mL into the skin every 14 (fourteen) days. Runell Gess, MD Taking Active   FLUoxetine (PROZAC) 20 MG tablet [id number] Yes Take 1 tablet (20 mg total) by mouth daily. To start: just half tablet daily for 2 weeks Lula Olszewski, MD Taking Active   furosemide (LASIX) 20 MG tablet [id number] Yes TAKE 2 TABLETS FOR 3 DAYS THEN DECREASE TO 1 TABLET Marykay Lex, MD Taking Active   isosorbide mononitrate (IMDUR) 60 MG 24 hr tablet [id number]  Take 1 tablet (60 mg total) by mouth daily. Carlos Levering, NP  Active   metFORMIN (GLUCOPHAGE) 500 MG tablet [id number] Yes Take 1 tablet (500 mg total) by mouth 2 (two) times daily with a meal. Lula Olszewski, MD Taking Active   metoprolol succinate (TOPROL-XL) 50 MG 24 hr tablet [id number] Yes TAKE 1 TABLET BY MOUTH DAILY. TAKE WITH OR IMMEDIATELY FOLLOWING A MEAL. Runell Gess, MD Taking Active   nitroGLYCERIN (NITROSTAT) 0.4 MG SL tablet  [id number]  PLACE 1 TABLET UNDER THE TONGUE EVERY 5 MINUTES AS NEEDED FOR CHEST PAIN. Carlos Levering, NP  Active   olmesartan-hydrochlorothiazide (BENICAR HCT) 20-12.5 MG tablet [id number] Yes Take 1 tablet by mouth daily. Sheliah Hatch, MD Taking Active   Olopatadine HCl (PATADAY) 0.2 % SOLN [id number] Yes Apply 1 drop to each eye once daily. Lula Olszewski, MD Taking Active   rosuvastatin (CRESTOR) 20 MG tablet [id number]  Take 1 tablet (20 mg total) by mouth daily. Carlos Levering, NP  Active   Semaglutide,0.25 or 0.5MG /DOS, (OZEMPIC, 0.25 OR 0.5 MG/DOSE,) 2 MG/3ML SOPN [id number] No Inject 0.25 mg into the skin once a week.  Patient not taking: Reported on 03/06/2023   Lula Olszewski, MD Not Taking Active   valsartan-hydrochlorothiazide Raylene Miyamoto) 160-25 MG tablet [id number] Yes  [provider] Taking Active             SDOH:  (Social Determinants of Health) assessments and interventions performed: {yes/no:20286} SDOH Interventions    Flowsheet Row Clinical Support from 01/28/2023 in [facility] Most recent reading at 01/28/2023  8:08 AM Clinical Support from 10/16/2020 in [facility] [place] Most recent reading at 10/16/2020 11:36 AM Office Visit from 06/17/2018 in [facility] [place] Most recent reading at 06/17/2018  8:53 AM Clinical Support from 06/17/2018 in [facility] [place] Most recent reading at 06/17/2018  8:28 AM  SDOH Interventions      Food Insecurity Interventions Intervention Not Indicated -- -- --  Housing Interventions Intervention Not  Indicated -- -- --  Transportation Interventions Intervention Not Indicated -- -- --  Utilities Interventions Intervention Not Indicated -- -- --  Depression Interventions/Treatment  -- -- Currently on Treatment Currently on Treatment  Financial Strain Interventions Intervention Not Indicated --  -- --  Physical Activity Interventions Intervention Not Indicated Intervention Not Indicated  [pt has set a goal to increase activity] -- --  Stress Interventions Intervention Not Indicated -- -- --  Social Connections Interventions Intervention Not Indicated -- -- --       Medication Assistance: {MEDASSISTANCEINFO:25044}  Medication Access: Name and location of current pharmacy:  [id number] - [place], [place] - [address] [place] [address] Phone: [phone] Fax: [phone]  [facility] [address] [place] [address] Phone: [phone] Fax: [phone]  Within the past 30 days, how often has patient missed a dose of medication? *** Is a pillbox or other method used to improve adherence? {YES/NO:21197} Factors that may affect medication adherence? {CHL DESC; BARRIERS:21522} Are meds synced by current pharmacy? {YES/NO:21197} Are meds delivered by current pharmacy? {YES/NO:21197} Does patient experience delays in picking up medications due to transportation concerns? {YES/NO:21197}  Compliance/Adherence/Medication fill history: Star Rating Drugs:  Metformin 500 mg last filled 12/30/2022 90 DS Olmesartan-HCTZ 20-12.5 mg last filled 03/26/2022 90 DS Rosuvastatin 40 mg last filled 04/19/2022 90 DS     Care Gaps: Annual wellness visit in last year? Yes   Assessment/Plan   Hypertension (BP goal {CHL HP UPSTREAM Pharmacist BP ranges:[phone]}) -{US controlled/uncontrolled:25276} -Current treatment: *** -Medications previously tried: ***  -Current home readings: *** -Current dietary habits: *** -Current exercise habits: *** -{ACTIONS;DENIES/REPORTS:21021675::"Denies"} hypotensive/hypertensive symptoms -Educated on {CCM BP Counseling:25124} -Counseled to monitor BP at home ***, document, and provide log at future  appointments -{CCMPHARMDINTERVENTION:25122}  Hyperlipidemia: (LDL goal < ***) -{US controlled/uncontrolled:25276} -Current treatment: Rosuvastatin 20mg  {Appropriate:26852::"Appropriate"}, {Effective:26853::"Effective"}, {Safe:26854::"Safe"}, {accessible:26855::"Accessible"} -Medications previously tried: ***  -Current dietary patterns: *** -Current exercise habits: *** -Educated on {CCM HLD Counseling:25126} -{CCMPHARMDINTERVENTION:25122}  Heart Failure (Goal: manage symptoms and prevent exacerbations) -{US controlled/uncontrolled:25276} -Last ejection fraction: *** (Date: ***) -HF type: {type of heart failure:28376} -NYHA Class: {CHL HP Upstream Pharm NYHA [phone]} -AHA HF Stage: {CHL HP Upstream Pharm AHA HF [phone]} -Current treatment: *** -Medications previously tried: ***  -Current home BP/HR readings: *** -Current home daily weights: *** -Current dietary habits: *** -Current exercise habits: *** -Educated on {CCM HF Counseling:25125} -{CCMPHARMDINTERVENTION:25122}  COPD (Goal: control symptoms and prevent exacerbations) -{US controlled/uncontrolled:25276} -Current treatment  *** -Medications previously tried: ***  -Gold Grade: {CHL HP Upstream Pharm COPD Gold QIHKV:[id number]} -Current COPD Classification:  {CHL Upstream Pharm COPD Class (Updated):28375} -MMRC/CAT score: *** -Pulmonary function testing: *** -Exacerbations requiring treatment in last 6 months: *** -Patient {Actions; denies-reports:120008} consistent use of maintenance inhaler -Frequency of rescue inhaler use: *** -Counseled on {CCMINHALERCOUNSELING:25121} -{CCMPHARMDINTERVENTION:25122}  ***

## 2023-03-21 DIAGNOSIS — M545 Low back pain, unspecified: Secondary | ICD-10-CM | POA: Diagnosis not present

## 2023-03-21 DIAGNOSIS — M170 Bilateral primary osteoarthritis of knee: Secondary | ICD-10-CM | POA: Diagnosis not present

## 2023-03-21 DIAGNOSIS — B351 Tinea unguium: Secondary | ICD-10-CM | POA: Diagnosis not present

## 2023-03-21 DIAGNOSIS — R7303 Prediabetes: Secondary | ICD-10-CM | POA: Diagnosis not present

## 2023-03-21 DIAGNOSIS — G8929 Other chronic pain: Secondary | ICD-10-CM | POA: Diagnosis not present

## 2023-03-21 DIAGNOSIS — H1013 Acute atopic conjunctivitis, bilateral: Secondary | ICD-10-CM | POA: Diagnosis not present

## 2023-03-21 DIAGNOSIS — I2583 Coronary atherosclerosis due to lipid rich plaque: Secondary | ICD-10-CM | POA: Diagnosis not present

## 2023-03-21 DIAGNOSIS — M542 Cervicalgia: Secondary | ICD-10-CM | POA: Diagnosis not present

## 2023-03-21 DIAGNOSIS — E785 Hyperlipidemia, unspecified: Secondary | ICD-10-CM | POA: Diagnosis not present

## 2023-03-21 DIAGNOSIS — G4733 Obstructive sleep apnea (adult) (pediatric): Secondary | ICD-10-CM | POA: Diagnosis not present

## 2023-03-21 DIAGNOSIS — I10 Essential (primary) hypertension: Secondary | ICD-10-CM | POA: Diagnosis not present

## 2023-03-21 DIAGNOSIS — M19012 Primary osteoarthritis, left shoulder: Secondary | ICD-10-CM | POA: Diagnosis not present

## 2023-03-21 DIAGNOSIS — K449 Diaphragmatic hernia without obstruction or gangrene: Secondary | ICD-10-CM | POA: Diagnosis not present

## 2023-03-21 DIAGNOSIS — M109 Gout, unspecified: Secondary | ICD-10-CM | POA: Diagnosis not present

## 2023-03-21 DIAGNOSIS — J4489 Other specified chronic obstructive pulmonary disease: Secondary | ICD-10-CM | POA: Diagnosis not present

## 2023-03-21 DIAGNOSIS — J4540 Moderate persistent asthma, uncomplicated: Secondary | ICD-10-CM | POA: Diagnosis not present

## 2023-03-24 ENCOUNTER — Encounter: Payer: Medicare Other | Admitting: Pharmacist

## 2023-03-24 NOTE — Progress Notes (Unsigned)
Care Management & Coordination Services Pharmacy Note  03/24/2023 Name:  Allen [name] MRN:  [id number] DOB:  Jul 16, 1946  Summary: ***  Recommendations/Changes made from today's visit: ***  Follow up plan: ***   Subjective: Allen [name] is an 77 y.o. year old male who is a primary patient of Allen Olszewski, MD.  The care coordination team was consulted for assistance with disease management and care coordination needs.    {CCMTELEPHONEFACETOFACE:[id number]} for initial visit.  Recent office visits:  01/20/2023 OV (PCP) Allen Olszewski, MD; If I can get agreement on these suspected diagnosis from cardiologist I can probably add spironolactone to help with the orthopnea and Jardiance as well. Please check with the pharmacy about why we do not have Ozempic and let me know what I need to do. We have a prior Auth team that can help with prior authorizations if that is needed.    12/30/2022 OV (PCP) Allen Olszewski, MD; no medication changes indicated.   Recent consult visits:  None   Hospital visits:  None in previous 6 months   Objective:  Lab Results  Component Value Date   CREATININE 0.96 12/31/2022   BUN 21 12/31/2022   GFR 77.98 12/31/2022   EGFR 74 05/06/2022   GFRNONAA >60 05/25/2021   GFRAA >60 12/28/2019   NA 138 12/31/2022   K 4.6 12/31/2022   CALCIUM 9.8 12/31/2022   CO2 27 12/31/2022   GLUCOSE 104 (H) 12/31/2022    Lab Results  Component Value Date/Time   HGBA1C 6.0 12/31/2022 10:42 AM   HGBA1C 5.8 (H) 05/04/2021 02:47 PM   GFR 77.98 12/31/2022 10:42 AM   GFR 74.92 07/26/2020 09:55 AM    Last diabetic Eye exam: No results found for: "HMDIABEYEEXA"  Last diabetic Foot exam: No results found for: "HMDIABFOOTEX"   Lab Results  Component Value Date   CHOL 147 12/31/2022   HDL 38.30 (L) 12/31/2022   LDLCALC 112 (H) 04/09/2022   LDLDIRECT 89.0 12/31/2022   TRIG 206.0 (H) 12/31/2022   CHOLHDL 4 12/31/2022       Latest Ref Rng & Units  12/31/2022   10:42 AM 05/23/2021    7:43 PM 05/04/2021    2:47 PM  Hepatic Function  Total Protein 6.0 - 8.3 g/dL 7.5  7.8  7.7   Albumin 3.5 - 5.2 g/dL 4.2  4.1    AST 0 - 37 U/L 16  17  14    ALT 0 - 53 U/L 18  17  15    Alk Phosphatase 39 - 117 U/L 83  70    Total Bilirubin 0.2 - 1.2 mg/dL 0.4  0.7  0.5     Lab Results  Component Value Date/Time   TSH 2.10 12/31/2022 10:42 AM   TSH 2.24 07/26/2020 09:55 AM   FREET4 0.73 04/29/2014 11:13 AM       Latest Ref Rng & Units 12/31/2022   10:42 AM 05/25/2021    1:01 AM 05/23/2021    7:43 PM  CBC  WBC 4.0 - 10.5 K/uL 7.3  12.3  13.6   Hemoglobin 13.0 - 17.0 g/dL 40.9  81.1  91.4   Hematocrit 39.0 - 52.0 % 43.5  39.7  43.2   Platelets 150.0 - 400.0 K/uL 260.0  231  263     Lab Results  Component Value Date/Time   VITAMINB12 191 (L) 12/31/2022 10:42 AM    Clinical ASCVD: Yes  The ASCVD Risk score (Arnett DK, et al., 2019) failed  to calculate for the following reasons:   The patient has a prior MI or stroke diagnosis    ***Other: (CHADS2VASc if Afib, MMRC or CAT for COPD, ACT, DEXA)     01/28/2023    8:07 AM 12/30/2022    2:16 PM 06/20/2021    3:41 PM  Depression screen PHQ 2/9  Decreased Interest 0 0 0  Down, Depressed, Hopeless 0 0 0  PHQ - 2 Score 0 0 0  Altered sleeping 0 1   Tired, decreased energy 0 1   Change in appetite 0 3   Feeling bad or failure about yourself  0 0   Trouble concentrating 0 0   Moving slowly or fidgety/restless 0 3   Suicidal thoughts 0 0   PHQ-9 Score 0 8   Difficult doing work/chores Not difficult at all Somewhat difficult      Social History   Tobacco Use  Smoking Status Former   Packs/day: 2.00   Years: 35.00   Additional pack years: 0.00   Total pack years: 70.00   Types: Cigarettes   Quit date: 10/21/1997   Years since quitting: 25.4  Smokeless Tobacco Never  Tobacco Comments   2-3 ppd   BP Readings from Last 3 Encounters:  03/06/23 132/72  03/03/23 (!) 140/70  01/20/23 (!)  140/70   Pulse Readings from Last 3 Encounters:  03/06/23 (!) 58  03/03/23 73  01/20/23 63   Wt Readings from Last 3 Encounters:  03/06/23 268 lb 9.6 oz (121.8 kg)  03/03/23 275 lb 12.8 oz (125.1 kg)  01/28/23 281 lb (127.5 kg)   BMI Readings from Last 3 Encounters:  03/06/23 37.46 kg/m  03/03/23 38.47 kg/m  01/28/23 38.11 kg/m    Allergies  Allergen Reactions   Penicillins Hives and Other (See Comments)    Has patient had a PCN reaction causing immediate rash, facial/tongue/throat swelling, SOB or lightheadedness with hypotension: no Has patient had a PCN reaction causing severe rash involving mucus membranes or skin necrosis: no Has patient had a PCN reaction that required hospitalization no - childhood reaction Has patient had a PCN reaction occurring within the last 10 years: no - childhood reaction If all of the above answers are "NO", then may proceed with Cephalosporin use.    Sulfonamide Derivatives Hives   Sulfa Antibiotics Hives   Advil [Ibuprofen] Swelling and Other (See Comments)    Lip swelling   Atorvastatin Other (See Comments)    Muscle pain    Medications Reviewed Today     Reviewed by Carlos Levering, NP (Nurse Practitioner) on 03/06/23 at 1338  Med List Status: <None>   Medication Order Taking? Sig Documenting Provider Last Dose Status Informant  albuterol (VENTOLIN HFA) 108 (90 Base) MCG/ACT inhaler [id number] Yes INHALE 2 PUFFS BY MOUTH EVERY 6 HOURS AS NEEDED FOR WHEEZING OR SHORTNESS OF BREATH Sheliah Hatch, MD Taking Active   allopurinol (ZYLOPRIM) 100 MG tablet [id number] Yes Take 1 tablet (100 mg total) by mouth daily. Janeece Agee, NP Taking Active Self  apixaban (ELIQUIS) 5 MG TABS tablet [id number] Yes Take 1 tablet (5 mg total) by mouth 2 (two) times daily. Tomma Lightning, MD Taking Active   aspirin EC 81 MG EC tablet [id number] Yes Take 1 tablet (81 mg total) by mouth daily. Arty Baumgartner, NP Taking Active Self            Med Note Felix Ahmadi D   Thu May 24, 2021  3:47 AM) Med on  hold while pt is taking colchicine  Budeson-Glycopyrrol-Formoterol (BREZTRI AEROSPHERE) 160-9-4.8 MCG/ACT AERO [id number] Yes Inhale 2 puffs into the lungs in the morning and at bedtime. Parrett, Virgel Bouquet, NP Taking Active   Budeson-Glycopyrrol-Formoterol (BREZTRI AEROSPHERE) 160-9-4.8 MCG/ACT AERO [id number] Yes Inhale 2 puffs into the lungs in the morning and at bedtime. Parrett, Virgel Bouquet, NP Taking Active   ciclopirox (LOPROX) 0.77 % cream [id number] Yes Apply topically 2 (two) times daily. Allen Olszewski, MD Taking Active   colchicine 0.6 MG tablet [id number] Yes Take 1 tablet (0.6 mg total) by mouth daily. Until flare resolves. Do not take rosuvastatin or aspirin when taking this medication. Janeece Agee, NP Taking Active Self  cyanocobalamin (VITAMIN B12) 1000 MCG tablet [id number] Yes Take 1 tablet (1,000 mcg total) by mouth daily. Allen Olszewski, MD Taking Active   Evolocumab [facility] SURECLICK) 140 MG/ML Ivory Broad [id number] Yes Inject 1 mL into the skin every 14 (fourteen) days. Runell Gess, MD Taking Active   FLUoxetine (PROZAC) 20 MG tablet [id number] Yes Take 1 tablet (20 mg total) by mouth daily. To start: just half tablet daily for 2 weeks Allen Olszewski, MD Taking Active   furosemide (LASIX) 20 MG tablet [id number] Yes TAKE 2 TABLETS FOR 3 DAYS THEN DECREASE TO 1 TABLET Marykay Lex, MD Taking Active   isosorbide mononitrate (IMDUR) 60 MG 24 hr tablet [id number]  Take 1 tablet (60 mg total) by mouth daily. Carlos Levering, NP  Active   metFORMIN (GLUCOPHAGE) 500 MG tablet [id number] Yes Take 1 tablet (500 mg total) by mouth 2 (two) times daily with a meal. Allen Olszewski, MD Taking Active   metoprolol succinate (TOPROL-XL) 50 MG 24 hr tablet [id number] Yes TAKE 1 TABLET BY MOUTH DAILY. TAKE WITH OR IMMEDIATELY FOLLOWING A MEAL. Runell Gess, MD Taking Active   nitroGLYCERIN (NITROSTAT) 0.4 MG SL tablet [id number]   PLACE 1 TABLET UNDER THE TONGUE EVERY 5 MINUTES AS NEEDED FOR CHEST PAIN. Carlos Levering, NP  Active   olmesartan-hydrochlorothiazide (BENICAR HCT) 20-12.5 MG tablet [id number] Yes Take 1 tablet by mouth daily. Sheliah Hatch, MD Taking Active   Olopatadine HCl (PATADAY) 0.2 % SOLN [id number] Yes Apply 1 drop to each eye once daily. Allen Olszewski, MD Taking Active   rosuvastatin (CRESTOR) 20 MG tablet [id number]  Take 1 tablet (20 mg total) by mouth daily. Carlos Levering, NP  Active   Semaglutide,0.25 or 0.5MG /DOS, (OZEMPIC, 0.25 OR 0.5 MG/DOSE,) 2 MG/3ML SOPN [id number] No Inject 0.25 mg into the skin once a week.  Patient not taking: Reported on 03/06/2023   Allen Olszewski, MD Not Taking Active   valsartan-hydrochlorothiazide Raylene Miyamoto) 160-25 MG tablet [id number] Yes  [provider] Taking Active             SDOH:  (Social Determinants of Health) assessments and interventions performed: {yes/no:20286} SDOH Interventions    Flowsheet Row Clinical Support from 01/28/2023 in [facility] Most recent reading at 01/28/2023  8:08 AM Clinical Support from 10/16/2020 in [facility] [place] Most recent reading at 10/16/2020 11:36 AM Office Visit from 06/17/2018 in [facility] [place] Most recent reading at 06/17/2018  8:53 AM Clinical Support from 06/17/2018 in [facility] [place] Most recent reading at 06/17/2018  8:28 AM  SDOH Interventions      Food Insecurity Interventions Intervention Not Indicated -- -- --  Housing Interventions Intervention Not Indicated -- -- --  Transportation Interventions Intervention Not Indicated -- -- --  Utilities Interventions Intervention Not Indicated -- -- --  Depression Interventions/Treatment  -- -- Currently on Treatment Currently on Treatment  Financial Strain Interventions Intervention Not Indicated -- -- --   Physical Activity Interventions Intervention Not Indicated Intervention Not Indicated  [pt has set a goal to increase activity] -- --  Stress Interventions Intervention Not Indicated -- -- --  Social Connections Interventions Intervention Not Indicated -- -- --       Medication Assistance: {MEDASSISTANCEINFO:25044}  Medication Access: Name and location of current pharmacy:  [id number] - [place], [place] - [address] [place] [address] Phone: [phone] Fax: [phone]  [place] [facility] [address] [place] [address] Phone: [phone] Fax: [phone]  Within the past 30 days, how often has patient missed a dose of medication? *** Is a pillbox or other method used to improve adherence? {YES/NO:21197} Factors that may affect medication adherence? {CHL DESC; BARRIERS:21522} Are meds synced by current pharmacy? {YES/NO:21197} Are meds delivered by current pharmacy? {YES/NO:21197} Does patient experience delays in picking up medications due to transportation concerns? {YES/NO:21197}  Compliance/Adherence/Medication fill history: Star Rating Drugs:  Metformin 500 mg last filled 12/30/2022 90 DS Olmesartan-HCTZ 20-12.5 mg last filled 03/26/2022 90 DS Rosuvastatin 40 mg last filled 04/19/2022 90 DS     Care Gaps: Annual wellness visit in last year? Yes   Assessment/Plan   Hypertension (BP goal {CHL HP UPSTREAM Pharmacist BP ranges:[phone]}) -{US controlled/uncontrolled:25276} -Current treatment: Olmesartan-HCTZ 20/12.5mg  {Appropriate:26852::"Appropriate"}, {Effective:26853::"Effective"}, {Safe:26854::"Safe"}, {accessible:26855::"Accessible"} Metoprolol XL 50mg  {Appropriate:26852::"Appropriate"}, {Effective:26853::"Effective"}, {Safe:26854::"Safe"}, {accessible:26855::"Accessible"} Isosorbide 60mg  24hr {Appropriate:26852::"Appropriate"},  {Effective:26853::"Effective"}, {Safe:26854::"Safe"}, {accessible:26855::"Accessible"} -Medications previously tried: ***  -Current home readings: *** -Current dietary habits: *** -Current exercise habits: *** -{ACTIONS;DENIES/REPORTS:21021675::"Denies"} hypotensive/hypertensive symptoms -Educated on {CCM BP Counseling:25124} -Counseled to monitor BP at home ***, document, and provide log at future appointments -{CCMPHARMDINTERVENTION:25122}  Hyperlipidemia: (LDL goal < ***) -{US controlled/uncontrolled:25276} -Current treatment: Rosuvastatin 20mg  {Appropriate:26852::"Appropriate"}, {Effective:26853::"Effective"}, {Safe:26854::"Safe"}, {accessible:26855::"Accessible"} Repatha 140mg /ml {Appropriate:26852::"Appropriate"}, {Effective:26853::"Effective"}, {Safe:26854::"Safe"}, {accessible:26855::"Accessible"} -Medications previously tried: ***  -Current dietary patterns: *** -Current exercise habits: *** -Educated on {CCM HLD Counseling:25126} -{CCMPHARMDINTERVENTION:25122}  Heart Failure (Goal: manage symptoms and prevent exacerbations) -{US controlled/uncontrolled:25276} -Last ejection fraction: *** (Date: ***) -HF type: {type of heart failure:28376} -NYHA Class: {CHL HP Upstream Pharm NYHA Class:[phone]} -AHA HF Stage: {CHL HP Upstream Pharm AHA HF [phone]} -Current treatment: Furosemide 20mg  {Appropriate:26852::"Appropriate"}, {Effective:26853::"Effective"}, {Safe:26854::"Safe"}, {accessible:26855::"Accessible"} Metoprolol XL 50mg  {Appropriate:26852::"Appropriate"}, {Effective:26853::"Effective"}, {Safe:26854::"Safe"}, {accessible:26855::"Accessible"} -Medications previously tried: ***  -Current home BP/HR readings: *** -Current home daily weights: *** -Current dietary habits: *** -Current exercise habits: *** -Educated on {CCM HF Counseling:25125} -{CCMPHARMDINTERVENTION:25122}  COPD (Goal: control symptoms and prevent exacerbations) -{US  controlled/uncontrolled:25276} -Current treatment  Breztri 160-9-4.8 mcg {Appropriate:26852::"Appropriate"}, {Effective:26853::"Effective"}, {Safe:26854::"Safe"}, {accessible:26855::"Accessible"} Albuterol HFA prn {Appropriate:26852::"Appropriate"}, {Effective:26853::"Effective"}, {Safe:26854::"Safe"}, {accessible:26855::"Accessible"} -Medications previously tried: ***  -Gold Grade: {CHL HP Upstream Pharm COPD Gold VZDGL:[id number]} -Current COPD Classification:  {CHL Upstream Pharm COPD Class (Updated):28375} -MMRC/CAT score: *** -Pulmonary function testing: *** -Exacerbations requiring treatment in last 6 months: *** -Patient {Actions; denies-reports:120008} consistent use of maintenance inhaler -Frequency of rescue inhaler use: *** -Counseled on {CCMINHALERCOUNSELING:25121} -{CCMPHARMDINTERVENTION:25122}  Medication Evaluation (Goal: ***) -{US controlled/uncontrolled:25276} -Current treatment  Allopurinol 100mg  {Appropriate:26852::"Appropriate"}, {Effective:26853::"Effective"}, {Safe:26854::"Safe"}, {accessible:26855::"Accessible"} Eliquis 5mg  {Appropriate:26852::"Appropriate"}, {Effective:26853::"Effective"}, {Safe:26854::"Safe"}, {accessible:26855::"Accessible"} ASA 81mg  {Appropriate:26852::"Appropriate"}, {Effective:26853::"Effective"}, {Safe:26854::"Safe"}, {accessible:26855::"Accessible"} Colchicine 0.6mg  {Appropriate:26852::"Appropriate"}, {Effective:26853::"Effective"}, {Safe:26854::"Safe"}, {accessible:26855::"Accessible"} Fluoxetine 20mg  {Appropriate:26852::"Appropriate"}, {Effective:26853::"Effective"}, {Safe:26854::"Safe"}, {accessible:26855::"Accessible"} Metformin 500mg  bid {Appropriate:26852::"Appropriate"}, {Effective:26853::"Effective"}, {Safe:26854::"Safe"}, {accessible:26855::"Accessible"} Wegovy 0.25mg  once weekly {Appropriate:26852::"Appropriate"}, {Effective:26853::"Effective"}, {Safe:26854::"Safe"}, {  accessible:26855::"Accessible"} -Medications previously  tried: ***  -{CCMPHARMDINTERVENTION:25122}  ***

## 2023-03-25 DIAGNOSIS — J4489 Other specified chronic obstructive pulmonary disease: Secondary | ICD-10-CM | POA: Diagnosis not present

## 2023-03-25 DIAGNOSIS — M109 Gout, unspecified: Secondary | ICD-10-CM | POA: Diagnosis not present

## 2023-03-25 DIAGNOSIS — J4540 Moderate persistent asthma, uncomplicated: Secondary | ICD-10-CM | POA: Diagnosis not present

## 2023-03-25 DIAGNOSIS — H1013 Acute atopic conjunctivitis, bilateral: Secondary | ICD-10-CM | POA: Diagnosis not present

## 2023-03-25 DIAGNOSIS — M170 Bilateral primary osteoarthritis of knee: Secondary | ICD-10-CM | POA: Diagnosis not present

## 2023-03-25 DIAGNOSIS — I2583 Coronary atherosclerosis due to lipid rich plaque: Secondary | ICD-10-CM | POA: Diagnosis not present

## 2023-03-25 DIAGNOSIS — M542 Cervicalgia: Secondary | ICD-10-CM | POA: Diagnosis not present

## 2023-03-25 DIAGNOSIS — R7303 Prediabetes: Secondary | ICD-10-CM | POA: Diagnosis not present

## 2023-03-25 DIAGNOSIS — I10 Essential (primary) hypertension: Secondary | ICD-10-CM | POA: Diagnosis not present

## 2023-03-25 DIAGNOSIS — M19012 Primary osteoarthritis, left shoulder: Secondary | ICD-10-CM | POA: Diagnosis not present

## 2023-03-25 DIAGNOSIS — E785 Hyperlipidemia, unspecified: Secondary | ICD-10-CM | POA: Diagnosis not present

## 2023-03-25 DIAGNOSIS — B351 Tinea unguium: Secondary | ICD-10-CM | POA: Diagnosis not present

## 2023-03-25 DIAGNOSIS — G4733 Obstructive sleep apnea (adult) (pediatric): Secondary | ICD-10-CM | POA: Diagnosis not present

## 2023-03-25 DIAGNOSIS — M545 Low back pain, unspecified: Secondary | ICD-10-CM | POA: Diagnosis not present

## 2023-03-25 DIAGNOSIS — G8929 Other chronic pain: Secondary | ICD-10-CM | POA: Diagnosis not present

## 2023-03-25 DIAGNOSIS — K449 Diaphragmatic hernia without obstruction or gangrene: Secondary | ICD-10-CM | POA: Diagnosis not present

## 2023-03-26 ENCOUNTER — Other Ambulatory Visit (HOSPITAL_COMMUNITY): Payer: Self-pay

## 2023-03-26 ENCOUNTER — Ambulatory Visit: Payer: Medicare Other | Admitting: Pharmacist

## 2023-03-26 NOTE — Progress Notes (Incomplete)
Care Management & Coordination Services Pharmacy Note  03/27/2023 Name:  Allen [name] MRN:  [id number] DOB:  1946-01-18  Summary: PharmD initial visit.  Patient doing well overall.  Needs to improve LDL to meet goal for CAD.  He is tolerating lower dose of Crestor well which has improved adherence.  Counseled on inhaler use and making sure to use maintenance daily.  He has started on Wegovy for weight management and CV benefit.  Recommendations/Changes made from today's visit: No changes to meds at this time, will need new lipids - has FU with cards later this year Will attempt to assist with copay on Repatha   Follow up plan: FU 6 months CMA to check in on adherence in 3 months   Subjective: Allen [name] is an 77 y.o. year old male who is a primary patient of Lula Olszewski, MD.  The care coordination team was consulted for assistance with disease management and care coordination needs.    Engaged with patient by telephone for initial visit.  Recent office visits:  01/20/2023 OV (PCP) Lula Olszewski, MD; If I can get agreement on these suspected diagnosis from cardiologist I can probably add spironolactone to help with the orthopnea and Jardiance as well. Please check with the pharmacy about why we do not have Ozempic and let me know what I need to do. We have a prior Auth team that can help with prior authorizations if that is needed.    12/30/2022 OV (PCP) Lula Olszewski, MD; no medication changes indicated.   Recent consult visits:  None   Hospital visits:  None in previous 6 months   Objective:  Lab Results  Component Value Date   CREATININE 0.96 12/31/2022   BUN 21 12/31/2022   GFR 77.98 12/31/2022   EGFR 74 05/06/2022   GFRNONAA >60 05/25/2021   GFRAA >60 12/28/2019   NA 138 12/31/2022   K 4.6 12/31/2022   CALCIUM 9.8 12/31/2022   CO2 27 12/31/2022   GLUCOSE 104 (H) 12/31/2022    Lab Results  Component Value Date/Time   HGBA1C 6.0 12/31/2022 10:42  AM   HGBA1C 5.8 (H) 05/04/2021 02:47 PM   GFR 77.98 12/31/2022 10:42 AM   GFR 74.92 07/26/2020 09:55 AM    Last diabetic Eye exam: No results found for: "HMDIABEYEEXA"  Last diabetic Foot exam: No results found for: "HMDIABFOOTEX"   Lab Results  Component Value Date   CHOL 147 12/31/2022   HDL 38.30 (L) 12/31/2022   LDLCALC 112 (H) 04/09/2022   LDLDIRECT 89.0 12/31/2022   TRIG 206.0 (H) 12/31/2022   CHOLHDL 4 12/31/2022       Latest Ref Rng & Units 12/31/2022   10:42 AM 05/23/2021    7:43 PM 05/04/2021    2:47 PM  Hepatic Function  Total Protein 6.0 - 8.3 g/dL 7.5  7.8  7.7   Albumin 3.5 - 5.2 g/dL 4.2  4.1    AST 0 - 37 U/L 16  17  14    ALT 0 - 53 U/L 18  17  15    Alk Phosphatase 39 - 117 U/L 83  70    Total Bilirubin 0.2 - 1.2 mg/dL 0.4  0.7  0.5     Lab Results  Component Value Date/Time   TSH 2.10 12/31/2022 10:42 AM   TSH 2.24 07/26/2020 09:55 AM   FREET4 0.73 04/29/2014 11:13 AM       Latest Ref Rng & Units 12/31/2022   10:42 AM  05/25/2021    1:01 AM 05/23/2021    7:43 PM  CBC  WBC 4.0 - 10.5 K/uL 7.3  12.3  13.6   Hemoglobin 13.0 - 17.0 g/dL 91.4  78.2  95.6   Hematocrit 39.0 - 52.0 % 43.5  39.7  43.2   Platelets 150.0 - 400.0 K/uL 260.0  231  263     Lab Results  Component Value Date/Time   VITAMINB12 191 (L) 12/31/2022 10:42 AM    Clinical ASCVD: Yes  The ASCVD Risk score (Arnett DK, et al., 2019) failed to calculate for the following reasons:   The patient has a prior MI or stroke diagnosis        01/28/2023    8:07 AM 12/30/2022    2:16 PM 06/20/2021    3:41 PM  Depression screen PHQ 2/9  Decreased Interest 0 0 0  Down, Depressed, Hopeless 0 0 0  PHQ - 2 Score 0 0 0  Altered sleeping 0 1   Tired, decreased energy 0 1   Change in appetite 0 3   Feeling bad or failure about yourself  0 0   Trouble concentrating 0 0   Moving slowly or fidgety/restless 0 3   Suicidal thoughts 0 0   PHQ-9 Score 0 8   Difficult doing work/chores Not difficult at  all Somewhat difficult      Social History   Tobacco Use  Smoking Status Former   Packs/day: 2.00   Years: 35.00   Additional pack years: 0.00   Total pack years: 70.00   Types: Cigarettes   Quit date: 10/21/1997   Years since quitting: 25.4  Smokeless Tobacco Never  Tobacco Comments   2-3 ppd   BP Readings from Last 3 Encounters:  03/06/23 132/72  03/03/23 (!) 140/70  01/20/23 (!) 140/70   Pulse Readings from Last 3 Encounters:  03/06/23 (!) 58  03/03/23 73  01/20/23 63   Wt Readings from Last 3 Encounters:  03/06/23 268 lb 9.6 oz (121.8 kg)  03/03/23 275 lb 12.8 oz (125.1 kg)  01/28/23 281 lb (127.5 kg)   BMI Readings from Last 3 Encounters:  03/06/23 37.46 kg/m  03/03/23 38.47 kg/m  01/28/23 38.11 kg/m    Allergies  Allergen Reactions   Penicillins Hives and Other (See Comments)    Has patient had a PCN reaction causing immediate rash, facial/tongue/throat swelling, SOB or lightheadedness with hypotension: no Has patient had a PCN reaction causing severe rash involving mucus membranes or skin necrosis: no Has patient had a PCN reaction that required hospitalization no - childhood reaction Has patient had a PCN reaction occurring within the last 10 years: no - childhood reaction If all of the above answers are "NO", then may proceed with Cephalosporin use.    Sulfonamide Derivatives Hives   Sulfa Antibiotics Hives   Advil [Ibuprofen] Swelling and Other (See Comments)    Lip swelling   Atorvastatin Other (See Comments)    Muscle pain    Medications Reviewed Today     Reviewed by Erroll Luna, [facility] (Pharmacist) on 03/27/23 at 1047  Med List Status: <None>   Medication Order Taking? Sig Documenting Provider Last Dose Status Informant  albuterol (VENTOLIN HFA) 108 (90 Base) MCG/ACT inhaler [id number] Yes INHALE 2 PUFFS BY MOUTH EVERY 6 HOURS AS NEEDED FOR WHEEZING OR SHORTNESS OF BREATH Sheliah Hatch, MD Taking Active   allopurinol (ZYLOPRIM) 100  MG tablet [id number] Yes Take 1 tablet (100 mg total) by mouth daily.  Janeece Agee, NP Taking Active Self  apixaban (ELIQUIS) 5 MG TABS tablet [id number] Yes Take 1 tablet (5 mg total) by mouth 2 (two) times daily. Tomma Lightning, MD Taking Active   aspirin EC 81 MG EC tablet [id number] Yes Take 1 tablet (81 mg total) by mouth daily. Arty Baumgartner, NP Taking Active Self           Med Note Margo Aye, MISTY D   Thu May 24, 2021  3:47 AM) Med on hold while pt is taking colchicine  Budeson-Glycopyrrol-Formoterol (BREZTRI AEROSPHERE) 160-9-4.8 MCG/ACT AERO [id number] Yes Inhale 2 puffs into the lungs in the morning and at bedtime. Parrett, Virgel Bouquet, NP Taking Active   ciclopirox (LOPROX) 0.77 % cream [id number]  Apply topically 2 (two) times daily. Lula Olszewski, MD  Active   colchicine 0.6 MG tablet [id number] No Take 1 tablet (0.6 mg total) by mouth daily. Until flare resolves. Do not take rosuvastatin or aspirin when taking this medication.  Patient not taking: Reported on 03/26/2023   Janeece Agee, NP Not Taking Active Self  cyanocobalamin (VITAMIN B12) 1000 MCG tablet [id number] Yes Take 1 tablet (1,000 mcg total) by mouth daily. Lula Olszewski, MD Taking Active   Evolocumab [facility] SURECLICK) 140 MG/ML Ivory Broad [id number] Yes INJECT 1 ML INTO THE SKIN EVERY 14 (FOURTEEN) DAYS. Runell Gess, MD Taking Active   FLUoxetine (PROZAC) 20 MG tablet [id number] Yes Take 1 tablet (20 mg total) by mouth daily. To start: just half tablet daily for 2 weeks Lula Olszewski, MD Taking Active   furosemide (LASIX) 20 MG tablet [id number] Yes TAKE 2 TABLETS FOR 3 DAYS THEN DECREASE TO 1 TABLET Marykay Lex, MD Taking Active   isosorbide mononitrate (IMDUR) 60 MG 24 hr tablet [id number] Yes Take 1 tablet (60 mg total) by mouth daily. Carlos Levering, NP Taking Active   metFORMIN (GLUCOPHAGE) 500 MG tablet [id number] Yes Take 1 tablet (500 mg total) by mouth 2 (two) times daily with a meal. Lula Olszewski,  MD Taking Active   metoprolol succinate (TOPROL-XL) 50 MG 24 hr tablet [id number] Yes TAKE 1 TABLET BY MOUTH DAILY. TAKE WITH OR IMMEDIATELY FOLLOWING A MEAL. Runell Gess, MD Taking Active   nitroGLYCERIN (NITROSTAT) 0.4 MG SL tablet [id number] Yes PLACE 1 TABLET UNDER THE TONGUE EVERY 5 MINUTES AS NEEDED FOR CHEST PAIN. Carlos Levering, NP Taking Active   olmesartan-hydrochlorothiazide (BENICAR HCT) 20-12.5 MG tablet [id number] Yes Take 1 tablet by mouth daily. Sheliah Hatch, MD Taking Active   Olopatadine HCl (PATADAY) 0.2 % SOLN [id number]  Apply 1 drop to each eye once daily. Lula Olszewski, MD  Active   rosuvastatin (CRESTOR) 20 MG tablet [id number] Yes Take 1 tablet (20 mg total) by mouth daily. Carlos Levering, NP Taking Active   Semaglutide-Weight Management (WEGOVY) 0.25 MG/0.5ML Ivory Broad [id number] Yes Inject 0.25 mg into the skin once a week. Carlos Levering, NP Taking Active             SDOH:  (Social Determinants of Health) assessments and interventions performed: Yes Financial Resource Strain: Low Risk  (03/27/2023)   Overall Financial Resource Strain (CARDIA)    Difficulty of Paying Living Expenses: Not very hard   Food Insecurity: No Food Insecurity (03/27/2023)   Hunger Vital Sign    Worried About Running Out of Food in the Last Year: Never true    Ran Out of Food in the Last Year: Never true    SDOH Interventions  Flowsheet Row Clinical Support from 01/28/2023 in [facility] Most recent reading at 01/28/2023  8:08 AM Clinical Support from 10/16/2020 in [facility] Most recent reading at 10/16/2020 11:36 AM Office Visit from 06/17/2018 in [facility] Most recent reading at 06/17/2018  8:53 AM Clinical Support from 06/17/2018 in [facility] Most recent reading at 06/17/2018  8:28 AM  SDOH Interventions      Food  Insecurity Interventions Intervention Not Indicated -- -- --  Housing Interventions Intervention Not Indicated -- -- --  Transportation Interventions Intervention Not Indicated -- -- --  Utilities Interventions Intervention Not Indicated -- -- --  Depression Interventions/Treatment  -- -- Currently on Treatment Currently on Treatment  Financial Strain Interventions Intervention Not Indicated -- -- --  Physical Activity Interventions Intervention Not Indicated Intervention Not Indicated  [pt has set a goal to increase activity] -- --  Stress Interventions Intervention Not Indicated -- -- --  Social Connections Interventions Intervention Not Indicated -- -- --       Medication Assistance:  Attempting to assist with [employer] application for Repatha.  Medication Access: Name and location of current pharmacy:  [id number] [place], [place] - [address] [place] [address] Phone: [phone] Fax: [phone]  [facility] [address] [place] [address] Phone: [phone] Fax: [phone]  Within the past 30 days, how often has patient missed a dose of medication? 0 Is a pillbox or other method used to improve adherence? No  Factors that may affect medication adherence? no barriers identified Are meds synced by current pharmacy? No  Are meds delivered by current pharmacy? No  Does patient experience delays in picking up medications due to transportation concerns? No   Compliance/Adherence/Medication fill history: Star Rating Drugs:  Metformin 500 mg last filled 12/30/2022 90 DS Olmesartan-HCTZ 20-12.5 mg last filled 03/26/2022 90 DS Rosuvastatin 40 mg last filled 04/19/2022 90 DS     Care Gaps: Annual wellness visit in last year? Yes   Assessment/Plan   Hyperlipidemia/CAD: (LDL goal < 70) -Uncontrolled -Current treatment: Rosuvastatin 20mg  Appropriate, Query  effective, ,  Repatha 140mg /ml every 14 days Appropriate, Effective, Safe, Accessible -Medications previously tried: Rosuvastatin 40mg    -Educated on Cholesterol goals;  Benefits of statin for ASCVD risk reduction; Importance of limiting foods high in cholesterol; - Recently decreased statin dose in hopes of improving adherence.  He is taking this daily and reports no adverse effects.  Encouraged him to continue to take this daily to reach LDL goal.  Will check in to Repatha assistance through [facility].  He reports high copays.  Has not had to stop taking yet but want him to avoid higher copay with donut hole.  Continue current meds, continue routine lipid screens. CMA to assess adherence in 90 days.  Heart Failure (Goal: manage symptoms and prevent exacerbations) -Controlled -Last ejection fraction: 55-60% -Current treatment: Metoprolol XL 50mg  Appropriate, Effective, Safe, Accessible Isosorbide ER 60mg  Appropriate, Effective, Safe, Accessible Furosemide 20mg  Appropriate, Effective, Safe, Accessible -Medications previously tried: none noted  -Current home BP/HR readings: controlled -Current home daily weights: monitors daily, also monitors fluid -Educated on Benefits of medications for managing symptoms and prolonging life Importance of weighing daily; if you gain more than 3 pounds in one day or 5 pounds in one week, contact providers Proper diuretic  administration and potassium supplementation -Counseled on diet and exercise extensively Recommended to continue current medication Knows warning signs to watch out for exacerbation.  Denies any recent SOB or weight gain.  COPD (Goal: control symptoms and prevent exacerbations) -Controlled -Current treatment  Breztri 2 puffs bid Appropriate, Effective, Safe, Accessible Albuterol HFA 90 mcg prn Appropriate, Effective, Safe, Accessible -Medications previously tried: Symbicort   -Pulmonary function testing: Pulmonary Functions Testing  Results:  TLC  Date Value Ref Range Status  09/28/2020 6.80 L Final    -Exacerbations requiring treatment in last 6 months: none -Patient reports consistent use of maintenance inhaler -Frequency of rescue inhaler use: maybe two to three times per week -Counseled on Proper inhaler technique; Benefits of consistent maintenance inhaler use When to use rescue inhaler Differences between maintenance and rescue inhalers -Recommended to continue current medication Copay affordable with Breztri.  Remember to use this daily!   Other Med Eval  -Current treatment  Allopurinol 100mg  Appropriate, Effective, Safe, Accessible Eliquis 5mg  bid Appropriate, Effective, Safe, Accessible Fluoxetine 20mg  Appropriate, Effective, Safe, Accessible Metformin 500mg  bid Appropriate, Effective, Safe, Accessible Olmesartan-HCTZ 20-12.5 Appropriate, Effective, Safe, Accessible Wegovy 0.25mg  once weekly Appropriate, Effective, Safe, Accessible Colchicine 0.6mg  prn Appropriate, Effective, Safe, Accessible    Willa Frater, PharmD Clinical Pharmacist  [place] [phone]

## 2023-03-28 DIAGNOSIS — G4733 Obstructive sleep apnea (adult) (pediatric): Secondary | ICD-10-CM | POA: Diagnosis not present

## 2023-03-28 DIAGNOSIS — I2583 Coronary atherosclerosis due to lipid rich plaque: Secondary | ICD-10-CM | POA: Diagnosis not present

## 2023-03-28 DIAGNOSIS — K449 Diaphragmatic hernia without obstruction or gangrene: Secondary | ICD-10-CM | POA: Diagnosis not present

## 2023-03-28 DIAGNOSIS — J4540 Moderate persistent asthma, uncomplicated: Secondary | ICD-10-CM | POA: Diagnosis not present

## 2023-03-28 DIAGNOSIS — E785 Hyperlipidemia, unspecified: Secondary | ICD-10-CM | POA: Diagnosis not present

## 2023-03-28 DIAGNOSIS — M542 Cervicalgia: Secondary | ICD-10-CM | POA: Diagnosis not present

## 2023-03-28 DIAGNOSIS — M109 Gout, unspecified: Secondary | ICD-10-CM | POA: Diagnosis not present

## 2023-03-28 DIAGNOSIS — H1013 Acute atopic conjunctivitis, bilateral: Secondary | ICD-10-CM | POA: Diagnosis not present

## 2023-03-28 DIAGNOSIS — M545 Low back pain, unspecified: Secondary | ICD-10-CM | POA: Diagnosis not present

## 2023-03-28 DIAGNOSIS — G8929 Other chronic pain: Secondary | ICD-10-CM | POA: Diagnosis not present

## 2023-03-28 DIAGNOSIS — I10 Essential (primary) hypertension: Secondary | ICD-10-CM | POA: Diagnosis not present

## 2023-03-28 DIAGNOSIS — M170 Bilateral primary osteoarthritis of knee: Secondary | ICD-10-CM | POA: Diagnosis not present

## 2023-03-28 DIAGNOSIS — R7303 Prediabetes: Secondary | ICD-10-CM | POA: Diagnosis not present

## 2023-03-28 DIAGNOSIS — M19012 Primary osteoarthritis, left shoulder: Secondary | ICD-10-CM | POA: Diagnosis not present

## 2023-03-28 DIAGNOSIS — B351 Tinea unguium: Secondary | ICD-10-CM | POA: Diagnosis not present

## 2023-03-28 DIAGNOSIS — J4489 Other specified chronic obstructive pulmonary disease: Secondary | ICD-10-CM | POA: Diagnosis not present

## 2023-04-09 ENCOUNTER — Other Ambulatory Visit: Payer: Self-pay | Admitting: Student

## 2023-04-09 ENCOUNTER — Other Ambulatory Visit (HOSPITAL_COMMUNITY): Payer: Self-pay

## 2023-04-09 DIAGNOSIS — E785 Hyperlipidemia, unspecified: Secondary | ICD-10-CM

## 2023-04-09 DIAGNOSIS — I6523 Occlusion and stenosis of bilateral carotid arteries: Secondary | ICD-10-CM

## 2023-04-09 DIAGNOSIS — I25118 Atherosclerotic heart disease of native coronary artery with other forms of angina pectoris: Secondary | ICD-10-CM

## 2023-04-09 MED ORDER — WEGOVY 0.5 MG/0.5ML ~~LOC~~ SOAJ
0.5000 mg | SUBCUTANEOUS | 0 refills | Status: DC
  Filled 2023-04-09 – 2023-04-16 (×2): qty 2, 28d supply, fill #0

## 2023-04-09 NOTE — Telephone Encounter (Signed)
Has completed starting dose of Wegovy. Will send in next dose, 0.5mg  weekly.

## 2023-04-10 ENCOUNTER — Other Ambulatory Visit (HOSPITAL_COMMUNITY): Payer: Self-pay

## 2023-04-11 ENCOUNTER — Telehealth: Payer: Self-pay | Admitting: Cardiovascular Disease

## 2023-04-11 ENCOUNTER — Other Ambulatory Visit (HOSPITAL_COMMUNITY): Payer: Self-pay

## 2023-04-11 NOTE — Telephone Encounter (Signed)
Pt daughter Amy (DPR), pt did receive the 1st month supply of Wegovy. However, when the pt tried to refill the medication he advised it was filled by mistake. His health insurance company will not pay for the medication due to pt does not have diabetes. Pt was advised BC[employer] will pay for ozempic. Pt was advised he will need another prior authorization completed by our office. Will forward to prior auth pool.

## 2023-04-11 NOTE — Telephone Encounter (Signed)
Pt c/o medication issue:  1. Name of Medication: Semaglutide-Weight Management (WEGOVY) 0.5 MG/0.5ML SOAJ   2. How are you currently taking this medication (dosage and times per day)? As prescribed   3. Are you having a reaction (difficulty breathing--STAT)?   4. What is your medication issue? Pt's daughter is trying to get another PA for this medication due to restriction changes with insurance.

## 2023-04-11 NOTE — Telephone Encounter (Signed)
Per test claim even with PA drug is only approved if FDA approved diagnosis is provided. Reginal Lutes is FDA approved for Obesity. Ozempic is not. While yes, this plan does need a PA for Ozempic, approval is restricted to patients with a diagnosis of diabetes. Patient would need to show an A1c of 6.5 or greater to be considered for approval.

## 2023-04-14 NOTE — Telephone Encounter (Signed)
He has CAD and obesity, can a prior auth renewal be submitted for [facility] under CAD indication?

## 2023-04-14 NOTE — Telephone Encounter (Signed)
Forms were faxed on 5/21.

## 2023-04-15 ENCOUNTER — Telehealth: Payer: Self-pay | Admitting: Cardiovascular Disease

## 2023-04-15 ENCOUNTER — Other Ambulatory Visit (HOSPITAL_COMMUNITY): Payer: Self-pay

## 2023-04-15 ENCOUNTER — Telehealth: Payer: Self-pay

## 2023-04-15 NOTE — Telephone Encounter (Signed)
Pharmacy Patient Advocate Encounter   Received notification from [facility] MEDICARE that prior authorization for [facility] is needed.    PA submitted on 04/15/23 Key [id number] Status is pending  Haze Rushing, CPhT Pharmacy Patient Advocate Specialist Direct Number: [phone] Fax: [phone]

## 2023-04-15 NOTE — Telephone Encounter (Signed)
We can try

## 2023-04-15 NOTE — Telephone Encounter (Signed)
Caller provided stated patient's Semaglutide-Weight Management (WEGOVY) 0.5 MG/0.5ML SOAJ is covered for 1 year starting on today's date (6/25).  Case# [id number].  Caller stated patient has also been notified.

## 2023-04-16 ENCOUNTER — Other Ambulatory Visit (HOSPITAL_COMMUNITY): Payer: Self-pay

## 2023-04-16 MED ORDER — WEGOVY 0.5 MG/0.5ML ~~LOC~~ SOAJ
0.5000 mg | SUBCUTANEOUS | 0 refills | Status: DC
  Filled 2023-04-16 – 2023-04-23 (×4): qty 2, 28d supply, fill #0

## 2023-04-16 NOTE — Telephone Encounter (Signed)
There are 3 open encounters about this, documented in other encounter.

## 2023-04-16 NOTE — Addendum Note (Signed)
Addended by: Chlora Mcbain E on: 04/16/2023 12:59 PM   Modules accepted: Orders

## 2023-04-16 NOTE — Telephone Encounter (Addendum)
Pt's daughter Amy called clinic stating that [facility] told her that [facility] still needed a prior authorization. It does not, this has been approved both last month and this month. Called [facility] who states med is rejecting that it still needs a PA. They stated they would call pt's insurance to see what's going on. Confirmed they're running rx as brand Wegovy with today's date of service. Sounds like there can be up to a 72 hr delay with BCBS plans once PA is approved before pharmacy can process rx for some reason. Pt's daughter is aware, states [place] and will let us know if they're still having issues getting rx once they're back.

## 2023-04-16 NOTE — Telephone Encounter (Signed)
Pharmacy Patient Advocate Encounter  Prior Authorization for [facility] has been approved.    Effective dates: 04/15/23 through 04/14/24  Haze Rushing, CPhT Pharmacy Patient Advocate Specialist Direct Number: [phone] Fax: [phone]

## 2023-04-16 NOTE — Telephone Encounter (Signed)
Noted in other encounter.

## 2023-04-16 NOTE — Telephone Encounter (Addendum)
Wegovy PA approved through 04/14/24 per other note. Not sure why insurance required another authorization since there was an approval documented a month ago with the same end date. Called pt and left him a message advising him that pharmacy should be able to fill his med again.

## 2023-04-18 ENCOUNTER — Other Ambulatory Visit: Payer: Self-pay

## 2023-04-18 ENCOUNTER — Telehealth: Payer: Self-pay | Admitting: Internal Medicine

## 2023-04-18 NOTE — Telephone Encounter (Signed)
Spoke with patient concerning this. 

## 2023-04-18 NOTE — Telephone Encounter (Signed)
Pt states he was discharged from inhouse therapy and now he needs orders for outpatient tharapy. Please advise.

## 2023-04-20 ENCOUNTER — Other Ambulatory Visit: Payer: Self-pay | Admitting: Pulmonary Disease

## 2023-04-22 ENCOUNTER — Other Ambulatory Visit (HOSPITAL_COMMUNITY): Payer: Self-pay

## 2023-04-22 ENCOUNTER — Telehealth: Payer: Self-pay | Admitting: Cardiovascular Disease

## 2023-04-22 NOTE — Telephone Encounter (Signed)
Pt c/o medication issue:  1. Name of Medication:   WEGOVY 0.5 MG/0.5ML SOAJ   2. How are you currently taking this medication (dosage and times per day)?   As prescribed  3. Are you having a reaction (difficulty breathing--STAT)?   No  4. What is your medication issue?   Patient stated [facility] is sending prior authorization for this medication.  Patient stated he has had 4 doses of this medication and he is now completely out.  Patient wants new prescription sent to [facility].

## 2023-04-23 ENCOUNTER — Other Ambulatory Visit (HOSPITAL_COMMUNITY): Payer: Self-pay

## 2023-04-23 ENCOUNTER — Other Ambulatory Visit: Payer: Self-pay

## 2023-04-23 DIAGNOSIS — G8929 Other chronic pain: Secondary | ICD-10-CM

## 2023-04-23 DIAGNOSIS — M170 Bilateral primary osteoarthritis of knee: Secondary | ICD-10-CM

## 2023-04-23 DIAGNOSIS — S39012D Strain of muscle, fascia and tendon of lower back, subsequent encounter: Secondary | ICD-10-CM

## 2023-04-23 DIAGNOSIS — I999 Unspecified disorder of circulatory system: Secondary | ICD-10-CM

## 2023-04-23 DIAGNOSIS — Z7409 Other reduced mobility: Secondary | ICD-10-CM

## 2023-04-23 DIAGNOSIS — R200 Anesthesia of skin: Secondary | ICD-10-CM

## 2023-04-23 NOTE — Telephone Encounter (Signed)
There is a PA approval on file, I do not know why pharmacy cannot run the claim. They will likely need to call pt's insurance.

## 2023-04-23 NOTE — Telephone Encounter (Signed)
Pt's daughter Linton Rump is requesting a callback at [phone] from [facility] regarding this medication still being rejected at the pharmacy stating it needs a PA. She said she was told to callback if still having issues once they return from the beach. Please advise.

## 2023-04-23 NOTE — Telephone Encounter (Signed)
Returned call to pt's daughter Amy, she states right after she called [place], the pharmacy called her saying they figured out rx and that med is ready to be filled. She was appreciative for the call back.

## 2023-04-23 NOTE — Telephone Encounter (Signed)
Shows pharmacy has filled it.

## 2023-05-05 ENCOUNTER — Other Ambulatory Visit (HOSPITAL_COMMUNITY): Payer: Self-pay

## 2023-05-12 ENCOUNTER — Other Ambulatory Visit (HOSPITAL_COMMUNITY): Payer: Self-pay

## 2023-05-12 ENCOUNTER — Telehealth: Payer: Self-pay | Admitting: Pharmacy Technician

## 2023-05-12 NOTE — Telephone Encounter (Signed)
Pharmacy Patient Advocate Encounter   Received notification from CoverMyMeds that prior authorization for Repatha SureClick 140MG /ML auto-injectors is required/requested.   Insurance verification completed.   The patient is insured through [facility] .   Per test claim: PA started via CoverMyMeds. KEY [id number] . Waiting for clinical questions to populate.

## 2023-05-12 NOTE — Telephone Encounter (Signed)
Patient is returning call.  °

## 2023-05-12 NOTE — Telephone Encounter (Signed)
Pharmacy Patient Advocate Encounter   Received notification from CoverMyMeds that prior authorization for Repatha SureClick 140MG /ML auto-injectors  is required/requested.   Insurance verification completed.   The patient is insured through [facility] .   Per test claim: PA submitted to [employer] via CoverMyMeds Key/confirmation #/EOC [id number] Status is pending

## 2023-05-12 NOTE — Telephone Encounter (Signed)
[employer] insurance is calling to state that this medication has been approved.   Approval dates:  05/12/2023-05/11/2024

## 2023-05-12 NOTE — Telephone Encounter (Signed)
Prior Auth approved.  Left voicemail for patient to return call to office

## 2023-05-12 NOTE — Telephone Encounter (Signed)
Patient aware PA for repatha has been approved

## 2023-05-16 ENCOUNTER — Telehealth: Payer: Self-pay | Admitting: Cardiovascular Disease

## 2023-05-16 NOTE — Telephone Encounter (Signed)
Notified [employer] that patient is on Rosuvastatin 20 mg. She states no claims for this medication and that shows patient not currently taking this medication.   Call to patient.  LVM to call office to clarify if taking medications

## 2023-05-16 NOTE — Telephone Encounter (Signed)
Pt c/o medication issue:  1. Name of Medication:   Statin medication  2. How are you currently taking this medication (dosage and times per day)?   3. Are you having a reaction (difficulty breathing--STAT)?   4. What is your medication issue?   Caller wants to confirm patient is on a statin medication.  Caller stated she will be faxing information regarding this.  Caller stated it is OK to leave her a VM.

## 2023-05-19 ENCOUNTER — Other Ambulatory Visit: Payer: Self-pay | Admitting: Cardiovascular Disease

## 2023-05-19 ENCOUNTER — Other Ambulatory Visit (HOSPITAL_COMMUNITY): Payer: Self-pay

## 2023-05-19 MED ORDER — SEMAGLUTIDE-WEIGHT MANAGEMENT 1.7 MG/0.75ML ~~LOC~~ SOAJ
1.7000 mg | SUBCUTANEOUS | 0 refills | Status: AC
  Filled 2023-05-19 – 2023-07-08 (×2): qty 3, 28d supply, fill #0

## 2023-05-19 MED ORDER — SEMAGLUTIDE-WEIGHT MANAGEMENT 1 MG/0.5ML ~~LOC~~ SOAJ
1.0000 mg | SUBCUTANEOUS | 0 refills | Status: AC
  Filled 2023-05-19 – 2023-06-05 (×3): qty 2, 28d supply, fill #0

## 2023-05-19 MED ORDER — SEMAGLUTIDE-WEIGHT MANAGEMENT 2.4 MG/0.75ML ~~LOC~~ SOAJ
2.4000 mg | SUBCUTANEOUS | 11 refills | Status: DC
  Filled 2023-05-19: qty 3, fill #0
  Filled 2023-08-18: qty 3, 28d supply, fill #0
  Filled 2023-09-22: qty 3, 28d supply, fill #1
  Filled 2023-10-25 – 2023-11-19 (×3): qty 3, 28d supply, fill #2

## 2023-05-19 NOTE — Telephone Encounter (Signed)
Call to patient at both offices and LVM to please call office.  Call to clarify if taking medication

## 2023-05-19 NOTE — Telephone Encounter (Signed)
Called pt, tolerating Wegovy well, occasional diarrhea. Will send in rest of dose titration schedule. Has lost almost 40 lbs so far, down to 238, highest 291.

## 2023-05-19 NOTE — Telephone Encounter (Signed)
Unable to talk to patient.  [employer] was to also fax paperwork to clarify

## 2023-05-20 ENCOUNTER — Telehealth: Payer: Self-pay

## 2023-05-20 ENCOUNTER — Other Ambulatory Visit: Payer: Self-pay | Admitting: Family Medicine

## 2023-05-20 ENCOUNTER — Other Ambulatory Visit (HOSPITAL_COMMUNITY): Payer: Self-pay

## 2023-05-20 NOTE — Progress Notes (Unsigned)
[facility])     [facility] Quality Pharmacy Team Statin Quality Measure Assessment  05/22/2023  Allen [name] 30-Jun-1946 [id number]  Per review of chart and payor information, patient has a diagnosis of cardiovascular disease but is not currently filling a statin prescription. This places patient into the [facility] (Statin Use In Patients with Cardiovascular Disease) measure for CMS.    Patient was outreached by our pharmacy team since he is overdue for refill of rosuvastatin. Per discussion with [facility] staff, rosuvastatin 40 mg was last filled on 04/19/2022 but it was not sold. Allen [name] admits that at some point he stopped taking  rosuvastatin because he endorsed pain. He denies worsening pain with dose increase. He reports that he still takes Repatha and rosuvastatin as prescribed. He takes rosuvastatin at bedtime to "sleep off side effects", but occasionally misses a dose 1-2 times per week. Educated patient on the importance of medication compliance and suggested a pillbox in which he seemed interested in using - reports wife manages medications. Since he self-discontinued rosuvastatin d/t pain, he is using a back stockpile of rosuvastatin and has ~ 20 -30 days supply remaining of medication.   Patient consented to getting rosuvastatin refilled closer to the time he runs out of medication. Allen [name] confirmed he uses [facility] store # [phone].   Discussed that someone from another branch of our pharmacy will reach out to follow up on medication adherence and to help facilitate refilling rosuvastatin. Next appointment with Dr. Wyline [name] on 09/01/2023. Please consider associating an accepted exclusion code (see options below) at the next appointment.      Component Value Date/Time   CHOL 147 12/31/2022 1042   CHOL 195 04/09/2022 1608   TRIG 206.0 (H) 12/31/2022 1042   HDL 38.30 (L) 12/31/2022 1042   HDL 36 (L) 04/09/2022 1608   CHOLHDL 4 12/31/2022 1042   VLDL 41.2 (H)  12/31/2022 1042   LDLCALC 112 (H) 04/09/2022 1608   LDLDIRECT 89.0 12/31/2022 1042    Please consider ONE of the following recommendations:  Initiate high intensity statin Atorvastatin 40mg  once daily, #90, 3 refills   Rosuvastatin 20mg  once daily, #90, 3 refills    Initiate moderate intensity  statin with reduced frequency if prior  statin intolerance 1x weekly, #13, 3 refills   2x weekly, #26, 3 refills   3x weekly, #39, 3 refills    Code for past statin intolerance  (required annually)  Provider Requirements: Must associate code during an office visit or telehealth encounter   Drug Induced Myopathy G72.0   Myalgia (SPC ONLY) M79.1   Myositis, unspecified M60.9   Myopathy, unspecified G72.9   Rhabdomyolysis M62.82   Thank you for allowing [facility] to be a part of this patient's care.   Cephus Shelling, PharmD Clinical Pharmacist [facility] Cell: [phone]

## 2023-05-26 ENCOUNTER — Other Ambulatory Visit (HOSPITAL_COMMUNITY): Payer: Self-pay

## 2023-06-02 ENCOUNTER — Other Ambulatory Visit: Payer: Self-pay

## 2023-06-02 DIAGNOSIS — I2694 Multiple subsegmental pulmonary emboli without acute cor pulmonale: Secondary | ICD-10-CM

## 2023-06-02 DIAGNOSIS — J9611 Chronic respiratory failure with hypoxia: Secondary | ICD-10-CM

## 2023-06-02 DIAGNOSIS — G4733 Obstructive sleep apnea (adult) (pediatric): Secondary | ICD-10-CM

## 2023-06-03 ENCOUNTER — Ambulatory Visit: Payer: Medicare Other | Admitting: Adult Health

## 2023-06-03 ENCOUNTER — Other Ambulatory Visit (HOSPITAL_COMMUNITY): Payer: Self-pay

## 2023-06-05 ENCOUNTER — Other Ambulatory Visit (HOSPITAL_COMMUNITY): Payer: Self-pay

## 2023-06-08 ENCOUNTER — Other Ambulatory Visit: Payer: Self-pay | Admitting: Cardiology

## 2023-06-13 ENCOUNTER — Other Ambulatory Visit (HOSPITAL_COMMUNITY): Payer: Self-pay

## 2023-07-03 DIAGNOSIS — M545 Low back pain, unspecified: Secondary | ICD-10-CM | POA: Diagnosis not present

## 2023-07-05 ENCOUNTER — Other Ambulatory Visit (HOSPITAL_COMMUNITY): Payer: Self-pay

## 2023-07-08 ENCOUNTER — Other Ambulatory Visit (HOSPITAL_COMMUNITY): Payer: Self-pay

## 2023-07-08 DIAGNOSIS — M545 Low back pain, unspecified: Secondary | ICD-10-CM | POA: Diagnosis not present

## 2023-07-09 ENCOUNTER — Other Ambulatory Visit: Payer: Medicare Other

## 2023-07-09 ENCOUNTER — Other Ambulatory Visit: Payer: Self-pay | Admitting: Family Medicine

## 2023-07-15 ENCOUNTER — Other Ambulatory Visit (HOSPITAL_COMMUNITY): Payer: Self-pay

## 2023-07-15 DIAGNOSIS — M545 Low back pain, unspecified: Secondary | ICD-10-CM | POA: Diagnosis not present

## 2023-07-17 ENCOUNTER — Telehealth: Payer: Self-pay | Admitting: Internal Medicine

## 2023-07-17 NOTE — Telephone Encounter (Signed)
Patient states accidentally left wheelchair out in the rain and now it's not of use. Patient states insurance informed him it would be covered if prescribed by PCP. Patient is requesting order for a [employer] sport power wheelchair be sent to [facility] supply [place].

## 2023-07-18 ENCOUNTER — Other Ambulatory Visit: Payer: Self-pay

## 2023-07-18 DIAGNOSIS — I999 Unspecified disorder of circulatory system: Secondary | ICD-10-CM

## 2023-07-18 DIAGNOSIS — Z7409 Other reduced mobility: Secondary | ICD-10-CM

## 2023-07-18 NOTE — Telephone Encounter (Signed)
Faxed order to [facility].

## 2023-07-29 DIAGNOSIS — M545 Low back pain, unspecified: Secondary | ICD-10-CM | POA: Diagnosis not present

## 2023-08-05 DIAGNOSIS — M545 Low back pain, unspecified: Secondary | ICD-10-CM | POA: Diagnosis not present

## 2023-08-12 ENCOUNTER — Other Ambulatory Visit: Payer: Self-pay | Admitting: Cardiovascular Disease

## 2023-08-12 DIAGNOSIS — M545 Low back pain, unspecified: Secondary | ICD-10-CM | POA: Diagnosis not present

## 2023-08-18 ENCOUNTER — Other Ambulatory Visit (HOSPITAL_COMMUNITY): Payer: Self-pay

## 2023-08-19 DIAGNOSIS — M545 Low back pain, unspecified: Secondary | ICD-10-CM | POA: Diagnosis not present

## 2023-08-25 ENCOUNTER — Other Ambulatory Visit (HOSPITAL_COMMUNITY): Payer: Self-pay

## 2023-08-26 DIAGNOSIS — M545 Low back pain, unspecified: Secondary | ICD-10-CM | POA: Diagnosis not present

## 2023-08-29 ENCOUNTER — Other Ambulatory Visit (HOSPITAL_COMMUNITY): Payer: Self-pay

## 2023-09-01 ENCOUNTER — Other Ambulatory Visit: Payer: Self-pay

## 2023-09-01 ENCOUNTER — Ambulatory Visit: Payer: Medicare Other | Attending: Cardiovascular Disease | Admitting: Cardiovascular Disease

## 2023-09-01 ENCOUNTER — Encounter: Payer: Self-pay | Admitting: Cardiovascular Disease

## 2023-09-01 VITALS — BP 124/80 | HR 64 | Ht 72.0 in | Wt 250.0 lb

## 2023-09-01 DIAGNOSIS — E785 Hyperlipidemia, unspecified: Secondary | ICD-10-CM | POA: Diagnosis not present

## 2023-09-01 DIAGNOSIS — I10 Essential (primary) hypertension: Secondary | ICD-10-CM

## 2023-09-01 DIAGNOSIS — I25118 Atherosclerotic heart disease of native coronary artery with other forms of angina pectoris: Secondary | ICD-10-CM | POA: Diagnosis not present

## 2023-09-01 DIAGNOSIS — I2510 Atherosclerotic heart disease of native coronary artery without angina pectoris: Secondary | ICD-10-CM

## 2023-09-01 DIAGNOSIS — I252 Old myocardial infarction: Secondary | ICD-10-CM

## 2023-09-01 DIAGNOSIS — I5020 Unspecified systolic (congestive) heart failure: Secondary | ICD-10-CM

## 2023-09-01 NOTE — Assessment & Plan Note (Signed)
History of essential hypertension with blood pressure measured today at 124/80.  He is on metoprolol, Benicar and hydrochlorothiazide.

## 2023-09-01 NOTE — H&P (View-Only) (Signed)
 09/01/2023 Allen [name]   07-Jan-1946  [id number]  Primary Physician Lula Olszewski, MD Primary Cardiologist: Runell Gess MD Nicholes Calamity, [place]  HPI:  Allen [name] is a 77 y.o.  mildly overweight married Caucasian male father of 6 (4 biologic, 2 adopted), grandfather of 6 grandchildren who  was referred by Dr. Beverely Low for cardiovascular evaluation because of chest pain.  He is retired Emergency planning/management officer in [place].  I last saw him in the office 08/21/2021.[name]  He has had cardiac catheterizations in the past, one remotely and 1 performed by Dr. Excell Seltzer 04/26/14 revealing noncritical CAD with normal LV function. His pain at that time was thought to be noncardiac. His problems include remote tobacco abuse having smoked 35-50 pack years and stopped 20 years ago. He also has a History of hypertension and hyperlipidemia. He has never had a heart attack or stroke. He's had chest pain which began a year ago that occurs monthly. He experienced 3 episodes over Christmas which were more worrisome.   I performed Myoview stress testing on him 12/05/2016 that showed mild ischemia in the RCA territory.  Apparently at his previous cath in 2015 he did have moderate RCA disease.  I elected to treat him medically at that time.  He was admitted 12/26/2019 with a non-STEMI and underwent cardiac catheterization the following day by Dr. Okey Dupre revealing minimal disease in the LAD, 80% mid AV groove circumflex with which was stented with a 2.5 mm x 18 mm long Medtronic resolute Onyx drug-eluting stent.  His RCA was occluded with left-to-right collaterals and his EF was preserved in the 50% range with inferior hypokinesia.  Since I saw him in the office 2 years ago he did have bilateral pulmonary emboli 05/23/2021 and has been placed on Eliquis oral anticoagulation.  He also describes nitroglycerin responsive exertional angina which has become more frequent since I last saw him.   Current Meds  Medication Sig    albuterol (VENTOLIN HFA) 108 (90 Base) MCG/ACT inhaler INHALE 2 PUFFS BY MOUTH EVERY 6 HOURS AS NEEDED FOR WHEEZING OR SHORTNESS OF BREATH   allopurinol (ZYLOPRIM) 100 MG tablet Take 1 tablet (100 mg total) by mouth daily.   aspirin EC 81 MG EC tablet Take 1 tablet (81 mg total) by mouth daily.   Budeson-Glycopyrrol-Formoterol (BREZTRI AEROSPHERE) 160-9-4.8 MCG/ACT AERO Inhale 2 puffs into the lungs in the morning and at bedtime.   ciclopirox (LOPROX) 0.77 % cream Apply topically 2 (two) times daily.   colchicine 0.6 MG tablet Take 1 tablet (0.6 mg total) by mouth daily. Until flare resolves. Do not take rosuvastatin or aspirin when taking this medication.   cyanocobalamin (VITAMIN B12) 1000 MCG tablet Take 1 tablet (1,000 mcg total) by mouth daily.   ELIQUIS 5 MG TABS tablet TAKE 1 TABLET BY MOUTH TWICE A DAY   Evolocumab (REPATHA SURECLICK) 140 MG/ML SOAJ INJECT 1 ML INTO THE SKIN EVERY 14 (FOURTEEN) DAYS.   FLUoxetine (PROZAC) 20 MG tablet Take 1 tablet (20 mg total) by mouth daily. To start: just half tablet daily for 2 weeks   furosemide (LASIX) 20 MG tablet TAKE 2 TABLETS FOR 3 DAYS THEN DECREASE TO 1 TABLET   isosorbide mononitrate (IMDUR) 60 MG 24 hr tablet Take 1 tablet (60 mg total) by mouth daily.   metFORMIN (GLUCOPHAGE) 500 MG tablet Take 1 tablet (500 mg total) by mouth 2 (two) times daily with a meal.   metoprolol succinate (TOPROL-XL) 50 MG 24  hr tablet TAKE 1 TABLET BY MOUTH DAILY. TAKE WITH OR IMMEDIATELY FOLLOWING A MEAL.   nitroGLYCERIN (NITROSTAT) 0.4 MG SL tablet PLACE 1 TABLET UNDER THE TONGUE EVERY 5 MINUTES AS NEEDED FOR CHEST PAIN. MAX 3, CALL 911   olmesartan-hydrochlorothiazide (BENICAR HCT) 20-12.5 MG tablet Take 1 tablet by mouth daily.   Olopatadine HCl (PATADAY) 0.2 % SOLN Apply 1 drop to each eye once daily.   rosuvastatin (CRESTOR) 20 MG tablet Take 1 tablet (20 mg total) by mouth daily.   Semaglutide-Weight Management 1.7 MG/0.75ML SOAJ Inject 1.7 mg into the skin  once a week for 28 days.   [START ON 09/12/2023] Semaglutide-Weight Management 2.4 MG/0.75ML SOAJ Inject 2.4 mg into the skin once a week.     Allergies  Allergen Reactions   Penicillins Hives and Other (See Comments)    Has patient had a PCN reaction causing immediate rash, facial/tongue/throat swelling, SOB or lightheadedness with hypotension: no Has patient had a PCN reaction causing severe rash involving mucus membranes or skin necrosis: no Has patient had a PCN reaction that required hospitalization no - childhood reaction Has patient had a PCN reaction occurring within the last 10 years: no - childhood reaction If all of the above answers are "NO", then may proceed with Cephalosporin use.    Sulfonamide Derivatives Hives   Sulfa Antibiotics Hives   Advil [Ibuprofen] Swelling and Other (See Comments)    Lip swelling   Atorvastatin Other (See Comments)    Muscle pain    Social History   Socioeconomic History   Marital status: Married    Spouse name: Not on file   Number of children: 6   Years of education: Not on file   Highest education level: Not on file  Occupational History   Occupation: retired  Tobacco Use   Smoking status: Former    Current packs/day: 0.00    Average packs/day: 2.0 packs/day for 35.0 years (70.0 ttl pk-yrs)    Types: Cigarettes    Start date: 10/21/1962    Quit date: 10/21/1997    Years since quitting: 25.8   Smokeless tobacco: Never   Tobacco comments:    2-3 ppd  Substance and Sexual Activity   Alcohol use: No    Alcohol/week: 0.0 standard drinks of alcohol   Drug use: No   Sexual activity: Yes    Birth control/protection: None  Other Topics Concern   Not on file  Social History Narrative   Married, 6 kids (2 adopted).   Retired Emergency planning/management officer.   Former smoker.  No signif alc.     No drugs.      Social Determinants of Health   Financial Resource Strain: Low Risk  (03/27/2023)   Overall Financial Resource Strain (CARDIA)     Difficulty of Paying Living Expenses: Not very hard  Food Insecurity: No Food Insecurity (03/27/2023)   Hunger Vital Sign    Worried About Running Out of Food in the Last Year: Never true    Ran Out of Food in the Last Year: Never true  Transportation Needs: No Transportation Needs (01/28/2023)   PRAPARE - Administrator, Civil Service (Medical): No    Lack of Transportation (Non-Medical): No  Physical Activity: Insufficiently Active (01/28/2023)   Exercise Vital Sign    Days of Exercise per Week: 1 day    Minutes of Exercise per Session: 50 min  Stress: No Stress Concern Present (01/28/2023)   [employer] of Occupational Health - Occupational Stress  Questionnaire    Feeling of Stress : Not at all  Social Connections: Moderately Integrated (01/28/2023)   Social Connection and Isolation Panel [NHANES]    Frequency of Communication with Friends and Family: More than three times a week    Frequency of Social Gatherings with Friends and Family: More than three times a week    Attends Religious Services: More than 4 times per year    Active Member of [employer] or Organizations: No    Attends Banker Meetings: Never    Marital Status: Married  Catering manager Violence: Not At Risk (01/28/2023)   Humiliation, Afraid, Rape, and Kick questionnaire    Fear of Current or Ex-Partner: No    Emotionally Abused: No    Physically Abused: No    Sexually Abused: No     Review of Systems: General: negative for chills, fever, night sweats or weight changes.  Cardiovascular: negative for chest pain, dyspnea on exertion, edema, orthopnea, palpitations, paroxysmal nocturnal dyspnea or shortness of breath Dermatological: negative for rash Respiratory: negative for cough or wheezing Urologic: negative for hematuria Abdominal: negative for nausea, vomiting, diarrhea, bright red blood per rectum, melena, or hematemesis Neurologic: negative for visual changes, syncope, or dizziness All  other systems reviewed and are otherwise negative except as noted above.    Blood pressure 124/80, pulse 64, height 6' (1.829 m), weight 250 lb (113.4 kg), SpO2 95%.  General appearance: alert and no distress Neck: no adenopathy, no carotid bruit, no JVD, supple, symmetrical, trachea midline, and thyroid not enlarged, symmetric, no tenderness/mass/nodules Lungs: clear to auscultation bilaterally Heart: regular rate and rhythm, S1, S2 normal, no murmur, click, rub or gallop Extremities: extremities normal, atraumatic, no cyanosis or edema Pulses: 2+ and symmetric Skin: Skin color, texture, turgor normal. No rashes or lesions Neurologic: Grossly normal  EKG EKG Interpretation Date/Time:  Monday September 01 2023 11:01:29 EST Ventricular Rate:  64 PR Interval:  158 QRS Duration:  108 QT Interval:  398 QTC Calculation: 410 R Axis:   30  Text Interpretation: Normal sinus rhythm Minimal voltage criteria for LVH, may be normal variant ( Cornell product ) Inferior infarct , age undetermined When compared with ECG of 24-May-2021 03:33, Inferior infarct is now Present Nonspecific T wave abnormality, worse in Lateral leads Confirmed by Nanetta Batty [phone]) on 09/01/2023 11:02:49 AM    ASSESSMENT AND PLAN:   Dyslipidemia, goal LDL below 70 History of dyslipidemia on Repatha which she takes sporadically.  He is statin intolerant.  His most recent lipid profile performed 12/31/2022 revealed total cholesterol of 47, LDL 112 and HDL 38.  He has agreed to be more compliant with his Repatha.  We will recheck a lipid liver profile in 3 months.  Essential hypertension History of essential hypertension with blood pressure measured today at 124/80.  He is on metoprolol, Benicar and hydrochlorothiazide.  Morbid obesity (HCC) History of morbid obesity with a BMI of 34.  He is lost 25 pounds on Wegovy.  History of non-ST elevation myocardial infarction (NSTEMI) History of CAD status [name] non-STEMI  12/26/2019.  He underwent cardiac catheterization by Dr. Okey Dupre revealing minimal disease in the LAD, 80% mid AV groove circumflex which was stented with a 2.5 mm x 18 mm long Medtronic resolute Onyx drug-eluting stent placed RCA was occluded with grade 3 left-to-right collaterals.  His EF was in the 50% range with inferior hypokinesia.  He is on 60 mg of Imdur.  He complains of reproducible nitrate responsive exercise-induced angina.  I do not think a imaging study will accurately determine whether or not he has developed new disease and therefore I recommended that he undergo cardiac catheterization.  I discussed this with Dr. [place].  HFrEF (heart failure with reduced ejection fraction) (HCC) History of mild LV dysfunction with an EF of 45 to 50% by 2D echo 05/25/19.  He is on GDMT and is minimally symptomatic.     Runell Gess MD FACP,FACC,FAHA, [facility] 09/01/2023 11:25 AM

## 2023-09-01 NOTE — Assessment & Plan Note (Signed)
History of CAD status post non-STEMI 12/26/2019.  He underwent cardiac catheterization by Dr. Okey Dupre revealing minimal disease in the LAD, 80% mid AV groove circumflex which was stented with a 2.5 mm x 18 mm long Medtronic resolute Onyx drug-eluting stent placed RCA was occluded with grade 3 left-to-right collaterals.  His EF was in the 50% range with inferior hypokinesia.  He is on 60 mg of Imdur.  He complains of reproducible nitrate responsive exercise-induced angina.  I do not think a imaging study will accurately determine whether or not he has developed new disease and therefore I recommended that he undergo cardiac catheterization.  I discussed this with Dr. [place].

## 2023-09-01 NOTE — Assessment & Plan Note (Signed)
History of mild LV dysfunction with an EF of 45 to 50% by 2D echo 05/25/19.  He is on GDMT and is minimally symptomatic.

## 2023-09-01 NOTE — Assessment & Plan Note (Signed)
History of morbid obesity with a BMI of 34.  He is lost 25 pounds on Wegovy.

## 2023-09-01 NOTE — Assessment & Plan Note (Signed)
History of dyslipidemia on Repatha which she takes sporadically.  He is statin intolerant.  His most recent lipid profile performed 12/31/2022 revealed total cholesterol of 47, LDL 112 and HDL 38.  He has agreed to be more compliant with his Repatha.  We will recheck a lipid liver profile in 3 months.

## 2023-09-01 NOTE — Progress Notes (Signed)
09/01/2023 Allen [name]   07-Jan-1946  [id number]  Primary Physician Allen Olszewski, MD Primary Cardiologist: Allen Gess MD Allen [name], [place]  HPI:  Allen [name] is a 77 y.o.  mildly overweight married Caucasian male father of 6 (4 biologic, 2 adopted), grandfather of 6 grandchildren who  was referred by Dr. Beverely [name] for cardiovascular evaluation because of chest pain.  He is retired Emergency planning/management officer in [place].  I last saw him in the office 08/21/2021.[name]  He has had cardiac catheterizations in the past, one remotely and 1 performed by Dr. Excell [name] 04/26/14 revealing noncritical CAD with normal LV function. His pain at that time was thought to be noncardiac. His problems include remote tobacco abuse having smoked 35-50 pack years and stopped 20 years ago. He also has a History of hypertension and hyperlipidemia. He has never had a heart attack or stroke. He's had chest pain which began a year ago that occurs monthly. He experienced 3 episodes over Christmas which were more worrisome.   I performed Myoview stress testing on him 12/05/2016 that showed mild ischemia in the RCA territory.  Apparently at his previous cath in 2015 he did have moderate RCA disease.  I elected to treat him medically at that time.  He was admitted 12/26/2019 with a non-STEMI and underwent cardiac catheterization the following day by Dr. Okey [name] revealing minimal disease in the LAD, 80% mid AV groove circumflex with which was stented with a 2.5 mm x 18 mm long Medtronic resolute Onyx drug-eluting stent.  His RCA was occluded with left-to-right collaterals and his EF was preserved in the 50% range with inferior hypokinesia.  Since I saw him in the office 2 years ago he did have bilateral pulmonary emboli 05/23/2021 and has been placed on Eliquis oral anticoagulation.  He also describes nitroglycerin responsive exertional angina which has become more frequent since I last saw him.   Current Meds  Medication Sig    albuterol (VENTOLIN HFA) 108 (90 Base) MCG/ACT inhaler INHALE 2 PUFFS BY MOUTH EVERY 6 HOURS AS NEEDED FOR WHEEZING OR SHORTNESS OF BREATH   allopurinol (ZYLOPRIM) 100 MG tablet Take 1 tablet (100 mg total) by mouth daily.   aspirin EC 81 MG EC tablet Take 1 tablet (81 mg total) by mouth daily.   Budeson-Glycopyrrol-Formoterol (BREZTRI AEROSPHERE) 160-9-4.8 MCG/ACT AERO Inhale 2 puffs into the lungs in the morning and at bedtime.   ciclopirox (LOPROX) 0.77 % cream Apply topically 2 (two) times daily.   colchicine 0.6 MG tablet Take 1 tablet (0.6 mg total) by mouth daily. Until flare resolves. Do not take rosuvastatin or aspirin when taking this medication.   cyanocobalamin (VITAMIN B12) 1000 MCG tablet Take 1 tablet (1,000 mcg total) by mouth daily.   ELIQUIS 5 MG TABS tablet TAKE 1 TABLET BY MOUTH TWICE A DAY   Evolocumab (REPATHA SURECLICK) 140 MG/ML SOAJ INJECT 1 ML INTO THE SKIN EVERY 14 (FOURTEEN) DAYS.   FLUoxetine (PROZAC) 20 MG tablet Take 1 tablet (20 mg total) by mouth daily. To start: just half tablet daily for 2 weeks   furosemide (LASIX) 20 MG tablet TAKE 2 TABLETS FOR 3 DAYS THEN DECREASE TO 1 TABLET   isosorbide mononitrate (IMDUR) 60 MG 24 hr tablet Take 1 tablet (60 mg total) by mouth daily.   metFORMIN (GLUCOPHAGE) 500 MG tablet Take 1 tablet (500 mg total) by mouth 2 (two) times daily with a meal.   metoprolol succinate (TOPROL-XL) 50 MG 24  hr tablet TAKE 1 TABLET BY MOUTH DAILY. TAKE WITH OR IMMEDIATELY FOLLOWING A MEAL.   nitroGLYCERIN (NITROSTAT) 0.4 MG SL tablet PLACE 1 TABLET UNDER THE TONGUE EVERY 5 MINUTES AS NEEDED FOR CHEST PAIN. MAX 3, CALL 911   olmesartan-hydrochlorothiazide (BENICAR HCT) 20-12.5 MG tablet Take 1 tablet by mouth daily.   Olopatadine HCl (PATADAY) 0.2 % SOLN Apply 1 drop to each eye once daily.   rosuvastatin (CRESTOR) 20 MG tablet Take 1 tablet (20 mg total) by mouth daily.   Semaglutide-Weight Management 1.7 MG/0.75ML SOAJ Inject 1.7 mg into the skin  once a week for 28 days.   [START ON 09/12/2023] Semaglutide-Weight Management 2.4 MG/0.75ML SOAJ Inject 2.4 mg into the skin once a week.     Allergies  Allergen Reactions   Penicillins Hives and Other (See Comments)    Has patient had a PCN reaction causing immediate rash, facial/tongue/throat swelling, SOB or lightheadedness with hypotension: no Has patient had a PCN reaction causing severe rash involving mucus membranes or skin necrosis: no Has patient had a PCN reaction that required hospitalization no - childhood reaction Has patient had a PCN reaction occurring within the last 10 years: no - childhood reaction If all of the above answers are "NO", then may proceed with Cephalosporin use.    Sulfonamide Derivatives Hives   Sulfa Antibiotics Hives   Advil [Ibuprofen] Swelling and Other (See Comments)    Lip swelling   Atorvastatin Other (See Comments)    Muscle pain    Social History   Socioeconomic History   Marital status: Married    Spouse name: Not on file   Number of children: 6   Years of education: Not on file   Highest education level: Not on file  Occupational History   Occupation: retired  Tobacco Use   Smoking status: Former    Current packs/day: 0.00    Average packs/day: 2.0 packs/day for 35.0 years (70.0 ttl pk-yrs)    Types: Cigarettes    Start date: 10/21/1962    Quit date: 10/21/1997    Years since quitting: 25.8   Smokeless tobacco: Never   Tobacco comments:    2-3 ppd  Substance and Sexual Activity   Alcohol use: No    Alcohol/week: 0.0 standard drinks of alcohol   Drug use: No   Sexual activity: Yes    Birth control/protection: None  Other Topics Concern   Not on file  Social History Narrative   Married, 6 kids (2 adopted).   Retired Emergency planning/management officer.   Former smoker.  No signif alc.     No drugs.      Social Determinants of Health   Financial Resource Strain: [name] Risk  (03/27/2023)   Overall Financial Resource Strain (CARDIA)     Difficulty of Paying Living Expenses: Not very hard  Food Insecurity: No Food Insecurity (03/27/2023)   Hunger Vital Sign    Worried About Running Out of Food in the Last Year: Never true    Ran Out of Food in the Last Year: Never true  Transportation Needs: No Transportation Needs (01/28/2023)   PRAPARE - Administrator, Civil Service (Medical): No    Lack of Transportation (Non-Medical): No  Physical Activity: Insufficiently Active (01/28/2023)   Exercise Vital Sign    Days of Exercise per Week: 1 day    Minutes of Exercise per Session: 50 min  Stress: No Stress Concern Present (01/28/2023)   [employer] of Occupational Health - Occupational Stress  Questionnaire    Feeling of Stress : Not at all  Social Connections: Moderately Integrated (01/28/2023)   Social Connection and Isolation Panel [NHANES]    Frequency of Communication with Friends and Family: More than three times a week    Frequency of Social Gatherings with Friends and Family: More than three times a week    Attends Religious Services: More than 4 times per year    Active Member of [employer] or Organizations: No    Attends Banker Meetings: Never    Marital Status: Married  Catering manager Violence: Not At Risk (01/28/2023)   Humiliation, Afraid, Rape, and Kick questionnaire    Fear of Current or Ex-Partner: No    Emotionally Abused: No    Physically Abused: No    Sexually Abused: No     Review of Systems: General: negative for chills, fever, night sweats or weight changes.  Cardiovascular: negative for chest pain, dyspnea on exertion, edema, orthopnea, palpitations, paroxysmal nocturnal dyspnea or shortness of breath Dermatological: negative for rash Respiratory: negative for cough or wheezing Urologic: negative for hematuria Abdominal: negative for nausea, vomiting, diarrhea, bright red blood per rectum, melena, or hematemesis Neurologic: negative for visual changes, syncope, or dizziness All  other systems reviewed and are otherwise negative except as noted above.    Blood pressure 124/80, pulse 64, height 6' (1.829 m), weight 250 lb (113.4 kg), SpO2 95%.  General appearance: alert and no distress Neck: no adenopathy, no carotid bruit, no JVD, supple, symmetrical, trachea midline, and thyroid not enlarged, symmetric, no tenderness/mass/nodules Lungs: clear to auscultation bilaterally Heart: regular rate and rhythm, S1, S2 normal, no murmur, click, rub or gallop Extremities: extremities normal, atraumatic, no cyanosis or edema Pulses: 2+ and symmetric Skin: Skin color, texture, turgor normal. No rashes or lesions Neurologic: Grossly normal  EKG EKG Interpretation Date/Time:  Monday September 01 2023 11:01:29 EST Ventricular Rate:  64 PR Interval:  158 QRS Duration:  108 QT Interval:  398 QTC Calculation: 410 R Axis:   30  Text Interpretation: Normal sinus rhythm Minimal voltage criteria for LVH, may be normal variant ( Cornell product ) Inferior infarct , age undetermined When compared with ECG of 24-May-2021 03:33, Inferior infarct is now Present Nonspecific T wave abnormality, worse in Lateral leads Confirmed by Nanetta Batty [phone]) on 09/01/2023 11:02:49 AM    ASSESSMENT AND PLAN:   Dyslipidemia, goal LDL below 70 History of dyslipidemia on Repatha which she takes sporadically.  He is statin intolerant.  His most recent lipid profile performed 12/31/2022 revealed total cholesterol of 47, LDL 112 and HDL 38.  He has agreed to be more compliant with his Repatha.  We will recheck a lipid liver profile in 3 months.  Essential hypertension History of essential hypertension with blood pressure measured today at 124/80.  He is on metoprolol, Benicar and hydrochlorothiazide.  Morbid obesity (HCC) History of morbid obesity with a BMI of 34.  He is lost 25 pounds on Wegovy.  History of non-ST elevation myocardial infarction (NSTEMI) History of CAD status [name] non-STEMI  12/26/2019.  He underwent cardiac catheterization by Dr. Okey [name] revealing minimal disease in the LAD, 80% mid AV groove circumflex which was stented with a 2.5 mm x 18 mm long Medtronic resolute Onyx drug-eluting stent placed RCA was occluded with grade 3 left-to-right collaterals.  His EF was in the 50% range with inferior hypokinesia.  He is on 60 mg of Imdur.  He complains of reproducible nitrate responsive exercise-induced angina.  I do not think a imaging study will accurately determine whether or not he has developed new disease and therefore I recommended that he undergo cardiac catheterization.  I discussed this with Dr. [place].  HFrEF (heart failure with reduced ejection fraction) (HCC) History of mild LV dysfunction with an EF of 45 to 50% by 2D echo 05/25/19.  He is on GDMT and is minimally symptomatic.     Allen Gess MD FACP,FACC,FAHA, [facility] 09/01/2023 11:25 AM

## 2023-09-01 NOTE — Patient Instructions (Addendum)
Lab Work: Nutritional therapist, CBC today.  Your physician recommends that you return for lab work in: 3 months for Lipid and Liver FASTING.   If you have labs (blood work) drawn today and your tests are completely normal, you will receive your results only by: MyChart Message (if you have MyChart) OR A paper copy in the mail If you have any lab test that is abnormal or we need to change your treatment, we will call you to review the results.   Testing/Procedures:       Cardiac/Peripheral Catheterization   You are scheduled for a Cardiac Catheterization on Friday, November 15 with Dr. Peter [place].  1. Please arrive at the [facility] (Main Entrance A) at [facility]: [address] [place], [place] [address] at 5:30 AM (This time is 2 hour(s) before your procedure to ensure your preparation). Free valet parking service is available. You will check in at ADMITTING. The support person will be asked to wait in the waiting room.  It is OK to have someone drop you off and come back when you are ready to be discharged.        Special note: Every effort is made to have your procedure done on time. Please understand that emergencies sometimes delay scheduled procedures.  2. Diet: Do not eat solid foods after midnight.  You may have clear liquids until 5 AM the day of the procedure.  3. Labs: Drawn 09/01/2023. 4. Medication instructions in preparation for your procedure:   Contrast Allergy: No   Stop taking Eliquis (Apixiban) on Wednesday, November 13.  Stop taking, Lasix (Furosemide)  Friday, November 15,, Benicar  Friday, November 15,  Hold weight loss medication on day of procedure and then resume the next week.  Do not take Diabetes Med Glucophage (Metformin) on the day of the procedure and HOLD 48 HOURS AFTER THE PROCEDURE.  On the morning of your procedure, take Aspirin 81 mg and any morning medicines NOT listed above.  You may use sips of water.  5. Plan to go home the same day,  you will only stay overnight if medically necessary. 6. You MUST have a responsible adult to drive you home. 7. An adult MUST be with you the first 24 hours after you arrive home. 8. Bring a current list of your medications, and the last time and date medication taken. 9. Bring ID and current insurance cards. 10.Please wear clothes that are easy to get on and off and wear slip-on shoes.  Thank you for allowing [place] to care for you!   -- [facility]    Follow-Up: At [facility], you and your health needs are our priority.  As part of our continuing mission to provide you with exceptional heart care, we have created designated Provider Care Teams.  These Care Teams include your primary Cardiologist (physician) and Advanced Practice Providers (APPs -  Physician Assistants and Nurse Practitioners) who all work together to provide you with the care you need, when you need it.  We recommend signing up for the patient portal called "MyChart".  Sign up information is provided on this After Visit Summary.  MyChart is used to connect with patients for Virtual Visits (Telemedicine).  Patients are able to view lab/test results, encounter notes, upcoming appointments, etc.  Non-urgent messages can be sent to your provider as well.   To learn more about what you can do with MyChart, go to [email or web address].    Your next appointment:  3 month(s)  Provider:   Nanetta Batty, MD

## 2023-09-02 LAB — BASIC METABOLIC PANEL
BUN/Creatinine Ratio: 17 (ref 10–24)
BUN: 14 mg/dL (ref 8–27)
CO2: 23 mmol/L (ref 20–29)
Calcium: 9.8 mg/dL (ref 8.6–10.2)
Chloride: 100 mmol/L (ref 96–106)
Creatinine, Ser: 0.83 mg/dL (ref 0.76–1.27)
Glucose: 85 mg/dL (ref 70–99)
Potassium: 5 mmol/L (ref 3.5–5.2)
Sodium: 141 mmol/L (ref 134–144)
eGFR: 92 mL/min/{1.73_m2} (ref >59)

## 2023-09-02 LAB — CBC
Hematocrit: 45.2 % (ref 37.5–51.0)
Hemoglobin: 14.6 g/dL (ref 13.0–17.7)
MCH: 27.8 pg (ref 26.6–33.0)
MCHC: 32.3 g/dL (ref 31.5–35.7)
MCV: 86 fL (ref 79–97)
Platelets: 253 10*3/uL (ref 150–450)
RBC: 5.25 x10E6/uL (ref 4.14–5.80)
RDW: 14 % (ref 11.6–15.4)
WBC: 7.2 10*3/uL (ref 3.4–10.8)

## 2023-09-04 ENCOUNTER — Telehealth: Payer: Self-pay | Admitting: *Deleted

## 2023-09-04 NOTE — Telephone Encounter (Signed)
Cardiac Catheterization scheduled at [facility] for: Friday September 05, 2023 7:30 AM Arrival time [facility] Main Entrance A at: 5:30 AM  Nothing to eat after midnight prior to procedure, clear liquids until 5 AM day of procedure.  Medication instructions: -Hold:  Eliquis-pt reports none since AM 09/02/23 and knows to continue to hold until post procedure  Lasix/Olmesartan-HCT-AM of procedure  Metformin-day of procedure and 48 hours post procedure  Semiglutide-weekly on Fridays-will not take until post procedure -Other usual morning medications can be taken with sips of water including aspirin 81 mg.  Plan to go home the same day, you will only stay overnight if medically necessary.  You must have responsible adult to drive you home.  Someone must be with you the first 24 hours after you arrive home.  Reviewed procedure instructions with patient.

## 2023-09-05 ENCOUNTER — Other Ambulatory Visit: Payer: Self-pay

## 2023-09-05 ENCOUNTER — Encounter (HOSPITAL_COMMUNITY)
Admission: RE | Disposition: A | Discharge status: Home / Self Care | Payer: Self-pay | Source: Home / Self Care | Attending: Cardiology

## 2023-09-05 ENCOUNTER — Ambulatory Visit (HOSPITAL_COMMUNITY)
Admission: RE | Admit: 2023-09-05 | Discharge: 2023-09-05 | Disposition: A | Discharge status: Home / Self Care | Payer: Medicare Other | Attending: Cardiology | Admitting: Cardiology

## 2023-09-05 ENCOUNTER — Other Ambulatory Visit (HOSPITAL_COMMUNITY): Payer: Self-pay

## 2023-09-05 ENCOUNTER — Encounter (HOSPITAL_COMMUNITY): Payer: Self-pay | Admitting: Cardiology

## 2023-09-05 DIAGNOSIS — Z955 Presence of coronary angioplasty implant and graft: Secondary | ICD-10-CM | POA: Diagnosis not present

## 2023-09-05 DIAGNOSIS — I25119 Atherosclerotic heart disease of native coronary artery with unspecified angina pectoris: Secondary | ICD-10-CM | POA: Diagnosis not present

## 2023-09-05 DIAGNOSIS — I5020 Unspecified systolic (congestive) heart failure: Secondary | ICD-10-CM | POA: Diagnosis not present

## 2023-09-05 DIAGNOSIS — Z79899 Other long term (current) drug therapy: Secondary | ICD-10-CM | POA: Insufficient documentation

## 2023-09-05 DIAGNOSIS — Z7901 Long term (current) use of anticoagulants: Secondary | ICD-10-CM | POA: Insufficient documentation

## 2023-09-05 DIAGNOSIS — E785 Hyperlipidemia, unspecified: Secondary | ICD-10-CM | POA: Insufficient documentation

## 2023-09-05 DIAGNOSIS — I252 Old myocardial infarction: Secondary | ICD-10-CM | POA: Insufficient documentation

## 2023-09-05 DIAGNOSIS — I2584 Coronary atherosclerosis due to calcified coronary lesion: Secondary | ICD-10-CM | POA: Insufficient documentation

## 2023-09-05 DIAGNOSIS — Z87891 Personal history of nicotine dependence: Secondary | ICD-10-CM | POA: Insufficient documentation

## 2023-09-05 DIAGNOSIS — I110 Hypertensive heart disease with heart failure: Secondary | ICD-10-CM | POA: Diagnosis not present

## 2023-09-05 DIAGNOSIS — I2510 Atherosclerotic heart disease of native coronary artery without angina pectoris: Secondary | ICD-10-CM

## 2023-09-05 DIAGNOSIS — Z6834 Body mass index (BMI) 34.0-34.9, adult: Secondary | ICD-10-CM | POA: Diagnosis not present

## 2023-09-05 DIAGNOSIS — I25118 Atherosclerotic heart disease of native coronary artery with other forms of angina pectoris: Secondary | ICD-10-CM

## 2023-09-05 DIAGNOSIS — I2582 Chronic total occlusion of coronary artery: Secondary | ICD-10-CM | POA: Insufficient documentation

## 2023-09-05 HISTORY — PX: LEFT HEART CATH AND CORONARY ANGIOGRAPHY: CATH118249

## 2023-09-05 HISTORY — PX: CORONARY STENT INTERVENTION: CATH118234

## 2023-09-05 HISTORY — PX: CORONARY PRESSURE/FFR STUDY: CATH118243

## 2023-09-05 LAB — POCT ACTIVATED CLOTTING TIME
Activated Clotting Time: 256 s
Activated Clotting Time: 245 s
Activated Clotting Time: 832 s

## 2023-09-05 LAB — GLUCOSE, CAPILLARY
Glucose-Capillary: 90 mg/dL (ref 70–99)
Glucose-Capillary: 102 mg/dL — ABNORMAL HIGH (ref 70–99)

## 2023-09-05 SURGERY — LEFT HEART CATH AND CORONARY ANGIOGRAPHY
Anesthesia: LOCAL

## 2023-09-05 MED ORDER — PANTOPRAZOLE SODIUM 40 MG PO TBEC
40.0000 mg | DELAYED_RELEASE_TABLET | Freq: Every day | ORAL | 0 refills | Status: AC
  Filled 2023-09-05: qty 30, 30d supply, fill #0

## 2023-09-05 MED ORDER — HEPARIN SODIUM (PORCINE) 1000 UNIT/ML IJ SOLN
INTRAMUSCULAR | Status: AC
  Filled 2023-09-05: qty 10

## 2023-09-05 MED ORDER — HYDRALAZINE HCL 20 MG/ML IJ SOLN: 10.0000 mg | INTRAMUSCULAR | Status: AC | PRN

## 2023-09-05 MED ORDER — HEPARIN (PORCINE) IN NACL 1000-0.9 UT/500ML-% IV SOLN
INTRAVENOUS | Status: DC | PRN
  Administered 2023-09-05: 1000 mL via INTRA_ARTERIAL

## 2023-09-05 MED ORDER — ACETAMINOPHEN 325 MG PO TABS: 650.0000 mg | ORAL_TABLET | ORAL | Status: DC | PRN

## 2023-09-05 MED ORDER — CLOPIDOGREL BISULFATE 75 MG PO TABS
75.0000 mg | ORAL_TABLET | Freq: Every day | ORAL | 3 refills | Status: DC
  Filled 2023-09-05: qty 30, 30d supply, fill #0

## 2023-09-05 MED ORDER — MIDAZOLAM HCL 2 MG/2ML IJ SOLN
INTRAMUSCULAR | Status: AC
  Filled 2023-09-05: qty 2

## 2023-09-05 MED ORDER — ONDANSETRON HCL 4 MG/2ML IJ SOLN: 4.0000 mg | Freq: Four times a day (QID) | INTRAMUSCULAR | Status: DC | PRN

## 2023-09-05 MED ORDER — FAMOTIDINE IN NACL 20-0.9 MG/50ML-% IV SOLN
INTRAVENOUS | Status: DC | PRN
  Administered 2023-09-05: 20 mg via INTRAVENOUS

## 2023-09-05 MED ORDER — FENTANYL CITRATE (PF) 100 MCG/2ML IJ SOLN
INTRAMUSCULAR | Status: DC | PRN
  Administered 2023-09-05: 25 ug via INTRAVENOUS

## 2023-09-05 MED ORDER — VERAPAMIL HCL 2.5 MG/ML IV SOLN
INTRAVENOUS | Status: AC
  Filled 2023-09-05: qty 2

## 2023-09-05 MED ORDER — SODIUM CHLORIDE 0.9 % WEIGHT BASED INFUSION: 1.0000 mL/kg/h | INTRAVENOUS | Status: DC

## 2023-09-05 MED ORDER — NITROGLYCERIN 1 MG/10 ML FOR IR/CATH LAB
INTRA_ARTERIAL | Status: DC | PRN
  Administered 2023-09-05 (×2): 200 ug via INTRACORONARY

## 2023-09-05 MED ORDER — ASPIRIN 81 MG PO CHEW
81.0000 mg | CHEWABLE_TABLET | ORAL | Status: DC
Start: 2023-09-05 — End: 2023-09-05

## 2023-09-05 MED ORDER — SODIUM CHLORIDE 0.9% FLUSH: 3.0000 mL | Freq: Two times a day (BID) | INTRAVENOUS | Status: DC

## 2023-09-05 MED ORDER — MIDAZOLAM HCL 2 MG/2ML IJ SOLN
INTRAMUSCULAR | Status: DC | PRN
  Administered 2023-09-05: 1 mg via INTRAVENOUS

## 2023-09-05 MED ORDER — HEPARIN SODIUM (PORCINE) 1000 UNIT/ML IJ SOLN
INTRAMUSCULAR | Status: DC | PRN
  Administered 2023-09-05: 5000 [IU] via INTRAVENOUS
  Administered 2023-09-05: 2000 [IU] via INTRAVENOUS
  Administered 2023-09-05: 5000 [IU] via INTRAVENOUS
  Administered 2023-09-05: 3000 [IU] via INTRAVENOUS

## 2023-09-05 MED ORDER — SODIUM CHLORIDE 0.9% FLUSH: 3.0000 mL | INTRAVENOUS | Status: DC | PRN

## 2023-09-05 MED ORDER — FUROSEMIDE 20 MG PO TABS: 40.0000 mg | ORAL_TABLET | Freq: Every day | ORAL | Status: AC

## 2023-09-05 MED ORDER — CLOPIDOGREL BISULFATE 75 MG PO TABS: 75.0000 mg | ORAL_TABLET | Freq: Every day | ORAL | Status: DC

## 2023-09-05 MED ORDER — SODIUM CHLORIDE 0.9 % IV SOLN: 250.0000 mL | INTRAVENOUS | Status: DC | PRN

## 2023-09-05 MED ORDER — FAMOTIDINE IN NACL 20-0.9 MG/50ML-% IV SOLN
INTRAVENOUS | Status: AC
  Filled 2023-09-05: qty 50

## 2023-09-05 MED ORDER — ASPIRIN 81 MG PO CHEW: 81.0000 mg | CHEWABLE_TABLET | Freq: Every day | ORAL | Status: DC

## 2023-09-05 MED ORDER — CLOPIDOGREL BISULFATE 300 MG PO TABS
ORAL_TABLET | ORAL | Status: DC | PRN
  Administered 2023-09-05: 600 mg via ORAL

## 2023-09-05 MED ORDER — FENTANYL CITRATE (PF) 100 MCG/2ML IJ SOLN
INTRAMUSCULAR | Status: AC
  Filled 2023-09-05: qty 2

## 2023-09-05 MED ORDER — IOHEXOL 350 MG/ML SOLN
INTRAVENOUS | Status: DC | PRN
  Administered 2023-09-05: 175 mL via INTRA_ARTERIAL

## 2023-09-05 MED ORDER — LIDOCAINE HCL (PF) 1 % IJ SOLN
INTRAMUSCULAR | Status: AC
  Filled 2023-09-05: qty 30

## 2023-09-05 MED ORDER — SODIUM CHLORIDE 0.9 % WEIGHT BASED INFUSION
3.0000 mL/kg/h | INTRAVENOUS | Status: DC
  Administered 2023-09-05: 3 mL/kg/h via INTRAVENOUS

## 2023-09-05 MED ORDER — NITROGLYCERIN 1 MG/10 ML FOR IR/CATH LAB
INTRA_ARTERIAL | Status: AC
  Filled 2023-09-05: qty 10

## 2023-09-05 MED ORDER — VERAPAMIL HCL 2.5 MG/ML IV SOLN
INTRAVENOUS | Status: DC | PRN
  Administered 2023-09-05: 10 mL via INTRA_ARTERIAL

## 2023-09-05 MED ORDER — ASPIRIN 81 MG PO TBEC
81.0000 mg | DELAYED_RELEASE_TABLET | Freq: Every day | ORAL | 0 refills | Status: DC
  Filled 2023-09-05: qty 30, 30d supply, fill #0

## 2023-09-05 MED ORDER — LIDOCAINE HCL (PF) 1 % IJ SOLN
INTRAMUSCULAR | Status: DC | PRN
  Administered 2023-09-05: 2 mL

## 2023-09-05 SURGICAL SUPPLY — 19 items
BALLN EMERGE MR 2.5X12 (BALLOONS) ×1
BALLN ~~LOC~~ EMERGE MR 3.5X12 (BALLOONS) ×1
BALLOON EMERGE MR 2.5X12 (BALLOONS) IMPLANT
BALLOON ~~LOC~~ EMERGE MR 3.5X12 (BALLOONS) IMPLANT
CATH 5FR JL3.5 JR4 ANG PIG MP (CATHETERS) IMPLANT
CATH VISTA GUIDE 6FR XBLAD3.5 (CATHETERS) IMPLANT
DEVICE RAD COMP TR BAND LRG (VASCULAR PRODUCTS) IMPLANT
GLIDESHEATH SLEND SS 6F .021 (SHEATH) IMPLANT
GUIDEWIRE INQWIRE 1.5J.035X260 (WIRE) IMPLANT
GUIDEWIRE PRESSURE X 175 (WIRE) IMPLANT
INQWIRE 1.5J .035X260CM (WIRE) ×1
KIT ENCORE 26 ADVANTAGE (KITS) IMPLANT
KIT ESSENTIALS PG (KITS) IMPLANT
KIT SYRINGE INJ CVI SPIKEX1 (MISCELLANEOUS) IMPLANT
PACK CARDIAC CATHETERIZATION (CUSTOM PROCEDURE TRAY) ×2 IMPLANT
SET ATX-X65L (MISCELLANEOUS) IMPLANT
STENT SYNERGY XD 3.0X12 (Permanent Stent) IMPLANT
SYNERGY XD 3.0X12 (Permanent Stent) ×1 IMPLANT
TUBING CIL FLEX 10 FLL-RA (TUBING) IMPLANT

## 2023-09-05 NOTE — Discharge Summary (Signed)
Discharge Summary for Same Day PCI   Patient ID: Allen [name] MRN: [id number]; DOB: 03-11-1946  Admit date: 09/05/2023 Discharge date: 09/05/2023  Primary Care Provider: Lula Olszewski, MD  Primary Cardiologist: Nanetta Batty, MD  Primary Electrophysiologist:  None   Discharge Diagnoses    Active Problems:   * No active hospital problems. *    Diagnostic Studies/Procedures    Cardiac Catheterization 09/05/2023:  09/05/23 [facility]    Prox Cx lesion is 40% stenosed.   Mid RCA lesion is 100% stenosed.   Prox RCA lesion is 45% stenosed.   Dist LM lesion is 20% stenosed.   Prox LAD lesion is 60% stenosed.   1st Sept lesion is 90% stenosed.   Non-stenotic Mid Cx lesion was previously treated.   A drug-eluting stent was successfully placed using a SYNERGY XD 3.0X12.   Post intervention, there is a 0% residual stenosis.   There is moderate left ventricular systolic dysfunction.   LV end diastolic pressure is moderately elevated.   The left ventricular ejection fraction is 35-45% by visual estimate.   Recommend to resume Apixaban, at currently prescribed dose and frequency on 09/05/2023.   Recommend concurrent antiplatelet therapy of Aspirin 81 mg for 1 month and Clopidogrel 75mg  daily for 6 months .   2 vessel obstructive CAD. The mid LCx stent is widely patent. The mid RCA has CTO with grade 3 left to right collaterals from the LAD. There was a borderline lesion in the proximal LAD but RFR was abnormal at 0.79. He also has severe stenosis in the first septal perforator.  Moderate LV dysfunction. EF estimated at 40-45% with inferior HK.  Moderately elevated LVEDP 27 mm Hg Successful PCI of the proximal LAD with DES   Plan: DAPT with ASA for one month, Plavix for 6 months. Resume Eliquis this evening. Anticipate same day discharge. Optimize CHF therapy as elevated EDP may contribute to his anginal symptoms. If he were to have refractory angina still we could consider CTO PCI  of the RCA.   Diagnostic Dominance: Right  Intervention   _____________   History of Present Illness     Allen [name] is a 77 y.o. male with CAD (minimal disease in the LAD, 80% mid AV groove circumflex with which was stented with a 2.5 mm x 18 mm long Medtronic resolute Onyx drug-eluting stent.  His RCA was occluded with left-to-right collaterals and his EF was preserved in the 50% range with inferior hypokinesia), HFmrEF, carotid artery disease, left subclavian artery stenosis, PE, hypertension, hyperlipidemia, COPD, OSA. Patient was seen by Dr. Allyson Sabal in clinic on 09/01/23 with reproducible nitrate responsive exercise induced angina. As a result, cardiac catheterization was arranged for further evaluation.  Hospital Course     The patient underwent cardiac cath as noted above with Dr. [place]. Plan for triple therapy with ASA/Plavix/Eliquis x1 month. Stop ASA after 1 month and continue Plavix with Eliquis for a minimum of 6 months. The patient was seen by cardiac rehab while in short stay. There were no observed complications post cath. Radial cath site was re-evaluated prior to discharge and found to be stable without any complications. Instructions/precautions regarding cath site care were given prior to discharge.  Allen [name] was seen by Dr. [place] and determined stable for discharge home. Follow up with our office has been arranged. Medications are listed below. Pertinent changes include:  Plan for triple therapy with ASA/Plavix/Eliquis x1 month. Stop ASA after 1 month and continue Plavix  with Eliquis for a minimum of 6 months. Increase Lasix to 40mg  daily.    _____________  Cath/PCI Registry Performance & Quality Measures: Aspirin prescribed? - Yes ADP Receptor Inhibitor (Plavix/Clopidogrel, Brilinta/Ticagrelor or Effient/Prasugrel) prescribed (includes medically managed patients)? - Yes High Intensity Statin (Lipitor 40-80mg  or Crestor 20-40mg ) prescribed? - Yes For EF  <40%, was ACEI/ARB prescribed? - Not Applicable (EF >/= 40%) For EF <40%, Aldosterone Antagonist (Spironolactone or Eplerenone) prescribed? - Not Applicable (EF >/= 40%) Cardiac Rehab Phase II ordered (Included Medically managed Patients)? - Yes  _____________   Discharge Vitals Blood pressure (!) 124/59, pulse (!) 55, temperature 98.5 F (36.9 C), temperature source Oral, resp. rate 18, height 5\' 11"  (1.803 m), weight 112.5 kg, SpO2 96%.  Filed Weights   09/05/23 0605  Weight: 112.5 kg    Last Labs & Radiologic Studies    CBC No results for input(s): "WBC", "NEUTROABS", "HGB", "HCT", "MCV", "PLT" in the last 72 hours. Basic Metabolic Panel No results for input(s): "NA", "K", "CL", "CO2", "GLUCOSE", "BUN", "CREATININE", "CALCIUM", "MG", "PHOS" in the last 72 hours. Liver Function Tests No results for input(s): "AST", "ALT", "ALKPHOS", "BILITOT", "PROT", "ALBUMIN" in the last 72 hours. No results for input(s): "LIPASE", "AMYLASE" in the last 72 hours. High Sensitivity Troponin:   No results for input(s): "TROPONINIHS" in the last 720 hours.  BNP Invalid input(s): "POCBNP" D-Dimer No results for input(s): "DDIMER" in the last 72 hours. Hemoglobin A1C No results for input(s): "HGBA1C" in the last 72 hours. Fasting Lipid Panel No results for input(s): "CHOL", "HDL", "LDLCALC", "TRIG", "CHOLHDL", "LDLDIRECT" in the last 72 hours. Thyroid Function Tests No results for input(s): "TSH", "T4TOTAL", "T3FREE", "THYROIDAB" in the last 72 hours.  Invalid input(s): "FREET3" _____________  CARDIAC CATHETERIZATION  Result Date: 09/05/2023   Prox Cx lesion is 40% stenosed.   Mid RCA lesion is 100% stenosed.   Prox RCA lesion is 45% stenosed.   Dist LM lesion is 20% stenosed.   Prox LAD lesion is 60% stenosed.   1st Sept lesion is 90% stenosed.   Non-stenotic Mid Cx lesion was previously treated.   A drug-eluting stent was successfully placed using a SYNERGY XD 3.0X12.   Post intervention,  there is a 0% residual stenosis.   There is moderate left ventricular systolic dysfunction.   LV end diastolic pressure is moderately elevated.   The left ventricular ejection fraction is 35-45% by visual estimate.   Recommend to resume Apixaban, at currently prescribed dose and frequency on 09/05/2023.   Recommend concurrent antiplatelet therapy of Aspirin 81 mg for 1 month and Clopidogrel 75mg  daily for 6 months . 2 vessel obstructive CAD. The mid LCx stent is widely patent. The mid RCA has CTO with grade 3 left to right collaterals from the LAD. There was a borderline lesion in the proximal LAD but RFR was abnormal at 0.79. He also has severe stenosis in the first septal perforator. Moderate LV dysfunction. EF estimated at 40-45% with inferior HK. Moderately elevated LVEDP 27 mm Hg Successful PCI of the proximal LAD with DES Plan: DAPT with ASA for one month, Plavix for 6 months. Resume Eliquis this evening. Anticipate same day discharge. Optimize CHF therapy as elevated EDP may contribute to his anginal symptoms. If he were to have refractory angina still we could consider CTO PCI of the RCA.    Disposition   Pt is being discharged home today in good condition.  Follow-up Plans & Appointments     Discharge Instructions  Amb Referral to Cardiac Rehabilitation   Complete by: As directed    Diagnosis: Coronary Stents   After initial evaluation and assessments completed: Virtual Based Care may be provided alone or in conjunction with Phase 2 Cardiac Rehab based on patient barriers.: Yes   Intensive Cardiac Rehabilitation (ICR) [facility] location only OR Traditional Cardiac Rehabilitation (TCR) *If criteria for ICR are not met will enroll in TCR [facility] only): Yes        Discharge Medications   Allergies as of 09/05/2023       Reactions   Penicillins Hives, Other (See Comments)   Has patient had a PCN reaction causing immediate rash, facial/tongue/throat swelling, SOB or lightheadedness with  hypotension: no Has patient had a PCN reaction causing severe rash involving mucus membranes or skin necrosis: no Has patient had a PCN reaction that required hospitalization no - childhood reaction Has patient had a PCN reaction occurring within the last 10 years: no - childhood reaction If all of the above answers are "NO", then may proceed with Cephalosporin use.   Sulfonamide Derivatives Hives   Sulfa Antibiotics Hives   Advil [ibuprofen] Swelling, Other (See Comments)   Lip swelling   Atorvastatin Other (See Comments)   Muscle pain        Medication List     TAKE these medications    albuterol 108 (90 Base) MCG/ACT inhaler Commonly known as: VENTOLIN HFA INHALE 2 PUFFS BY MOUTH EVERY 6 HOURS AS NEEDED FOR WHEEZING OR SHORTNESS OF BREATH   allopurinol 100 MG tablet Commonly known as: ZYLOPRIM Take 1 tablet (100 mg total) by mouth daily. What changed:  when to take this reasons to take this   aspirin EC 81 MG tablet Take 1 tablet (81 mg total) by mouth daily. Take for 30 days following catheterization, then stop. What changed: additional instructions   Breztri Aerosphere 160-9-4.8 MCG/ACT Aero Generic drug: Budeson-Glycopyrrol-Formoterol Inhale 2 puffs into the lungs in the morning and at bedtime. What changed: when to take this   ciclopirox 0.77 % cream Commonly known as: LOPROX Apply topically 2 (two) times daily. What changed:  how much to take when to take this reasons to take this   clopidogrel 75 MG tablet Commonly known as: PLAVIX Take 1 tablet (75 mg total) by mouth daily with breakfast. Start taking on: September 06, 2023   colchicine 0.6 MG tablet Take 1 tablet (0.6 mg total) by mouth daily. Until flare resolves. Do not take rosuvastatin or aspirin when taking this medication. What changed:  when to take this reasons to take this   cyanocobalamin 1000 MCG tablet Commonly known as: VITAMIN B12 Take 1 tablet (1,000 mcg total) by mouth  daily. What changed: when to take this   Eliquis 5 MG Tabs tablet Generic drug: apixaban TAKE 1 TABLET BY MOUTH TWICE A DAY   FLUoxetine 20 MG tablet Commonly known as: PROZAC Take 1 tablet (20 mg total) by mouth daily. To start: just half tablet daily for 2 weeks What changed: when to take this   furosemide 20 MG tablet Commonly known as: LASIX Take 2 tablets (40 mg total) by mouth daily. What changed: See the new instructions.   isosorbide mononitrate 60 MG 24 hr tablet Commonly known as: IMDUR Take 1 tablet (60 mg total) by mouth daily. What changed: Another medication with the same name was removed. Continue taking this medication, and follow the directions you see here.   metFORMIN 500 MG tablet Commonly known as: GLUCOPHAGE Take  1 tablet (500 mg total) by mouth 2 (two) times daily with a meal.   metoprolol succinate 50 MG 24 hr tablet Commonly known as: TOPROL-XL TAKE 1 TABLET BY MOUTH DAILY. TAKE WITH OR IMMEDIATELY FOLLOWING A MEAL. What changed: when to take this   nitroGLYCERIN 0.4 MG SL tablet Commonly known as: NITROSTAT PLACE 1 TABLET UNDER THE TONGUE EVERY 5 MINUTES AS NEEDED FOR CHEST PAIN. MAX 3, CALL 911   olmesartan-hydrochlorothiazide 20-12.5 MG tablet Commonly known as: BENICAR HCT Take 1 tablet by mouth daily.   Olopatadine HCl 0.2 % Soln Commonly known as: Pataday Apply 1 drop to each eye once daily. What changed:  how much to take how to take this when to take this reasons to take this additional instructions   Repatha SureClick 140 MG/ML Soaj Generic drug: Evolocumab INJECT 1 ML INTO THE SKIN EVERY 14 (FOURTEEN) DAYS.   rosuvastatin 20 MG tablet Commonly known as: Crestor Take 1 tablet (20 mg total) by mouth daily. What changed: when to take this   Wegovy 1.7 MG/0.75ML Soaj Generic drug: Semaglutide-Weight Management Inject 1.7 mg into the skin once a week for 28 days. What changed: when to take this   Wegovy 2.4 MG/0.75ML  Soaj Generic drug: Semaglutide-Weight Management Inject 2.4 mg into the skin once a week. Start taking on: September 12, 2023 What changed: Another medication with the same name was changed. Make sure you understand how and when to take each.           Allergies Allergies  Allergen Reactions   Penicillins Hives and Other (See Comments)    Has patient had a PCN reaction causing immediate rash, facial/tongue/throat swelling, SOB or lightheadedness with hypotension: no Has patient had a PCN reaction causing severe rash involving mucus membranes or skin necrosis: no Has patient had a PCN reaction that required hospitalization no - childhood reaction Has patient had a PCN reaction occurring within the last 10 years: no - childhood reaction If all of the above answers are "NO", then may proceed with Cephalosporin use.    Sulfonamide Derivatives Hives   Sulfa Antibiotics Hives   Advil [Ibuprofen] Swelling and Other (See Comments)    Lip swelling   Atorvastatin Other (See Comments)    Muscle pain    Outstanding Labs/Studies   N/A  Duration of Discharge Encounter   Greater than 30 minutes including physician time.  Con Memos, PA-C 09/05/2023, 12:13 PM

## 2023-09-05 NOTE — Interval H&P Note (Signed)
History and Physical Interval Note:  09/05/2023 6:56 AM  [name]  has presented today for surgery, with the diagnosis of chest pain.  The various methods of treatment have been discussed with the patient and family. After consideration of risks, benefits and other options for treatment, the patient has consented to  Procedure(s): LEFT HEART CATH AND CORONARY ANGIOGRAPHY (N/A) as a surgical intervention.  The patient's history has been reviewed, patient examined, no change in status, stable for surgery.  I have reviewed the patient's chart and labs.  Questions were answered to the patient's satisfaction.   Cath Lab Visit (complete for each Cath Lab visit)  Clinical Evaluation Leading to the Procedure:   ACS: No.  Non-ACS:    Anginal Classification: CCS II  Anti-ischemic medical therapy: Maximal Therapy (2 or more classes of medications)  Non-Invasive Test Results: No non-invasive testing performed  Prior CABG: No previous CABG        Theron Arista [facility] 09/05/2023 6:56 AM

## 2023-09-05 NOTE — Progress Notes (Signed)
Pt was educated on stent card, stent location, Antiplatelet and ASA use, wt restrictions, no baths/daily wash-ups, s/s of infection, ex guidelines, s/s to stop exercising, NTG use and calling 911, heart healthy diet, risk factors and CRPII. Pt received materials on exercise, diet, and CRPII. Will refer to [facility].   Faustino Congress MS, ACSM-CEP 09/05/2023 11:19 AM

## 2023-09-05 NOTE — Progress Notes (Signed)
TR band removed at 1100, gauze dressing applied. Right radial level 0, clean, dry, and intact.

## 2023-09-05 NOTE — Discharge Instructions (Signed)

## 2023-09-09 ENCOUNTER — Other Ambulatory Visit (HOSPITAL_COMMUNITY): Payer: Self-pay

## 2023-09-09 ENCOUNTER — Telehealth (HOSPITAL_COMMUNITY): Payer: Self-pay

## 2023-09-09 NOTE — Telephone Encounter (Signed)
Called and spoke with pt in regards to CR, pt stated he is not interested at this time.   Closed referral 

## 2023-09-10 NOTE — Progress Notes (Signed)
Cardiology Office Note:  .   Date:  09/12/2023  ID:  DONTERRIOUS REDUS, DOB 1946/07/16, MRN [id number] PCP: Lula Olszewski, MD  [facility] Providers Cardiologist:  Nanetta Batty, MD }   History of Present Illness: Allen [name]   Allen [name] is a 77 y.o. male we are seeing post hospitalization after admission for cardiac cath on 09/05/2023 revealing 2 vessel CAD requiring PCI to the LAD with SYNERGY XD 3.0X12.on DAPT for one month and then Plavix for 6 months. Stop ASA on Oct 05, 2023.  Other hx of HFmrEF, carotid artery disease, left subclavian artery stenosis, PE on Eliquis, hypertension, hyperlipidemia, COPD, OSA   Since leaving the hospital after catheterization he has fallen.  Actually he tripped on some boxes that were in the hallway.  He has some neuropathy and numbness in his hands and feet related to his CT 3 C4 compression.  This causes him to sometimes be unsteady on his feet.  Also with a history of a CVA he does have some weakness in his left leg which he sometimes drags.  After sustaining the fall he landed on his right side injuring the left pectoral area of his chest, and also injuring his left hand and wrist.  He also hit his head and had some scalp bleeding.  He refused evaluation in the ED despite his wife's insistence.  The patient did not drive for 24 hours after the fall.  He now just has significant ecchymosis on the right pectoral area, and has some old ecchymosis left hand and wrist.  He is able to move his hands and wrists without difficulty.  There is no swelling.  He denies recurrent chest pain.  He denies any palpitations or shortness of breath.  He is medically compliant.  He does have some difficulty remembering to take his Repatha which he takes on the 15th and the 30th of each month.  ROS: As above otherwise negative.  Studies Reviewed: Allen [name]   [facility] 09/05/2023 09/05/23 [facility]     Prox Cx lesion is 40% stenosed.   Mid RCA lesion is 100% stenosed.   Prox RCA lesion  is 45% stenosed.   Dist LM lesion is 20% stenosed.   Prox LAD lesion is 60% stenosed.   1st Sept lesion is 90% stenosed.   Non-stenotic Mid Cx lesion was previously treated.   A drug-eluting stent was successfully placed using a SYNERGY XD 3.0X12.   Post intervention, there is a 0% residual stenosis.   There is moderate left ventricular systolic dysfunction.   LV end diastolic pressure is moderately elevated.   The left ventricular ejection fraction is 35-45% by visual estimate.   Recommend to resume Apixaban, at currently prescribed dose and frequency on 09/05/2023.   Recommend concurrent antiplatelet therapy of Aspirin 81 mg for 1 month and Clopidogrel 75mg  daily for 6 months .   2 vessel obstructive CAD. The mid LCx stent is widely patent. The mid RCA has CTO with grade 3 left to right collaterals from the LAD. There was a borderline lesion in the proximal LAD but RFR was abnormal at 0.79. He also has severe stenosis in the first septal perforator.  Moderate LV dysfunction. EF estimated at 40-45% with inferior HK.  Moderately elevated LVEDP 27 mm Hg Successful PCI of the proximal LAD with DES   EKG Interpretation Date/Time:  Friday September 12 2023 14:36:46 EST Ventricular Rate:  59 PR Interval:  188 QRS Duration:  120 QT Interval:  438  QTC Calculation: 433 R Axis:   43  Text Interpretation: Sinus bradycardia Cannot rule out Anterior infarct , age undetermined When compared with ECG of 05-Sep-2023 08:47, No significant change was found Confirmed by Joni Reining [phone]) on 09/12/2023 3:56:25 PM    Physical Exam:   VS:  BP 122/66 (BP Location: Left Arm, Patient Position: Sitting, Cuff Size: Large)   Pulse (!) 59   Ht 6' (1.829 m)   Wt 246 lb 6.4 oz (111.8 kg)   SpO2 99%   BMI 33.42 kg/m    Wt Readings from Last 3 Encounters:  09/12/23 246 lb 6.4 oz (111.8 kg)  09/05/23 248 lb (112.5 kg)  09/01/23 250 lb (113.4 kg)    GEN: Well nourished, well developed in no acute  distress NECK: No JVD; No carotid bruits CARDIAC: RRR, bradycardic, no murmurs, rubs, gallops RESPIRATORY:  Clear to auscultation without rales, wheezing or rhonchi  ABDOMEN: Soft, non-tender, non-distended EXTREMITIES:  No edema; No deformity ecchymosis to the left hand and wrist.  Catheter insertion site is well-healed. Musculoskeletal: Significant ecchymosis to the right pectoral area of his chest.  He does have a small bandage to the top of his head.  ASSESSMENT AND PLAN: .    Coronary artery disease: He is status post cardiac catheterization as described above.  He denies any symptoms.  He is currently on aspirin, Plavix, and Eliquis; he will stop taking aspirin on October 06, 2023.  I have reviewed his cardiac catheterization report with him and given him a copy to include the illustrations with labeling.  They are grateful and verbalized understanding.  He will follow-up with Dr. Allyson Sabal in 3 months.  He has refused cardiac rehab.  2.  Carotid artery disease: No significant carotid bruit is noted.  Continue secondary prevention along with cardiovascular prevention with weight control, lipid management, blood pressure control, purposeful exercise.  3.  Hypertension: Blood pressure is well-controlled currently.  He is on isosorbide mononitrate 60 mg daily in addition to metoprolol and olmesartan HCTZ.  May need to consider decreasing isosorbide as he is now having good blood flow in the left anterior descending status post stenting.  However, he did have to take 1 nitroglycerin due to breathing issue once since leaving the hospital.  Will maintain him on current regimen when he follows up with Dr. Allyson Sabal in 3 months they may decide to decrease the dose.  Recommend follow-up BMET.  This is ordered in advance of next office visit  4.  Hypercholesterolemia: Goal of LDL less than 70.  Most recent labs in March 2024 total cholesterol 147, HDL 38, LDL 89.  He is advised to continue to take Repatha as  directed.  He admits to sometimes forgetting to take the medications.  I have reminded him with his coronary artery disease we do not want to have any progression especially with multiple stents.  He verbalizes understanding.  He normally takes it on the 15th and the 30th of each month.  He will need to have fasting lipid just prior to next office visit.  5.  History of PE: Remains on Eliquis.  As stated above he will stop his aspirin on October 06, 2023.  6.  PAD: History of left subclavian artery stenosis.  Medical management right now.         Signed, Bettey Mare. Liborio Nixon, ANP, AACC

## 2023-09-11 ENCOUNTER — Other Ambulatory Visit: Payer: Self-pay | Admitting: Cardiovascular Disease

## 2023-09-12 ENCOUNTER — Ambulatory Visit: Payer: Medicare Other | Attending: Adult Health | Admitting: Adult Health

## 2023-09-12 ENCOUNTER — Encounter: Payer: Self-pay | Admitting: Adult Health

## 2023-09-12 VITALS — BP 122/66 | HR 59 | Ht 72.0 in | Wt 246.4 lb

## 2023-09-12 DIAGNOSIS — I10 Essential (primary) hypertension: Secondary | ICD-10-CM | POA: Diagnosis not present

## 2023-09-12 DIAGNOSIS — I2782 Chronic pulmonary embolism: Secondary | ICD-10-CM

## 2023-09-12 DIAGNOSIS — I639 Cerebral infarction, unspecified: Secondary | ICD-10-CM | POA: Diagnosis not present

## 2023-09-12 DIAGNOSIS — I25118 Atherosclerotic heart disease of native coronary artery with other forms of angina pectoris: Secondary | ICD-10-CM

## 2023-09-12 DIAGNOSIS — I739 Peripheral vascular disease, unspecified: Secondary | ICD-10-CM | POA: Diagnosis not present

## 2023-09-12 DIAGNOSIS — E7800 Pure hypercholesterolemia, unspecified: Secondary | ICD-10-CM

## 2023-09-12 NOTE — Patient Instructions (Signed)
Medication Instructions:  Stop Aspirin on December 16 th 2024. *If you need a refill on your cardiac medications before your next appointment, please call your pharmacy*   Lab Work: CMET, Lipid Panel 1-2 weeks Prior to Follow Up Appointment. If you have labs (blood work) drawn today and your tests are completely normal, you will receive your results only by: MyChart Message (if you have MyChart) OR A paper copy in the mail If you have any lab test that is abnormal or we need to change your treatment, we will call you to review the results.   Testing/Procedures: No Testing   Follow-Up: At [facility], you and your health needs are our priority.  As part of our continuing mission to provide you with exceptional heart care, we have created designated Provider Care Teams.  These Care Teams include your primary Cardiologist (physician) and Advanced Practice Providers (APPs -  Physician Assistants and Nurse Practitioners) who all work together to provide you with the care you need, when you need it.  We recommend signing up for the patient portal called "MyChart".  Sign up information is provided on this After Visit Summary.  MyChart is used to connect with patients for Virtual Visits (Telemedicine).  Patients are able to view lab/test results, encounter notes, upcoming appointments, etc.  Non-urgent messages can be sent to your provider as well.   To learn more about what you can do with MyChart, go to [email or web address].    Your next appointment:   3-42month(s)  Provider:   Nanetta Batty, MD

## 2023-09-29 ENCOUNTER — Encounter: Payer: Medicare Other | Admitting: Pharmacist

## 2023-09-29 ENCOUNTER — Other Ambulatory Visit (HOSPITAL_COMMUNITY): Payer: Self-pay

## 2023-10-07 ENCOUNTER — Ambulatory Visit: Payer: Medicare Other | Admitting: Adult Health

## 2023-10-25 ENCOUNTER — Other Ambulatory Visit (HOSPITAL_COMMUNITY): Payer: Self-pay

## 2023-11-11 ENCOUNTER — Other Ambulatory Visit (HOSPITAL_COMMUNITY): Payer: Self-pay

## 2023-11-19 ENCOUNTER — Other Ambulatory Visit (HOSPITAL_COMMUNITY): Payer: Self-pay

## 2023-11-22 ENCOUNTER — Other Ambulatory Visit: Payer: Self-pay | Admitting: General Practice

## 2023-12-01 ENCOUNTER — Ambulatory Visit: Payer: Medicare Other | Admitting: Cardiovascular Disease

## 2023-12-02 ENCOUNTER — Encounter: Payer: Self-pay | Admitting: Adult Health

## 2023-12-02 ENCOUNTER — Ambulatory Visit: Payer: Medicare Other | Admitting: Pulmonary Disease

## 2023-12-02 ENCOUNTER — Ambulatory Visit: Payer: Medicare Other | Admitting: Adult Health

## 2023-12-02 VITALS — BP 134/73 | HR 54 | Temp 97.1°F | Ht 72.0 in | Wt 250.0 lb

## 2023-12-02 DIAGNOSIS — J438 Other emphysema: Secondary | ICD-10-CM

## 2023-12-02 DIAGNOSIS — J9611 Chronic respiratory failure with hypoxia: Secondary | ICD-10-CM | POA: Diagnosis not present

## 2023-12-02 DIAGNOSIS — G4733 Obstructive sleep apnea (adult) (pediatric): Secondary | ICD-10-CM

## 2023-12-02 DIAGNOSIS — I2694 Multiple subsegmental pulmonary emboli without acute cor pulmonale: Secondary | ICD-10-CM

## 2023-12-02 DIAGNOSIS — I2510 Atherosclerotic heart disease of native coronary artery without angina pectoris: Secondary | ICD-10-CM

## 2023-12-02 DIAGNOSIS — I2583 Coronary atherosclerosis due to lipid rich plaque: Secondary | ICD-10-CM

## 2023-12-02 LAB — PULMONARY FUNCTION TEST
DL/VA % pred: 98 %
DL/VA: 3.86 ml/min/mmHg/L
DLCO cor % pred: 81 %
DLCO cor: 21.75 ml/min/mmHg
DLCO unc % pred: 81 %
DLCO unc: 21.75 ml/min/mmHg
FEF 25-75 Pre: 1.14 L/s
FEF2575-%Pred-Pre: 47 %
FEV1-%Pred-Pre: 65 %
FEV1-Pre: 2.17 L
FEV1FVC-%Pred-Pre: 89 %
FEV6-%Pred-Pre: 76 %
FEV6-Pre: 3.3 L
FEV6FVC-%Pred-Pre: 104 %
FVC-%Pred-Pre: 73 %
FVC-Pre: 3.35 L
Pre FEV1/FVC ratio: 65 %
Pre FEV6/FVC Ratio: 98 %

## 2023-12-02 MED ORDER — SPIRIVA RESPIMAT 1.25 MCG/ACT IN AERS: 2.0000 | INHALATION_SPRAY | Freq: Every day | RESPIRATORY_TRACT | Status: DC

## 2023-12-02 MED ORDER — SPIRIVA RESPIMAT 2.5 MCG/ACT IN AERS: 2.0000 | INHALATION_SPRAY | Freq: Every day | RESPIRATORY_TRACT | 5 refills | Status: DC

## 2023-12-02 NOTE — Progress Notes (Signed)
Spirometry/DLCO performed today.

## 2023-12-02 NOTE — Patient Instructions (Signed)
Spirometry/diffusion capacity performed today.

## 2023-12-02 NOTE — Patient Instructions (Addendum)
Continue on Symbicort 2 puffs Twice daily  , rinse after use . Begin Spiriva 2 puffs daily   Albuterol inhaler As needed   Activity as tolerated.    Continue on Eliquis 5mg  Twice daily  Avoid NSAIDS- no advil, motrin or aleve.   Do not drive if sleepy .  Work on healthy weight loss.   Follow up with Dr. Wynona Neat or Ilah Boule NP in 6 months and As needed
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
## 2023-12-03 ENCOUNTER — Other Ambulatory Visit (HOSPITAL_COMMUNITY): Payer: Self-pay

## 2023-12-03 NOTE — Assessment & Plan Note (Signed)
History of PE in August 2022 - likely provoked (extensive car trip with risk factors -sedentary lifestyle, obesity, LE edema.) continue on lifelong therapy . Continue on Eliquis could consider low dose going forward .

## 2023-12-03 NOTE — Assessment & Plan Note (Signed)
S/p PCI with DES 2024 . Continue on current regimen and follow up with cards

## 2023-12-03 NOTE — Assessment & Plan Note (Signed)
Continue on Symbicort . Add Spiriva for improved symptom control   Plan  Patient Instructions  Continue on Symbicort 2 puffs Twice daily  , rinse after use . Begin Spiriva 2 puffs daily   Albuterol inhaler As needed   Activity as tolerated.    Continue on Eliquis 5mg  Twice daily  Avoid NSAIDS- no advil, motrin or aleve.   Do not drive if sleepy .  Work on healthy weight loss.   Follow up with Dr. Wynona Neat or Salaya Holtrop NP in 6 months and As needed

## 2023-12-03 NOTE — Assessment & Plan Note (Signed)
BIPAP  intolerant

## 2023-12-04 NOTE — Telephone Encounter (Signed)
Copied from CRM [id number]. Topic: Clinical - Prescription Issue >> Dec 03, 2023  1:04 PM Isabell A wrote: Reason for CRM: Patient states Reginal Lutes is to expensive - costing $300+ monthly, requesting alternative Greggory Keen which will cost him $15 a month.    Left a vm and also sent a my chart message advising patient to check with his insurance company to see if they will cover Zepbound or a similar weight loss medication. As Greggory Keen is for diabetes (which he doesn't have). Then let us know and that per Dr. Jon Billings, he may want an appt to discuss medication changes first. Donzetta Starch, CMA

## 2023-12-05 ENCOUNTER — Other Ambulatory Visit (HOSPITAL_BASED_OUTPATIENT_CLINIC_OR_DEPARTMENT_OTHER): Payer: Self-pay

## 2023-12-05 ENCOUNTER — Other Ambulatory Visit: Payer: Self-pay

## 2023-12-05 DIAGNOSIS — G4733 Obstructive sleep apnea (adult) (pediatric): Secondary | ICD-10-CM

## 2023-12-05 MED ORDER — TIRZEPATIDE-WEIGHT MANAGEMENT 5 MG/0.5ML ~~LOC~~ SOAJ
5.0000 mg | SUBCUTANEOUS | 1 refills | Status: DC
  Filled 2023-12-05: qty 2, 28d supply, fill #0
  Filled 2024-01-05 – 2024-01-22 (×8): qty 2, 28d supply, fill #1
  Filled 2024-02-18 – 2024-03-05 (×2): qty 2, 28d supply, fill #2

## 2023-12-05 NOTE — Telephone Encounter (Signed)
Copied from CRM [id number]. Topic: Clinical - Medication Question >> Dec 04, 2023 12:38 PM Corin V wrote: Reason for CRM: Tasia Catchings with patient insurance called to let Dr. Jon Billings know that he was working with patient on getting an alternate medication for the [facility]. Since he is using it for weight loss Ozempic would be the best alternative. This will need a prior auth if prescribed.    Sent in Zepbound 5mg  for patient per Dr. Jon Billings. Also, requested that the PA team complete a PA for this medication. Donzetta Starch, CMA

## 2023-12-06 ENCOUNTER — Other Ambulatory Visit (HOSPITAL_BASED_OUTPATIENT_CLINIC_OR_DEPARTMENT_OTHER): Payer: Self-pay

## 2023-12-07 ENCOUNTER — Other Ambulatory Visit (HOSPITAL_BASED_OUTPATIENT_CLINIC_OR_DEPARTMENT_OTHER): Payer: Self-pay

## 2023-12-08 NOTE — Telephone Encounter (Signed)
Copied from CRM [id number]. Topic: General - Other >> Dec 04, 2023 12:07 PM Prudencio Pair wrote: Reason for CRM: Patient returning CMA, Kalyse Meharg's call. Called CAL & was told she is gone to lunch & that she left a message in MyChart to patient. 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 I read the message to him that was left in MyChart & pt states he will give insurance company a call with this information & will call back. He also stated the insurance company is the one who suggested the [place].    Spoke with patient previously concerning this. He has already informed me that his insurance covers Zepbound with a PA. Zepbound was sent in and PA request was sent on 2/14. Patient aware. Donzetta Starch, CMA

## 2023-12-08 NOTE — Telephone Encounter (Signed)
Copied from CRM [id number]. Topic: Clinical - Prescription Issue >> Dec 04, 2023 12:35 PM Denese Killings wrote: Reason for CRM: Patient spoke with insurance company and they authorized patient for [employer]. Patient is needing Prior Authorization for the medication. Patient is requesting a call from Jabin Tapp.   Sent in Zepbound 5mg  for patient on 2/14 per Dr. Jon Billings. Requested that the PA team complete a PA for this medication and informed patient on that same day.  Also, that Ozempic is for diabetes (which he doesn't have) and Zepbound is appropriate is for weight loss.  Donzetta Starch, CMA

## 2023-12-09 ENCOUNTER — Other Ambulatory Visit (HOSPITAL_BASED_OUTPATIENT_CLINIC_OR_DEPARTMENT_OTHER): Payer: Self-pay

## 2023-12-10 ENCOUNTER — Other Ambulatory Visit (HOSPITAL_BASED_OUTPATIENT_CLINIC_OR_DEPARTMENT_OTHER): Payer: Self-pay

## 2023-12-10 ENCOUNTER — Telehealth (HOSPITAL_COMMUNITY): Payer: Self-pay | Admitting: Pharmacy Technician

## 2023-12-10 ENCOUNTER — Other Ambulatory Visit (HOSPITAL_COMMUNITY): Payer: Self-pay

## 2023-12-10 NOTE — Telephone Encounter (Signed)
Pharmacy Patient Advocate Encounter   Received notification from Fax that prior authorization for Zepbound 5MG /0.5ML pen-injectors is required/requested.   Insurance verification completed.   The patient is insured through [facility] .   Per test claim: PA required; PA submitted to above mentioned insurance via CoverMyMeds Key/confirmation #/EOC [id number] E Status is pending

## 2023-12-11 ENCOUNTER — Other Ambulatory Visit (HOSPITAL_BASED_OUTPATIENT_CLINIC_OR_DEPARTMENT_OTHER): Payer: Self-pay

## 2023-12-12 ENCOUNTER — Other Ambulatory Visit (HOSPITAL_COMMUNITY): Payer: Self-pay

## 2023-12-12 NOTE — Telephone Encounter (Signed)
Pharmacy Patient Advocate Encounter  Received notification from [facility] that Prior Authorization for Zepbound 5MG /0.5ML pen-injectors is required/requested.  has been APPROVED from 12/10/2023 to 12/09/2024. Unable to obtain price due to refill too soon rejection, last fill date 12/10/2023 next available fill date03/09/2024   PA #/Case ID/Reference #: [id number]

## 2023-12-15 ENCOUNTER — Other Ambulatory Visit (HOSPITAL_BASED_OUTPATIENT_CLINIC_OR_DEPARTMENT_OTHER): Payer: Self-pay

## 2023-12-25 ENCOUNTER — Encounter: Payer: Self-pay | Admitting: Cardiovascular Disease

## 2023-12-25 ENCOUNTER — Ambulatory Visit: Payer: Medicare Other | Attending: Cardiovascular Disease | Admitting: Cardiovascular Disease

## 2023-12-25 VITALS — BP 118/64 | HR 56 | Ht 72.0 in | Wt 250.0 lb

## 2023-12-25 DIAGNOSIS — I10 Essential (primary) hypertension: Secondary | ICD-10-CM | POA: Diagnosis not present

## 2023-12-25 DIAGNOSIS — I252 Old myocardial infarction: Secondary | ICD-10-CM | POA: Diagnosis not present

## 2023-12-25 DIAGNOSIS — I5020 Unspecified systolic (congestive) heart failure: Secondary | ICD-10-CM | POA: Diagnosis not present

## 2023-12-25 DIAGNOSIS — E785 Hyperlipidemia, unspecified: Secondary | ICD-10-CM

## 2023-12-25 MED ORDER — RANOLAZINE ER 500 MG PO TB12: 500.0000 mg | ORAL_TABLET | Freq: Two times a day (BID) | ORAL | 3 refills | Status: AC

## 2023-12-25 NOTE — Assessment & Plan Note (Signed)
 History of dyslipidemia on Repatha and Crestor with lipid profile performed 12/31/2022 revealing total cholesterol 147, LDL 112 and HDL of 38.

## 2023-12-25 NOTE — Assessment & Plan Note (Signed)
 History of essential hypertension blood pressure measured today at 118/64.  He is on Benicar/hydrochlorothiazide and metoprolol.

## 2023-12-25 NOTE — Assessment & Plan Note (Signed)
 History of CAD status post catheterization by Dr. Okey Dupre 12/26/2019 revealing minimal disease in the LAD, 80% stenosis in the AV groove circumflex which was stented and a chronically occluded RCA with left-to-right collaterals with preserved EF.  Because of progressive nitroglycerin responsive chest pain I arrange for him to undergo outpatient cardiac catheterization by Dr. [place] 09/05/2023 revealing an occluded RCA with left-to-right collaterals, patent AV groove circumflex stent and significant proximal LAD stenosis.  Dr. Swaz[place]and stented him with a 3 mm x 12 mm long Synergy drug-eluting stent postdilated with a 3.5 mm balloon.  His symptoms improved after that although he still has effort angina requiring nitroglycerin on maximum dose of isosorbide mononitrate (90 mg).  I am going to begin him on ranolazine 500 mg p.o. twice daily.  If this does not improve his effort angina I will refer him back to Dr. Swaziland for consideration of RCA CTO intervention.

## 2023-12-25 NOTE — Assessment & Plan Note (Signed)
 EF by left-ventricular graffiti at the time of cath in November was normal.

## 2023-12-25 NOTE — Progress Notes (Signed)
 12/25/2023 Allen [name]   August 18, 1946  [id number]  Primary Physician Lula Olszewski, MD Primary Cardiologist: Runell Gess MD Nicholes Calamity, [place]  HPI:  Allen [name] is a 78 y.o.   mildly overweight married Caucasian male father of 6 (4 biologic, 2 adopted), grandfather of 6 grandchildren who  was referred by Dr. Beverely Low for cardiovascular evaluation because of chest pain.  He is retired Emergency planning/management officer in [place].  I last saw him in the office 09/01/2023.  He is accompanied by his wife [name] today...  He has had cardiac catheterizations in the past, one remotely and 1 performed by Dr. Excell Seltzer 04/26/14 revealing noncritical CAD with normal LV function. His pain at that time was thought to be noncardiac. His problems include remote tobacco abuse having smoked 35-50 pack years and stopped 20 years ago. He also has a History of hypertension and hyperlipidemia. He has never had a heart attack or stroke. He's had chest pain which began a year ago that occurs monthly. He experienced 3 episodes over Christmas which were more worrisome.   I performed Myoview stress testing on him 12/05/2016 that showed mild ischemia in the RCA territory.  Apparently at his previous cath in 2015 he did have moderate RCA disease.  I elected to treat him medically at that time.  He was admitted 12/26/2019 with a non-STEMI and underwent cardiac catheterization the following day by Dr. Okey Dupre revealing minimal disease in the LAD, 80% mid AV groove circumflex with which was stented with a 2.5 mm x 18 mm long Medtronic resolute Onyx drug-eluting stent.  His RCA was occluded with left-to-right collaterals and his EF was preserved in the 50% range with inferior hypokinesia.  Since I saw him in the office 3 months ago he was complaining of increasing effort angina.  I referred him for outpatient cardiac cath 4 days later by Dr. Swazil[place] on 09/05/2023 which revealed normal LV systolic function, and occluded RCA which was  chronic with left-to-right collaterals, patent AV groove circumflex stent and significant proximal LAD disease which was stented using a 3 mm x 12 mm long Synergy drug-eluting stent.  This significantly improved his symptoms although he still has effort angina requiring sublingual nitroglycerin albeit improved.  Current Meds  Medication Sig   albuterol (VENTOLIN HFA) 108 (90 Base) MCG/ACT inhaler INHALE 2 PUFFS BY MOUTH EVERY 6 HOURS AS NEEDED FOR WHEEZING OR SHORTNESS OF BREATH   allopurinol (ZYLOPRIM) 100 MG tablet Take 1 tablet (100 mg total) by mouth daily. (Patient taking differently: Take 100 mg by mouth daily as needed (gout).)   ciclopirox (LOPROX) 0.77 % cream Apply topically 2 (two) times daily. (Patient taking differently: Apply 1 Application topically 2 (two) times daily as needed (foot fungus/irritation.).)   clopidogrel (PLAVIX) 75 MG tablet Take 1 tablet (75 mg total) by mouth daily with breakfast.   colchicine 0.6 MG tablet Take 1 tablet (0.6 mg total) by mouth daily. Until flare resolves. Do not take rosuvastatin or aspirin when taking this medication. (Patient taking differently: Take 0.6 mg by mouth daily as needed (gout flares.). Until flare resolves. Do not take rosuvastatin or aspirin when taking this medication.)   cyanocobalamin (VITAMIN B12) 1000 MCG tablet Take 1 tablet (1,000 mcg total) by mouth daily. (Patient taking differently: Take 1,000 mcg by mouth daily after lunch.)   ELIQUIS 5 MG TABS tablet TAKE 1 TABLET BY MOUTH TWICE A DAY   Evolocumab (REPATHA SURECLICK) 140 MG/ML SOAJ INJECT 1  ML INTO THE SKIN EVERY 14 (FOURTEEN) DAYS.   FLUoxetine (PROZAC) 20 MG tablet Take 1 tablet (20 mg total) by mouth daily. To start: just half tablet daily for 2 weeks (Patient taking differently: Take 20 mg by mouth daily after lunch. To start: just half tablet daily for 2 weeks)   furosemide (LASIX) 20 MG tablet Take 2 tablets (40 mg total) by mouth daily.   isosorbide mononitrate (IMDUR)  30 MG 24 hr tablet TAKE 1/2 OF A TABLET (15 MG TOTAL) BY MOUTH DAILY   isosorbide mononitrate (IMDUR) 60 MG 24 hr tablet Take 1 tablet (60 mg total) by mouth daily.   metFORMIN (GLUCOPHAGE) 500 MG tablet Take 1 tablet (500 mg total) by mouth 2 (two) times daily with a meal.   metoprolol succinate (TOPROL-XL) 50 MG 24 hr tablet TAKE 1 TABLET BY MOUTH EVERY DAY WITH OR IMMEDIATELY FOLLOWING A MEAL   nitroGLYCERIN (NITROSTAT) 0.4 MG SL tablet PLACE 1 TABLET UNDER THE TONGUE EVERY 5 MINUTES AS NEEDED FOR CHEST PAIN. MAX 3, CALL 911   olmesartan-hydrochlorothiazide (BENICAR HCT) 20-12.5 MG tablet Take 1 tablet by mouth daily.   Olopatadine HCl (PATADAY) 0.2 % SOLN Apply 1 drop to each eye once daily. (Patient taking differently: Place 1 drop into both eyes daily as needed (eye irritation.).)   pantoprazole (PROTONIX) 40 MG tablet Take 1 tablet (40 mg total) by mouth daily.   ranolazine (RANEXA) 500 MG 12 hr tablet Take 1 tablet (500 mg total) by mouth 2 (two) times daily.   rosuvastatin (CRESTOR) 20 MG tablet Take 1 tablet (20 mg total) by mouth daily. (Patient taking differently: Take 20 mg by mouth 3 (three) times a week.)   Semaglutide-Weight Management 2.4 MG/0.75ML SOAJ Inject 2.4 mg into the skin once a week.   Tiotropium Bromide Monohydrate (SPIRIVA RESPIMAT) 1.25 MCG/ACT AERS Inhale 2 puffs into the lungs daily.   Tiotropium Bromide Monohydrate (SPIRIVA RESPIMAT) 2.5 MCG/ACT AERS Inhale 2 puffs into the lungs daily.   tirzepatide (ZEPBOUND) 5 MG/0.5ML Pen Inject 5 mg into the skin once a week.     Allergies  Allergen Reactions   Penicillins Hives and Other (See Comments)    Has patient had a PCN reaction causing immediate rash, facial/tongue/throat swelling, SOB or lightheadedness with hypotension: no Has patient had a PCN reaction causing severe rash involving mucus membranes or skin necrosis: no Has patient had a PCN reaction that required hospitalization no - childhood reaction Has  patient had a PCN reaction occurring within the last 10 years: no - childhood reaction If all of the above answers are "NO", then may proceed with Cephalosporin use.    Sulfonamide Derivatives Hives   Sulfa Antibiotics Hives   Advil [Ibuprofen] Swelling and Other (See Comments)    Lip swelling   Atorvastatin Other (See Comments)    Muscle pain    Social History   Socioeconomic History   Marital status: Married    Spouse name: Not on file   Number of children: 6   Years of education: Not on file   Highest education level: Not on file  Occupational History   Occupation: retired  Tobacco Use   Smoking status: Former    Current packs/day: 0.00    Average packs/day: 2.0 packs/day for 35.0 years (70.0 ttl pk-yrs)    Types: Cigarettes    Start date: 10/21/1962    Quit date: 10/21/1997    Years since quitting: 26.1   Smokeless tobacco: Never   Tobacco comments:  2-3 ppd  Substance and Sexual Activity   Alcohol use: No    Alcohol/week: 0.0 standard drinks of alcohol   Drug use: No   Sexual activity: Yes    Birth control/protection: None  Other Topics Concern   Not on file  Social History Narrative   Married, 6 kids (2 adopted).   Retired Emergency planning/management officer.   Former smoker.  No signif alc.     No drugs.      Social Drivers of Corporate investment banker Strain: Low Risk  (03/27/2023)   Overall Financial Resource Strain (CARDIA)    Difficulty of Paying Living Expenses: Not very hard  Food Insecurity: No Food Insecurity (03/27/2023)   Hunger Vital Sign    Worried About Running Out of Food in the Last Year: Never true    Ran Out of Food in the Last Year: Never true  Transportation Needs: No Transportation Needs (01/28/2023)   PRAPARE - Administrator, Civil Service (Medical): No    Lack of Transportation (Non-Medical): No  Physical Activity: Insufficiently Active (01/28/2023)   Exercise Vital Sign    Days of Exercise per Week: 1 day    Minutes of Exercise per Session:  50 min  Stress: No Stress Concern Present (01/28/2023)   [employer] of Occupational Health - Occupational Stress Questionnaire    Feeling of Stress : Not at all  Social Connections: Moderately Integrated (01/28/2023)   Social Connection and Isolation Panel [NHANES]    Frequency of Communication with Friends and Family: More than three times a week    Frequency of Social Gatherings with Friends and Family: More than three times a week    Attends Religious Services: More than 4 times per year    Active Member of [employer] or Organizations: No    Attends Banker Meetings: Never    Marital Status: Married  Catering manager Violence: Not At Risk (01/28/2023)   Humiliation, Afraid, Rape, and Kick questionnaire    Fear of Current or Ex-Partner: No    Emotionally Abused: No    Physically Abused: No    Sexually Abused: No     Review of Systems: General: negative for chills, fever, night sweats or weight changes.  Cardiovascular: negative for chest pain, dyspnea on exertion, edema, orthopnea, palpitations, paroxysmal nocturnal dyspnea or shortness of breath Dermatological: negative for rash Respiratory: negative for cough or wheezing Urologic: negative for hematuria Abdominal: negative for nausea, vomiting, diarrhea, bright red blood per rectum, melena, or hematemesis Neurologic: negative for visual changes, syncope, or dizziness All other systems reviewed and are otherwise negative except as noted above.    Blood pressure 118/64, pulse (!) 56, height 6' (1.829 m), weight 250 lb (113.4 kg), SpO2 93%.  General appearance: alert and no distress Neck: no adenopathy, no carotid bruit, no JVD, supple, symmetrical, trachea midline, and thyroid not enlarged, symmetric, no tenderness/mass/nodules Lungs: clear to auscultation bilaterally Heart: regular rate and rhythm, S1, S2 normal, no murmur, click, rub or gallop Extremities: extremities normal, atraumatic, no cyanosis or edema Pulses:  2+ and symmetric Skin: Skin color, texture, turgor normal. No rashes or lesions Neurologic: Grossly normal  EKG not performed today.      ASSESSMENT AND PLAN:   Dyslipidemia, goal LDL below 70 History of dyslipidemia on Repatha and Crestor with lipid profile performed 12/31/2022 revealing total cholesterol 147, LDL 112 and HDL of 38.  Essential hypertension History of essential hypertension blood pressure measured today at 118/64.  He is  on Benicar/hydrochlorothiazide and metoprolol.  History of non-ST elevation myocardial infarction (NSTEMI) History of CAD status post catheterization by Dr. Okey Dupre 12/26/2019 revealing minimal disease in the LAD, 80% stenosis in the AV groove circumflex which was stented and a chronically occluded RCA with left-to-right collaterals with preserved EF.  Because of progressive nitroglycerin responsive chest pain I arrange for him to undergo outpatient cardiac catheterization by Dr. Swaziland 09/05/2023 revealing an occluded RCA with left-to-right collaterals, patent AV groove circumflex stent and significant proximal LAD stenosis.  Dr. Swaziland stented him with a 3 mm x 12 mm long Synergy drug-eluting stent postdilated with a 3.5 mm balloon.  His symptoms improved after that although he still has effort angina requiring nitroglycerin on maximum dose of isosorbide mononitrate (90 mg).  I am going to begin him on ranolazine 500 mg p.o. twice daily.  If this does not improve his effort angina I will refer him back to Dr. Swaziland for consideration of RCA CTO intervention.  HFrEF (heart failure with reduced ejection fraction) (HCC) EF by left-ventricular graffiti at the time of cath in November was normal.     Runell Gess MD [facility], [facility] 12/25/2023 12:02 PM

## 2023-12-25 NOTE — Patient Instructions (Signed)
 Medication Instructions:  Your physician has recommended you make the following change in your medication:   -Start ranolazine (renexa) 500mg  twice daily.  *If you need a refill on your cardiac medications before your next appointment, please call your pharmacy*   Lab Work: Your physician recommends that you return for lab work in: the next week or 2 for FASTING lipid/liver panel  If you have labs (blood work) drawn today and your tests are completely normal, you will receive your results only by: MyChart Message (if you have MyChart) OR A paper copy in the mail If you have any lab test that is abnormal or we need to change your treatment, we will call you to review the results.   Follow-Up: At [facility], you and your health needs are our priority.  As part of our continuing mission to provide you with exceptional heart care, we have created designated Provider Care Teams.  These Care Teams include your primary Cardiologist (physician) and Advanced Practice Providers (APPs -  Physician Assistants and Nurse Practitioners) who all work together to provide you with the care you need, when you need it.  We recommend signing up for the patient portal called "MyChart".  Sign up information is provided on this After Visit Summary.  MyChart is used to connect with patients for Virtual Visits (Telemedicine).  Patients are able to view lab/test results, encounter notes, upcoming appointments, etc.  Non-urgent messages can be sent to your provider as well.   To learn more about what you can do with MyChart, go to [email or web address].    Your next appointment:   3 month(s)  Provider:   Nanetta Batty, MD     Other Instructions   1st Floor: - Lobby - Registration  - Pharmacy  - Lab - Cafe  2nd Floor: - PV Lab - Diagnostic Testing (echo, CT, nuclear med)  3rd Floor: - Vacant  4th Floor: - TCTS (cardiothoracic surgery) - AFib Clinic - Structural Heart Clinic -  Vascular Surgery  - Vascular Ultrasound  5th Floor: - [facility] Cardiology (general and EP) - Clinical Pharmacy for coumadin, hypertension, lipid, weight-loss medications, and med management appointments    Valet parking services will be available as well.

## 2024-01-01 ENCOUNTER — Ambulatory Visit: Payer: Medicare Other | Admitting: Cardiovascular Disease

## 2024-01-05 ENCOUNTER — Other Ambulatory Visit (HOSPITAL_BASED_OUTPATIENT_CLINIC_OR_DEPARTMENT_OTHER): Payer: Self-pay

## 2024-01-06 ENCOUNTER — Other Ambulatory Visit (HOSPITAL_BASED_OUTPATIENT_CLINIC_OR_DEPARTMENT_OTHER): Payer: Self-pay

## 2024-01-08 ENCOUNTER — Other Ambulatory Visit (HOSPITAL_BASED_OUTPATIENT_CLINIC_OR_DEPARTMENT_OTHER): Payer: Self-pay

## 2024-01-14 ENCOUNTER — Other Ambulatory Visit (HOSPITAL_BASED_OUTPATIENT_CLINIC_OR_DEPARTMENT_OTHER): Payer: Self-pay

## 2024-01-17 ENCOUNTER — Other Ambulatory Visit: Payer: Self-pay | Admitting: Internal Medicine

## 2024-01-21 ENCOUNTER — Other Ambulatory Visit: Payer: Self-pay

## 2024-01-21 ENCOUNTER — Other Ambulatory Visit (HOSPITAL_COMMUNITY): Payer: Self-pay

## 2024-01-21 ENCOUNTER — Other Ambulatory Visit (HOSPITAL_BASED_OUTPATIENT_CLINIC_OR_DEPARTMENT_OTHER): Payer: Self-pay

## 2024-01-22 ENCOUNTER — Other Ambulatory Visit (HOSPITAL_BASED_OUTPATIENT_CLINIC_OR_DEPARTMENT_OTHER): Payer: Self-pay

## 2024-01-22 ENCOUNTER — Other Ambulatory Visit (HOSPITAL_COMMUNITY): Payer: Self-pay

## 2024-01-23 ENCOUNTER — Other Ambulatory Visit (HOSPITAL_BASED_OUTPATIENT_CLINIC_OR_DEPARTMENT_OTHER): Payer: Self-pay

## 2024-01-25 ENCOUNTER — Other Ambulatory Visit: Payer: Self-pay | Admitting: Internal Medicine

## 2024-01-29 DIAGNOSIS — D6869 Other thrombophilia: Secondary | ICD-10-CM | POA: Diagnosis not present

## 2024-01-29 DIAGNOSIS — E669 Obesity, unspecified: Secondary | ICD-10-CM | POA: Diagnosis not present

## 2024-02-03 ENCOUNTER — Ambulatory Visit: Payer: Medicare Other

## 2024-02-03 VITALS — Ht 71.0 in | Wt 238.0 lb

## 2024-02-03 DIAGNOSIS — Z0000 Encounter for general adult medical examination without abnormal findings: Secondary | ICD-10-CM

## 2024-02-03 NOTE — Patient Instructions (Signed)
 Allen [name] , Thank you for taking time to come for your Medicare Wellness Visit. I appreciate your ongoing commitment to your health goals. Please review the following plan we discussed and let me know if I can assist you in the future.   Referrals/Orders/Follow-Ups/Clinician Recommendations: maintain health and activity   This is a list of the screening recommended for you and due dates:  Health Maintenance  Topic Date Due   Medicare Annual Wellness Visit  01/28/2024   Flu Shot  05/21/2024   Pneumonia Vaccine  Completed   HPV Vaccine  Aged Out   Meningitis B Vaccine  Aged Out   DTaP/Tdap/Td vaccine  Discontinued   Colon Cancer Screening  Discontinued   COVID-19 Vaccine  Discontinued   Hepatitis C Screening  Discontinued   Zoster (Shingles) Vaccine  Discontinued    Advanced directives: (In Chart) A copy of your advanced directives are scanned into your chart should your provider ever need it.  Next Medicare Annual Wellness Visit scheduled for next year: Yes
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
## 2024-02-15 ENCOUNTER — Encounter: Payer: Self-pay | Admitting: Internal Medicine

## 2024-02-19 ENCOUNTER — Ambulatory Visit: Admitting: Internal Medicine

## 2024-02-23 ENCOUNTER — Encounter: Payer: Self-pay | Admitting: Internal Medicine

## 2024-02-23 ENCOUNTER — Other Ambulatory Visit: Payer: Self-pay | Admitting: Cardiology

## 2024-02-23 ENCOUNTER — Ambulatory Visit: Admitting: Internal Medicine

## 2024-02-23 ENCOUNTER — Other Ambulatory Visit: Payer: Self-pay | Admitting: Internal Medicine

## 2024-02-23 VITALS — BP 126/66 | HR 70 | Temp 98.0°F | Ht 71.0 in | Wt 238.4 lb

## 2024-02-23 DIAGNOSIS — R454 Irritability and anger: Secondary | ICD-10-CM

## 2024-02-23 DIAGNOSIS — R7989 Other specified abnormal findings of blood chemistry: Secondary | ICD-10-CM

## 2024-02-23 DIAGNOSIS — Z7409 Other reduced mobility: Secondary | ICD-10-CM | POA: Diagnosis not present

## 2024-02-23 NOTE — Patient Instructions (Addendum)
 It was a pleasure seeing you today! Your health and satisfaction are our top priorities.  Scherrie Curt, MD  Your Providers PCP: Anthon Kins, MD,  [phone]) Referring Provider: Anthon Kins, MD,  [phone]) Care Team Provider: Albert Huff, MD,  [phone]) Care Team Provider: Janel Medford, MD Care Team Provider: Marianna Shirk,  [phone]) Care Team Provider: Avanell Leigh, MD,  [phone]) Care Team Provider: Avanell Leigh, MD,  [phone]) Care Team Provider: Margaretann Sharper, MD,  [phone]) Care Team Provider: Drema Genta, NP,  [phone])     NEXT STEPS: [x]  Early Intervention: Schedule sooner appointment, call our on-call services, or go to emergency room if there is any significant Increase in pain or discomfort New or worsening symptoms Sudden or severe changes in your health [x]  Flexible Follow-Up: We recommend a No follow-ups on file. for optimal routine care. This allows for progress monitoring and treatment adjustments. [x]  Preventive Care: Schedule your annual preventive care visit! It's typically covered by insurance and helps identify potential health issues early. [x]  Lab & X-ray Appointments: Incomplete tests scheduled today, or call to schedule. X-rays: [facility] (M-F, 8:30am-noon or 1pm-5pm). [x]  Medical Information Release: Sign a release form at front desk to obtain relevant medical information we don't have.  MAKING THE MOST OF OUR FOCUSED 20 MINUTE APPOINTMENTS: [x]   Clearly state your top concerns at the beginning of the visit to focus our discussion [x]   If you anticipate you will need more time, please inform the front desk during scheduling - we can book multiple appointments in the same week. [x]   If you have transportation problems- use our convenient video appointments or ask about transportation support. [x]   We can get down to business faster if you use MyChart to update  information before the visit and submit non-urgent questions before your visit. Thank you for taking the time to provide details through MyChart.  Let our nurse know and she can import this information into your encounter documents.  Arrival and Wait Times: [x]   Arriving on time ensures that everyone receives prompt attention. [x]   Early morning (8a) and afternoon (1p) appointments tend to have shortest wait times. [x]   Unfortunately, we cannot delay appointments for late arrivals or hold slots during phone calls.  Getting Answers and Following Up [x]   Simple Questions & Concerns: For quick questions or basic follow-up after your visit, reach us  at [phone] or MyChart messaging. [x]   Complex Concerns: If your concern is more complex, scheduling an appointment might be best. Discuss this with the staff to find the most suitable option. [x]   Lab & Imaging Results: We'll contact you directly if results are abnormal or you don't use MyChart. Most normal results will be on MyChart within 2-3 business days, with a review message from Dr. Boston Byers. Haven't heard back in 2 weeks? Need results sooner? Contact us  at [phone]. [x]   Referrals: Our referral coordinator will manage specialist referrals. The specialist's office should contact you within 2 weeks to schedule an appointment. Call us  if you haven't heard from them after 2 weeks.  Staying Connected [x]   MyChart: Activate your MyChart for the fastest way to access results and message us . See the last page of this paperwork for instructions on how to activate.  Bring to Your Next Appointment [x]   Medications: Please bring all your medication bottles to your next appointment to ensure we have an accurate record of your prescriptions. [x]   Health Diaries: If  you're monitoring any health conditions at home, keeping a diary of your readings can be very helpful for discussions at your next appointment.  Billing [x]   X-ray & Lab Orders: These  are billed by separate companies. Contact the invoicing company directly for questions or concerns. [x]   Visit Charges: Discuss any billing inquiries with our administrative services team.  Your Satisfaction Matters [x]   Share Your Experience: We strive for your satisfaction! If you have any complaints, or preferably compliments, please let Dr. Boston Byers know directly or contact our Practice Administrators, Olinda Bertrand or [employer], by asking at the front desk.   Reviewing Your Records [x]   Review this early draft of your clinical encounter notes below and the final encounter summary tomorrow on MyChart after its been completed.  All orders placed so far are visible here: Mobility impaired  Requires scooter for mobility -     Ambulatory referral to Physical Therapy

## 2024-02-23 NOTE — Progress Notes (Signed)
 ==============================  [facility]: [phone]   -- Medical Office Visit --  Patient: Allen [name]      Age: 78 y.o.       Sex:  male  Date:   02/23/2024 Today's Healthcare Provider: Anthon Kins, MD  ==============================   Encounter type: Mobility evaluation for power wheelchair / PMD Chief Concern "Can only walk ~1 block with a cane before severe chest pain and shortness of breath; wants electric mobility device to cook, bathe, and get out of the house."  History of Present Illness 78 year old male with severe coronary artery disease and arthritis who presents with mobility issues and a need for an electric mobility device.  He experiences significant mobility issues due to severe coronary artery disease and arthritis, particularly in the knees, described as 'bone on bone'. He has had numerous heart problems, including a recent catheterization for a 60% blockage in the left anterior descending artery and another artery that is completely blocked and cannot be fixed. He also has a history of blood clots in his lungs. These conditions have significantly impacted his mobility, leaving him unable to walk long distances without experiencing severe shortness of breath and chest pain.  He uses nitroglycerin  regularly, taking one or two tablets as needed, but never more than two at a time. He is currently waiting for a prescription refill. He is unstable on his feet, having fallen several times, and often requires assistance to get up from a chair. He uses a cane but can only walk short distances before needing to stop due to shortness of breath and pain.  He has tried using a manual wheelchair but finds it difficult due to shortness of breath and lack of stamina. He has a scooter that he purchased at a yard sale, but it is not reliable for long distances or outdoor use. He wants an electric wheelchair to improve his mobility and  quality of life, particularly for outdoor activities and visiting family.  He sleeps in a recliner chair because lying flat makes him feel like he is 'drowning,' possibly due to fluid in his lungs. He uses a shower chair and has handrails installed in his home to assist with mobility. He has experienced numerous falls over the past year and is unable to stand for long periods, such as when cooking.  History of Present Illness 78 year old male with: Major diagnoses (impacting mobility) Key details  Severe CAD / effort angina 09/05/23 cath: proximal LAD 60 % ? DES, RCA CTO 100 %, septal 90 %. EF 35-45 %. Uses NTG 1-2 tabs for chest pain several times/wk.  HFrEF & chronic orthopnea Sleeps in recliner; orthopnea worsened over past year. Resting SOB; no O? coverage.  COPD-emphysema FEV? 56 % (2024 PFT). Cold-induced asthma.  Severe bilateral knee OA "Bone-on-bone," wears hinged braces; not surgical candidate.  Morbid obesity (BMI 34) Lost 25 lb on GLP-1 but still deconditioned.  Recurrent falls "Too many to count" in past 12 months; needs assist to rise from chair.  Other PE (2022) on lifelong apixaban ; depression/anxiety; mild DDD.  Mobility attempts & failures Cane: in daily use; must rest after ~250 ft due to SOB/angina. Walker: trialed; worsens SOB because trunk flexion increases dyspnea. Manual wheelchair: tried for PT; cannot self-propel d/t fatigue and upper-ext pain. Yard-sale scooter: unreliable battery, cannot handle inclines, unsafe. Instrumental & Basic ADL impact (MRADLs) MRADL Limitation without PMD  Dressing/Grooming Needs wife's help >50 % of time; cannot stand  at closet.  Toileting Can reach bathroom with cane but arrives exhausted; risk of accidents.  Bathing Uses shower chair; walker does not fit through 28-in door.  Meal prep Cannot stand at stove >2 min; relies on microwaved meals.  Household mobility Cannot traverse 40-ft hallway without stopping twice.  Review of Systems  (abbrev.) Cardiovascular: exertional CP, orthopnea, LE edema. Respiratory: resting dyspnea, chronic cough; denies hemoptysis. Musculoskeletal: severe bilateral knee pain, low-back pain, shoulder impingement. Neurologic: no focal deficit; reports imbalance & frequent falls. Psych: anxiety/depression controlled on fluoxetine .  {{ Problem List as of 02/23/2024 Reviewed: 12/25/2023 12:04 PM by Avanell Leigh, MD    Allergic conjunctivitis of both eyes   Last Assessment & Plan 01/20/2023 Office Visit Written 01/20/2023  2:28 PM by , MD  Wife wrote down olopatadine .   Alternative take benadryl at bedtime.      Updated Problem List Entries: No problems updated.  Background Reviewed: Problem List: has Dyslipidemia, goal LDL below 70; Hearing loss; Essential hypertension; Anxiety and depression; De Quervain's tenosynovitis, right; Thyromegaly; Rotator cuff impingement syndrome of left shoulder; COPD (chronic obstructive pulmonary disease) with emphysema (HCC); Gastric mass - submucsaol - antrum; Chronic neck pain; Morbid obesity (HCC); Left shoulder pain; Anxiety state; Subclavian artery stenosis, left (HCC); Nocturia more than twice per night; Lower extremity edema; History of non-ST elevation myocardial infarction (NSTEMI); Status post coronary artery stent placement; Pulmonary embolus (HCC); Pulmonary infarct (HCC); Leukocytosis; Chronic respiratory failure with hypoxia (HCC); Daytime sleepiness; OSA (obstructive sleep apnea); History of pulmonary embolus (PE); Coronary artery disease; Dyslipidemia; Gouty arthropathy; Spondylosis; Depression; Mobility impaired; Bilateral primary osteoarthritis of knee; Circulation disorder of lower extremity; Prediabetes; Onychomycosis; Cold-induced asthma; Allergic conjunctivitis of both eyes; Irritability; Overuse syndrome of low back; Hand numbness; B12 deficiency; Lumbar back pain; Aortic atherosclerosis (HCC); Sleeps in sitting position due to orthopnea; DDD  (degenerative disc disease), thoracic; Carotid atherosclerosis; and HFrEF (heart failure with reduced ejection fraction) (HCC) on their problem list. Past Medical History:  has a past medical history of Arthritis, CAD (coronary artery disease), Cataract, Chest pain (08/08/2011), Chronic left shoulder pain, Chronic neck pain (09/17/2015), Collagenous colitis (2002), COPD (chronic obstructive pulmonary disease) (HCC), Former smoker (quit 1999), Gastric mass - submucsaol - antrum (03/03/2015), GERD (08/21/2007), GERD (gastroesophageal reflux disease), Health maintenance examination (08/08/2011), Hepatic steatosis (04/2014), Hiatal hernia, Hyperlipidemia, Hypertension, Knee sprain (02/17/2013), Obesity, Class II, BMI 35-39.9, OSA (obstructive sleep apnea) (11/29/2021), Prediabetes (01/2015), Prostate cancer screening (08/08/2011), and Spinal cord trauma. Past Surgical History:   has a past surgical history that includes Tonsillectomy; Carpal tunnel release; Cataract extraction w/ intraocular lens implant; Colonoscopy w/ polypectomy (2011); Cataract extraction w/PHACO (Right, 08/31/2013); Yag laser application (Left, 09/14/2013); Cardiac catheterization (04/2014); left heart catheterization with coronary angiogram (N/A, 04/27/2014); Colonoscopy w/ polypectomy; EUS (N/A, 03/09/2015); Esophagogastroduodenoscopy (02/24/15); Yag laser application (Right, 08/13/2016); LEFT HEART CATH AND CORONARY ANGIOGRAPHY (N/A, 12/27/2019); CORONARY STENT INTERVENTION (N/A, 12/27/2019); LEFT HEART CATH AND CORONARY ANGIOGRAPHY (N/A, 09/05/2023); CORONARY STENT INTERVENTION (N/A, 09/05/2023); and CORONARY PRESSURE/FFR STUDY (N/A, 09/05/2023). Social History:   reports that he quit smoking about 26 years ago. His smoking use included cigarettes. He started smoking about 61 years ago. He has a 70 pack-year smoking history. He has never used smokeless tobacco. He reports that he does not drink alcohol and does not use drugs. Family History:  family  history includes Cancer in his mother; Colon polyps in his father; Diabetes in his mother; Heart attack in his father and mother; Heart disease in his brother. Allergies:  is allergic to penicillins, sulfonamide derivatives, sulfa antibiotics, advil [ibuprofen], and atorvastatin .   Medication Reconciliation: Current Outpatient Medications on File Prior to Visit  Medication Sig   albuterol  (VENTOLIN  HFA) 108 (90 Base) MCG/ACT inhaler INHALE 2 PUFFS BY MOUTH EVERY 6 HOURS AS NEEDED FOR WHEEZING OR SHORTNESS OF BREATH   allopurinol  (ZYLOPRIM ) 100 MG tablet Take 1 tablet (100 mg total) by mouth daily. (Patient taking differently: Take 100 mg by mouth daily as needed (gout).)   clopidogrel  (PLAVIX ) 75 MG tablet Take 1 tablet (75 mg total) by mouth daily with breakfast.   colchicine  0.6 MG tablet Take 1 tablet (0.6 mg total) by mouth daily. Until flare resolves. Do not take rosuvastatin  or aspirin  when taking this medication. (Patient taking differently: Take 0.6 mg by mouth daily as needed (gout flares.). Until flare resolves. Do not take rosuvastatin  or aspirin  when taking this medication.)   ELIQUIS  5 MG TABS tablet TAKE 1 TABLET BY MOUTH TWICE A DAY   Evolocumab  (REPATHA  SURECLICK) 140 MG/ML SOAJ INJECT 1 ML INTO THE SKIN EVERY 14 (FOURTEEN) DAYS.   furosemide  (LASIX ) 20 MG tablet Take 2 tablets (40 mg total) by mouth daily.   isosorbide  mononitrate (IMDUR ) 30 MG 24 hr tablet TAKE 1/2 OF A TABLET (15 MG TOTAL) BY MOUTH DAILY   isosorbide  mononitrate (IMDUR ) 60 MG 24 hr tablet Take 1 tablet (60 mg total) by mouth daily.   metoprolol  succinate (TOPROL -XL) 50 MG 24 hr tablet TAKE 1 TABLET BY MOUTH EVERY DAY WITH OR IMMEDIATELY FOLLOWING A MEAL   nitroGLYCERIN  (NITROSTAT ) 0.4 MG SL tablet PLACE 1 TABLET UNDER THE TONGUE EVERY 5 MINUTES AS NEEDED FOR CHEST PAIN. MAX 3, CALL 911   olmesartan -hydrochlorothiazide  (BENICAR  HCT) 20-12.5 MG tablet Take 1 tablet by mouth daily.   pantoprazole  (PROTONIX ) 40 MG  tablet Take 1 tablet (40 mg total) by mouth daily.   ranolazine  (RANEXA ) 500 MG 12 hr tablet Take 1 tablet (500 mg total) by mouth 2 (two) times daily.   rosuvastatin  (CRESTOR ) 20 MG tablet Take 1 tablet (20 mg total) by mouth daily. (Patient taking differently: Take 20 mg by mouth 3 (three) times a week.)   Semaglutide -Weight Management 2.4 MG/0.75ML SOAJ Inject 2.4 mg into the skin once a week.   Tiotropium Bromide Monohydrate  (SPIRIVA  RESPIMAT) 1.25 MCG/ACT AERS Inhale 2 puffs into the lungs daily.   Tiotropium Bromide Monohydrate  (SPIRIVA  RESPIMAT) 2.5 MCG/ACT AERS Inhale 2 puffs into the lungs daily.   tirzepatide  (ZEPBOUND ) 5 MG/0.5ML Pen Inject 5 mg into the skin once a week.   No current facility-administered medications on file prior to visit.  There are no discontinued medications.   Physical Exam:    02/23/2024    9:08 AM 02/03/2024    8:11 AM 12/25/2023   11:17 AM  Vitals with BMI  Height 5\' 11"  5\' 11"  6\' 0"   Weight 238 lbs 6 oz 238 lbs 250 lbs  BMI 33.26 33.21 33.9  Systolic 126  118  Diastolic 66  64  Pulse 70  56  Vital signs reviewed.  Nursing notes reviewed. Weight trend reviewed. Physical Exam General Appearance: chronically ill-appearing male Musculoskeletal: All extremities are intact. Walks with cane Neurological:  Awake, alert, oriented, and engaged.  No obvious focal neurological deficits or cognitive impairments.  Sensorium seems unclouded.   Speech is clear and coherent with logical content. Psychiatric:  Appropriate mood, pleasant and cooperative demeanor, thoughtful and engaged during the exam Physical Exam MUSCULOSKELETAL: Weakness in hand grip, unable to  perform manual tasks such as pushing wheel chair.   SpO2: 98 % on rheumatoid arthritis  Objective / Physical Examination Section Findings  Vitals BP 118/64, HR 74, SpO? 93 % RA at rest; rises to 102 bpm and 88 % after 25-m hallway walk.  General Obese male, uses cane; appears winded after short ambulation.   CV S1/S2, no murmurs; pedal pulses 1+.  Resp Diminished breath sounds bilat.; mild wheeze. Shortness of breath at rest with mild increase WOB noted by provider.  MSK Knees: crepitus, varus deformity, ROM 0-90 with pain- wears braces bilateral. . 5-times-sit-to-stand: unable (<1)- very slow sit to stand requiring cane and arm use, with significant fall risk. Hands are weak but strong enough for power wheelchair but not manual  Neuro Non-focal, but unsteady tandem gait; +Romberg.  Cognition Prior MMSE 30/30 and articulate grossly on exam; understands device use & safety.    Admission on 09/05/2023, Discharged on 09/05/2023  Component Date Value   Glucose-Capillary 09/05/2023 90    Comment 1 09/05/2023 Notify RN    Comment 2 09/05/2023 Document in Chart    Glucose-Capillary 09/05/2023 102 (H)    Activated Clotting Time 09/05/2023 256    Activated Clotting Time 09/05/2023 832    Activated Clotting Time 09/05/2023 245   Office Visit on 09/01/2023  Component Date Value   Glucose 09/01/2023 85    BUN 09/01/2023 14    Creatinine, Ser 09/01/2023 0.83    eGFR 09/01/2023 92    BUN/Creatinine Ratio 09/01/2023 17    Sodium 09/01/2023 141    Potassium 09/01/2023 5.0    Chloride 09/01/2023 100    CO2 09/01/2023 23    Calcium  09/01/2023 9.8    WBC 09/01/2023 7.2    RBC 09/01/2023 5.25    Hemoglobin 09/01/2023 14.6    Hematocrit 09/01/2023 45.2    MCV 09/01/2023 86    MCH 09/01/2023 27.8    MCHC 09/01/2023 32.3    RDW 09/01/2023 14.0    Platelets 09/01/2023 253   Orders Only on 06/02/2023  Component Date Value   FVC-Pre 12/02/2023 3.35    FVC-%Pred-Pre 12/02/2023 73    FEV1-Pre 12/02/2023 2.17    FEV1-%Pred-Pre 12/02/2023 65    FEV6-Pre 12/02/2023 3.30    FEV6-%Pred-Pre 12/02/2023 76    Pre FEV1/FVC ratio 12/02/2023 65    FEV1FVC-%Pred-Pre 12/02/2023 89    Pre FEV6/FVC Ratio 12/02/2023 98    FEV6FVC-%Pred-Pre 12/02/2023 104    FEF 25-75 Pre 12/02/2023 1.14    FEF2575-%Pred-Pre  12/02/2023 47    DLCO unc 12/02/2023 21.75    DLCO unc % pred 12/02/2023 81    DLCO cor 12/02/2023 21.75    DLCO cor % pred 12/02/2023 81    DL/VA 86/57/8469 6.29    DL/VA % pred 52/84/1324 98   No image results found. No results found.      02/03/2024    8:19 AM 01/28/2023    8:07 AM 12/30/2022    2:16 PM 06/20/2021    3:41 PM  PHQ 2/9 Scores  PHQ - 2 Score 0 0 0 0  PHQ- 9 Score  0 8    Results DIAGNOSTIC Cardiac catheterization: 60% stenosis in the left anterior descending artery, one artery completely occluded (09/05/2023)    Assessment & Plan Mobility impaired Assessment Mobility limitation secondary to severe CAD, HFrEF, COPD, bilateral knee OA, morbid obesity, chronic pain ? unable to perform MRADLs with cane/walker/manual W/C. Recurrent falls & orthostatic intolerance ? high injury risk. Home layout: 34-in doorways  except 28-in bathroom door; hallway 40 ft; hardwood floors; adequate turning radius in living areas. Plan / Medical Necessity Statement Power Mobility Device (Group 2 mid-wheel-drive power wheelchair, [employer] [phone]) with captain's seat, swing-away joystick, elevating legrests, and 18-AH batteries is medically necessary for safe, independent performance of MRADLs in the home. Less-complex devices (cane, walker, manual chair, POV/scooter) are unsafe or ineffective (see HPI). Patient can transfer to/from the chair, operate the joystick, and cognitively manage safety features. Will use wheelchair inside home; may use existing scooter for outdoor community distances. Standard Written Order Linde Reveal) completed today (see Section 2). Home assessment to be performed by DME supplier; patient reports 28-in bathroom door--will review possible environmental mods. Prior authorization request to DME MAC ([employer]S) with this note, SWO, cath/echo/PFT reports, PT discharge summary. ([employer] Medicare) Order repeat transthoracic echo and cardiology follow-up. Reinforce fall-prevention, continue PT  HEP, weight management. Requires scooter for mobility Reasons scooter vs wheelchair required:  Severe coronary artery disease   He has severe coronary artery disease with significant mobility limitations due to angina and dyspnea. Recent catheterization showed a 60% stenosis in the left anterior descending artery and a completely occluded artery not amenable to intervention. Angina is intermittently managed with frequent nitroglycerin  use. Dyspnea limits ambulation to less than a block, requiring frequent rest. Insurance may cover an electric scooter for outdoor use, though it is not considered a medical device. Refer to physical therapy for a mobility evaluation for a power mobility device. Complete paperwork for seven factor approval for this device. Consider an Art gallery manager for outdoor mobility and discuss insurance coverage for it or a wheelchair. Order an echocardiogram to assess current cardiac function.  Orthopnea   He experiences orthopnea, making it difficult to lie supine due to a sensation of drowning, necessitating sleeping in a recliner. This symptom is consistent with heart failure and contributes to mobility limitations. Order an echocardiogram to assess cardiac function.  Pulmonary embolism   He has a history of pulmonary embolism with ongoing management. There are no current acute issues, but it contributes to respiratory limitations.  Severe arthritis   Severe arthritis contributes to mobility limitations and pain. His knees are bone on bone, and no surgeon is willing to perform knee arthroplasty due to his overall health status. Refer to physical therapy for a mobility evaluation for a power mobility device.       Orders Placed During this Encounter:       Ambulatory referral to Physical Therapy       Comments: Need quick electric mobility assessment.     DME Wheelchair electric       Comments: Holiday representative (SWO) / "7-Element Order"  *(must be sent to  supplier <= 45 days of F2F) * [employer] Medicare Required Element Entry 1. Beneficiary:  Allen [name], Allen [name]: DOB [birth year]  2. Order date 02/23/2024  3. General description : [id number] power wheelchair - Group 2, mid-wheel, captain seat, joystick, elevating leg-rests, batt pkg (qty 1) 4. Quantity / freq. 1 device (lifetime need >5 yrs) 5. Treating practitioner name/NPI: Scherrie Curt, MD, npi: [id number] 6. Signature:  Scherrie Curt, MD 7. Signature date 02/23/2024    I have personally spent 57 minutes involved in face-to-face and non-face-to-face activities for this patient on the day of the visit. Professional time spent includes the following activities: Preparing to see the patient (review of tests), Obtaining and/or reviewing separately obtained history (admission/discharge record), Performing a medically appropriate examination and/or evaluation , Ordering medications/tests/procedures, referring and communicating with other  health care professionals, Documenting clinical information in the EMR, Independently interpreting results (not separately reported), Communicating results to the patient/family/caregiver, Counseling and educating the patient/family/caregiver and Care coordination (not separately reported).  extensive documentation efforts on behalf of the patient     This document was synthesized by artificial intelligence (Abridge) using HIPAA-compliant recording of the clinical interaction;   We discussed the use of AI scribe software for clinical note transcription with the patient, who gave verbal consent to proceed. additional Info: This encounter employed state-of-the-art, real-time, collaborative documentation. The patient actively reviewed and assisted in updating their electronic medical record on a shared screen, ensuring transparency and facilitating joint problem-solving for the problem list, overview, and plan. This approach promotes accurate, informed care. The treatment plan was discussed  and reviewed in detail, including medication safety, potential side effects, and all patient questions. We confirmed understanding and comfort with the plan. Follow-up instructions were established, including contacting the office for any concerns, returning if symptoms worsen, persist, or new symptoms develop, and precautions for potential emergency department visits.

## 2024-02-23 NOTE — Assessment & Plan Note (Signed)
 Assessment Mobility limitation secondary to severe CAD, HFrEF, COPD, bilateral knee OA, morbid obesity, chronic pain ? unable to perform MRADLs with cane/walker/manual W/C. Recurrent falls & orthostatic intolerance ? high injury risk. Home layout: 34-in doorways except 28-in bathroom door; hallway 40 ft; hardwood floors; adequate turning radius in living areas. Plan / Medical Necessity Statement Power Mobility Device (Group 2 mid-wheel-drive power wheelchair, [employer] [phone]) with captain's seat, swing-away joystick, elevating legrests, and 18-AH batteries is medically necessary for safe, independent performance of MRADLs in the home. Less-complex devices (cane, walker, manual chair, POV/scooter) are unsafe or ineffective (see HPI). Patient can transfer to/from the chair, operate the joystick, and cognitively manage safety features. Will use wheelchair inside home; may use existing scooter for outdoor community distances. Standard Written Order Linde Reveal) completed today (see Section 2). Home assessment to be performed by DME supplier; patient reports 28-in bathroom door--will review possible environmental mods. Prior authorization request to DME MAC ([employer]S) with this note, SWO, cath/echo/PFT reports, PT discharge summary. ([employer] Medicare) Order repeat transthoracic echo and cardiology follow-up. Reinforce fall-prevention, continue PT HEP, weight management.

## 2024-03-01 ENCOUNTER — Other Ambulatory Visit (HOSPITAL_BASED_OUTPATIENT_CLINIC_OR_DEPARTMENT_OTHER): Payer: Self-pay

## 2024-03-01 ENCOUNTER — Other Ambulatory Visit (HOSPITAL_COMMUNITY): Payer: Self-pay

## 2024-03-03 ENCOUNTER — Telehealth: Payer: Self-pay

## 2024-03-03 NOTE — Telephone Encounter (Signed)
 Called pt about referral for electric wheelchair pt states he has not heard anything. What is the next step?

## 2024-03-04 NOTE — Telephone Encounter (Signed)
 read by Dorn Gaskins at 9:16PM on 03/03/2024

## 2024-03-05 ENCOUNTER — Other Ambulatory Visit (HOSPITAL_BASED_OUTPATIENT_CLINIC_OR_DEPARTMENT_OTHER): Payer: Self-pay

## 2024-03-08 ENCOUNTER — Other Ambulatory Visit: Payer: Self-pay | Admitting: Student

## 2024-03-08 DIAGNOSIS — I10 Essential (primary) hypertension: Secondary | ICD-10-CM

## 2024-03-17 ENCOUNTER — Telehealth: Payer: Self-pay

## 2024-03-17 NOTE — Telephone Encounter (Signed)
 Spoke with pt he will call insurance company to see what is covered. He does have app with PT on June 10th at 11am. Pt stated he would let our office know what insurance says and after his PT appt.

## 2024-03-17 NOTE — Telephone Encounter (Signed)
 Copied from CRM [id number]. Topic: Clinical - Prescription Issue >> Mar 17, 2024 12:03 PM [place] J wrote: Patient is calling in to verify that [employer] does cover for an Mining engineer wheelchair.    Spoke with pt and daughter today they have appt on June 11th as well in the mean time they will call the insurance company to see what they would cover at this time. Pt stated he would let us  know once he finds out and after his appt on June 11th 2025.

## 2024-03-30 ENCOUNTER — Encounter: Payer: Self-pay | Admitting: Physical Therapy

## 2024-03-30 ENCOUNTER — Ambulatory Visit: Attending: Internal Medicine | Admitting: Physical Therapy

## 2024-03-30 DIAGNOSIS — Z7409 Other reduced mobility: Secondary | ICD-10-CM | POA: Diagnosis not present

## 2024-03-30 DIAGNOSIS — R2681 Unsteadiness on feet: Secondary | ICD-10-CM | POA: Insufficient documentation

## 2024-03-30 DIAGNOSIS — R2689 Other abnormalities of gait and mobility: Secondary | ICD-10-CM | POA: Insufficient documentation

## 2024-03-30 DIAGNOSIS — M6281 Muscle weakness (generalized): Secondary | ICD-10-CM | POA: Diagnosis not present

## 2024-03-30 NOTE — Therapy (Signed)
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 over active movement [] broken frame on previous chair []                                     [x]  Back  []  Angle Adjustable []  Custom molded    Captain's seat                       [x] postural control [] control of tone/spasticity [] accommodation of range of motion [] UE functional control [x] accommodation for seating system []                                          [] provide lateral trunk support [] accommodate deformity [x] provide posterior trunk support [x] provide lumbar/sacral support [x] support trunk in midline [x] Pressure relief over spinal processes  [x]  Seat Cushion        Captain's seat               [] impaired sensation  [] decubitus ulcers present [] history of pressure ulceration [] prevent pelvic extension [x] low maintenance  [] stabilize pelvis  [] accommodate obliquity [] accommodate multiple  deformity [x] neutralize lower extremity position [x] increase pressure distribution []                                           []  Pelvic/thigh support  []  Lateral thigh guide []  Distal medial pad  []  Distal lateral pad []  pelvis in neutral [] accommodate pelvis []  position upper legs []  alignment []  accommodate ROM []  decrease adduction [] accommodate tone [] removable for transfers [] decrease abduction  []  Lateral trunk Supports []  Lt     []  Rt [] decrease lateral trunk leaning [] control tone [] contour for increased contact [] safety  [] accommodate asymmetry []                                                 []  Mounting hardware  [] lateral trunk supports  [] back   [] seat [] headrest      []  thigh support [] fixed   [] swing away [] attach seat platform/cushion to w/c frame [] attach back cushion to w/c frame [] mount postural supports [] mount headrest  [] swing medial thigh support away [] swing lateral supports away for transfers  []                                                     Armrests  [] fixed [  x]adjustable height [] removable   [] swing away  [x] flip back   [] reclining [x] full length pads [] desk    [] pads tubular  [x] provide support with elbow at 90   [] provide support for w/c tray [x] change of height/angles for variable activities [] remove for transfers [x] allow to come closer to table top [] remove for access to tables []                                               Hangers/ Leg rests  [] 60 [] 70 [] 90 [] elevating [] heavy duty  [] articulating [] fixed [] lift off [] swing away     [] power [] provide LE support  [] accommodate to hamstring tightness [] elevate legs during recline   [] provide change in position for Legs [] Maintain placement of feet on footplate [] durability [] enable transfers [] decrease edema [] Accommodate lower leg length []                                         Foot support Footplate    [] Lt  []  Rt  [x]  Center mount [x] flip up                             [x] depth/angle adjustable [] Amputee adapter    []  Lt     []  Rt [x] provide foot support [x] accommodate to ankle ROM [x] transfers [] Provide support for residual extremity []  allow foot to go under wheelchair base []  decrease tone  []                                                 []  Ankle strap/heel loops [] support foot on foot support [] decrease extraneous movement [] provide input to heel  [] protect foot  Tires: [] pneumatic  [x] flat free inserts  [] solid  [x] decrease maintenance  [x] prevent frequent flats [] increase shock absorbency [] decrease pain from road shock [] decrease spasms from road shock []                                              []  Headrest  [] provide posterior head support [] provide posterior neck support [] provide lateral head support [] provide anterior head support [] support during tilt and recline [] improve feeding   [] improve respiration [] placement of switches [] safety  [] accommodate ROM  [] accommodate tone [] improve visual orientation  []  Anterior chest strap []  Vest []  Shoulder retractors  [] decrease forward movement of shoulder [] accommodation of TLSO [] decrease forward movement of trunk [] decrease shoulder elevation [] added abdominal support [] alignment [] assistance with shoulder control  []                                               Pelvic Positioner [x] Belt [] SubASIS bar [] Dual Pull [] stabilize tone [x] decrease falling out of chair/ **will not Decrease potential for sliding due to pelvic tilting [] prevent excessive rotation [] pad for protection over boney prominence [] prominence comfort [] special pull angle to control rotation []   Upper ExtremitySupport  [] L   []  R [] Arm trough   [] hand support []  tray       [] full tray [] swivel mount [] decrease edema      [] decrease subluxation   [] control tone   [] placement for AAC/Computer/EADL [] decrease gravitational pull on shoulders [] provide  midline positioning [] provide support to increase UE function [] provide hand support in natural position [] provide work surface   POWER WHEELCHAIR CONTROLS  [x] Proportional  [] Non-Proportional Type  Joystick                                    [] Left  [x] Right [x] provides access for controlling wheelchair   [] lacks motor control to operate proportional drive control [] unable to understand proportional controls  Actuator Control Module  [] Single  [] Multiple   [] Allow the client to operate the power seat function(s) through the joystick control   [] Safety Reset Switches [] Used to change modes and stop the wheelchair when driving in latch mode    [] Data processing manager for accurate control [] progressive Disease/changing condition [] non-proportional drive control needed [] Needed in order to operate power seat functions through joystick control   [] Display box [] Allows user to see in which mode and drive the wheelchair is set  [] necessary for alternate controls    [] Digital interface electronics [] Allows w/c to operate when using alternative drive controls  [] ASL Head Array [] Allows client to operate wheelchair  through switches placed in tri-panel headrest  [] Sip and puff with tubing kit [] needed to operate sip and puff drive controls  [] Upgraded tracking electronics [] increase safety when driving [] correct tracking when on uneven surfaces  [x] Mount for switches or joystick [] Attaches switches to w/c  [x] Swing away for access or transfers [] midline for optimal placement [] provides for consistent access  [] Attendant controlled joystick plus mount [] safety [] long distance driving [] operation of seat functions [] compliance with transportation regulations []                                             Rear wheel placement/Axle adjustability [] None [] semi adjustable [] fully adjustable  [] improved UE access to wheels [] improved stability [] changing angle in space for  improvement of postural stability [] 1-arm drive access [] amputee pad placement []                                Wheel rims/ hand rims  [] metal   [] plastic coated [] oblique projections           [] vertical projections [] Provide ability to propel manual wheelchair  []  Increase self-propulsion with hand weakness/decreased grasp  Push handles [] extended   [] angle adjustable              [] standard [] caregiver access [] caregiver assist [] allows "hooking" to enable increased ability to perform ADLs or maintain balance  One armed device   [] Lt   [] Rt [] enable propulsion of manual wheelchair with one arm   []                                            Brake/wheel lock extension []  Lt   []  Rt [] increase indep in applying wheel locks   [] Side guards [] prevent  clothing getting caught in wheel or becoming soiled []  prevent skin tears/abrasions  Battery:    U1 x 2                                       [x] to power wheelchair                                                         Other:                                                                                                                        The above equipment has a life- long use expectancy. Growth and changes in medical and/or functional conditions would be the exceptions. This is to certify that the therapist has no financial relationship with durable medical provider or manufacturer. The therapist will not receive remuneration of any kind for the equipment recommended in this evaluation.   Patient has mobility limitation that significantly impairs safe, timely participation in one or more mobility related ADL's. (bathing, toileting, feeding, dressing, grooming, moving from room to room)  [x]  Yes []  No  Will mobility device sufficiently improve ability to participate and/or be aided in participation of MRADL's?      [x]  Yes []  No  Can limitation be compensated for with use of a cane or walker?                                    []  Yes [x]   No  Does patient or caregiver demonstrate ability/potential ability & willingness to safely use the mobility device?    [x]  Yes []  No  Does patient's home environment support use of recommended mobility device?            [x]  Yes []  No  Does patient have sufficient upper extremity function necessary to functionally propel a manual wheelchair?     []  Yes [x]  No  Does patient have sufficient strength and trunk stability to safely operate a POV (scooter)?                                  []  Yes [x]  No  Does patient need additional features/benefits provided by a power wheelchair for MRADL's in the home?        [x]  Yes []  No  Does the patient demonstrate the ability to safely use a power wheelchair?                   [x]  Yes []  No     Physician's Name Printed:  Scherrie Curt, MD                                               Physician's Signature:  Date:     This is to certify that I, the above signed therapist have the following affiliations: []  This DME provider []  Manufacturer of recommended equipment []  Patient's long term care facility [x]  None of the above  Therapist Name/Signature:  Barb Bonito, PT                                           Date:  03-30-24  ASSESSMENT:  CLINICAL IMPRESSION: Patient is a 78 y.o. gentleman who was seen today for physical therapy evaluation and treatment for mobility impairments due to COPD, h/o cervical spinal trauma, CAD, and h/o PE.  PLAN:  PT FREQUENCY: one time visit  PT DURATION: 1 week  PLANNED INTERVENTIONS: evaluation for power wheelchair.  PLAN FOR NEXT SESSION: N/A - w/c eval only   Dorsie Gaunt, PT 03/30/2024, 1:38 PM

## 2024-04-07 ENCOUNTER — Ambulatory Visit: Attending: Cardiology | Admitting: Cardiovascular Disease

## 2024-04-08 ENCOUNTER — Ambulatory Visit: Admitting: Internal Medicine

## 2024-04-11 ENCOUNTER — Other Ambulatory Visit: Payer: Self-pay | Admitting: Cardiovascular Disease

## 2024-04-11 DIAGNOSIS — I2510 Atherosclerotic heart disease of native coronary artery without angina pectoris: Secondary | ICD-10-CM

## 2024-04-11 DIAGNOSIS — E785 Hyperlipidemia, unspecified: Secondary | ICD-10-CM

## 2024-04-12 ENCOUNTER — Other Ambulatory Visit: Payer: Self-pay | Admitting: Internal Medicine

## 2024-04-12 DIAGNOSIS — G4733 Obstructive sleep apnea (adult) (pediatric): Secondary | ICD-10-CM

## 2024-04-13 ENCOUNTER — Other Ambulatory Visit (HOSPITAL_BASED_OUTPATIENT_CLINIC_OR_DEPARTMENT_OTHER): Payer: Self-pay

## 2024-04-13 MED ORDER — ZEPBOUND 5 MG/0.5ML ~~LOC~~ SOAJ
5.0000 mg | SUBCUTANEOUS | 1 refills | Status: DC
  Filled 2024-04-13: qty 2, 28d supply, fill #0

## 2024-04-14 ENCOUNTER — Encounter: Payer: Self-pay | Admitting: Internal Medicine

## 2024-04-14 ENCOUNTER — Ambulatory Visit: Admitting: Internal Medicine

## 2024-04-14 VITALS — BP 122/64 | HR 60 | Temp 97.5°F | Ht 71.0 in | Wt 246.0 lb

## 2024-04-14 DIAGNOSIS — I5020 Unspecified systolic (congestive) heart failure: Secondary | ICD-10-CM

## 2024-04-14 DIAGNOSIS — Z6834 Body mass index (BMI) 34.0-34.9, adult: Secondary | ICD-10-CM

## 2024-04-14 DIAGNOSIS — I209 Angina pectoris, unspecified: Secondary | ICD-10-CM

## 2024-04-14 DIAGNOSIS — G4733 Obstructive sleep apnea (adult) (pediatric): Secondary | ICD-10-CM | POA: Diagnosis not present

## 2024-04-14 DIAGNOSIS — E785 Hyperlipidemia, unspecified: Secondary | ICD-10-CM

## 2024-04-14 DIAGNOSIS — E538 Deficiency of other specified B group vitamins: Secondary | ICD-10-CM | POA: Diagnosis not present

## 2024-04-14 DIAGNOSIS — E010 Iodine-deficiency related diffuse (endemic) goiter: Secondary | ICD-10-CM | POA: Diagnosis not present

## 2024-04-14 MED ORDER — ZEPBOUND 7.5 MG/0.5ML ~~LOC~~ SOAJ: 7.5000 mg | SUBCUTANEOUS | 12 refills | Status: DC

## 2024-04-14 MED ORDER — B-12 1000 MCG SL SUBL: 1.0000 | SUBLINGUAL_TABLET | Freq: Every day | SUBLINGUAL | 3 refills | Status: AC

## 2024-04-14 NOTE — Progress Notes (Signed)
 ==============================  [facility]: [phone]   -- Medical Office Visit --  Patient: Allen [name]      Age: 78 y.o.       Sex:  male  Date:   04/14/2024 Today's Healthcare Provider: Bernardino KANDICE Cone, MD  ==============================   Chief Complaint: 45 day f/u ( ) Chronic disease monitoring   Discussed the use of AI scribe software for clinical note transcription with the patient, who gave verbal consent to proceed.  History of Present Illness Allen [name] is a 78 year old male with coronary artery disease and congestive heart failure who presents for a 45-day follow-up and chronic disease monitoring.  He has not yet received the electric wheelchair that was ordered for him. He was concerned about the order expiring.  He has a history of coronary artery disease and recently underwent catheterization with stent placement. He uses nitroglycerin  regularly and is taking Plavix  and Eliquis  consistently. He experiences chest pain primarily during exertion and feels less mobile than 45 days ago. He wants to be more active but feels limited by his condition.  He is currently on Repatha  for cholesterol management but is inconsistent with the injections. His last cholesterol check was over a year ago. He is not fasting today, having eaten breakfast at 11 AM.  He is on Prozac  for mood management and reports feeling 'good' and 'a happy cat'. He mentions frequent bleeding.  He has a history of congestive heart failure and peripheral artery disease. He uses a swimming pool regularly for exercise, which is easier on his knees. He has lost weight, going from nearly 290 pounds to 245 pounds, but has gained some weight back since his last visit 45 days ago. He is currently on Zepbound  for weight management and has recently picked up a new dose.  He reports numbness in his hands and feet. He was previously taking a B12 supplement but is unsure if  he is currently taking it. He mentions that he can purchase it over the counter. Medications: not yet discussed Lab Results  Component Value Date   HDL 38.30 (L) 12/31/2022   HDL 36 (L) 04/09/2022   HDL 37.80 (L) 07/26/2020   CHOLHDL 4 12/31/2022   CHOLHDL 5.4 (H) 04/09/2022   CHOLHDL 6 07/26/2020   Lab Results  Component Value Date   LDLCALC 112 (H) 04/09/2022   LDLCALC 146 (H) 12/27/2019   LDLCALC 172 (H) 12/09/2017   LDLDIRECT 89.0 12/31/2022   LDLDIRECT 148.0 07/26/2020   LDLDIRECT 122.0 06/17/2018   Lab Results  Component Value Date   TRIG 206.0 (H) 12/31/2022   TRIG 269 (H) 04/09/2022   TRIG 243.0 (H) 07/26/2020   Lab Results  Component Value Date   CHOL 147 12/31/2022   CHOL 195 04/09/2022   CHOL 210 (H) 07/26/2020   The ASCVD Risk score (Arnett DK, et al., 2019) failed to calculate for the following reasons:   Risk score cannot be calculated because patient has a medical history suggesting prior/existing ASCVD Lab Results  Component Value Date   ALT 18 12/31/2022   AST 16 12/31/2022   ALKPHOS 83 12/31/2022   TSH 2.10 12/31/2022   HGBA1C 6.0 12/31/2022   Body mass index is 34.31 kg/m.  Lipoprotein(a), Apolipoprotein B (ApoB), and High-sensitivity C-reactive protein (hs-CRP) No results found for: HSCRP, LIPOA Improving Your Cholesterol: Diet: Focus on a Mediterranean-style diet, limit saturated fats and sugars, and increase omega-3 fatty acids (fish, flaxseeds,nuts,extra virgin  olive oil, avocados). Exercise: Engage in regular physical activity (aerobic exercises are particularly beneficial for HDL). Weight Management: Maintain a healthy weight. Smoking Cessation: Quitting smoking improves cholesterol levels.  Wt Readings from Last 10 Encounters:  04/14/24 246 lb (111.6 kg)  02/23/24 238 lb 6.4 oz (108.1 kg)  02/03/24 238 lb (108 kg)  12/25/23 250 lb (113.4 kg)  12/02/23 250 lb (113.4 kg)  09/12/23 246 lb 6.4 oz (111.8 kg)  09/05/23 248 lb (112.5  kg)  09/01/23 250 lb (113.4 kg)  03/06/23 268 lb 9.6 oz (121.8 kg)  03/03/23 275 lb 12.8 oz (125.1 kg)       {{No specialty comments available.:1}{ Problem List as of 04/14/2024 Reviewed: 04/14/2024 11:10 AM by Kallie Raeanne DEL, CMA      Active Problems   Thyromegaly   Last Assessment & Plan 06/17/2018 Office Visit Written 06/17/2018  9:42 AM by Mahlon Comer BRAVO, MD  Pt feels thyroid  is enlarging.  Has not had imaging in 4 yrs.  Will repeat US  and determine if things have changed.      Subclavian artery stenosis, left [facility])   Last Assessment & Plan 05/22/2020 Office Visit Written 05/22/2020  3:33 PM by Maxie Herlene POUR, PA-C  Noted in 2019- asymptomatic- followed by Dr Sheree      Status post coronary artery stent placement   Spondylosis   Sleeps in sitting position due to orthopnea   Last Assessment & Plan 01/20/2023 Office Visit Edited 01/20/2023  8:22 PM by Jesus Bernardino MATSU, MD  Encouraged patient to to discuss with pulmonology - sent message to his pulmonologist to discuss Also sent message to cardiologist about recent flare of this, ? If CHF diagnosis is appropriate to add.  He is on fluid pills. Last echocardiogram 05/2021  Close follow up complex patient that I met 2 weeks ago. Accompanied by wife who is concerned about his sleep- he tried to sleep flat on his back instead of recliner when [place] and that led to his severe pulmonary distress briefly which he did not go to the ER for.  He also reports he is not wearing CPAP and not breathing well generally ever since he had a blood clot couple years ago.  So I decided for this visit to focus on cardiopulmonary status and optimize that. In particular I think he may have some type of congestive heart failure even though it is not on his record wife says that someone else thought that before.  Looking at his echocardiogram as he is slightly depressed ventricular function my suspicion is that he has right-sided heart failure  causing chronic venous insufficiency that has caused left-sided heart failure which causes the orthopnea. In other words heart failure with preserved ejection fraction and pulmonary hypertension.  If I can get agreement on these suspected diagnosis  from cardiologist I can probably add spironolactone to help with the orthopnea and Jardiance as well.  Will do another follow-up after I get a chest x-ray and have him meet with his cardiologist for a repeat echocardiogram which he is due for in my opinion.  I shared all these thoughts with Mr. Onita and he felt open to exploring the possibilities also was not even willing to reconsider CPAP variations that might overcome his dilemma with feeling suffocated by the mask and claustrophobic from it      Rotator cuff impingement syndrome of left shoulder   Pulmonary infarct [facility])   Pulmonary embolus (HCC)  Last Assessment & Plan 12/02/2023 Office Visit Written 12/03/2023  7:02 PM by Orlie Madelin RAMAN, NP  History of PE in August 2022 - likely provoked (extensive car trip with risk factors -sedentary lifestyle, obesity, LE edema.) continue on lifelong therapy . Continue on Eliquis  could consider low dose going forward .       Prediabetes   Overuse syndrome of low back   OSA (obstructive sleep apnea)   Last Assessment & Plan 12/02/2023 Office Visit Written 12/03/2023  7:04 PM by Orlie Madelin RAMAN, NP  BIPAP  intolerant       Onychomycosis   Nocturia more than twice per night   Last Assessment & Plan 11/26/2018 Office Visit Written 11/26/2018  9:50 AM by Mahlon Comer BRAVO, MD  New to provider, ongoing for pt.  He is having nocturia at night and urinary frequency and urgency during the day.  Suspect this is either prostate related or due to body habitus.  Refer to Urology for complete evaluation.      Morbid obesity [facility])   Last Assessment & Plan 09/01/2023 Office Visit Written 09/01/2023 11:22 AM by Court Dorn PARAS, MD  History of morbid obesity with a BMI  of 34.  He is lost 25 pounds on Wegovy .      Mobility impaired   Last Assessment & Plan 02/23/2024 Office Visit Written 02/23/2024  8:32 PM by Jesus Bernardino MATSU, MD  Assessment Mobility limitation secondary to severe CAD, HFrEF, COPD, bilateral knee OA, morbid obesity, chronic pain ? unable to perform MRADLs with cane/walker/manual W/C. Recurrent falls & orthostatic intolerance ? high injury risk. Home layout: 34-in doorways except 28-in bathroom door; hallway 40 ft; hardwood floors; adequate turning radius in living areas. Plan / Medical Necessity Statement Power Mobility Device (Group 2 mid-wheel-drive power wheelchair, [employer] [phone]) with captain's seat, swing-away joystick, elevating legrests, and 18-AH batteries is medically necessary for safe, independent performance of MRADLs in the home. Less-complex devices (cane, walker, manual chair, POV/scooter) are unsafe or ineffective (see HPI). Patient can transfer to/from the chair, operate the joystick, and cognitively manage safety features. Will use wheelchair inside home; may use existing scooter for outdoor community distances. Standard Written Order FREYA) completed today (see Section 2). Home assessment to be performed by DME supplier; patient reports 28-in bathroom door--will review possible environmental mods. Prior authorization request to DME MAC ([employer]) with this note, SWO, cath/echo/PFT reports, PT discharge summary. ([employer]) Order repeat transthoracic echo and cardiology follow-up. Reinforce fall-prevention, continue PT HEP, weight management.      Lumbar back pain   Lower extremity edema   Last Assessment & Plan 08/21/2021 Office Visit Written 08/21/2021  4:30 PM by Court Dorn PARAS, MD  History of lower extremity edema managed with oral furosemide .      Leukocytosis   Left shoulder pain   Last Assessment & Plan 02/19/2016 Office Visit Written 02/20/2016  8:14 PM by Mahlon Comer BRAVO, MD  New.  Pt's ROM is severely limited.   Referral to ortho placed.      Irritability   History of pulmonary embolus (PE)   History of non-ST elevation myocardial infarction (NSTEMI)   Last Assessment & Plan 12/25/2023 Office Visit Written 12/25/2023 12:01 PM by Court Dorn PARAS, MD  History of CAD status post catheterization by Dr. Mady 12/26/2019 revealing minimal disease in the LAD, 80% stenosis in the AV groove circumflex which was stented and a chronically occluded RCA with left-to-right collaterals with preserved EF.  Because of progressive nitroglycerin  responsive  chest pain I arrange for him to undergo outpatient cardiac catheterization by Dr. [place] 09/05/2023 revealing an occluded RCA with left-to-right collaterals, patent AV groove circumflex stent and significant proximal LAD stenosis.  Dr. Sw[place]iland stented him with a 3 mm x 12 mm long Synergy drug-eluting stent postdilated with a 3.5 mm balloon.  His symptoms improved after that although he still has effort angina requiring nitroglycerin  on maximum dose of isosorbide  mononitrate (90 mg).  I am going to begin him on ranolazine  500 mg p.o. twice daily.  If this does not improve his effort angina I will refer him back to Dr. Swaziland for consideration of RCA CTO intervention.      HFrEF (heart failure with reduced ejection fraction) [facility])   Last Assessment & Plan 12/25/2023 Office Visit Written 12/25/2023 12:02 PM by Court Dorn PARAS, MD  EF by left-ventricular graffiti at the time of cath in November was normal.      Hearing loss   Hand numbness   Gouty arthropathy   Gastric mass - submucsaol - antrum   Essential hypertension   Last Assessment & Plan 12/25/2023 Office Visit Written 12/25/2023 11:58 AM by Court Dorn PARAS, MD  History of essential hypertension blood pressure measured today at 118/64.  He is on Benicar /hydrochlorothiazide  and metoprolol .      Dyslipidemia, goal LDL below 70   Last Assessment & Plan 12/25/2023 Office Visit Written 12/25/2023 11:58 AM by Court Dorn PARAS, MD   History of dyslipidemia on Repatha  and Crestor  with lipid profile performed 12/31/2022 revealing total cholesterol 147, LDL 112 and HDL of 38.      Dyslipidemia   Depression   De Quervain's tenosynovitis, right   Last Assessment & Plan 04/29/2014 Office Visit Written 05/01/2014 10:16 PM by Candise Aleene DEL, MD  Thumb spica splint rx given to patient.      DDD (degenerative disc disease), thoracic   Daytime sleepiness   Last Assessment & Plan 07/02/2021 Office Visit Written 07/05/2021  2:53 PM by Orlie Madelin RAMAN, NP  Symptoms suspicious for sleep apnea with daytime sleepiness, snoring, witnessed apneic events and obesity.  Set up for split-night sleep study  Plan  Patient Instructions  Symbicort  2 puffs Twice daily, rinse after use.  Continue on Eliquis  5mg  Twice daily  Saline nasal gel Twice daily.  Set up for Split night sleep study .  Healthy sleep regimen .  Follow up with Cardiology as planned next month .   Flu shot today .  Follow up with 3 months with Dr. Neda or Parrett NP and As needed   Please contact office for sooner follow up if symptoms do not improve or worsen or seek emergency care           Coronary artery disease   Last Assessment & Plan 12/02/2023 Office Visit Written 12/03/2023  7:03 PM by Orlie Madelin RAMAN, NP  S/p PCI with DES 2024 . Continue on current regimen and follow up with cards       COPD (chronic obstructive pulmonary disease) with emphysema [facility])   Last Assessment & Plan 12/02/2023 Office Visit Written 12/03/2023  7:01 PM by Orlie Madelin RAMAN, NP  Continue on Symbicort  . Add Spiriva  for improved symptom control   Plan  Patient Instructions  Continue on Symbicort  2 puffs Twice daily  , rinse after use . Begin Spiriva  2 puffs daily   Albuterol  inhaler As needed   Activity as tolerated.    Continue on Eliquis  5mg  Twice daily  Avoid  NSAIDS- no advil, motrin or aleve.   Do not drive if sleepy .  Work on healthy weight loss.   Follow up  with Dr. Neda or Parrett NP in 6 months and As needed          Cold-induced asthma   Circulation disorder of lower extremity   Last Assessment & Plan 12/30/2022 Office Visit Written 12/30/2022  6:32 PM by Jesus Bernardino MATSU, MD  Offered vascular referral - but we will wait and check ankle-brachial index first to decide vein or vascular.. or both      Chronic respiratory failure with hypoxia [facility])   Last Assessment & Plan 03/03/2023 Office Visit Written 03/03/2023  4:34 PM by Orlie Madelin RAMAN, NP  No desaturations on ambulation today in the office.  Patient discontinued his oxygen  earlier this year due to poor coverage from insurance..  Patient does have underlying sleep apnea declines CPAP therapy.  Will not be covered for nocturnal oxygen . Patient declines CPAP or BiPAP therapy.  Plan  Patient Instructions  Stop Symbicort  , Try Breztri  2 puffs Twice daily  , rinse after use .  Albuterol  inhaler As needed   Activity as tolerated.    Continue on Eliquis  5mg  Twice daily  Avoid NSAIDS- no advil, motrin or aleve.   Follow up with cardiology this week as planned.   Do not drive if sleepy .  Work on healthy weight loss.   Follow up with Dr. Neda or Parrett NP in 3-4 months with spirometry with DLCO and As needed    '        Chronic neck pain   Last Assessment & Plan 12/05/2015 Office Visit Written 12/10/2015  8:56 PM by Domenica Harlene LABOR, MD  Continues to struggle with daily pain but is not interested in surgical intervention. Encouraged to try topical patches, may try mild muscle relaxers.      Carotid atherosclerosis   Bilateral primary osteoarthritis of knee   Last Assessment & Plan 12/30/2022 Office Visit Written 12/30/2022  6:33 PM by Jesus Bernardino MATSU, MD  Advised patient to try hinge joint knee brace and physical therapy if surgery not option I can do injections but bone on bone will make this of low value - will see back soon to discuss more.      B12 deficiency   Aortic  atherosclerosis [facility])   Anxiety state   Last Assessment & Plan 04/10/2016 Office Visit Written 04/10/2016  3:32 PM by Mahlon Comer BRAVO, MD  Pt is doing much better since restarting his Wellbutrin  at last visit.  Feels current dose is appropriate and not interested in changing medication at this time.  Will continue to follow.      Anxiety and depression   Last Assessment & Plan 12/05/2015 Office Visit Written 12/10/2015  8:58 PM by Domenica Harlene LABOR, MD  Wife endorses questions about early memory loss but he completes MMSE with score of 30 of 30. wil continue to monitor      Allergic conjunctivitis of both eyes   Last Assessment & Plan 01/20/2023 Office Visit Written 01/20/2023  2:28 PM by Jesus Bernardino MATSU, MD  Wife wrote down olopatadine .   Alternative take benadryl at bedtime.     :1}}  Updated Problem List Entries: No problems updated.  Background Reviewed: Problem List: has Dyslipidemia, goal LDL below 70; Hearing loss; Essential hypertension; Anxiety and depression; De Quervain's tenosynovitis, right; Thyromegaly; Rotator cuff impingement syndrome of left shoulder; COPD (chronic obstructive pulmonary disease) with  emphysema (HCC); Gastric mass - submucsaol - antrum; Chronic neck pain; Morbid obesity (HCC); Left shoulder pain; Anxiety state; Subclavian artery stenosis, left (HCC); Nocturia more than twice per night; Lower extremity edema; History of non-ST elevation myocardial infarction (NSTEMI); Status post coronary artery stent placement; Pulmonary embolus (HCC); Pulmonary infarct (HCC); Leukocytosis; Chronic respiratory failure with hypoxia (HCC); Daytime sleepiness; OSA (obstructive sleep apnea); History of pulmonary embolus (PE); Coronary artery disease; Dyslipidemia; Gouty arthropathy; Spondylosis; Depression; Mobility impaired; Bilateral primary osteoarthritis of knee; Circulation disorder of lower extremity; Prediabetes; Onychomycosis; Cold-induced asthma; Allergic conjunctivitis of both  eyes; Irritability; Overuse syndrome of low back; Hand numbness; B12 deficiency; Lumbar back pain; Aortic atherosclerosis (HCC); Sleeps in sitting position due to orthopnea; DDD (degenerative disc disease), thoracic; Carotid atherosclerosis; and HFrEF (heart failure with reduced ejection fraction) (HCC) on their problem list. Past Medical History:  has a past medical history of Arthritis, CAD (coronary artery disease), Cataract, Chest pain (08/08/2011), Chronic left shoulder pain, Chronic neck pain (09/17/2015), Collagenous colitis (2002), COPD (chronic obstructive pulmonary disease) (HCC), Former smoker (quit 1999), Gastric mass - submucsaol - antrum (03/03/2015), GERD (08/21/2007), GERD (gastroesophageal reflux disease), Health maintenance examination (08/08/2011), Hepatic steatosis (04/2014), Hiatal hernia, Hyperlipidemia, Hypertension, Knee sprain (02/17/2013), Obesity, Class II, BMI 35-39.9, OSA (obstructive sleep apnea) (11/29/2021), Prediabetes (01/2015), Prostate cancer screening (08/08/2011), and Spinal cord trauma. Past Surgical History:   has a past surgical history that includes Tonsillectomy; Carpal tunnel release; Cataract extraction w/ intraocular lens implant; Colonoscopy w/ polypectomy (2011); Cataract extraction w/PHACO (Right, 08/31/2013); Yag laser application (Left, 09/14/2013); Cardiac catheterization (04/2014); left heart catheterization with coronary angiogram (N/A, 04/27/2014); Colonoscopy w/ polypectomy; EUS (N/A, 03/09/2015); Esophagogastroduodenoscopy (02/24/15); Yag laser application (Right, 08/13/2016); LEFT HEART CATH AND CORONARY ANGIOGRAPHY (N/A, 12/27/2019); CORONARY STENT INTERVENTION (N/A, 12/27/2019); LEFT HEART CATH AND CORONARY ANGIOGRAPHY (N/A, 09/05/2023); CORONARY STENT INTERVENTION (N/A, 09/05/2023); and CORONARY PRESSURE/FFR STUDY (N/A, 09/05/2023). Social History:   reports that he quit smoking about 26 years ago. His smoking use included cigarettes. He started smoking about 61  years ago. He has a 70 pack-year smoking history. He has never used smokeless tobacco. He reports that he does not drink alcohol and does not use drugs. Family History:  family history includes Cancer in his mother; Colon polyps in his father; Diabetes in his mother; Heart attack in his father and mother; Heart disease in his brother. Allergies:  is allergic to penicillins, sulfonamide derivatives, sulfa antibiotics, advil [ibuprofen], and atorvastatin .   Medication Reconciliation: Current Outpatient Medications on File Prior to Visit  Medication Sig  . albuterol  (VENTOLIN  HFA) 108 (90 Base) MCG/ACT inhaler INHALE 2 PUFFS BY MOUTH EVERY 6 HOURS AS NEEDED FOR WHEEZING OR SHORTNESS OF BREATH  . allopurinol  (ZYLOPRIM ) 100 MG tablet Take 1 tablet (100 mg total) by mouth daily. (Patient taking differently: Take 100 mg by mouth daily as needed (gout).)  . clopidogrel  (PLAVIX ) 75 MG tablet TAKE 1 TABLET EVERY DAY WITH BREAKFAST  . colchicine  0.6 MG tablet Take 1 tablet (0.6 mg total) by mouth daily. Until flare resolves. Do not take rosuvastatin  or aspirin  when taking this medication. (Patient taking differently: Take 0.6 mg by mouth daily as needed (gout flares.). Until flare resolves. Do not take rosuvastatin  or aspirin  when taking this medication.)  . Cyanocobalamin  ([employer] VITAMIN B12) 1000 MCG TBCR TAKE 1 TABLET BY MOUTH EVERY DAY  . ELIQUIS  5 MG TABS tablet TAKE 1 TABLET BY MOUTH TWICE A DAY  . Evolocumab  (REPATHA  SURECLICK) 140 MG/ML SOAJ INJECT 1 ML INTO THE  SKIN EVERY 14 (FOURTEEN) DAYS.  . FLUoxetine  (PROZAC ) 20 MG tablet TAKE 1 TABLET (20 MG TOTAL) BY MOUTH DAILY. TO START: JUST HALF TABLET DAILY FOR 2 WEEKS  . furosemide  (LASIX ) 20 MG tablet Take 2 tablets (40 mg total) by mouth daily.  . isosorbide  mononitrate (IMDUR ) 30 MG 24 hr tablet TAKE 1/2 OF A TABLET (15 MG TOTAL) BY MOUTH DAILY  . isosorbide  mononitrate (IMDUR ) 60 MG 24 hr tablet TAKE 1 TABLET BY MOUTH EVERY DAY  . metFORMIN  (GLUCOPHAGE )  500 MG tablet TAKE 1 TABLET BY MOUTH 2 TIMES DAILY WITH A MEAL.  . metoprolol  succinate (TOPROL -XL) 50 MG 24 hr tablet TAKE 1 TABLET BY MOUTH EVERY DAY WITH OR IMMEDIATELY FOLLOWING A MEAL  . nitroGLYCERIN  (NITROSTAT ) 0.4 MG SL tablet PLACE 1 TABLET UNDER THE TONGUE EVERY 5 MINUTES AS NEEDED FOR CHEST PAIN. MAX 3, CALL 911  . olmesartan -hydrochlorothiazide  (BENICAR  HCT) 20-12.5 MG tablet Take 1 tablet by mouth daily.  . pantoprazole  (PROTONIX ) 40 MG tablet Take 1 tablet (40 mg total) by mouth daily.  . ranolazine  (RANEXA ) 500 MG 12 hr tablet Take 1 tablet (500 mg total) by mouth 2 (two) times daily.  . rosuvastatin  (CRESTOR ) 20 MG tablet Take 1 tablet (20 mg total) by mouth daily. (Patient taking differently: Take 20 mg by mouth 3 (three) times a week.)  . Semaglutide -Weight Management 2.4 MG/0.75ML SOAJ Inject 2.4 mg into the skin once a week.  . Tiotropium Bromide Monohydrate  (SPIRIVA  RESPIMAT) 1.25 MCG/ACT AERS Inhale 2 puffs into the lungs daily.  . Tiotropium Bromide Monohydrate  (SPIRIVA  RESPIMAT) 2.5 MCG/ACT AERS Inhale 2 puffs into the lungs daily.  . tirzepatide  (ZEPBOUND ) 5 MG/0.5ML Pen Inject 5 mg into the skin once a week.   No current facility-administered medications on file prior to visit.  There are no discontinued medications.   Physical Exam:    04/14/2024   11:13 AM 02/23/2024    9:08 AM 02/03/2024    8:11 AM  Vitals with BMI  Height 5' 11 5' 11 5' 11  Weight 246 lbs 238 lbs 6 oz 238 lbs  BMI 34.33 33.26 33.21  Systolic 122 126   Diastolic 64 66   Pulse 60 70   Vital signs reviewed.  Nursing notes reviewed. Weight trend reviewed. Physical Exam General Appearance:  No acute distress appreciable.   Well-groomed, healthy-appearing male.  Well proportioned with no abnormal fat distribution.  Good muscle tone. Pulmonary:  Normal work of breathing at rest, no respiratory distress apparent. SpO2: 97 %  Musculoskeletal: All extremities are intact.  Neurological:  Awake,  alert, oriented, and engaged.  No obvious focal neurological deficits or cognitive impairments.  Sensorium seems unclouded.   Speech is clear and coherent with logical content. Psychiatric:  Appropriate mood, pleasant and cooperative demeanor, thoughtful and engaged during the exam Physical Exam MEASUREMENTS: Weight- 245. ***  Results:    04/14/2024   11:14 AM 02/03/2024    8:19 AM 01/28/2023    8:07 AM 12/30/2022    2:16 PM  PHQ 2/9 Scores  PHQ - 2 Score 0 0 0 0  PHQ- 9 Score 0  0 8   Results LABS Glucose: Elevated B12: Deficient  DIAGNOSTIC Cardiac catheterization: Stent placement  {Insert previous labs (optional):23779} {See past labs  Heme  Chem  Endocrine  Serology  Results Review (optional):1} No results found for any visits on 04/14/24. Admission on 09/05/2023, Discharged on 09/05/2023  Component Date Value Ref Range Status  . Glucose-Capillary 09/05/2023 90  70 - 99 mg/dL Final  . Comment 1 88/84/7975 Notify RN   Final  . Comment 2 09/05/2023 Document in Chart   Final  . Glucose-Capillary 09/05/2023 102 (H)  70 - 99 mg/dL Final  . Activated Clotting Time 09/05/2023 256  seconds Final  . Activated Clotting Time 09/05/2023 832  seconds Final  . Activated Clotting Time 09/05/2023 245  seconds Final  Office Visit on 09/01/2023  Component Date Value Ref Range Status  . Glucose 09/01/2023 85  70 - 99 mg/dL Final  . BUN 88/88/7975 14  8 - 27 mg/dL Final  . Creatinine, Ser 09/01/2023 0.83  0.76 - 1.27 mg/dL Final  . eGFR 88/88/7975 92  >59 mL/min/1.73 Final  . BUN/Creatinine Ratio 09/01/2023 17  10 - 24 Final  . Sodium 09/01/2023 141  134 - 144 mmol/L Final  . Potassium 09/01/2023 5.0  3.5 - 5.2 mmol/L Final  . Chloride 09/01/2023 100  96 - 106 mmol/L Final  . CO2 09/01/2023 23  20 - 29 mmol/L Final  . Calcium  09/01/2023 9.8  8.6 - 10.2 mg/dL Final  . WBC 88/88/7975 7.2  3.4 - 10.8 x10E3/uL Final  . RBC 09/01/2023 5.25  4.14 - 5.80 x10E6/uL Final  . Hemoglobin  09/01/2023 14.6  13.0 - 17.7 g/dL Final  . Hematocrit 88/88/7975 45.2  37.5 - 51.0 % Final  . MCV 09/01/2023 86  79 - 97 fL Final  . MCH 09/01/2023 27.8  26.6 - 33.0 pg Final  . MCHC 09/01/2023 32.3  31.5 - 35.7 g/dL Final  . RDW 88/88/7975 14.0  11.6 - 15.4 % Final  . Platelets 09/01/2023 253  150 - 450 x10E3/uL Final  Orders Only on 06/02/2023  Component Date Value Ref Range Status  . FVC-Pre 12/02/2023 3.35  L Final  . FVC-%Pred-Pre 12/02/2023 73  % Final  . FEV1-Pre 12/02/2023 2.17  L Final  . FEV1-%Pred-Pre 12/02/2023 65  % Final  . FEV6-Pre 12/02/2023 3.30  L Final  . FEV6-%Pred-Pre 12/02/2023 76  % Final  . Pre FEV1/FVC ratio 12/02/2023 65  % Final  . FEV1FVC-%Pred-Pre 12/02/2023 89  % Final  . Pre FEV6/FVC Ratio 12/02/2023 98  % Final  . FEV6FVC-%Pred-Pre 12/02/2023 104  % Final  . FEF 25-75 Pre 12/02/2023 1.14  L/sec Final  . FEF2575-%Pred-Pre 12/02/2023 47  % Final  . DLCO unc 12/02/2023 21.75  ml/min/mmHg Final  . DLCO unc % pred 12/02/2023 81  % Final  . DLCO cor 12/02/2023 21.75  ml/min/mmHg Final  . DLCO cor % pred 12/02/2023 81  % Final  . DL/VA 97/88/7974 6.13  ml/min/mmHg/L Final  . DL/VA % pred 97/88/7974 98  % Final  Lab on 12/31/2022  Component Date Value Ref Range Status  . Methylmalonic Acid, Quant 12/31/2022 245  87 - 318 nmol/L Final  . Vitamin B-12 12/31/2022 191 (L)  211 - 911 pg/mL Final  . Folate 12/31/2022 8.6  >5.9 ng/mL Final  . Uric Acid, Serum 12/31/2022 6.4  4.0 - 7.8 mg/dL Final  . Hgb J8r MFr Bld 12/31/2022 6.0  4.6 - 6.5 % Final  . TSH 12/31/2022 2.10  0.35 - 5.50 uIU/mL Final  . Cholesterol 12/31/2022 147  0 - 200 mg/dL Final  . Triglycerides 12/31/2022 206.0 (H)  0.0 - 149.0 mg/dL Final  . HDL 96/87/7975 38.30 (L)  >60.99 mg/dL Final  . VLDL 96/87/7975 41.2 (H)  0.0 - 40.0 mg/dL Final  . Total CHOL/HDL Ratio 12/31/2022 4   Final  .  NonHDL 12/31/2022 108.81   Final  . Sodium 12/31/2022 138  135 - 145 mEq/L Final  . Potassium 12/31/2022  4.6  3.5 - 5.1 mEq/L Final  . Chloride 12/31/2022 101  96 - 112 mEq/L Final  . CO2 12/31/2022 27  19 - 32 mEq/L Final  . Glucose, Bld 12/31/2022 104 (H)  70 - 99 mg/dL Final  . BUN 96/87/7975 21  6 - 23 mg/dL Final  . Creatinine, Ser 12/31/2022 0.96  0.40 - 1.50 mg/dL Final  . Total Bilirubin 12/31/2022 0.4  0.2 - 1.2 mg/dL Final  . Alkaline Phosphatase 12/31/2022 83  39 - 117 U/L Final  . AST 12/31/2022 16  0 - 37 U/L Final  . ALT 12/31/2022 18  0 - 53 U/L Final  . Total Protein 12/31/2022 7.5  6.0 - 8.3 g/dL Final  . Albumin 96/87/7975 4.2  3.5 - 5.2 g/dL Final  . GFR 96/87/7975 77.98  >60.00 mL/min Final  . Calcium  12/31/2022 9.8  8.4 - 10.5 mg/dL Final  . WBC 96/87/7975 7.3  4.0 - 10.5 K/uL Final  . RBC 12/31/2022 5.14  4.22 - 5.81 Mil/uL Final  . Platelets 12/31/2022 260.0  150.0 - 400.0 K/uL Final  . Hemoglobin 12/31/2022 14.6  13.0 - 17.0 g/dL Final  . HCT 96/87/7975 43.5  39.0 - 52.0 % Final  . MCV 12/31/2022 84.7  78.0 - 100.0 fl Final  . MCHC 12/31/2022 33.5  30.0 - 36.0 g/dL Final  . RDW 96/87/7975 15.6 (H)  11.5 - 15.5 % Final  . Direct LDL 12/31/2022 89.0  mg/dL Final  Office Visit on 05/06/2022  Component Date Value Ref Range Status  . Glucose 05/06/2022 94  70 - 99 mg/dL Final  . BUN 92/82/7976 22  8 - 27 mg/dL Final  . Creatinine, Ser 05/06/2022 1.06  0.76 - 1.27 mg/dL Final  . eGFR 92/82/7976 74  >59 mL/min/1.73 Final  . BUN/Creatinine Ratio 05/06/2022 21  10 - 24 Final  . Sodium 05/06/2022 143  134 - 144 mmol/L Final  . Potassium 05/06/2022 4.2  3.5 - 5.2 mmol/L Final  . Chloride 05/06/2022 105  96 - 106 mmol/L Final  . CO2 05/06/2022 21  20 - 29 mmol/L Final  . Calcium  05/06/2022 9.5  8.6 - 10.2 mg/dL Final  No image results found. No results found.       Assessment & Plan B12 deficiency  Thyromegaly  OSA (obstructive sleep apnea)  Morbid obesity (HCC)  HFrEF (heart failure with reduced ejection fraction) (HCC)   Assessment and Plan Assessment &  Plan Severe coronary artery disease with angina   Frequent nitroglycerin  use indicates ongoing angina despite recent stent placement and current medications, including Ranexa  and isosorbide  mononitrate. Cardiac rehab is necessary to prevent deconditioning and improve exertion tolerance without worsening chest pain. Send a message to cardiologist Dr. Court about the frequent nitroglycerin  use and inquire about cardiac rehab. Encourage follow-up with cardiology for further management.  Heart failure with reduced ejection fraction (HFrEF)   Managing heart failure symptoms is crucial. Cardiac rehab is needed to prevent deconditioning and improve exertion tolerance without exacerbating chest pain. Encourage follow-up with cardiology for further management.  Hyperlipidemia   Inconsistent use of Repatha  injections causes fluctuating cholesterol levels. The last cholesterol check was over a year ago. Emphasize the importance of consistent medication use and monitoring cholesterol levels. Order a fasting lipid panel and encourage consistent use of Repatha  injections.  Morbid obesity   Weight management medications have  led to some weight loss, but there has been a recent gain. Increasing the Zepbound  dose is expected to improve weight loss outcomes, especially given limited exercise capacity. Recommend dietary modifications. Increase Zepbound  dose from 5 mg to 7.5 mg weekly. Encourage dietary modifications, including low-carb, high-protein snacks. Schedule a follow-up in two months to reassess weight management.  B12 deficiency   Low B12 levels may contribute to heart problems and peripheral neuropathy. Emphasize the importance of B12 supplementation. Prescribe a B12 supplement that dissolves in the mouth for better absorption and encourage over-the-counter B12 supplementation.  General Health Maintenance   Routine blood work is due to monitor kidney, liver function, and other health parameters due to  multiple medications. Order comprehensive blood work, including kidney and liver function tests. Schedule fasting labs before the next follow-up.  Follow-up   Follow up in two months to reassess weight management and other health conditions. Ensure lab work is done before the next visit. Schedule a follow-up appointment in two months and ensure fasting labs are completed before the next visit.  Recording duration: 20 minutes          Orders Placed in Encounter:   Lab Orders  No laboratory test(s) ordered today   Imaging Orders  No imaging studies ordered today   Referral Orders  No referral(s) requested today   No orders of the defined types were placed in this encounter.    No orders of the defined types were placed in this encounter.  ED Discharge Orders     None         This document was synthesized by artificial intelligence (Abridge) using HIPAA-compliant recording of the clinical interaction;   We discussed the use of AI scribe software for clinical note transcription with the patient, who gave verbal consent to proceed. additional Info: This encounter employed state-of-the-art, real-time, collaborative documentation. The patient actively reviewed and assisted in updating their electronic medical record on a shared screen, ensuring transparency and facilitating joint problem-solving for the problem list, overview, and plan. This approach promotes accurate, informed care. The treatment plan was discussed and reviewed in detail, including medication safety, potential side effects, and all patient questions. We confirmed understanding and comfort with the plan. Follow-up instructions were established, including contacting the office for any concerns, returning if symptoms worsen, persist, or new symptoms develop, and precautions for potential emergency department visits.

## 2024-04-15 NOTE — Assessment & Plan Note (Signed)
 Inconsistent use of Repatha  injections causes fluctuating cholesterol levels. The last cholesterol check was over a year ago. Emphasize the importance of consistent medication use and monitoring cholesterol levels. Order a fasting lipid panel and encourage consistent use of Repatha  injections.

## 2024-04-15 NOTE — Addendum Note (Signed)
 Addended by: Jermiyah Ricotta G on: 04/15/2024 09:02 AM   Modules accepted: Level of Service

## 2024-04-15 NOTE — Assessment & Plan Note (Signed)
 Basis for Zepbound  titration, along with morbid obesity.

## 2024-04-15 NOTE — Assessment & Plan Note (Signed)
 Managing heart failure symptoms is crucial. Cardiac rehab is needed to prevent deconditioning and improve exertion tolerance without exacerbating chest pain. Encourage follow-up with cardiology for further management.

## 2024-04-15 NOTE — Assessment & Plan Note (Signed)
 Weight management medications have led to some weight loss, but there has been a recent gain. Increasing the Zepbound  dose is expected to improve weight loss outcomes, especially given limited exercise capacity. Recommend dietary modifications. Increase Zepbound  dose from 5 mg to 7.5 mg weekly. Encourage dietary modifications, including low-carb, high-protein snacks. Schedule a follow-up in two months to reassess weight management.

## 2024-04-15 NOTE — Assessment & Plan Note (Signed)
 Will order lab testing to guide management.

## 2024-04-15 NOTE — Assessment & Plan Note (Signed)
 Low B12 levels may contribute to heart problems and peripheral neuropathy. Emphasize the importance of B12 supplementation. Prescribe a B12 supplement that dissolves in the mouth for better absorption and encourage over-the-counter B12 supplementation.

## 2024-04-15 NOTE — Patient Instructions (Signed)
 VISIT SUMMARY:  You came in today for a follow-up on your coronary artery disease, congestive heart failure, and other chronic conditions. We discussed your current medications, symptoms, and the need for consistent use of your treatments. We also talked about your recent weight changes and the importance of regular exercise and dietary modifications.  YOUR PLAN:  -SEVERE CORONARY ARTERY DISEASE WITH ANGINA: This condition involves the narrowing of the coronary arteries, which can cause chest pain (angina). You are using nitroglycerin  frequently, indicating ongoing chest pain. We will contact your cardiologist, Dr. Court, about starting cardiac rehab to help improve your exercise tolerance and manage your symptoms better. Please follow up with your cardiologist for further management.  -HEART FAILURE WITH REDUCED EJECTION FRACTION (HFREF): This condition means your heart is not pumping blood as well as it should. Cardiac rehab is recommended to help improve your exercise tolerance and manage your symptoms. Please follow up with your cardiologist for further management.  -HYPERLIPIDEMIA: This condition involves having high cholesterol levels. You have been inconsistent with your Repatha  injections, which can cause your cholesterol levels to fluctuate. We will order a fasting lipid panel to check your cholesterol levels and encourage you to use your Repatha  injections consistently.  -MORBID OBESITY: This condition involves having a very high body weight. You have experienced some weight loss with your current medications, but there has been a recent gain. We will increase your Zepbound  dose from 5 mg to 7.5 mg weekly and recommend dietary modifications, including low-carb, high-protein snacks. We will reassess your weight management in two months.  -B12 DEFICIENCY: This condition means you have low levels of vitamin B12, which can contribute to heart problems and numbness in your hands and feet. We will  prescribe a B12 supplement that dissolves in your mouth for better absorption and encourage you to take over-the-counter B12 supplements.  -GENERAL HEALTH MAINTENANCE: Routine blood work is needed to monitor your kidney and liver function and other health parameters due to your multiple medications. We will order comprehensive blood work, including kidney and liver function tests. Please schedule fasting labs before your next follow-up.  INSTRUCTIONS:  Please follow up in two months to reassess your weight management and other health conditions. Ensure that you complete your lab work before the next visit. Schedule a follow-up appointment in two months and make sure to do the fasting labs before the next visit.

## 2024-04-20 ENCOUNTER — Telehealth: Payer: Self-pay

## 2024-04-20 NOTE — Telephone Encounter (Signed)
 Faxed  back over documents signed for [employer] to [phone]

## 2024-04-25 ENCOUNTER — Other Ambulatory Visit: Payer: Self-pay | Admitting: Cardiovascular Disease

## 2024-04-25 ENCOUNTER — Other Ambulatory Visit: Payer: Self-pay | Admitting: Pulmonary Disease

## 2024-05-04 ENCOUNTER — Telehealth: Payer: Self-pay

## 2024-05-04 NOTE — Telephone Encounter (Signed)
 Copied from CRM [id number]. Topic: Clinical - Order For Equipment >> May 04, 2024 12:01 PM Harlene ORN wrote: Reason for CRM: Christine[employer] calling to follow up on trying to attain a power chair for the patient Document was faxed on 06/24th, but it was not given a signature by the physician (on the top of page 15 under Wheelchair Evaluation) Company will refax the document today. Please sign and fax back. Fax: [phone] Phone: [phone]. [phone]  Tried to call Christine left voicemail to let her know I do not have documents here and that we need new documents fx back over

## 2024-05-06 ENCOUNTER — Telehealth: Payer: Self-pay

## 2024-05-06 NOTE — Telephone Encounter (Signed)
 Sent forms over signed to [employer] to [phone]

## 2024-05-19 ENCOUNTER — Other Ambulatory Visit

## 2024-05-26 ENCOUNTER — Encounter: Admitting: Internal Medicine

## 2024-05-26 ENCOUNTER — Other Ambulatory Visit

## 2024-05-28 ENCOUNTER — Telehealth: Payer: Self-pay | Admitting: Internal Medicine

## 2024-05-28 ENCOUNTER — Other Ambulatory Visit: Payer: Self-pay | Admitting: Internal Medicine

## 2024-05-28 NOTE — Telephone Encounter (Unsigned)
 Copied from CRM #[id number]. Topic: Clinical - Medication Refill >> May 28, 2024  9:53 AM Lavanda D wrote: Medication: nitroGLYCERIN  (NITROSTAT ) 0.4 MG SL tablet [Pharmacy Med Name: NITROGLYCERIN  0.4 MG TABLET SL] [[id number]]  Has the patient contacted their pharmacy? Yes (Agent: If no, request that the patient contact the pharmacy for the refill. If patient does not wish to contact the pharmacy document the reason why and proceed with request.) (Agent: If yes, when and what did the pharmacy advise?)  This is the patient's preferred pharmacy:  [id number] - [place], [place] - [address] [place] [address] Phone: [phone] Fax: [phone]  Is this the correct pharmacy for this prescription? Yes If no, delete pharmacy and type the correct one.   Has the prescription been filled recently? No  Is the patient out of the medication? No  Has the patient been seen for an appointment in the last year OR does the patient have an upcoming appointment? Yes  Can we respond through MyChart? No  Agent: Please be advised that Rx refills may take up to 3 business days. We ask that you follow-up with your pharmacy.

## 2024-05-28 NOTE — Telephone Encounter (Signed)
 Sent to provider for refill

## 2024-05-31 ENCOUNTER — Ambulatory Visit: Payer: Medicare Other | Admitting: Adult Health

## 2024-06-17 ENCOUNTER — Telehealth: Payer: Self-pay | Admitting: Internal Medicine

## 2024-06-17 NOTE — Telephone Encounter (Signed)
 Copied from CRM [id number]. Topic: Clinical - Medication Question >> Jun 17, 2024  3:55 PM Jayma L wrote: Reason for CRM: Lorrene with [employer] called in asking if ant statin' medicine had been requested. Advised I seen the patient gets rosuvastatin  (CRESTOR ) 20 MG tablet and asked if this was the medicine he wondered about, he stated he was unsure and it would be up to the PCP what they want to prescribe. Please callback Lorrene at [phone]  [phone] >> Jun 17, 2024  4:01 PM Porter CROME wrote: Lorrene works 10am-7pm and takes break from 2-3pm

## 2024-07-02 ENCOUNTER — Ambulatory Visit: Admitting: Adult Health

## 2024-07-02 ENCOUNTER — Encounter: Payer: Self-pay | Admitting: Adult Health

## 2024-07-13 ENCOUNTER — Telehealth: Payer: Self-pay | Admitting: Pharmacy Technician

## 2024-07-13 NOTE — Telephone Encounter (Signed)
   Pharmacy Patient Advocate Encounter   Received notification from CoverMyMeds that prior authorization for repatha  is required/requested.   Insurance verification completed.   The patient is insured through [facility] .   Per test claim: PA required; PA submitted to above mentioned insurance via Latent Key/confirmation #/EOC [facility] Status is pending

## 2024-07-14 ENCOUNTER — Other Ambulatory Visit: Payer: Self-pay

## 2024-07-14 MED ORDER — ISOSORBIDE MONONITRATE ER 30 MG PO TB24: 15.0000 mg | ORAL_TABLET | Freq: Every day | ORAL | 2 refills | Status: AC

## 2024-07-14 NOTE — Telephone Encounter (Signed)
 Pharmacy Patient Advocate Encounter  Received notification from [facility] that Prior Authorization for repatha  has been APPROVED from 07/13/24 to 07/13/25   PA #/Case ID/Reference #: [id number]

## 2024-08-03 ENCOUNTER — Other Ambulatory Visit (HOSPITAL_COMMUNITY): Payer: Self-pay

## 2024-08-03 ENCOUNTER — Telehealth: Payer: Self-pay | Admitting: Pharmacy Technician

## 2024-08-03 NOTE — Telephone Encounter (Signed)
   We received this prior authorization for repatha  for Allen [name] dob [birth year] but this went through insurance just fine for free copay. Ndc [id number] .

## 2024-08-19 ENCOUNTER — Ambulatory Visit (INDEPENDENT_AMBULATORY_CARE_PROVIDER_SITE_OTHER)

## 2024-08-19 ENCOUNTER — Ambulatory Visit: Admitting: Adult Health

## 2024-08-19 ENCOUNTER — Encounter: Payer: Self-pay | Admitting: Adult Health

## 2024-08-19 VITALS — BP 112/54 | HR 69 | Ht 72.0 in | Wt 256.2 lb

## 2024-08-19 DIAGNOSIS — J449 Chronic obstructive pulmonary disease, unspecified: Secondary | ICD-10-CM

## 2024-08-19 DIAGNOSIS — I2694 Multiple subsegmental pulmonary emboli without acute cor pulmonale: Secondary | ICD-10-CM | POA: Diagnosis not present

## 2024-08-19 DIAGNOSIS — G4733 Obstructive sleep apnea (adult) (pediatric): Secondary | ICD-10-CM

## 2024-08-19 DIAGNOSIS — J9611 Chronic respiratory failure with hypoxia: Secondary | ICD-10-CM | POA: Diagnosis not present

## 2024-08-19 DIAGNOSIS — Z23 Encounter for immunization: Secondary | ICD-10-CM | POA: Diagnosis not present

## 2024-08-19 MED ORDER — ALBUTEROL SULFATE HFA 108 (90 BASE) MCG/ACT IN AERS: 1.0000 | INHALATION_SPRAY | Freq: Four times a day (QID) | RESPIRATORY_TRACT | 3 refills | Status: AC | PRN

## 2024-08-19 MED ORDER — APIXABAN 2.5 MG PO TABS: 2.5000 mg | ORAL_TABLET | Freq: Two times a day (BID) | ORAL | 5 refills | Status: AC

## 2024-08-19 MED ORDER — APIXABAN 5 MG PO TABS: 5.0000 mg | ORAL_TABLET | Freq: Two times a day (BID) | ORAL | 5 refills | Status: DC

## 2024-08-19 MED ORDER — BUDESONIDE-FORMOTEROL FUMARATE 160-4.5 MCG/ACT IN AERO: 2.0000 | INHALATION_SPRAY | Freq: Two times a day (BID) | RESPIRATORY_TRACT | 5 refills | Status: AC

## 2024-08-19 MED ORDER — SPIRIVA RESPIMAT 2.5 MCG/ACT IN AERS: 2.0000 | INHALATION_SPRAY | Freq: Every day | RESPIRATORY_TRACT | 5 refills | Status: AC
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
## 2024-08-19 NOTE — Patient Instructions (Addendum)
 Continue on Symbicort  2 puffs Twice daily  , rinse after use . Continue Spiriva  2 puffs daily   Albuterol  inhaler As needed   Activity as tolerated.    Decrease Eliquis  2.5mg  Twice daily  Avoid NSAIDS- no advil, motrin or aleve.   Do not drive if sleepy .  Work on healthy weight loss.   Flu shot today   Oxygen  2l/m with activity to keep O2 sats >90%.   Follow up with Dr. Neda or Amaree Leeper NP in 6 months and As needed

## 2024-08-26 ENCOUNTER — Ambulatory Visit: Payer: Self-pay | Admitting: Adult Health

## 2024-09-17 ENCOUNTER — Other Ambulatory Visit: Payer: Self-pay | Admitting: Internal Medicine

## 2024-10-11 ENCOUNTER — Telehealth: Payer: Self-pay

## 2024-10-11 NOTE — Telephone Encounter (Signed)
 Copied from CRM #[id number]. Topic: Clinical - Medication Prior Auth >> Oct 11, 2024  3:33 PM Alfonso ORN wrote: Reason for CRM: [employer] on behalf of pt rec fax from office 08/12 stating pt intolerance to rx no specification of side effect pt experienced with statin therapy . side effects needed for insurance   fax: [phone] callback: [phone]  Review and advise

## 2024-10-12 NOTE — Telephone Encounter (Signed)
 Due to a lot of muscle cramps is what pt stated called him by phone

## 2024-10-19 ENCOUNTER — Telehealth: Payer: Self-pay

## 2024-10-19 NOTE — Telephone Encounter (Signed)
 Please review and need note so I can fax over to insurance please   Copied from CRM [id number]. Topic: Clinical - Medication Prior Auth >> Oct 11, 2024  3:33 PM Alfonso ORN wrote: Reason for CRM: [employer] on behalf of pt rec fax from office 08/12 stating pt intolerance to rx no specification of side effect pt experienced with statin therapy . side effects needed for insurance   fax: [phone] callback: [phone] >> Oct 19, 2024 10:18 AM Shanda MATSU wrote: [facility] clinical pharmacist w/[employer] called in to check status of what side effects the patient had to statin therapy, adv caller of notes indicating muscle cramps, caller is req that ICD code G72.0 be added to a new encounter or note on patient's most recent visit to indicate this side effect to statin therapy, caller also stated that this has to be updated yearly on patient's chart, stated it is a Medicare requirement. Caller also wanted to know if form that was faxed to us  to fill out describing this side effect was recvd and if so can it be filled out and sent back, she is going to fax form again in case it was not recvd.

## 2024-10-22 ENCOUNTER — Telehealth: Payer: Self-pay

## 2024-10-22 NOTE — Telephone Encounter (Signed)
 Faxing over form [employer] at [phone]

## 2024-10-22 NOTE — Telephone Encounter (Signed)
 Tried to call [employer] back no answer left message we did not received form for provider to sign and fill out.need form so we can get this taken care

## 2024-11-05 ENCOUNTER — Encounter: Payer: Self-pay | Admitting: Cardiovascular Disease

## 2024-11-05 NOTE — Telephone Encounter (Signed)
" °   Pt c/o medication issue:  1. Name of Medication: WEGOVY  0.5 MG/0.5ML   2. How are you currently taking this medication (dosage and times per day)? Not taking med currently   3. Are you having a reaction (difficulty breathing--STAT)? No  4. What is your medication issue? Patient wants to know if he can switch from   tirzepatide  (ZEPBOUND ) 7.5 MG/0.5ML Pen   to  WEGOVY  0.5 MG/0.5ML  "

## 2024-11-09 ENCOUNTER — Telehealth: Payer: Self-pay | Admitting: Pharmacy Technician

## 2024-11-09 ENCOUNTER — Other Ambulatory Visit (HOSPITAL_COMMUNITY): Payer: Self-pay

## 2024-11-09 NOTE — Telephone Encounter (Signed)
 Pharmacy Patient Advocate Encounter  Received notification from [facility] that Prior Authorization for Wegovy  has been APPROVED from 11/09/24 to 11/09/25. Ran test claim, Copay is $421.07- one month. This test claim was processed through [facility]- copay amounts may vary at other pharmacies due to pharmacy/plan contracts, or as the patient moves through the different stages of their insurance plan.   PA #/Case ID/Reference #: [id number]

## 2024-11-09 NOTE — Telephone Encounter (Signed)
 Pharmacy Patient Advocate Encounter   Received notification from Physician's Office that prior authorization for wegovy  is required/requested.   Insurance verification completed.   The patient is insured through [employer].   Per test claim: PA required; PA submitted to above mentioned insurance via Latent Key/confirmation #/EOC [id number] Status is pending

## 2024-11-10 ENCOUNTER — Ambulatory Visit: Attending: Cardiovascular Disease | Admitting: Cardiovascular Disease

## 2024-11-10 ENCOUNTER — Encounter: Payer: Self-pay | Admitting: Cardiovascular Disease

## 2024-11-10 VITALS — BP 124/64 | HR 60 | Ht 72.0 in | Wt 263.0 lb

## 2024-11-10 DIAGNOSIS — E785 Hyperlipidemia, unspecified: Secondary | ICD-10-CM

## 2024-11-10 DIAGNOSIS — I10 Essential (primary) hypertension: Secondary | ICD-10-CM

## 2024-11-10 DIAGNOSIS — I5020 Unspecified systolic (congestive) heart failure: Secondary | ICD-10-CM

## 2024-11-10 DIAGNOSIS — I2699 Other pulmonary embolism without acute cor pulmonale: Secondary | ICD-10-CM

## 2024-11-10 DIAGNOSIS — Z955 Presence of coronary angioplasty implant and graft: Secondary | ICD-10-CM

## 2024-11-10 NOTE — Assessment & Plan Note (Signed)
 History of essential hypertension blood pressure measured today 124/64.  He is on metoprolol , olmesartan  and hydrochlorothiazide .

## 2024-11-10 NOTE — Progress Notes (Signed)
 "     11/10/2024 ASIM GERSTEN   Jun 04, 1946  [id number]  Primary Physician Jesus Bernardino MATSU, MD Primary Cardiologist: Dorn JINNY Lesches MD [facility] [place], [place]  HPI:  Allen [name] is a 79 y.o.    mildly overweight married Caucasian male father of 6 (4 biologic, 2 adopted), grandfather of 6 grandchildren who  was referred by Dr. Mahlon for cardiovascular evaluation because of chest pain.  He is retired emergency planning/management officer in [place].  I last saw him in the office 12/25/2023.  He is accompanied by his wife Dorthea today...  He has had cardiac catheterizations in the past, one remotely and 1 performed by Dr. Wonda 04/26/14 revealing noncritical CAD with normal LV function. His pain at that time was thought to be noncardiac. His problems include remote tobacco abuse having smoked 35-50 pack years and stopped 20 years ago. He also has a History of hypertension and hyperlipidemia. He has never had a heart attack or stroke. He's had chest pain which began a year ago that occurs monthly. He experienced 3 episodes over Christmas which were more worrisome.   I performed Myoview  stress testing on him 12/05/2016 that showed mild ischemia in the RCA territory.  Apparently at his previous cath in 2015 he did have moderate RCA disease.  I elected to treat him medically at that time.  He was admitted 12/26/2019 with a non-STEMI and underwent cardiac catheterization the following day by Dr. Mady revealing minimal disease in the LAD, 80% mid AV groove circumflex with which was stented with a 2.5 mm x 18 mm long Medtronic resolute Onyx drug-eluting stent.  His RCA was occluded with left-to-right collaterals and his EF was preserved in the 50% range with inferior hypokinesia.  He was complaining of effort angina and I referred him for outpatient cardiac cath 4 days later by Dr. Jordan on 09/05/2023 which revealed normal LV systolic function, and occluded RCA which was chronic with left-to-right collaterals, patent AV groove  circumflex stent and significant proximal LAD disease which was stented using a 3 mm x 12 mm long Synergy drug-eluting stent.    Since I saw him 9 months ago he still using a bottle of nitroglycerin  a month.  He is on high dose Imdur .  I am going to refer him back to Dr. Jordan for consideration of RCA CTO intervention.  Active Medications[1]   Allergies[2]  Social History   Socioeconomic History   Marital status: Married    Spouse name: Not on file   Number of children: 6   Years of education: Not on file   Highest education level: Not on file  Occupational History   Occupation: retired  Tobacco Use   Smoking status: Former    Current packs/day: 0.00    Average packs/day: 2.0 packs/day for 35.0 years (70.0 ttl pk-yrs)    Types: Cigarettes    Start date: 10/21/1962    Quit date: 10/21/1997    Years since quitting: 27.0   Smokeless tobacco: Never   Tobacco comments:    2-3 ppd  Substance and Sexual Activity   Alcohol use: No    Alcohol/week: 0.0 standard drinks of alcohol   Drug use: No   Sexual activity: Yes    Birth control/protection: None  Other Topics Concern   Not on file  Social History Narrative   Married, 6 kids (2 adopted).   Retired emergency planning/management officer.   Former smoker.  No signif alc.     No drugs.  Social Drivers of Health   Tobacco Use: Medium Risk (11/10/2024)   Patient History    Smoking Tobacco Use: Former    Smokeless Tobacco Use: Never    Passive Exposure: Not on file  Financial Resource Strain: Low Risk (02/03/2024)   Overall Financial Resource Strain (CARDIA)    Difficulty of Paying Living Expenses: Not hard at all  Food Insecurity: No Food Insecurity (02/03/2024)   Hunger Vital Sign    Worried About Running Out of Food in the Last Year: Never true    Ran Out of Food in the Last Year: Never true  Transportation Needs: No Transportation Needs (02/03/2024)   PRAPARE - Administrator, Civil Service (Medical): No    Lack of Transportation  (Non-Medical): No  Physical Activity: Insufficiently Active (02/03/2024)   Exercise Vital Sign    Days of Exercise per Week: 5 days    Minutes of Exercise per Session: 20 min  Stress: No Stress Concern Present (02/03/2024)   [employer] of Occupational Health - Occupational Stress Questionnaire    Feeling of Stress : Not at all  Social Connections: Moderately Integrated (02/03/2024)   Social Connection and Isolation Panel    Frequency of Communication with Friends and Family: More than three times a week    Frequency of Social Gatherings with Friends and Family: More than three times a week    Attends Religious Services: More than 4 times per year    Active Member of [employer] or Organizations: No    Attends Banker Meetings: Never    Marital Status: Married  Catering Manager Violence: Not At Risk (02/03/2024)   Humiliation, Afraid, Rape, and Kick questionnaire    Fear of Current or Ex-Partner: No    Emotionally Abused: No    Physically Abused: No    Sexually Abused: No  Depression (PHQ2-9): Low Risk (04/14/2024)   Depression (PHQ2-9)    PHQ-2 Score: 0  Alcohol Screen: Low Risk (02/03/2024)   Alcohol Screen    Last Alcohol Screening Score (AUDIT): 0  Housing: Unknown (02/03/2024)   Housing Stability Vital Sign    Unable to Pay for Housing in the Last Year: No    Number of Times Moved in the Last Year: Not on file    Homeless in the Last Year: No  Utilities: Not At Risk (02/03/2024)   AHC Utilities    Threatened with loss of utilities: No  Health Literacy: Adequate Health Literacy (02/03/2024)   B1300 Health Literacy    Frequency of need for help with medical instructions: Never     Review of Systems: General: negative for chills, fever, night sweats or weight changes.  Cardiovascular: negative for chest pain, dyspnea on exertion, edema, orthopnea, palpitations, paroxysmal nocturnal dyspnea or shortness of breath Dermatological: negative for rash Respiratory:  negative for cough or wheezing Urologic: negative for hematuria Abdominal: negative for nausea, vomiting, diarrhea, bright red blood per rectum, melena, or hematemesis Neurologic: negative for visual changes, syncope, or dizziness All other systems reviewed and are otherwise negative except as noted above.    Blood pressure 124/64, pulse 60, height 6' (1.829 m), weight 263 lb (119.3 kg).  General appearance: alert and no distress Neck: no adenopathy, no carotid bruit, no JVD, supple, symmetrical, trachea midline, and thyroid  not enlarged, symmetric, no tenderness/mass/nodules Lungs: clear to auscultation bilaterally Heart: regular rate and rhythm, S1, S2 normal, no murmur, click, rub or gallop Extremities: extremities normal, atraumatic, no cyanosis or edema Pulses: 2+ and symmetric  Skin: Skin color, texture, turgor normal. No rashes or lesions Neurologic: Grossly normal  EKG EKG Interpretation Date/Time:  Wednesday November 10 2024 11:42:23 EST Ventricular Rate:  60 PR Interval:  180 QRS Duration:  118 QT Interval:  426 QTC Calculation: 426 R Axis:   54  Text Interpretation: Normal sinus rhythm Minimal voltage criteria for LVH, may be normal variant ( Cornell product ) Possible Inferior infarct , age undetermined Cannot rule out Anterior infarct (cited on or before 05-Sep-2023) When compared with ECG of 12-Sep-2023 14:36, No significant change was found Confirmed by Court Carrier [phone]) on 11/10/2024 12:11:04 PM    ASSESSMENT AND PLAN:   Dyslipidemia, goal LDL below 70 History of dyslipidemia on Repatha  with lipid profile performed 12/31/2022 Rakeya Glab total cholesterol 147, LDL Milton Sagona HDL 38.  We will repeat a fasting lipid profile this morning.  Essential hypertension History of essential hypertension blood pressure measured today 124/64.  He is on metoprolol , olmesartan  and hydrochlorothiazide .  Status post coronary artery stent placement History of CAD status post cath by Dr.  Wonda 04/26/2014 revealing noncritical CAD.  I performed Myoview  stress testing 12/05/2016 revealing mild ischemia in the RCA territory.  I treated him medically at that time.  He was readmitted 12/26/2019 with non-STEMI and underwent cath by Dr. Mady revealing minimal LAD disease, 80% mid AV groove circumflex disease which was stented using a 2.5 mm x 18 mm long Medtronic resolute Onyx drug-eluting stent.  RCA was occluded with left-to-right collaterals and his EF was preserved.  Because of ongoing effort angina he underwent cath by Dr. Jordan 09/05/2023 revealing normal LV systolic function, and occluded RCA which is chronic with left-to-right collaterals, patent AV groove circumflex stent with proximal LAD disease which was stented with a 3 mm x 12 mm long Synergy drug-eluting stent.  His symptoms did improve at that time but currently he is going through a bottle of nitroglycerin  a month on high-dose Imdur .  I am referring him back back to Dr. Jordan for consideration of CTO intervention.  Pulmonary embolus (HCC) History of pulmonary embolus on Eliquis  oral anticoagulation chronically.  HFrEF (heart failure with reduced ejection fraction) (HCC) Last echo performed 8//22 revealed EF of 45 to 50% with grade 1 diastolic dysfunction.  Will repeat a 2D echocardiogram.     Carrier DOROTHA Court MD [facility], FSCAI 11/10/2024 12:24 PM     [1]  Current Meds  Medication Sig   albuterol  (VENTOLIN  HFA) 108 (90 Base) MCG/ACT inhaler Inhale 1-2 puffs into the lungs every 6 (six) hours as needed for wheezing or shortness of breath.   allopurinol  (ZYLOPRIM ) 100 MG tablet Take 1 tablet (100 mg total) by mouth daily. (Patient taking differently: Take 100 mg by mouth daily as needed (gout).)   apixaban  (ELIQUIS ) 2.5 MG TABS tablet Take 1 tablet (2.5 mg total) by mouth 2 (two) times daily.   budesonide -formoterol  (SYMBICORT ) 160-4.5 MCG/ACT inhaler Inhale 2 puffs into the lungs 2 (two) times daily.   clopidogrel   (PLAVIX ) 75 MG tablet TAKE 1 TABLET EVERY DAY WITH BREAKFAST   colchicine  0.6 MG tablet Take 1 tablet (0.6 mg total) by mouth daily. Until flare resolves. Do not take rosuvastatin  or aspirin  when taking this medication. (Patient taking differently: Take 0.6 mg by mouth daily as needed (gout flares.). Until flare resolves. Do not take rosuvastatin  or aspirin  when taking this medication.)   Evolocumab  (REPATHA  SURECLICK) 140 MG/ML SOAJ INJECT 1 ML INTO THE SKIN EVERY 14 (FOURTEEN) DAYS.   FLUoxetine  (PROZAC ) 20  MG tablet TAKE 1 TABLET (20 MG TOTAL) BY MOUTH DAILY. TO START: JUST HALF TABLET DAILY FOR 2 WEEKS   furosemide  (LASIX ) 20 MG tablet Take 2 tablets (40 mg total) by mouth daily.   isosorbide  mononitrate (IMDUR ) 30 MG 24 hr tablet Take 0.5 tablets (15 mg total) by mouth daily.   isosorbide  mononitrate (IMDUR ) 60 MG 24 hr tablet TAKE 1 TABLET BY MOUTH EVERY DAY   metFORMIN  (GLUCOPHAGE ) 500 MG tablet TAKE 1 TABLET BY MOUTH 2 TIMES DAILY WITH A MEAL.   metoprolol  succinate (TOPROL -XL) 50 MG 24 hr tablet TAKE 1 TABLET BY MOUTH EVERY DAY WITH OR IMMEDIATELY FOLLOWING A MEAL   nitroGLYCERIN  (NITROSTAT ) 0.4 MG SL tablet PLACE 1 TABLET UNDER THE TONGUE EVERY 5 MINUTES AS NEEDED FOR CHEST PAIN. MAX 3, CALL 911   olmesartan -hydrochlorothiazide  (BENICAR  HCT) 20-12.5 MG tablet Take 1 tablet by mouth daily.   pantoprazole  (PROTONIX ) 40 MG tablet Take 1 tablet (40 mg total) by mouth daily.   ranolazine  (RANEXA ) 500 MG 12 hr tablet Take 1 tablet (500 mg total) by mouth 2 (two) times daily.   rosuvastatin  (CRESTOR ) 20 MG tablet Take 1 tablet (20 mg total) by mouth daily. (Patient taking differently: Take 20 mg by mouth 3 (three) times a week.)   Tiotropium Bromide (SPIRIVA  RESPIMAT) 2.5 MCG/ACT AERS Inhale 2 puffs into the lungs daily.  [2]  Allergies Allergen Reactions   Penicillins Hives and Other (See Comments)    Has patient had a PCN reaction causing immediate rash, facial/tongue/throat swelling, SOB or  lightheadedness with hypotension: no Has patient had a PCN reaction causing severe rash involving mucus membranes or skin necrosis: no Has patient had a PCN reaction that required hospitalization no - childhood reaction Has patient had a PCN reaction occurring within the last 10 years: no - childhood reaction If all of the above answers are NO, then may proceed with Cephalosporin use.    Sulfonamide Derivatives Hives   Sulfa Antibiotics Hives   Advil [Ibuprofen] Swelling and Other (See Comments)    Lip swelling   Atorvastatin  Other (See Comments)    Muscle pain   "

## 2024-11-10 NOTE — Assessment & Plan Note (Signed)
 History of dyslipidemia on Repatha  with lipid profile performed 12/31/2022 [place] Nunnery total cholesterol 147, LDL Cele Mote HDL 38.  We will repeat a fasting lipid profile this morning.

## 2024-11-10 NOTE — Patient Instructions (Signed)
 Medication Instructions:  Your physician recommends that you continue on your current medications as directed. Please refer to the Current Medication list given to you today.  *If you need a refill on your cardiac medications before your next appointment, please call your pharmacy*  Lab Work: Today-Lipid/liver panel  If you have labs (blood work) drawn today and your tests are completely normal, you will receive your results only by: MyChart Message (if you have MyChart) OR A paper copy in the mail If you have any lab test that is abnormal or we need to change your treatment, we will call you to review the results.  Testing/Procedures: Your physician has requested that you have an echocardiogram. Echocardiography is a painless test that uses sound waves to create images of your heart. It provides your doctor with information about the size and shape of your heart and how well your hearts chambers and valves are working. This procedure takes approximately one hour. There are no restrictions for this procedure. Please do NOT wear cologne, perfume, aftershave, or lotions (deodorant is allowed). Please arrive 15 minutes prior to your appointment time.  Please note: We ask at that you not bring children with you during ultrasound (echo/ vascular) testing. Due to room size and safety concerns, children are not allowed in the ultrasound rooms during exams. Our front office staff cannot provide observation of children in our lobby area while testing is being conducted. An adult accompanying a patient to their appointment will only be allowed in the ultrasound room at the discretion of the ultrasound technician under special circumstances. We apologize for any inconvenience.   Follow-Up: At [facility], you and your health needs are our priority.  As part of our continuing mission to provide you with exceptional heart care, our providers are all part of one team.  This team includes your  primary Cardiologist (physician) and Advanced Practice Providers or APPs (Physician Assistants and Nurse Practitioners) who all work together to provide you with the care you need, when you need it.  Your next appointment:   1st available with Dr. Jordan to discuss CTO   6 month(s)  Provider:   Dorn Lesches, MD    We recommend signing up for the patient portal called MyChart.  Sign up information is provided on this After Visit Summary.  MyChart is used to connect with patients for Virtual Visits (Telemedicine).  Patients are able to view lab/test results, encounter notes, upcoming appointments, etc.  Non-urgent messages can be sent to your provider as well.   To learn more about what you can do with MyChart, go to [email or web address].   Other Instructions We will schedule you an appointment with a clinical PharmD to discuss weight loss management.

## 2024-11-10 NOTE — Assessment & Plan Note (Signed)
 History of CAD status post cath by Dr. Wonda 04/26/2014 revealing noncritical CAD.  I performed Myoview  stress testing 12/05/2016 revealing mild ischemia in the RCA territory.  I treated him medically at that time.  He was readmitted 12/26/2019 with non-STEMI and underwent cath by Dr. Mady revealing minimal LAD disease, 80% mid AV groove circumflex disease which was stented using a 2.5 mm x 18 mm long Medtronic resolute Onyx drug-eluting stent.  RCA was occluded with left-to-right collaterals and his EF was preserved.  Because of ongoing effort angina he underwent cath by Dr. Jordan 09/05/2023 revealing normal LV systolic function, and occluded RCA which is chronic with left-to-right collaterals, patent AV groove circumflex stent with proximal LAD disease which was stented with a 3 mm x 12 mm long Synergy drug-eluting stent.  His symptoms did improve at that time but currently he is going through a bottle of nitroglycerin  a month on high-dose Imdur .  I am referring him back back to Dr. Jordan for consideration of CTO intervention.

## 2024-11-10 NOTE — Assessment & Plan Note (Signed)
 Last echo performed 8//22 revealed EF of 45 to 50% with grade 1 diastolic dysfunction.  Will repeat a 2D echocardiogram.

## 2024-11-10 NOTE — Assessment & Plan Note (Signed)
 History of pulmonary embolus on Eliquis  oral anticoagulation chronically.

## 2024-11-11 ENCOUNTER — Ambulatory Visit: Payer: Self-pay | Admitting: Cardiovascular Disease

## 2024-11-11 LAB — LIPID PANEL
Chol/HDL Ratio: 3.4 ratio (ref 0.0–5.0)
Cholesterol, Total: 135 mg/dL (ref 100–199)
HDL: 40 mg/dL
LDL Chol Calc (NIH): 68 mg/dL (ref 0–99)
Triglycerides: 154 mg/dL — ABNORMAL HIGH (ref 0–149)
VLDL Cholesterol Cal: 27 mg/dL (ref 5–40)

## 2024-11-11 LAB — HEPATIC FUNCTION PANEL
ALT: 15 IU/L (ref 0–44)
AST: 16 IU/L (ref 0–40)
Albumin: 4.7 g/dL (ref 3.8–4.8)
Alkaline Phosphatase: 94 IU/L (ref 47–123)
Bilirubin Total: 0.4 mg/dL (ref 0.0–1.2)
Bilirubin, Direct: 0.14 mg/dL (ref 0.00–0.40)
Total Protein: 7.6 g/dL (ref 6.0–8.5)

## 2024-12-06 ENCOUNTER — Ambulatory Visit: Admitting: Cardiology

## 2024-12-06 ENCOUNTER — Ambulatory Visit: Admission: RE | Admit: 2024-12-06 | Source: Ambulatory Visit

## 2024-12-16 ENCOUNTER — Ambulatory Visit: Admitting: Pharmacist

## 2025-02-08 ENCOUNTER — Ambulatory Visit
